# Patient Record
Sex: Female | Born: 1935
Health system: Southern US, Community
[De-identification: ages and names within clinical notes are randomized; demographics above are authoritative.]

## PROBLEM LIST (undated history)

## (undated) DIAGNOSIS — M79671 Pain in right foot: Secondary | ICD-10-CM

## (undated) DIAGNOSIS — M25511 Pain in right shoulder: Secondary | ICD-10-CM

## (undated) DIAGNOSIS — R413 Other amnesia: Secondary | ICD-10-CM

## (undated) DIAGNOSIS — H409 Unspecified glaucoma: Secondary | ICD-10-CM

## (undated) DIAGNOSIS — M129 Arthropathy, unspecified: Secondary | ICD-10-CM

## (undated) DIAGNOSIS — M1990 Unspecified osteoarthritis, unspecified site: Secondary | ICD-10-CM

## (undated) DIAGNOSIS — Z8781 Personal history of (healed) traumatic fracture: Secondary | ICD-10-CM

## (undated) DIAGNOSIS — S72009A Fracture of unspecified part of neck of unspecified femur, initial encounter for closed fracture: Secondary | ICD-10-CM

## (undated) DIAGNOSIS — R2689 Other abnormalities of gait and mobility: Secondary | ICD-10-CM

## (undated) DIAGNOSIS — R51 Headache: Secondary | ICD-10-CM

## (undated) DIAGNOSIS — M810 Age-related osteoporosis without current pathological fracture: Secondary | ICD-10-CM

## (undated) DIAGNOSIS — K759 Inflammatory liver disease, unspecified: Secondary | ICD-10-CM

## (undated) DIAGNOSIS — N950 Postmenopausal bleeding: Secondary | ICD-10-CM

## (undated) DIAGNOSIS — N181 Chronic kidney disease, stage 1: Secondary | ICD-10-CM

## (undated) DIAGNOSIS — R251 Tremor, unspecified: Secondary | ICD-10-CM

## (undated) DIAGNOSIS — C801 Malignant (primary) neoplasm, unspecified: Secondary | ICD-10-CM

## (undated) DIAGNOSIS — S72001S Fracture of unspecified part of neck of right femur, sequela: Secondary | ICD-10-CM

## (undated) DIAGNOSIS — M25562 Pain in left knee: Secondary | ICD-10-CM

## (undated) DIAGNOSIS — R296 Repeated falls: Secondary | ICD-10-CM

## (undated) HISTORY — DX: Postmenopausal bleeding: N95.0

## (undated) HISTORY — DX: Pain in left knee: M25.562

## (undated) HISTORY — DX: Other amnesia: R41.3

## (undated) HISTORY — DX: Personal history of (healed) traumatic fracture: Z87.81

## (undated) HISTORY — DX: Fracture of unspecified part of neck of right femur, sequela: S72.001S

## (undated) HISTORY — DX: Other abnormalities of gait and mobility: R26.89

## (undated) HISTORY — DX: Age-related osteoporosis without current pathological fracture: M81.0

## (undated) HISTORY — DX: Repeated falls: R29.6

## (undated) HISTORY — DX: Inflammatory liver disease, unspecified: K75.9

## (undated) HISTORY — DX: Fracture of unspecified part of neck of unspecified femur, initial encounter for closed fracture: S72.009A

## (undated) HISTORY — DX: Unspecified glaucoma: H40.9

## (undated) HISTORY — DX: Arthropathy, unspecified: M12.9

## (undated) HISTORY — DX: Unspecified osteoarthritis, unspecified site: M19.90

## (undated) HISTORY — DX: Chronic kidney disease, stage 1: N18.1

## (undated) HISTORY — DX: Pain in right foot: M79.671

## (undated) HISTORY — DX: Headache: R51

## (undated) HISTORY — PX: HIP FRACTURE SURGERY: SHX118

## (undated) HISTORY — PX: DILATION AND CURETTAGE OF UTERUS: SHX78

## (undated) HISTORY — DX: Pain in right shoulder: M25.511

## (undated) HISTORY — DX: Tremor, unspecified: R25.1

## (undated) HISTORY — PX: ABDOMINAL HYSTERECTOMY: SHX81

---

## 2003-12-02 ENCOUNTER — Ambulatory Visit (HOSPITAL_COMMUNITY): Admission: RE | Admit: 2003-12-02 | Discharge: 2003-12-02 | Payer: Self-pay | Admitting: Pulmonary Disease

## 2003-12-09 ENCOUNTER — Ambulatory Visit (HOSPITAL_COMMUNITY): Admission: RE | Admit: 2003-12-09 | Discharge: 2003-12-09 | Payer: Self-pay | Admitting: Pulmonary Disease

## 2005-02-09 ENCOUNTER — Ambulatory Visit (HOSPITAL_COMMUNITY): Admission: RE | Admit: 2005-02-09 | Discharge: 2005-02-09 | Payer: Self-pay | Admitting: Internal Medicine

## 2005-05-07 ENCOUNTER — Encounter: Payer: Self-pay | Admitting: Obstetrics and Gynecology

## 2005-05-07 ENCOUNTER — Inpatient Hospital Stay (HOSPITAL_COMMUNITY): Admission: RE | Admit: 2005-05-07 | Discharge: 2005-05-09 | Payer: Self-pay | Admitting: Obstetrics and Gynecology

## 2005-09-10 HISTORY — PX: ABDOMINAL HYSTERECTOMY: SHX81

## 2006-04-22 ENCOUNTER — Ambulatory Visit (HOSPITAL_COMMUNITY): Admission: RE | Admit: 2006-04-22 | Discharge: 2006-04-22 | Payer: Self-pay | Admitting: Obstetrics and Gynecology

## 2006-07-31 ENCOUNTER — Ambulatory Visit (HOSPITAL_COMMUNITY): Admission: RE | Admit: 2006-07-31 | Discharge: 2006-07-31 | Payer: Self-pay | Admitting: Internal Medicine

## 2006-07-31 ENCOUNTER — Ambulatory Visit: Payer: Self-pay | Admitting: Internal Medicine

## 2006-07-31 HISTORY — PX: COLONOSCOPY: SHX174

## 2006-07-31 LAB — HM COLONOSCOPY

## 2006-11-28 ENCOUNTER — Ambulatory Visit (HOSPITAL_COMMUNITY): Admission: RE | Admit: 2006-11-28 | Discharge: 2006-11-28 | Payer: Self-pay | Admitting: Ophthalmology

## 2007-02-12 ENCOUNTER — Ambulatory Visit (HOSPITAL_COMMUNITY): Admission: RE | Admit: 2007-02-12 | Discharge: 2007-02-12 | Payer: Self-pay | Admitting: Pulmonary Disease

## 2007-02-14 ENCOUNTER — Ambulatory Visit: Payer: Self-pay | Admitting: Internal Medicine

## 2007-10-08 ENCOUNTER — Ambulatory Visit (HOSPITAL_COMMUNITY): Admission: RE | Admit: 2007-10-08 | Discharge: 2007-10-08 | Payer: Self-pay | Admitting: Obstetrics and Gynecology

## 2008-10-05 ENCOUNTER — Other Ambulatory Visit: Admission: RE | Admit: 2008-10-05 | Discharge: 2008-10-05 | Payer: Self-pay | Admitting: Obstetrics and Gynecology

## 2009-08-11 ENCOUNTER — Ambulatory Visit (HOSPITAL_COMMUNITY): Admission: RE | Admit: 2009-08-11 | Discharge: 2009-08-11 | Payer: Self-pay | Admitting: Ophthalmology

## 2009-10-19 ENCOUNTER — Other Ambulatory Visit: Admission: RE | Admit: 2009-10-19 | Discharge: 2009-10-19 | Payer: Self-pay | Admitting: Obstetrics and Gynecology

## 2010-11-08 ENCOUNTER — Other Ambulatory Visit: Payer: Self-pay | Admitting: Obstetrics and Gynecology

## 2010-11-08 DIAGNOSIS — N6459 Other signs and symptoms in breast: Secondary | ICD-10-CM

## 2010-11-08 DIAGNOSIS — Z124 Encounter for screening for malignant neoplasm of cervix: Secondary | ICD-10-CM | POA: Insufficient documentation

## 2010-11-15 ENCOUNTER — Ambulatory Visit (HOSPITAL_COMMUNITY)
Admission: RE | Admit: 2010-11-15 | Discharge: 2010-11-15 | Disposition: A | Discharge status: Home / Self Care | Payer: Medicare Other | Source: Ambulatory Visit | Attending: Obstetrics and Gynecology | Admitting: Obstetrics and Gynecology

## 2010-11-15 ENCOUNTER — Other Ambulatory Visit: Payer: Self-pay | Admitting: Obstetrics and Gynecology

## 2010-11-15 DIAGNOSIS — N6459 Other signs and symptoms in breast: Secondary | ICD-10-CM

## 2010-11-15 DIAGNOSIS — N63 Unspecified lump in unspecified breast: Secondary | ICD-10-CM | POA: Insufficient documentation

## 2010-11-20 ENCOUNTER — Other Ambulatory Visit (HOSPITAL_COMMUNITY)
Admission: RE | Admit: 2010-11-20 | Discharge: 2010-11-20 | Disposition: A | Discharge status: Home / Self Care | Payer: Medicare Other | Source: Ambulatory Visit | Attending: Obstetrics and Gynecology | Admitting: Obstetrics and Gynecology

## 2010-12-08 ENCOUNTER — Ambulatory Visit (HOSPITAL_COMMUNITY)
Admission: RE | Admit: 2010-12-08 | Discharge: 2010-12-08 | Disposition: A | Discharge status: Home / Self Care | Payer: Medicare Other | Source: Ambulatory Visit | Attending: Pulmonary Disease | Admitting: Pulmonary Disease

## 2010-12-08 ENCOUNTER — Other Ambulatory Visit (HOSPITAL_COMMUNITY): Payer: Self-pay | Admitting: Pulmonary Disease

## 2010-12-08 DIAGNOSIS — M25569 Pain in unspecified knee: Secondary | ICD-10-CM | POA: Insufficient documentation

## 2010-12-08 DIAGNOSIS — M25561 Pain in right knee: Secondary | ICD-10-CM

## 2010-12-08 DIAGNOSIS — M25562 Pain in left knee: Secondary | ICD-10-CM

## 2010-12-08 DIAGNOSIS — M25469 Effusion, unspecified knee: Secondary | ICD-10-CM | POA: Insufficient documentation

## 2010-12-13 LAB — BASIC METABOLIC PANEL
BUN: 10 mg/dL (ref 6–23)
CO2: 31 mEq/L (ref 19–32)
Calcium: 10.1 mg/dL (ref 8.4–10.5)
Chloride: 107 mEq/L (ref 96–112)
Creatinine, Ser: 0.77 mg/dL (ref 0.4–1.2)
GFR calc Af Amer: >60 mL/min (ref >60)
GFR calc non Af Amer: >60 mL/min (ref >60)
Glucose, Bld: 86 mg/dL (ref 70–99)
Potassium: 3.9 mEq/L (ref 3.5–5.1)
Sodium: 143 mEq/L (ref 135–145)

## 2010-12-13 LAB — HEMOGLOBIN AND HEMATOCRIT, BLOOD
HCT: 43 % (ref 36.0–46.0)
Hemoglobin: 14.6 g/dL (ref 12.0–15.0)

## 2011-01-26 NOTE — Discharge Summary (Signed)
[name], Brittany Mahoney NO.:  [id number]   MEDICAL RECORD NO.:  [id number]          PATIENT TYPE:  INP   LOCATION:  A426                           FACILITY:   PHYSICIAN:  Tilda Burrow, M.D. DATE OF BIRTH:  1934/11/20   DATE OF ADMISSION:  05/07/2005  DATE OF DISCHARGE:  08/30/2006LH                                 DISCHARGE SUMMARY   ADMITTING DIAGNOSIS:  Well-differentiated adenocarcinoma of the endometrium,  FIGO grade I/III   DISCHARGE DIAGNOSIS:  Well-differentiated adenocarcinoma of the endometrium,  FIGO grade I/III, final pathology pending.   PROCEDURE:  Total abdominal hysterectomy, bilateral salpingo-oophorectomy,  pelvic washings performed on May 07, 2005.   DISCHARGE MEDICATIONS:  Aleve 1 every 6 hours p.r.n. discomfort.   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  See HPI for details.   HOSPITAL COURSE:  The patient was admitted, underwent TAH/BSO with 250 mL  blood loss.  Preop hemoglobin was 14.2, hematocrit 40.8, white count 7000.  Postop hemoglobin was 13, hematocrit 37.2.  She was afebrile.  She required  minimal analgesics, had a regular diet tolerated on the first postop day and  was discharged on the second postoperative day with final pathology pending  and postop visit May 15, 2005 for an incision check.  Addendum: Pathology shows 2 MM of cancer on the surface of the myometrium at  a uterine wall thickness of 1.2 CM (innner 1/3 only).      Tilda Burrow, M.D.  Electronically Signed     JVF/MEDQ  D:  05/09/2005  T:  05/09/2005  Job:  [id number]

## 2011-01-26 NOTE — Op Note (Signed)
Brittany Mahoney, Brittany Mahoney              ACCOUNT NO.:  [id number]   MEDICAL RECORD NO.:  [id number]          PATIENT TYPE:  INP   LOCATION:  A426                          FACILITY:  [facility]   PHYSICIAN:  Tilda Burrow, M.D. DATE OF BIRTH:  06/05/1935   DATE OF PROCEDURE:  05/07/2005  DATE OF DISCHARGE:                                 OPERATIVE REPORT   PREOPERATIVE DIAGNOSIS:  Endometrial carcinoma, FIGO grade 1.   POSTOPERATIVE DIAGNOSES:  1.  Endometrial carcinoma, FIGO grade 1.  2.  Old endometriosis.  3.  Pelvic adhesions.   PROCEDURE:  Total abdominal hysterectomy with bilateral salpingo-  oophorectomy, pelvic washings.   SURGEON:  Tilda Burrow, M.D.   ASSISTANTMarlinda Mike, R.N.   ANESTHESIA:  General, Garner Nash, C.R.N.A.   COMPLICATIONS:  None.   FINDINGS:  Dense adherence of the right ovary to the right pelvic side wall,  just adjacent to the ureter. Sigmoid adhesion in the cul-de-sac to the right  side of the midline. Right ovarian chocolate cyst consistent with old burned  out endometriosis. Normal pelvic structures. Otherwise, no evidence of  nodularity or suspension of metastatic disease.   DETAILS OF PROCEDURE:  The patient was taken to the operating room, prepped  and draped in the usual fashion for lower abdominal surgery with midline  vertical incision performed. We excised approximately a 2-inch wide ellipse  of skin and fatty tissue to optimize incision reapproximation. Peritoneal  cavity was entered through the midline incision with fascia elevated and  split with Mayo scissors, preperitoneal fat identified, peritoneal grasped  with two Hemostats and opened carefully without suspicion of injury to  internal organs. Peritoneal cavity was opened sufficiently to do pelvic  washings. This was followed by packing the bowel away and inspection of the  pelvis. Palpation of the upper abdomen had been performed and showed normal  liver surfaces, smooth omentum, with a  couple of old stringy bits of  fibrosis suggesting old infection, with no nodularity or suspicion of tumor  nodule. Bowel was grossly normal to surface appearances. Attention was then  directed to the pelvis and the round ligaments were taken down bilaterally.  Inspection of the cul-de-sac showed the cul-de-sac to be essentially  obliterated with a combination of sigmoid adhesions to the right side and  the right ovary having what appeared to be a burned out endometrioma and  adhesions to the side wide. We entered the retroperitoneum on the left,  isolated the infundibulopelvic ligament, palpated the ureter by banjo-string  technique, and stayed away from it while taking down the IP ligament.  Uterine vessels were able to be skeletonized and clamped with curved Heaney  clamp, back clamped with Kelly clamp, transected and suture ligated. The  bladder flap was developed anteriorly and attention directed to the right  side. Upon entering the retroperitoneum on the right, we were able to  palpate the ureter, and it was pulled up toward the attachment of the ovary  to the side wall. We therefore used Kidner dissectors to carefully peel the  ovary off of the side wall, and it went  quite well. The distance from the  ureter was greater than 1 cm at that point, so we were able to isolate the  IP ligament, clamp, cut, and suture ligate it. The curved Heaney clamp was  placed across the uterine vessels on this side with transection and suture  ligation. The sigmoid colon was adherent up to the backside of the inferior  aspects of the cardinal ligaments. We had to take this down, being careful  to not disrupt the surface of bowel, and this was successful. We were able  to clamp, cut, and suture ligate down to the upper and lower cardinal  ligaments on this side as well. At this time, we could palpate that we were  free enough on the bladder flap to make an anterior stab incision in the  cervical  vaginal fornix and amputate the cervix off of the vaginal cuff.  Aldrich stitches were placed at each lateral vaginal angle, the sigmoid  colon inspected, and no suspicion of disruption was noted. Cuff was closed  using a series of interrupted sutures front to back, with good tissue edge  approximation; we required an additional suture or two for hemostasis as the  cuff had a tendency to ooze just to the right of the midline. The cuff was  irrigated and inspected and found satisfactorily hemostatic. Laparotomy  equipment was removed, and sponge and tapes removed, anterior peritoneum  closed using 2-0 chromic. The fascia closed with continuous running PDS and  subcu tissue was reapproximated with interrupted 2-0 plain followed by  staple closure of the skin with good tissue edge approximation. Sponge and  needle counts were correct throughout.      Tilda Burrow, M.D.  Electronically Signed     JVF/MEDQ  D:  05/07/2005  T:  05/08/2005  Job:  [id number]

## 2011-01-26 NOTE — Procedures (Signed)
NAME:  Brittany Mahoney, Brittany Mahoney                           ACCOUNT NO.:  [id number]   MEDICAL RECORD NO.:  [id number]                   PATIENT TYPE:  OUT   LOCATION:  RAD                                  FACILITY:  [facility]   PHYSICIAN:  Railroad Bing, M.D.               DATE OF BIRTH:  [birth year]   DATE OF PROCEDURE:  DATE OF DISCHARGE:                                  ECHOCARDIOGRAM   REFERRING PHYSICIAN:  Dr. Ramon Dredge L. Hawkins.   CLINICAL DATA:  A 76 year old patient with signs and symptoms of congestive  heart failure.   M-MODE:  1. Aorta 3.3.  2. Left atrium 3.4.  3. Septum 1.4.  4. Posterior wall 1.4.  5. LV diastole 4.1.  6. LV systole 2.4.   FINDINGS:  1. Technically difficult and somewhat suboptimal echocardiographic study.  2. Left atrial size is at the upper limits of normal; normal right atrial     size.  3. Normal right ventricular size and function.  4. Normal tricuspid and mitral valve; mild mitral annular calcification.  5. Aortic valve is not well imaged; probably normal.  6. Normal inferior vena cava.  7. Normal internal dimension of the left ventricle; mild concentric left     ventricular hypertrophy.  8. Normal regional and global function.      ___________________________________________                                             Bing, M.D.   RR/MEDQ  D:  12/02/2003  T:  12/02/2003  Job:  [id number]

## 2011-01-26 NOTE — Op Note (Signed)
NAME:  Brittany Mahoney, Brittany Mahoney                 ACCOUNT NO.:  [id number]   MEDICAL RECORD NO.:  [id number]          PATIENT TYPE:  AMB   LOCATION:  DAY                           FACILITY:  [facility]   PHYSICIAN:  R. Roetta Sessions, M.D. DATE OF BIRTH:  27-Jun-1935   DATE OF PROCEDURE:  07/31/2006  DATE OF DISCHARGE:                                 OPERATIVE REPORT   PROCEDURE:  Screening colonoscopy.   INDICATIONS FOR PROCEDURE:  Patient is a 76 year old lady devoid of any  lower GI tract symptoms sent over courtesy of Dr. Christin Bach for  colorectal cancer screening.  She had colonoscopy some five or seven years  ago without significant findings (those records not available at this time).  However, just last year she underwent hysterectomy and was found to have  uterine carcinoma.  She had limited stage disease, not requiring any  additional therapy.  She is here as a high risk screen.  This approach has  discussed with Ms. Baum, potential risks, benefits and alternatives have  been reviewed, questions answered.  She is agreeable.  Please see  documentation in the medical record.   PROCEDURE NOTE:  O2 saturation, blood pressure, pulse and monitor the entire  procedure.   CONSCIOUS SEDATION:  Versed 3 mg IV, Demerol 75 mg IV   INSTRUMENT:  Olympus video chip system.   FINDINGS:  Digital rectal exam revealed no abnormalities.   ENDOSCOPIC FINDINGS:  The prep was good.  Rectum:  The colonoscope was  advanced from the rectosigmoid junction into the colon through the left,  transverse, right colon to area of appendiceal orifice, ileocecal valve and  cecum.  These structures well seen and photographed for the record.  From  this level, the scope was withdrawn.  Previously mentioned mucosal surfaces  were again seen.  A thorough examination of the rectal mucosa was undertaken  including a retroflex view of the anal verge.  The rectal mucosa appeared  normal.  The patient had extensive left-sided  diverticula.  Remainder of  colonic mucosa appeared normal, however.  The patient tolerated the  procedure well, was reacted in endoscopy.   IMPRESSION:  Normal rectum, left-sided diverticula.  Remainder colonic  mucosa appeared normal.   RECOMMENDATIONS:  1. Diverticulosis literature provided to Ms. Stanhope.  2. Repeat screening colonoscopy 5 years.      Jonathon Bellows, M.D.  Electronically Signed     RMR/MEDQ  D:  07/31/2006  T:  07/31/2006  Job:  [id number]   cc:   Tilda Burrow, M.D.  Fax: [phone]   Oneal Deputy. Juanetta Gosling, M.D.  Fax: [phone]

## 2011-01-26 NOTE — H&P (Signed)
Brittany Mahoney, Brittany Mahoney              ACCOUNT NO.:  [id number]   MEDICAL RECORD NO.:  [id number]          PATIENT TYPE:  AMB   LOCATION:  DAY                           FACILITY:  [facility]   PHYSICIAN:  Tilda Burrow, M.D. DATE OF BIRTH:  04-25-1935   DATE OF ADMISSION:  DATE OF DISCHARGE:  LH                                HISTORY & PHYSICAL   ADMISSION DIAGNOSIS:  Well-differentiated adenocarcinoma of the endometrium,  FIGO Grade I of III.   HISTORY OF PRESENT ILLNESS:  This 75 year old female employee of [employer] is admitted at this time for TAH-BSO and pelvic washings.  This is  read out as Grade I by Dr. Sherryll Burger in [place].  The case has been discussed  with Dr. De Blanch of [place]. As per Ms. Bergstresser's request,  the cancer surgery has been delayed.  In the interim, she has been managed  on Megace 40 mg t.i.d.  Plans are to proceed with hysterectomy, removal of  tubes and ovaries, and pelvic washings.   This evaluation began with postmenopausal bleeding noted in June where  ultrasound showed a slightly thickened, irregular endometrial layer and a  small 3.5 cm simple cystic mass in the right ovary with a normal CA125-20.  Bowel prep was planned for the surgery in case abnormal lesion or malignancy  of the ovary is identified.   PAST MEDICAL HISTORY:  Essentially benign other than migraines with her  periods years ago.   PAST SURGICAL HISTORY:  D&C in 1965 for GYN concerns.   ALLERGIES:  CODEINE, PENICILLIN, and MORPHINE SULFATE.   REVIEW OF SYSTEMS:  Negative for headaches, blurred vision, shortness of  breath, and chest pain.  She has normal bowel function.  She has mild urge  incontinence at times.   FAMILY HISTORY:  Positive for stroke, cancer.  Her father died of lymphatic  sarcoma.   PHYSICAL EXAMINATION:  VITAL SIGNS:  Height 5 feet 7-1/2 inches, weight 181.  Blood pressure 118/68.  Hemoglobin 14.2.  GENERAL:  She is a healthy-appearing  Caucasian female appropriate for age.  HEENT:  Pupils equal, round, and reactive.  Icteric.  Extraocular movements  intact.  NECK:  Supple.  CHEST:  Clear to auscultation.  ABDOMEN:  Soft, nontender, without mass or ascites.  PELVIC:  External genitalia normal for patient age with atrophic vaginal  tissue.  Pap smear recently Class I Feb 05, 2005.  The uterus was  anteflexed, 7 cm in maximum diameter on recent ultrasound.  Adnexa shows 3.5  cm simple cystic mass on the right ovary.   PLAN:  Total abdominal hysterectomy and bilateral salpingo-oophorectomy with  pelvic washings Monday, May 07, 2005. Frozen section on ovary.   The pros and cons of surgery have been discussed with patient with patient  having previously been made aware of my consultation with Dr. Katheren Shams-  Sharol Given, offered consultation with him prior to surgery or referral for  surgery.  She is comfortable with [place] to do this here.  Will proceed on  May 07, 2005.      Tilda Burrow, M.D.  Electronically Signed     JVF/MEDQ  D:  05/06/2005  T:  05/06/2005  Job:  [id number]

## 2011-06-11 ENCOUNTER — Encounter: Payer: Self-pay | Admitting: Internal Medicine

## 2011-09-19 ENCOUNTER — Other Ambulatory Visit (HOSPITAL_COMMUNITY): Payer: Self-pay | Admitting: Pulmonary Disease

## 2011-09-19 DIAGNOSIS — M545 Low back pain, unspecified: Secondary | ICD-10-CM | POA: Diagnosis not present

## 2011-09-19 DIAGNOSIS — M898X9 Other specified disorders of bone, unspecified site: Secondary | ICD-10-CM

## 2011-09-19 DIAGNOSIS — M79609 Pain in unspecified limb: Secondary | ICD-10-CM | POA: Diagnosis not present

## 2011-09-19 DIAGNOSIS — IMO0001 Reserved for inherently not codable concepts without codable children: Secondary | ICD-10-CM | POA: Diagnosis not present

## 2011-09-20 ENCOUNTER — Encounter (HOSPITAL_COMMUNITY)
Admission: RE | Admit: 2011-09-20 | Discharge: 2011-09-20 | Disposition: A | Discharge status: Home / Self Care | Payer: Medicare Other | Source: Ambulatory Visit | Attending: Pulmonary Disease | Admitting: Pulmonary Disease

## 2011-09-20 ENCOUNTER — Encounter (HOSPITAL_COMMUNITY): Payer: Self-pay

## 2011-09-20 DIAGNOSIS — R937 Abnormal findings on diagnostic imaging of other parts of musculoskeletal system: Secondary | ICD-10-CM | POA: Insufficient documentation

## 2011-09-20 DIAGNOSIS — M898X9 Other specified disorders of bone, unspecified site: Secondary | ICD-10-CM

## 2011-09-20 DIAGNOSIS — M1710 Unilateral primary osteoarthritis, unspecified knee: Secondary | ICD-10-CM | POA: Diagnosis not present

## 2011-09-20 DIAGNOSIS — IMO0002 Reserved for concepts with insufficient information to code with codable children: Secondary | ICD-10-CM | POA: Diagnosis not present

## 2011-09-20 DIAGNOSIS — M25569 Pain in unspecified knee: Secondary | ICD-10-CM | POA: Insufficient documentation

## 2011-09-20 DIAGNOSIS — H9209 Otalgia, unspecified ear: Secondary | ICD-10-CM | POA: Insufficient documentation

## 2011-09-20 HISTORY — DX: Malignant (primary) neoplasm, unspecified: C80.1

## 2011-09-20 MED ORDER — TECHNETIUM TC 99M MEDRONATE IV KIT
25.0000 | PACK | Freq: Once | INTRAVENOUS | Status: AC | PRN
  Administered 2011-09-20: 24 via INTRAVENOUS

## 2011-12-03 ENCOUNTER — Other Ambulatory Visit: Payer: Self-pay | Admitting: Obstetrics and Gynecology

## 2011-12-03 DIAGNOSIS — Z139 Encounter for screening, unspecified: Secondary | ICD-10-CM

## 2011-12-04 ENCOUNTER — Ambulatory Visit (HOSPITAL_COMMUNITY)
Admission: RE | Admit: 2011-12-04 | Discharge: 2011-12-04 | Disposition: A | Discharge status: Home / Self Care | Payer: Medicare Other | Source: Ambulatory Visit | Attending: Obstetrics and Gynecology | Admitting: Obstetrics and Gynecology

## 2011-12-04 DIAGNOSIS — Z1231 Encounter for screening mammogram for malignant neoplasm of breast: Secondary | ICD-10-CM | POA: Diagnosis not present

## 2011-12-04 DIAGNOSIS — Z139 Encounter for screening, unspecified: Secondary | ICD-10-CM

## 2012-01-09 DIAGNOSIS — H4011X0 Primary open-angle glaucoma, stage unspecified: Secondary | ICD-10-CM | POA: Diagnosis not present

## 2012-02-25 ENCOUNTER — Other Ambulatory Visit: Payer: Self-pay | Admitting: Obstetrics and Gynecology

## 2012-02-25 ENCOUNTER — Other Ambulatory Visit (HOSPITAL_COMMUNITY)
Admission: RE | Admit: 2012-02-25 | Discharge: 2012-02-25 | Disposition: A | Discharge status: Home / Self Care | Payer: Medicare Other | Source: Ambulatory Visit | Attending: Obstetrics and Gynecology | Admitting: Obstetrics and Gynecology

## 2012-02-25 DIAGNOSIS — Z1212 Encounter for screening for malignant neoplasm of rectum: Secondary | ICD-10-CM | POA: Diagnosis not present

## 2012-02-25 DIAGNOSIS — R319 Hematuria, unspecified: Secondary | ICD-10-CM | POA: Diagnosis not present

## 2012-02-25 DIAGNOSIS — Z124 Encounter for screening for malignant neoplasm of cervix: Secondary | ICD-10-CM | POA: Diagnosis not present

## 2012-06-05 DIAGNOSIS — Z23 Encounter for immunization: Secondary | ICD-10-CM | POA: Diagnosis not present

## 2012-06-13 DIAGNOSIS — H524 Presbyopia: Secondary | ICD-10-CM | POA: Diagnosis not present

## 2012-06-13 DIAGNOSIS — H4011X0 Primary open-angle glaucoma, stage unspecified: Secondary | ICD-10-CM | POA: Diagnosis not present

## 2012-06-13 DIAGNOSIS — Z0100 Encounter for examination of eyes and vision without abnormal findings: Secondary | ICD-10-CM | POA: Diagnosis not present

## 2012-06-13 DIAGNOSIS — H52229 Regular astigmatism, unspecified eye: Secondary | ICD-10-CM | POA: Diagnosis not present

## 2012-07-14 DIAGNOSIS — H4011X0 Primary open-angle glaucoma, stage unspecified: Secondary | ICD-10-CM | POA: Diagnosis not present

## 2012-08-12 DIAGNOSIS — H4011X0 Primary open-angle glaucoma, stage unspecified: Secondary | ICD-10-CM | POA: Diagnosis not present

## 2012-12-16 DIAGNOSIS — H4011X0 Primary open-angle glaucoma, stage unspecified: Secondary | ICD-10-CM | POA: Diagnosis not present

## 2013-01-19 ENCOUNTER — Other Ambulatory Visit: Payer: Self-pay | Admitting: Obstetrics and Gynecology

## 2013-01-19 DIAGNOSIS — Z139 Encounter for screening, unspecified: Secondary | ICD-10-CM

## 2013-01-23 ENCOUNTER — Ambulatory Visit (HOSPITAL_COMMUNITY)
Admission: RE | Admit: 2013-01-23 | Discharge: 2013-01-23 | Disposition: A | Discharge status: Home / Self Care | Payer: Medicare Other | Source: Ambulatory Visit | Attending: Obstetrics and Gynecology | Admitting: Obstetrics and Gynecology

## 2013-01-23 DIAGNOSIS — Z1231 Encounter for screening mammogram for malignant neoplasm of breast: Secondary | ICD-10-CM | POA: Diagnosis not present

## 2013-01-23 DIAGNOSIS — Z139 Encounter for screening, unspecified: Secondary | ICD-10-CM

## 2013-02-26 ENCOUNTER — Other Ambulatory Visit: Payer: Self-pay | Admitting: Obstetrics and Gynecology

## 2013-03-05 ENCOUNTER — Encounter: Payer: Self-pay | Admitting: *Deleted

## 2013-03-06 ENCOUNTER — Encounter: Payer: Self-pay | Admitting: Obstetrics and Gynecology

## 2013-03-06 ENCOUNTER — Other Ambulatory Visit (HOSPITAL_COMMUNITY)
Admission: RE | Admit: 2013-03-06 | Discharge: 2013-03-06 | Disposition: A | Discharge status: Home / Self Care | Payer: Medicare Other | Source: Ambulatory Visit | Attending: Obstetrics and Gynecology | Admitting: Obstetrics and Gynecology

## 2013-03-06 ENCOUNTER — Ambulatory Visit (INDEPENDENT_AMBULATORY_CARE_PROVIDER_SITE_OTHER): Payer: Medicare Other | Admitting: Obstetrics and Gynecology

## 2013-03-06 VITALS — BP 136/82 | Ht 66.0 in | Wt 170.6 lb

## 2013-03-06 DIAGNOSIS — Z1151 Encounter for screening for human papillomavirus (HPV): Secondary | ICD-10-CM | POA: Diagnosis not present

## 2013-03-06 DIAGNOSIS — Z1212 Encounter for screening for malignant neoplasm of rectum: Secondary | ICD-10-CM

## 2013-03-06 DIAGNOSIS — Z8542 Personal history of malignant neoplasm of other parts of uterus: Secondary | ICD-10-CM | POA: Diagnosis not present

## 2013-03-06 DIAGNOSIS — Z90710 Acquired absence of both cervix and uterus: Secondary | ICD-10-CM

## 2013-03-06 DIAGNOSIS — Z01419 Encounter for gynecological examination (general) (routine) without abnormal findings: Secondary | ICD-10-CM | POA: Insufficient documentation

## 2013-03-06 DIAGNOSIS — Z124 Encounter for screening for malignant neoplasm of cervix: Secondary | ICD-10-CM

## 2013-03-06 HISTORY — DX: Personal history of malignant neoplasm of other parts of uterus: Z85.42

## 2013-03-06 NOTE — Patient Instructions (Addendum)
ANNUAL VISITS ARE ALL THAT IS NEEDED

## 2013-03-06 NOTE — Progress Notes (Signed)
Patient ID: NEDA WILLENBRING, female   DOB: 1935-07-27, 78 y.o.   MRN: [id number] Pt here today for Annual Physical. Pt denies any problems at this time.   Subjective:     DONIESHA LANDAU is a 78 y.o. female here for a routine exam.  Current complaints: .  Personal health questionnaire reviewed: no.   Gynecologic History No LMP recorded. Patient has had a hysterectomy. Contraception: post menopausal status Last Pap: ANNUAL. Results were: normal Last mammogram: 2013 . Results were: normal  Obstetric History OB History   Grav Para Term Preterm Abortions TAB SAB Ect Mult Living   3 2   1  1         # Outc Date GA Lbr Len/2nd Wgt Sex Del Anes PTL Lv   1 PAR            2 PAR            3 SAB                The following portions of the patient's history were reviewed and updated as appropriate: allergies, current medications, past family history, past medical history, past social history, past surgical history and problem list.  Review of Systems  Review of Systems  Constitutional: Negative for fever, chills, weight loss, malaise/fatigue and diaphoresis.  HENT: Negative for hearing loss, ear pain, nosebleeds, congestion, sore throat, neck pain, tinnitus and ear discharge.   Eyes: Negative for blurred vision, double vision, photophobia, pain, discharge and redness.  Respiratory: Negative for cough, hemoptysis, sputum production, shortness of breath, wheezing and stridor.   Cardiovascular: Negative for chest pain, palpitations, orthopnea, claudication, leg swelling and PND.  Gastrointestinal: negative for abdominal pain. Negative for heartburn, nausea, vomiting, diarrhea, constipation, blood in stool and melena.  Genitourinary: Negative for dysuria, urgency, frequency, hematuria and flank pain.  Musculoskeletal: Negative for myalgias, back pain, joint pain and falls.  Skin: Negative for itching and rash.  Neurological: Negative for dizziness, tingling, tremors, sensory change, speech change,  focal weakness, seizures, loss of consciousness, weakness and headaches.  Endo/Heme/Allergies: Negative for environmental allergies and polydipsia. Does not bruise/bleed easily.  Psychiatric/Behavioral: Negative for depression, suicidal ideas, hallucinations, memory loss and substance abuse. The patient is not nervous/anxious and does not have insomnia.        Objective:    Physical Exam  Vitals reviewed. Constitutional: She is oriented to person, place, and time. She appears well-developed and well-nourished.  HENT:  Head: Normocephalic and atraumatic.        Right Ear: External ear normal.  Left Ear: External ear normal.  Nose: Nose normal.  Mouth/Throat: Oropharynx is clear and moist.  Eyes: Conjunctivae and EOM are normal. Pupils are equal, round, and reactive to light. Right eye exhibits no discharge. Left eye exhibits no discharge. No scleral icterus.  Neck: Normal range of motion. Neck supple. No tracheal deviation present. No thyromegaly present.  Cardiovascular: Normal rate, regular rhythm, normal heart sounds and intact distal pulses.  Exam reveals no gallop and no friction rub.   No murmur heard. Respiratory: Effort normal and breath sounds normal. No respiratory distress. She has no wheezes. She has no rales. She exhibits no tenderness.  GI: Soft. Bowel sounds are normal. She exhibits no distension and no mass. There is no tenderness. There is no rebound and no guarding.  Genitourinary: atrophic c/w age good support      Vulva is normal without lesions Vagina is pink moist without discharge  Musculoskeletal: Normal  range of motion. She exhibits no edema and no tenderness.  Neurological: She is alert and oriented to person, place, and time. She has normal reflexes. She displays normal reflexes. No cranial nerve deficit. She exhibits normal muscle tone. Coordination normal.  Skin: Skin is warm and dry. No rash noted. No erythema. No pallor. several comedones opened on back has  had 2 AK spont resolve. Psychiatric: She has a normal mood and affect. Her behavior is normal. Judgment and thought content normal.       Assessment:    Healthy female exam.  s/p  IA GRADE I CARCINOMA OF ENDOMETRIUM WITH NEG FOLLOWUP TODATE   Plan:    Follow up in: 1 year.

## 2013-03-12 ENCOUNTER — Encounter: Payer: Self-pay | Admitting: Obstetrics and Gynecology

## 2013-05-26 ENCOUNTER — Other Ambulatory Visit (HOSPITAL_COMMUNITY): Payer: Self-pay | Admitting: Pulmonary Disease

## 2013-05-26 DIAGNOSIS — Z780 Asymptomatic menopausal state: Secondary | ICD-10-CM

## 2013-05-26 DIAGNOSIS — M1990 Unspecified osteoarthritis, unspecified site: Secondary | ICD-10-CM

## 2013-05-26 DIAGNOSIS — Z0000 Encounter for general adult medical examination without abnormal findings: Secondary | ICD-10-CM | POA: Diagnosis not present

## 2013-05-29 DIAGNOSIS — Z79899 Other long term (current) drug therapy: Secondary | ICD-10-CM | POA: Diagnosis not present

## 2013-05-29 DIAGNOSIS — Z0000 Encounter for general adult medical examination without abnormal findings: Secondary | ICD-10-CM | POA: Diagnosis not present

## 2013-05-29 DIAGNOSIS — M129 Arthropathy, unspecified: Secondary | ICD-10-CM | POA: Diagnosis not present

## 2013-06-01 ENCOUNTER — Other Ambulatory Visit (HOSPITAL_COMMUNITY): Payer: Medicare Other

## 2013-06-08 DIAGNOSIS — Z23 Encounter for immunization: Secondary | ICD-10-CM | POA: Diagnosis not present

## 2014-01-07 ENCOUNTER — Other Ambulatory Visit: Payer: Self-pay | Admitting: Obstetrics and Gynecology

## 2014-01-07 DIAGNOSIS — Z1231 Encounter for screening mammogram for malignant neoplasm of breast: Secondary | ICD-10-CM

## 2014-01-25 ENCOUNTER — Ambulatory Visit (HOSPITAL_COMMUNITY)
Admission: RE | Admit: 2014-01-25 | Discharge: 2014-01-25 | Disposition: A | Discharge status: Home / Self Care | Payer: Medicare Other | Source: Ambulatory Visit | Attending: Obstetrics and Gynecology | Admitting: Obstetrics and Gynecology

## 2014-01-25 DIAGNOSIS — Z1231 Encounter for screening mammogram for malignant neoplasm of breast: Secondary | ICD-10-CM | POA: Diagnosis not present

## 2014-03-08 ENCOUNTER — Other Ambulatory Visit: Payer: Medicare Other | Admitting: Obstetrics and Gynecology

## 2014-03-15 ENCOUNTER — Ambulatory Visit (INDEPENDENT_AMBULATORY_CARE_PROVIDER_SITE_OTHER): Payer: Medicare Other | Admitting: Obstetrics and Gynecology

## 2014-03-15 ENCOUNTER — Encounter: Payer: Self-pay | Admitting: Obstetrics and Gynecology

## 2014-03-15 ENCOUNTER — Other Ambulatory Visit (HOSPITAL_COMMUNITY)
Admission: RE | Admit: 2014-03-15 | Discharge: 2014-03-15 | Disposition: A | Discharge status: Home / Self Care | Payer: Medicare Other | Source: Ambulatory Visit | Attending: Obstetrics and Gynecology | Admitting: Obstetrics and Gynecology

## 2014-03-15 VITALS — BP 128/64 | Ht 66.0 in | Wt 160.0 lb

## 2014-03-15 DIAGNOSIS — Z1212 Encounter for screening for malignant neoplasm of rectum: Secondary | ICD-10-CM | POA: Diagnosis not present

## 2014-03-15 DIAGNOSIS — Z8542 Personal history of malignant neoplasm of other parts of uterus: Secondary | ICD-10-CM | POA: Diagnosis not present

## 2014-03-15 DIAGNOSIS — Z124 Encounter for screening for malignant neoplasm of cervix: Secondary | ICD-10-CM | POA: Insufficient documentation

## 2014-03-15 DIAGNOSIS — Z01419 Encounter for gynecological examination (general) (routine) without abnormal findings: Secondary | ICD-10-CM | POA: Insufficient documentation

## 2014-03-15 LAB — HEMOCCULT GUIAC POC 1CARD (OFFICE): Fecal Occult Blood, POC: NEGATIVE

## 2014-03-15 NOTE — Progress Notes (Signed)
This chart was scribed by Ludger Nutting, Medical Scribe, for Dr. Mallory Shirk on 03/15/14 at 9:53 AM. This chart was reviewed by Dr. Mallory Shirk for accuracy.   Assessment:  Annual Gyn Exam  Hx Endometrial cancer   Plan:  1. pap smear done, next pap due 1 year 2. return annually or prn 3    Annual mammogram advised, normal Jan 15 2014 Subjective:  Brittany Mahoney is a 79 y.o. female G3P0010 who presents for annual exam. No LMP recorded. Patient has had a hysterectomy. The patient has complaints today of fatigue and decreased energy.   Patient reports having 7-8 falls over the last 1 year. Patient states she turns too quickly, causing her to lose her balance and fall backwards. She denies any serious injuries as a result of the falls. Pt advised to keep dr Luan Pulling aware of these issues weight stable.  The following portions of the patient's history were reviewed and updated as appropriate: allergies, current medications, past family history, past medical history, past social history, past surgical history and problem list.  Review of Systems Constitutional: negative except for fatigue and skin irritation to back  Gastrointestinal: negative Genitourinary: negative   Objective:  BP 128/64  Ht 5\' 6"  (1.676 m)  Wt 160 lb (72.576 kg)  BMI 25.84 kg/m2   BMI: Body mass index is 25.84 kg/(m^2).  General Appearance: Alert, appropriate appearance for age. No acute distress HEENT: Grossly normal Neck / Thyroid:  Cardiovascular: RRR; normal S1, S2, no murmur Lungs: CTA bilaterally Back: Multiple hyperkeratoses, nothing suspicious; No CVAT Breast Exam: No dimpling, nipple retraction or discharge. No masses or nodes., Normal to inspection, Normal breast tissue bilaterally and No masses or nodes.No dimpling, nipple retraction or discharge. Gastrointestinal: Soft, non-tender, no masses or organomegaly Pelvic Exam: External genitalia: normal general appearance Vaginal: normal mucosa without prolapse  or lesions, normal without tenderness, induration or masses and atrophic mucosa; well supported vaginal tissue, slightly atrophic, Cervix: removed surgically Adnexa: removed surgically Uterus: removed surgically Rectal: good sphincter tone, no masses and guaiac negative,  Rectovaginal: normal rectal, no masses and guaiac negative stool obtained Lymphatic Exam: Non-palpable nodes in neck, clavicular, axillary, or inguinal regions  Skin: no rash or abnormalities Neurologic: Normal gait and speech, no tremor  Psychiatric: Alert and oriented, appropriate affect.  Urinalysis:Not done  Mallory Shirk. MD Pgr [phone] 9:53 AM

## 2014-03-16 LAB — CYTOLOGY - PAP

## 2014-03-19 ENCOUNTER — Telehealth: Payer: Self-pay | Admitting: Obstetrics and Gynecology

## 2014-03-19 NOTE — Telephone Encounter (Signed)
Left msg that pt pap is normal.

## 2014-04-14 DIAGNOSIS — H4011X0 Primary open-angle glaucoma, stage unspecified: Secondary | ICD-10-CM | POA: Diagnosis not present

## 2014-04-14 DIAGNOSIS — H409 Unspecified glaucoma: Secondary | ICD-10-CM | POA: Diagnosis not present

## 2014-06-15 DIAGNOSIS — Z23 Encounter for immunization: Secondary | ICD-10-CM | POA: Diagnosis not present

## 2014-06-21 DIAGNOSIS — Z0000 Encounter for general adult medical examination without abnormal findings: Secondary | ICD-10-CM | POA: Diagnosis not present

## 2014-06-22 ENCOUNTER — Ambulatory Visit (HOSPITAL_COMMUNITY)
Admission: RE | Admit: 2014-06-22 | Discharge: 2014-06-22 | Disposition: A | Discharge status: Home / Self Care | Payer: Medicare Other | Source: Ambulatory Visit | Attending: Pulmonary Disease | Admitting: Pulmonary Disease

## 2014-06-22 ENCOUNTER — Other Ambulatory Visit (HOSPITAL_COMMUNITY): Payer: Self-pay | Admitting: Pulmonary Disease

## 2014-06-22 DIAGNOSIS — R079 Chest pain, unspecified: Secondary | ICD-10-CM

## 2014-06-22 DIAGNOSIS — M129 Arthropathy, unspecified: Secondary | ICD-10-CM | POA: Diagnosis not present

## 2014-06-22 DIAGNOSIS — R222 Localized swelling, mass and lump, trunk: Secondary | ICD-10-CM | POA: Diagnosis not present

## 2014-06-22 DIAGNOSIS — Z0000 Encounter for general adult medical examination without abnormal findings: Secondary | ICD-10-CM | POA: Diagnosis not present

## 2014-07-12 ENCOUNTER — Encounter: Payer: Self-pay | Admitting: Obstetrics and Gynecology

## 2014-10-15 DIAGNOSIS — H4011X2 Primary open-angle glaucoma, moderate stage: Secondary | ICD-10-CM | POA: Diagnosis not present

## 2015-03-07 ENCOUNTER — Other Ambulatory Visit: Payer: Self-pay | Admitting: Obstetrics and Gynecology

## 2015-03-07 DIAGNOSIS — Z1231 Encounter for screening mammogram for malignant neoplasm of breast: Secondary | ICD-10-CM

## 2015-03-10 ENCOUNTER — Ambulatory Visit (HOSPITAL_COMMUNITY)
Admission: RE | Admit: 2015-03-10 | Discharge: 2015-03-10 | Disposition: A | Discharge status: Home / Self Care | Payer: Medicare Other | Source: Ambulatory Visit | Attending: Obstetrics and Gynecology | Admitting: Obstetrics and Gynecology

## 2015-03-10 DIAGNOSIS — Z1231 Encounter for screening mammogram for malignant neoplasm of breast: Secondary | ICD-10-CM | POA: Insufficient documentation

## 2015-03-21 ENCOUNTER — Encounter: Payer: Self-pay | Admitting: Obstetrics and Gynecology

## 2015-03-21 ENCOUNTER — Other Ambulatory Visit (HOSPITAL_COMMUNITY)
Admission: RE | Admit: 2015-03-21 | Discharge: 2015-03-21 | Disposition: A | Discharge status: Home / Self Care | Payer: Medicare Other | Source: Ambulatory Visit | Attending: Obstetrics and Gynecology | Admitting: Obstetrics and Gynecology

## 2015-03-21 ENCOUNTER — Ambulatory Visit (INDEPENDENT_AMBULATORY_CARE_PROVIDER_SITE_OTHER): Payer: Medicare Other | Admitting: Obstetrics and Gynecology

## 2015-03-21 VITALS — BP 128/72 | Ht 66.5 in | Wt 152.0 lb

## 2015-03-21 DIAGNOSIS — Z1151 Encounter for screening for human papillomavirus (HPV): Secondary | ICD-10-CM | POA: Diagnosis not present

## 2015-03-21 DIAGNOSIS — Z8542 Personal history of malignant neoplasm of other parts of uterus: Secondary | ICD-10-CM | POA: Diagnosis not present

## 2015-03-21 DIAGNOSIS — C541 Malignant neoplasm of endometrium: Secondary | ICD-10-CM | POA: Insufficient documentation

## 2015-03-21 DIAGNOSIS — Z124 Encounter for screening for malignant neoplasm of cervix: Secondary | ICD-10-CM | POA: Diagnosis not present

## 2015-03-21 DIAGNOSIS — Z90710 Acquired absence of both cervix and uterus: Secondary | ICD-10-CM | POA: Insufficient documentation

## 2015-03-21 DIAGNOSIS — Z79899 Other long term (current) drug therapy: Secondary | ICD-10-CM | POA: Insufficient documentation

## 2015-03-21 DIAGNOSIS — N904 Leukoplakia of vulva: Secondary | ICD-10-CM

## 2015-03-21 HISTORY — DX: Malignant neoplasm of endometrium: C54.1

## 2015-03-21 MED ORDER — HYDROCORTISONE ACE-PRAMOXINE 1-1 % RE CREA: 1.0000 "application " | TOPICAL_CREAM | Freq: Two times a day (BID) | RECTAL | Status: DC

## 2015-03-21 NOTE — Progress Notes (Signed)
Will cancel rx

## 2015-03-21 NOTE — Progress Notes (Signed)
Patient ID: Brittany Mahoney, female   DOB: 1934-09-12, 80 y.o.   MRN: [id number]   Assessment:  Annual Gyn Exam  atrophic vulvar dytrophy S/p Ca endometrium 2008 IAGI with - followup Plan:  1. pap smear done, next pap due 2017    2. return annually or prn 3    Annual mammogram advised Topical neosporin to vulva.  Proctocream prn Subjective:  Brittany Mahoney is a 80 y.o. female, s/p abdominal hysterectomy and with a history of uterine cancer, G3P0010 who presents for annual exam. No LMP recorded. Patient has had a hysterectomy. The patient reports intermittent pressure incontinence that occurs with sneezing or laughing. She states mild vaginal burning as an associated symptom.  The following portions of the patient's history were reviewed and updated as appropriate: allergies, current medications, past family history, past medical history, past social history, past surgical history and problem list. Past Medical History  Diagnosis Date  . Headache(784.0)     migraines after periods  . PMB (postmenopausal bleeding)   . Hepatitis   . Cancer     uterus    Past Surgical History  Procedure Laterality Date  . Dilation and curettage of uterus    . Abdominal hysterectomy      TAH and BSO     Current outpatient prescriptions:  .  Cholecalciferol (VITAMIN D-3) 5000 UNITS TABS, Take 5,000 Units by mouth 3 (three) times daily., Disp: , Rfl:  .  Multiple Vitamins-Minerals (WOMENS 50+ MULTI VITAMIN/MIN) TABS, Take 1 tablet by mouth daily., Disp: , Rfl:  .  SUPER BIOTIN PO, Take 6,000 mcg by mouth daily., Disp: , Rfl:   Review of Systems Constitutional: negative Gastrointestinal: negative Genitourinary: negative  Objective:  BP 128/72 mmHg  Ht 5' 6.5" (1.689 m)  Wt 152 lb (68.947 kg)  BMI 24.17 kg/m2   BMI: Body mass index is 24.17 kg/(m^2).  General Appearance: Alert, appropriate appearance for age. No acute distress HEENT: Grossly normal Neck / Thyroid:  Cardiovascular: RRR; normal  S1, S2, no murmur Lungs: CTA bilaterally Back: No CVAT Breast Exam: Not indicated, pt had mammogram yesterday and declined. Gastrointestinal: Soft, non-tender, no masses or organomegaly Pelvic Exam: Vulva and vagina appear normal. Bimanual exam reveals normal uterus and adnexa. External genitalia: normal general appearance and some vulvic changes Vaginal: normal mucosa without prolapse or lesions and normal without tenderness, induration or masses Cervix: removed surgically Adnexa: removed surgically Uterus: removed surgically Rectovaginal: normal rectal, no masses and guaiac negative stool obtained Lymphatic Exam: Non-palpable nodes in neck, clavicular, axillary, or inguinal regions  Skin: no rash or abnormalities Neurologic: Normal gait and speech, no tremor  Psychiatric: Alert and oriented, appropriate affect.  Urinalysis:Not done  Hemoccult: Negative   Mallory Shirk. MD Pgr [phone] 10:00 AM   This chart was scribed for Jonnie Kind, MD by Tula Nakayama, ED Scribe. This patient was seen in room 1 and the patient's care was started at 10:00 AM.   I personally performed the services described in this documentation, which was SCRIBED in my presence. The recorded information has been reviewed and considered accurate. It has been edited as necessary during review. Jonnie Kind, MD

## 2015-03-21 NOTE — Progress Notes (Signed)
Patient ID: Brittany Mahoney, female   DOB: Jun 19, 1935, 80 y.o.   MRN: [id number] Pt her today for her annual exam. Pt denies any problems or concerns at this time.

## 2015-03-22 LAB — CYTOLOGY - PAP

## 2015-04-05 ENCOUNTER — Telehealth: Payer: Self-pay | Admitting: *Deleted

## 2015-04-05 NOTE — Telephone Encounter (Signed)
-----   Message from Jonnie Kind, MD sent at 03/24/2015  5:35 AM EDT ----- Normal pap continue annual exam due to hx cancer endometrium

## 2015-04-06 NOTE — Telephone Encounter (Signed)
Pt informed of WNL pap from 03/21/2015, repeat in 1 year. Pt also states [facility] did not get RX for Proctocream from 03/21/2015. Pt informed will call in to pharmacy.

## 2015-04-12 DIAGNOSIS — Z961 Presence of intraocular lens: Secondary | ICD-10-CM | POA: Diagnosis not present

## 2015-04-12 DIAGNOSIS — H4011X2 Primary open-angle glaucoma, moderate stage: Secondary | ICD-10-CM | POA: Diagnosis not present

## 2015-06-29 DIAGNOSIS — M129 Arthropathy, unspecified: Secondary | ICD-10-CM | POA: Diagnosis not present

## 2015-06-29 DIAGNOSIS — Z0000 Encounter for general adult medical examination without abnormal findings: Secondary | ICD-10-CM | POA: Diagnosis not present

## 2015-07-04 DIAGNOSIS — Z0000 Encounter for general adult medical examination without abnormal findings: Secondary | ICD-10-CM | POA: Diagnosis not present

## 2015-07-15 DIAGNOSIS — Z23 Encounter for immunization: Secondary | ICD-10-CM | POA: Diagnosis not present

## 2015-07-26 DIAGNOSIS — Z9849 Cataract extraction status, unspecified eye: Secondary | ICD-10-CM | POA: Diagnosis not present

## 2015-07-26 DIAGNOSIS — Z961 Presence of intraocular lens: Secondary | ICD-10-CM | POA: Diagnosis not present

## 2015-07-26 DIAGNOSIS — H401132 Primary open-angle glaucoma, bilateral, moderate stage: Secondary | ICD-10-CM | POA: Diagnosis not present

## 2016-02-10 ENCOUNTER — Other Ambulatory Visit: Payer: Self-pay | Admitting: Obstetrics and Gynecology

## 2016-02-10 DIAGNOSIS — Z1231 Encounter for screening mammogram for malignant neoplasm of breast: Secondary | ICD-10-CM

## 2016-02-22 DIAGNOSIS — H401132 Primary open-angle glaucoma, bilateral, moderate stage: Secondary | ICD-10-CM | POA: Diagnosis not present

## 2016-03-15 ENCOUNTER — Ambulatory Visit (HOSPITAL_COMMUNITY)
Admission: RE | Admit: 2016-03-15 | Discharge: 2016-03-15 | Disposition: A | Discharge status: Home / Self Care | Payer: Medicare Other | Source: Ambulatory Visit | Attending: Obstetrics and Gynecology | Admitting: Obstetrics and Gynecology

## 2016-03-15 DIAGNOSIS — Z1231 Encounter for screening mammogram for malignant neoplasm of breast: Secondary | ICD-10-CM

## 2016-03-22 ENCOUNTER — Other Ambulatory Visit (HOSPITAL_COMMUNITY)
Admission: RE | Admit: 2016-03-22 | Discharge: 2016-03-22 | Disposition: A | Discharge status: Home / Self Care | Payer: Medicare Other | Source: Ambulatory Visit | Attending: Obstetrics and Gynecology | Admitting: Obstetrics and Gynecology

## 2016-03-22 ENCOUNTER — Encounter: Payer: Self-pay | Admitting: Obstetrics and Gynecology

## 2016-03-22 ENCOUNTER — Ambulatory Visit (INDEPENDENT_AMBULATORY_CARE_PROVIDER_SITE_OTHER): Payer: Medicare Other | Admitting: Obstetrics and Gynecology

## 2016-03-22 VITALS — BP 130/60 | Ht 67.0 in | Wt 158.5 lb

## 2016-03-22 DIAGNOSIS — Z1151 Encounter for screening for human papillomavirus (HPV): Secondary | ICD-10-CM | POA: Insufficient documentation

## 2016-03-22 DIAGNOSIS — Z8542 Personal history of malignant neoplasm of other parts of uterus: Secondary | ICD-10-CM | POA: Diagnosis not present

## 2016-03-22 DIAGNOSIS — Z01411 Encounter for gynecological examination (general) (routine) with abnormal findings: Secondary | ICD-10-CM | POA: Insufficient documentation

## 2016-03-22 DIAGNOSIS — C541 Malignant neoplasm of endometrium: Secondary | ICD-10-CM

## 2016-03-22 DIAGNOSIS — Z90710 Acquired absence of both cervix and uterus: Secondary | ICD-10-CM

## 2016-03-22 NOTE — Progress Notes (Signed)
Patient ID: Brittany Mahoney, female   DOB: Dec 14, 1934, 81 y.o.   MRN: [id number]  Assessment:  Annual Gyn Exam   Plan:  1. pap smear done, next pap due 1 year  2. return annually or prn 3    Annual mammogram advised Subjective:  Brittany Mahoney is a 81 y.o. female G3P0010 who presents for annual exam. No LMP recorded. Patient has had a hysterectomy. Denies any complaints at this time. Pt states that she comes yearly for pap smears due to her hx of endometrium CA. Pt reports that she went for a mammogram a couple days ago. Pt notes that she has had a hysterectomy in 2007. Pt states that she has had a colonoscopy in the past. Pt has not tried any medications for her symptoms. Denies any other symptoms.   The following portions of the patient's history were reviewed and updated as appropriate: allergies, current medications, past family history, past medical history, past social history, past surgical history and problem list. Past Medical History  Diagnosis Date  . Headache(784.0)     migraines after periods  . PMB (postmenopausal bleeding)   . Hepatitis   . Cancer [facility])     uterus    Past Surgical History  Procedure Laterality Date  . Dilation and curettage of uterus    . Abdominal hysterectomy      TAH and BSO     Current outpatient prescriptions:  .  hydrocortisone cream 1 %, Apply 1 application topically 2 (two) times daily., Disp: , Rfl:  .  LUMIGAN 0.01 % SOLN, INSTILL 1 DROP IN INTO BOTH EYES AT BEDTIME, Disp: , Rfl: 6 .  SUPER BIOTIN PO, Take 6,000 mcg by mouth daily., Disp: , Rfl:   Review of Systems Constitutional: negative Gastrointestinal: negative Genitourinary: negative  Objective:  BP 130/60 mmHg  Ht 5\' 7"  (1.702 m)  Wt 158 lb 8 oz (71.895 kg)  BMI 24.82 kg/m2   BMI: Body mass index is 24.82 kg/(m^2).  General Appearance: Alert, appropriate appearance for age. No acute distress HEENT: Grossly normal Neck / Thyroid:  Cardiovascular: RRR; normal S1, S2, no  murmur Lungs: CTA bilaterally Back: No CVAT Breast Exam: No masses or nodes.No dimpling, nipple retraction or discharge. Gastrointestinal: Soft, non-tender, no masses or organomegaly Pelvic Exam: External genitalia: normal general appearance Vaginal: normal mucosa without prolapse or lesions, normal without tenderness, induration or masses, normal rugae and good support Cervix: removed surgically Adnexa: removed surgically Uterus: removed surgically Rectal: good sphincter tone, no masses and guaiac negative Rectovaginal: not indicated Lymphatic Exam: Non-palpable nodes in neck, clavicular, axillary, or inguinal regions  Skin: no rash or abnormalities Neurologic: Normal gait and speech, no tremor  Psychiatric: Alert and oriented, appropriate affect.  Urinalysis:Not done  Mallory Shirk. MD Pgr [phone] 10:44 AM   By signing my name below, I, Soijett Blue, attest that this documentation has been prepared under the direction and in the presence of Jonnie Kind, MD. Electronically Signed: Soijett Blue, ED Scribe. 03/22/2016. 10:44 AM.  I personally performed the services described in this documentation, which was SCRIBED in my presence. The recorded information has been reviewed and considered accurate. It has been edited as necessary during review. Jonnie Kind, MD   I personally performed the services described in this documentation, which was SCRIBED in my presence. The recorded information has been reviewed and considered accurate. It has been edited as necessary during review. Jonnie Kind, MD

## 2016-03-22 NOTE — Addendum Note (Signed)
Addended by: Farley Ly on: 03/22/2016 04:56 PM   Modules accepted: Orders

## 2016-03-26 LAB — CYTOLOGY - PAP

## 2016-03-28 ENCOUNTER — Telehealth: Payer: Self-pay | Admitting: Obstetrics and Gynecology

## 2016-03-28 NOTE — Telephone Encounter (Signed)
NORMAL RESULTS TO PT PER PHONE

## 2016-04-09 ENCOUNTER — Emergency Department (HOSPITAL_COMMUNITY): Payer: Medicare Other

## 2016-04-09 ENCOUNTER — Inpatient Hospital Stay (HOSPITAL_COMMUNITY)
Admission: EM | Admit: 2016-04-09 | Discharge: 2016-04-13 | DRG: 481 | Disposition: A | Discharge status: Skilled Nursing Facility | Payer: Medicare Other | Attending: Internal Medicine | Admitting: Internal Medicine

## 2016-04-09 ENCOUNTER — Encounter (HOSPITAL_COMMUNITY): Payer: Self-pay

## 2016-04-09 DIAGNOSIS — E876 Hypokalemia: Secondary | ICD-10-CM | POA: Diagnosis not present

## 2016-04-09 DIAGNOSIS — S72141A Displaced intertrochanteric fracture of right femur, initial encounter for closed fracture: Principal | ICD-10-CM | POA: Diagnosis present

## 2016-04-09 DIAGNOSIS — Z833 Family history of diabetes mellitus: Secondary | ICD-10-CM | POA: Diagnosis not present

## 2016-04-09 DIAGNOSIS — Z880 Allergy status to penicillin: Secondary | ICD-10-CM

## 2016-04-09 DIAGNOSIS — Z885 Allergy status to narcotic agent status: Secondary | ICD-10-CM | POA: Diagnosis not present

## 2016-04-09 DIAGNOSIS — N390 Urinary tract infection, site not specified: Secondary | ICD-10-CM | POA: Diagnosis present

## 2016-04-09 DIAGNOSIS — S72001A Fracture of unspecified part of neck of right femur, initial encounter for closed fracture: Secondary | ICD-10-CM | POA: Diagnosis not present

## 2016-04-09 DIAGNOSIS — Z8542 Personal history of malignant neoplasm of other parts of uterus: Secondary | ICD-10-CM

## 2016-04-09 DIAGNOSIS — W010XXA Fall on same level from slipping, tripping and stumbling without subsequent striking against object, initial encounter: Secondary | ICD-10-CM | POA: Diagnosis present

## 2016-04-09 DIAGNOSIS — Z8249 Family history of ischemic heart disease and other diseases of the circulatory system: Secondary | ICD-10-CM

## 2016-04-09 DIAGNOSIS — S72009A Fracture of unspecified part of neck of unspecified femur, initial encounter for closed fracture: Secondary | ICD-10-CM | POA: Diagnosis present

## 2016-04-09 DIAGNOSIS — R030 Elevated blood-pressure reading, without diagnosis of hypertension: Secondary | ICD-10-CM | POA: Diagnosis not present

## 2016-04-09 DIAGNOSIS — M25551 Pain in right hip: Secondary | ICD-10-CM | POA: Diagnosis not present

## 2016-04-09 DIAGNOSIS — Z823 Family history of stroke: Secondary | ICD-10-CM | POA: Diagnosis not present

## 2016-04-09 DIAGNOSIS — F419 Anxiety disorder, unspecified: Secondary | ICD-10-CM | POA: Diagnosis not present

## 2016-04-09 DIAGNOSIS — Z419 Encounter for procedure for purposes other than remedying health state, unspecified: Secondary | ICD-10-CM

## 2016-04-09 DIAGNOSIS — R52 Pain, unspecified: Secondary | ICD-10-CM | POA: Diagnosis not present

## 2016-04-09 DIAGNOSIS — S3992XA Unspecified injury of lower back, initial encounter: Secondary | ICD-10-CM | POA: Diagnosis not present

## 2016-04-09 DIAGNOSIS — Z01818 Encounter for other preprocedural examination: Secondary | ICD-10-CM | POA: Diagnosis not present

## 2016-04-09 HISTORY — DX: Fracture of unspecified part of neck of right femur, initial encounter for closed fracture: S72.001A

## 2016-04-09 LAB — URINALYSIS, ROUTINE W REFLEX MICROSCOPIC
Bilirubin Urine: NEGATIVE
Glucose, UA: NEGATIVE mg/dL
Ketones, ur: NEGATIVE mg/dL
Leukocytes, UA: NEGATIVE
Nitrite: POSITIVE — AB
Protein, ur: NEGATIVE mg/dL
Specific Gravity, Urine: 1.01 (ref 1.005–1.030)
pH: 6.5 (ref 5.0–8.0)

## 2016-04-09 LAB — CBC WITH DIFFERENTIAL/PLATELET
Basophils Absolute: 0.1 10*3/uL (ref 0.0–0.1)
Basophils Relative: 1 %
Eosinophils Absolute: 0.1 10*3/uL (ref 0.0–0.7)
Eosinophils Relative: 1 %
HCT: 39 % (ref 36.0–46.0)
Hemoglobin: 13.2 g/dL (ref 12.0–15.0)
Lymphocytes Relative: 24 %
Lymphs Abs: 2.3 10*3/uL (ref 0.7–4.0)
MCH: 30.6 pg (ref 26.0–34.0)
MCHC: 33.8 g/dL (ref 30.0–36.0)
MCV: 90.3 fL (ref 78.0–100.0)
Monocytes Absolute: 0.6 10*3/uL (ref 0.1–1.0)
Monocytes Relative: 7 %
Neutro Abs: 6.4 10*3/uL (ref 1.7–7.7)
Neutrophils Relative %: 67 %
Platelets: 258 10*3/uL (ref 150–400)
RBC: 4.32 MIL/uL (ref 3.87–5.11)
RDW: 12.3 % (ref 11.5–15.5)
WBC: 9.5 10*3/uL (ref 4.0–10.5)

## 2016-04-09 LAB — URINE MICROSCOPIC-ADD ON

## 2016-04-09 LAB — BASIC METABOLIC PANEL
Anion gap: 6 (ref 5–15)
BUN: 10 mg/dL (ref 6–20)
CO2: 24 mmol/L (ref 22–32)
Calcium: 9.3 mg/dL (ref 8.9–10.3)
Chloride: 107 mmol/L (ref 101–111)
Creatinine, Ser: 0.75 mg/dL (ref 0.44–1.00)
GFR calc Af Amer: >60 mL/min (ref >60)
GFR calc non Af Amer: >60 mL/min (ref >60)
Glucose, Bld: 102 mg/dL — ABNORMAL HIGH (ref 65–99)
Potassium: 3.2 mmol/L — ABNORMAL LOW (ref 3.5–5.1)
Sodium: 137 mmol/L (ref 135–145)

## 2016-04-09 LAB — TROPONIN I: Troponin I: <0.03 ng/mL (ref <0.03)

## 2016-04-09 LAB — PROTIME-INR
INR: 1
Prothrombin Time: 13.2 seconds (ref 11.4–15.2)

## 2016-04-09 MED ORDER — FENTANYL CITRATE (PF) 100 MCG/2ML IJ SOLN
6.5000 ug | Freq: Once | INTRAMUSCULAR | Status: AC
  Administered 2016-04-09: 6.5 ug via INTRAVENOUS
  Filled 2016-04-09: qty 2

## 2016-04-09 MED ORDER — POTASSIUM CHLORIDE CRYS ER 20 MEQ PO TBCR
40.0000 meq | EXTENDED_RELEASE_TABLET | ORAL | Status: AC
Start: 2016-04-09 — End: 2016-04-10
  Administered 2016-04-10 (×2): 40 meq via ORAL
  Filled 2016-04-09 (×2): qty 2

## 2016-04-09 MED ORDER — ONDANSETRON HCL 4 MG/2ML IJ SOLN
4.0000 mg | INTRAMUSCULAR | Status: DC | PRN
  Administered 2016-04-09: 4 mg via INTRAVENOUS
  Filled 2016-04-09: qty 2

## 2016-04-09 MED ORDER — FENTANYL CITRATE (PF) 100 MCG/2ML IJ SOLN
6.5000 ug | INTRAMUSCULAR | Status: AC | PRN
  Administered 2016-04-09 (×2): 6.5 ug via INTRAVENOUS
  Filled 2016-04-09 (×2): qty 2

## 2016-04-09 MED ORDER — SODIUM CHLORIDE 0.9 % IV SOLN
INTRAVENOUS | Status: DC
  Administered 2016-04-09 – 2016-04-10 (×2): via INTRAVENOUS

## 2016-04-09 NOTE — H&P (Signed)
TRH H&P   Patient Demographics:    Brittany Mahoney, is a 81 y.o. female  MRN: [id number]  DOB - 02/08/1935  Admit Date - 04/09/2016  Outpatient Primary MD for the patient is Alonza Bogus, MD  Referring MD/NP/PA: Dr Elise Benne  Patient coming from: Home  Chief Complaint  Patient presents with  . Fall      HPI:    Brittany Mahoney  is a 81 y.o. female, With no significant medical problems, came to the hospital after a fall at home. As per patient, she lost her balance while she was trying to put trash in the golf cart. Patient fell on the ground and was unable to get up, she called for help and neighbors came to rescue after 45 minutes. EMS was called and patient transferred to ED. In the ED   x-ray of the hip showed right intertrochanteric fracture,  patient walks with transferred to [facility]: Follow-up surgery. She denies passing out, no chest pain or shortness of breath no nausea vomiting or diarrhea.   patient is able to walk without getting short of breath.    Review of systems:    In addition to the HPI above No Fever-chills, No Headache, No changes with Vision or hearing, No problems swallowing food or Liquids, No Chest pain, Cough or Shortness of Breath, No Abdominal pain, No Nausea or Vomiting, bowel movements are regular, No Blood in stool or Urine, No dysuria, No new skin rashes or bruises,At home by herself  No new weakness, tingling, numbness in any extremity, No recent weight gain or loss, No polyuria, polydypsia or polyphagia, No significant Mental Stressors.  A full 10 point Review of Systems was done, except as stated above, all other Review of Systems were negative.   With Past History of the following :    Past Medical History:  Diagnosis Date  . Cancer ([place])    uterus  . Headache(784.0)    migraines after periods  . Hepatitis   . PMB (postmenopausal bleeding)        Past Surgical History:  Procedure Laterality Date  . ABDOMINAL HYSTERECTOMY     TAH and BSO  . DILATION AND CURETTAGE OF UTERUS        Social History:     Social History  Substance Use Topics  . Smoking status: Never Smoker  . Smokeless tobacco: Never Used  . Alcohol use No     Lives - At home   Mobility -  no limitation in mobility      Family History :     Family History  Problem Relation Age of Onset  . Stroke Maternal Grandfather   . Stroke Paternal Grandfather   . Cancer Father     lymphatic sarcoma  . Early death Sister   . Early death Son   . Diabetes Son   . Heart disease Other   . Cancer Other   . Tuberculosis Other   . Diabetes Other   . Thyroid disease Other   .  Stroke Other   . Heart disease Maternal Aunt   . Heart disease Maternal Uncle       Home Medications:   Prior to Admission medications   Medication Sig Start Date End Date Taking? Authorizing Provider  hydrocortisone cream 1 % Apply 1 application topically 2 (two) times daily.    Historical Provider, MD  LUMIGAN 0.01 % SOLN INSTILL 1 DROP IN INTO BOTH EYES AT BEDTIME 02/07/16   Historical Provider, MD  SUPER BIOTIN PO Take 6,000 mcg by mouth daily.    Historical Provider, MD     Allergies:     Allergies  Allergen Reactions  . Codeine   . Morphine And Related     loopy  . Penicillins      Physical Exam:   Vitals  Blood pressure 161/86, pulse 91, temperature 97.4 F (36.3 C), temperature source Oral, resp. rate 16, height 5\' 6"  (1.676 m), weight 70.3 kg (155 lb), SpO2 100 %.   1. General Elderly Caucasian female* lying in bed in NAD, cooperative with exam   2. Normal affect and insight, Awake Alert, Oriented X 3.  3. No F.N deficits, ALL C.Nerves Intact, Strength 5/5 all 4 extremities, Sensation intact all 4 extremities, Plantars down going.  4. Ears and Eyes appear Normal, Conjunctivae clear, PERRLA. Moist Oral Mucosa.  5. Supple Neck, No JVD, No cervical  lymphadenopathy appriciated, No Carotid Bruits.  6. Symmetrical Chest wall movement, Good air movement bilaterally, CTAB.  7. RRR, No Gallops, Rubs or Murmurs, No Parasternal Heave.No Leg edema  8. Positive Bowel Sounds, Abdomen Soft, No tenderness, No organomegaly appriciated,No rebound -guarding or rigidity.  9.  No Cyanosis, Normal Skin Turgor, No Skin Rash or Bruise.  10. Right lower extremity externally rotated, right hip tender to palpation       Data Review:    CBC  Recent Labs Lab 04/09/16 2129  WBC 9.5  HGB 13.2  HCT 39.0  PLT 258  MCV 90.3  MCH 30.6  MCHC 33.8  RDW 12.3  LYMPHSABS 2.3  MONOABS 0.6  EOSABS 0.1  BASOSABS 0.1   ------------------------------------------------------------------------------------------------------------------  Chemistries   Recent Labs Lab 04/09/16 2129  NA 137  K 3.2*  CL 107  CO2 24  GLUCOSE 102*  BUN 10  CREATININE 0.75  CALCIUM 9.3   ------------------------------------------------------------------------------------------------------------------  ------------------------------------------------------------------------------------------------------------------ Coagulation Profile:  Recent Labs Lab 04/09/16 2129  INR 1.00   Cardiac Enzymes:  Recent Labs Lab 04/09/16 2129  TROPONINI <0.03    --------------------------------------------------------------------------------------------------------------- Urine analysis:    Component Value Date/Time   COLORURINE YELLOW 04/09/2016 2210   APPEARANCEUR CLEAR 04/09/2016 2210   LABSPEC 1.010 04/09/2016 2210   PHURINE 6.5 04/09/2016 2210   GLUCOSEU NEGATIVE 04/09/2016 2210   HGBUR SMALL (A) 04/09/2016 2210   BILIRUBINUR NEGATIVE 04/09/2016 2210   KETONESUR NEGATIVE 04/09/2016 2210   PROTEINUR NEGATIVE 04/09/2016 2210   NITRITE POSITIVE (A) 04/09/2016 2210   LEUKOCYTESUR NEGATIVE 04/09/2016 2210       ----------------------------------------------------------------------------------------------------------------   Imaging Results:    Dg Chest 1 View  Result Date: 04/09/2016 CLINICAL DATA:  Status post trip and fall tonight with a right hip fracture. Preoperative examination. EXAM: CHEST 1 VIEW COMPARISON:  PA and lateral chest 06/22/2014. FINDINGS: The lungs are clear. Heart size is normal. The aorta is tortuous. No pneumothorax or pleural effusion. No focal bony abnormality. Scoliosis noted. IMPRESSION: No acute disease. Electronically Signed   By: Inge Rise M.D.   On: 04/09/2016 21:10   Dg Hip  Unilat  With Pelvis 2-3 Views Right  Result Date: 04/09/2016 CLINICAL DATA:  Status post trip and fall tonight while taking garbage out. Right hip pain. Initial encounter. EXAM: DG HIP (WITH OR WITHOUT PELVIS) 2-3V RIGHT COMPARISON:  None. FINDINGS: The patient has an acute right intertrochanteric fracture. No other acute bony or joint abnormality is seen. Bones somewhat osteopenic. IMPRESSION: Acute right intertrochanteric fracture. Electronically Signed   By: Inge Rise M.D.   On: 04/09/2016 21:10    My personal review of EKG: Rhythm NSR   Assessment & Plan:    Active Problems:   Closed right hip fracture (HCC)   Hip fracture ([place])   1. Right intertrochanteric fracture- Dr. Thurnell Garbe called Ninfa Linden at [facility], who agrees for transferring patient for surgery tomorrow. Patient with possible [facility] hospital, initiate hip fracture protocol. 2. UTI- patient's UA shows positive nitrite, culture has been obtained. Will start empiric ciprofloxacin, follow urine culture results. 3. Hypokalemia- replace potassium and check BMP in a.m.   DVT Prophylaxis-   Lovenox   AM Labs Ordered, also please review Full Orders  Family Communication: Admission, patients condition and plan of care including tests being ordered have been discussed with the patient and her daughter at  bedside who indicate understanding and agree with the plan and Code Status.  Code Status: Full code  Admission status: Observation   Time spent in minutes : 55 min   Dyllan Hughett S M.D on 04/09/2016 at 10:54 PM  Between 7am to 7pm - Pager - [phone]. After 7pm go to [email or web address] - password [facility]  [facility] - Office  [phone]

## 2016-04-09 NOTE — ED Provider Notes (Signed)
[place] DEPT Provider Note   CSN: [id number] Arrival date & time: 04/09/16  2012  First Provider Contact:  None       History   Chief Complaint Chief Complaint  Patient presents with  . Fall    HPI Brittany Mahoney is a 81 y.o. female.  HPI  Pt was seen at 2025. Per pt, c/o sudden onset and resolution of one episode of slip and fall that occurred PTA. Pt states she fell directly onto her right hip. Pt denies any other injuries. Denies head injury, no neck or back pain, no tingling/numbness in extremities, no CP/SOB, no abd pain, no N/V/D, no syncope/near syncope.    Past Medical History:  Diagnosis Date  . Cancer ([place])    uterus  . Headache(784.0)    migraines after periods  . Hepatitis   . PMB (postmenopausal bleeding)     Patient Active Problem List   Diagnosis Date Noted  . 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  . Endometrial cancer, grade I ([place]) 03/21/2015  . Routine gynecological examination 03/15/2014  . Screening for malignant neoplasm of the cervix 03/15/2014  . Hx of cancer of endometrium 03/06/2013    Past Surgical History:  Procedure Laterality Date  . ABDOMINAL HYSTERECTOMY     TAH and BSO  . DILATION AND CURETTAGE OF UTERUS      OB History    Gravida Para Term Preterm AB Living   3 2     1      SAB TAB Ectopic Multiple Live Births   1               Home Medications    Prior to Admission medications   Medication Sig Start Date End Date Taking? Authorizing Provider  hydrocortisone cream 1 % Apply 1 application topically 2 (two) times daily.    Historical Provider, MD  LUMIGAN 0.01 % SOLN INSTILL 1 DROP IN INTO BOTH EYES AT BEDTIME 02/07/16   Historical Provider, MD  SUPER BIOTIN PO Take 6,000 mcg by mouth daily.    Historical Provider, MD    Family History Family History  Problem Relation Age of Onset  . Stroke Maternal Grandfather   . Stroke Paternal Grandfather   . Cancer Father     lymphatic sarcoma  . Early death Sister   . Early  death Son   . Diabetes Son   . Heart disease Other   . Cancer Other   . Tuberculosis Other   . Diabetes Other   . Thyroid disease Other   . Stroke Other   . Heart disease Maternal Aunt   . Heart disease Maternal Uncle     Social History Social History  Substance Use Topics  . Smoking status: Never Smoker  . Smokeless tobacco: Never Used  . Alcohol use No     Allergies   Codeine; Morphine and related; and Penicillins   Review of Systems Review of Systems ROS: Statement: All systems negative except as marked or noted in the HPI; Constitutional: Negative for fever and chills. ; ; Eyes: Negative for eye pain, redness and discharge. ; ; ENMT: Negative for ear pain, hoarseness, nasal congestion, sinus pressure and sore throat. ; ; Cardiovascular: Negative for chest pain, palpitations, diaphoresis, dyspnea and peripheral edema. ; ; Respiratory: Negative for cough, wheezing and stridor. ; ; Gastrointestinal: Negative for nausea, vomiting, diarrhea, abdominal pain, blood in stool, hematemesis, jaundice and rectal bleeding. . ; ; Genitourinary: Negative for dysuria, flank pain and hematuria. ; ; Musculoskeletal: +  right hip pain. Negative for back pain and neck pain. Negative for swelling.; ; Skin: Negative for pruritus, rash, abrasions, blisters, bruising and skin lesion.; ; Neuro: Negative for headache, lightheadedness and neck stiffness. Negative for weakness, altered level of consciousness, altered mental status, extremity weakness, paresthesias, involuntary movement, seizure and syncope.      Physical Exam Updated Vital Signs BP 165/95 (BP Location: Left Arm)   Pulse 96   Temp 97.4 F (36.3 C) (Oral)   Resp 16   Ht 5\' 6"  (1.676 m)   Wt 155 lb (70.3 kg)   SpO2 98%   BMI 25.02 kg/m   Physical Exam 2030: Physical examination:  Nursing notes reviewed; Vital signs and O2 SAT reviewed;  Constitutional: Well developed, Well nourished, Well hydrated, In no acute distress; Head:   Normocephalic, atraumatic; Eyes: EOMI, PERRL, No scleral icterus; ENMT: Mouth and pharynx normal, Mucous membranes moist; Neck: Supple, Full range of motion, No lymphadenopathy; Cardiovascular: Regular rate and rhythm, No gallop; Respiratory: Breath sounds clear & equal bilaterally, No wheezes.  Speaking full sentences with ease, Normal respiratory effort/excursion; Chest: Nontender, Movement normal; Abdomen: Soft, Nontender, Nondistended, Normal bowel sounds; Genitourinary: No CVA tenderness; Spine:  No midline CS, TS, LS tenderness.;; Extremities: Pulses normal, Pelvis stable. +right hip tenderness to palp. NT right knee/ankle/foot. Chronic ankles deformity. No edema, No calf edema or asymmetry.; Neuro: AA&Ox3, Major CN grossly intact.  Speech clear. No gross focal motor or sensory deficits in extremities.; Skin: Color normal, Warm, Dry.    ED Treatments / Results  Labs (all labs ordered are listed, but only abnormal results are displayed)   EKG  EKG Interpretation None       Radiology   Procedures Procedures (including critical care time)  Medications Ordered in ED Medications  ondansetron (ZOFRAN) injection 4 mg (4 mg Intravenous Given 04/09/16 2037)  0.9 %  sodium chloride infusion ( Intravenous New Bag/Given 04/09/16 2130)  fentaNYL (SUBLIMAZE) injection 6.5 mcg (6.5 mcg Intravenous Given 04/09/16 2157)     Initial Impression / Assessment and Plan / ED Course  I have reviewed the triage vital signs and the nursing notes.  Pertinent labs & imaging results that were available during my care of the patient were reviewed by me and considered in my medical decision making (see chart for details).  MDM Reviewed: previous chart, nursing note and vitals Reviewed previous: labs and ECG Interpretation: labs, ECG and x-ray   Results for orders placed or performed during the hospital encounter of 04/09/16  CBC with Differential  Result Value Ref Range   WBC 9.5 4.0 - 10.5 K/uL    RBC 4.32 3.87 - 5.11 MIL/uL   Hemoglobin 13.2 12.0 - 15.0 g/dL   HCT 39.0 36.0 - 46.0 %   MCV 90.3 78.0 - 100.0 fL   MCH 30.6 26.0 - 34.0 pg   MCHC 33.8 30.0 - 36.0 g/dL   RDW 12.3 11.5 - 15.5 %   Platelets 258 150 - 400 K/uL   Neutrophils Relative % 67 %   Neutro Abs 6.4 1.7 - 7.7 K/uL   Lymphocytes Relative 24 %   Lymphs Abs 2.3 0.7 - 4.0 K/uL   Monocytes Relative 7 %   Monocytes Absolute 0.6 0.1 - 1.0 K/uL   Eosinophils Relative 1 %   Eosinophils Absolute 0.1 0.0 - 0.7 K/uL   Basophils Relative 1 %   Basophils Absolute 0.1 0.0 - 0.1 K/uL  Protime-INR  Result Value Ref Range   Prothrombin Time 13.2  11.4 - 15.2 seconds   INR 1.00    Dg Chest 1 View Result Date: 04/09/2016 CLINICAL DATA:  Status post trip and fall tonight with a right hip fracture. Preoperative examination. EXAM: CHEST 1 VIEW COMPARISON:  PA and lateral chest 06/22/2014. FINDINGS: The lungs are clear. Heart size is normal. The aorta is tortuous. No pneumothorax or pleural effusion. No focal bony abnormality. Scoliosis noted. IMPRESSION: No acute disease. Electronically Signed   By: Inge Rise M.D.   On: 04/09/2016 21:10   Dg Hip Unilat  With Pelvis 2-3 Views Right Result Date: 04/09/2016 CLINICAL DATA:  Status post trip and fall tonight while taking garbage out. Right hip pain. Initial encounter. EXAM: DG HIP (WITH OR WITHOUT PELVIS) 2-3V RIGHT COMPARISON:  None. FINDINGS: The patient has an acute right intertrochanteric fracture. No other acute bony or joint abnormality is seen. Bones somewhat osteopenic. IMPRESSION: Acute right intertrochanteric fracture. Electronically Signed   By: Inge Rise M.D.   On: 04/09/2016 21:10    2210:  Dx and testing d/w pt and family.  Questions answered.  Verb understanding, agreeable to admit.  T/C to [facility] Dr. Ninfa Linden, case discussed, including:  HPI, pertinent PM/SHx, VS/PE, dx testing, ED course and treatment:  Agreeable to consult, requests to have [facility] admit,  surgery likely late tomorrow afternoon/evening. T/C to [facility] Dr. Darrick Meigs, case discussed, including:  HPI, pertinent PM/SHx, VS/PE, dx testing, ED course and treatment:  Agreeable to facilitate transfer to [facility] for admit, requests to write temporary orders, obtain medical bed to team [facility].       Final Clinical Impressions(s) / ED Diagnoses   Final diagnoses:  None    New Prescriptions New Prescriptions   No medications on file     Francine Graven, DO 04/11/16 1506

## 2016-04-09 NOTE — ED Triage Notes (Signed)
Per EMS patient was found lying in yard for approx. 30 minutes by family. Patient states she was reaching for something and lost her balance. Denies loc, neck or back pain. Reports of right hip pain.

## 2016-04-10 ENCOUNTER — Observation Stay (HOSPITAL_COMMUNITY): Payer: Medicare Other | Admitting: Anesthesiology

## 2016-04-10 ENCOUNTER — Encounter (HOSPITAL_COMMUNITY)
Admission: EM | Disposition: A | Discharge status: Skilled Nursing Facility | Payer: Self-pay | Source: Home / Self Care | Attending: Internal Medicine

## 2016-04-10 ENCOUNTER — Inpatient Hospital Stay (HOSPITAL_COMMUNITY): Payer: Medicare Other

## 2016-04-10 ENCOUNTER — Encounter (HOSPITAL_COMMUNITY): Payer: Self-pay | Admitting: Anesthesiology

## 2016-04-10 DIAGNOSIS — Z880 Allergy status to penicillin: Secondary | ICD-10-CM | POA: Diagnosis not present

## 2016-04-10 DIAGNOSIS — N390 Urinary tract infection, site not specified: Secondary | ICD-10-CM | POA: Diagnosis not present

## 2016-04-10 DIAGNOSIS — M25551 Pain in right hip: Secondary | ICD-10-CM | POA: Diagnosis not present

## 2016-04-10 DIAGNOSIS — H409 Unspecified glaucoma: Secondary | ICD-10-CM | POA: Diagnosis not present

## 2016-04-10 DIAGNOSIS — W19XXXS Unspecified fall, sequela: Secondary | ICD-10-CM | POA: Diagnosis not present

## 2016-04-10 DIAGNOSIS — D72829 Elevated white blood cell count, unspecified: Secondary | ICD-10-CM

## 2016-04-10 DIAGNOSIS — S72141A Displaced intertrochanteric fracture of right femur, initial encounter for closed fracture: Secondary | ICD-10-CM | POA: Diagnosis present

## 2016-04-10 DIAGNOSIS — W010XXA Fall on same level from slipping, tripping and stumbling without subsequent striking against object, initial encounter: Secondary | ICD-10-CM | POA: Diagnosis present

## 2016-04-10 DIAGNOSIS — N904 Leukoplakia of vulva: Secondary | ICD-10-CM | POA: Diagnosis not present

## 2016-04-10 DIAGNOSIS — S72001S Fracture of unspecified part of neck of right femur, sequela: Secondary | ICD-10-CM

## 2016-04-10 DIAGNOSIS — M6281 Muscle weakness (generalized): Secondary | ICD-10-CM | POA: Diagnosis not present

## 2016-04-10 DIAGNOSIS — S72009A Fracture of unspecified part of neck of unspecified femur, initial encounter for closed fracture: Secondary | ICD-10-CM | POA: Diagnosis not present

## 2016-04-10 DIAGNOSIS — Z9181 History of falling: Secondary | ICD-10-CM | POA: Diagnosis not present

## 2016-04-10 DIAGNOSIS — R262 Difficulty in walking, not elsewhere classified: Secondary | ICD-10-CM | POA: Diagnosis not present

## 2016-04-10 DIAGNOSIS — Z833 Family history of diabetes mellitus: Secondary | ICD-10-CM | POA: Diagnosis not present

## 2016-04-10 DIAGNOSIS — Z8542 Personal history of malignant neoplasm of other parts of uterus: Secondary | ICD-10-CM | POA: Diagnosis not present

## 2016-04-10 DIAGNOSIS — E876 Hypokalemia: Secondary | ICD-10-CM | POA: Diagnosis present

## 2016-04-10 DIAGNOSIS — Z8249 Family history of ischemic heart disease and other diseases of the circulatory system: Secondary | ICD-10-CM | POA: Diagnosis not present

## 2016-04-10 DIAGNOSIS — Z885 Allergy status to narcotic agent status: Secondary | ICD-10-CM | POA: Diagnosis not present

## 2016-04-10 DIAGNOSIS — Z4789 Encounter for other orthopedic aftercare: Secondary | ICD-10-CM | POA: Diagnosis not present

## 2016-04-10 DIAGNOSIS — Z823 Family history of stroke: Secondary | ICD-10-CM | POA: Diagnosis not present

## 2016-04-10 DIAGNOSIS — S72141D Displaced intertrochanteric fracture of right femur, subsequent encounter for closed fracture with routine healing: Secondary | ICD-10-CM | POA: Diagnosis not present

## 2016-04-10 HISTORY — PX: INTRAMEDULLARY (IM) NAIL INTERTROCHANTERIC: SHX5875

## 2016-04-10 LAB — CBC
HCT: 38.7 % (ref 36.0–46.0)
HCT: 36.3 % (ref 36.0–46.0)
Hemoglobin: 12.8 g/dL (ref 12.0–15.0)
Hemoglobin: 12 g/dL (ref 12.0–15.0)
MCH: 30 pg (ref 26.0–34.0)
MCH: 30.2 pg (ref 26.0–34.0)
MCHC: 33.1 g/dL (ref 30.0–36.0)
MCHC: 33.1 g/dL (ref 30.0–36.0)
MCV: 90.6 fL (ref 78.0–100.0)
MCV: 91.4 fL (ref 78.0–100.0)
Platelets: 223 10*3/uL (ref 150–400)
Platelets: 223 10*3/uL (ref 150–400)
RBC: 4.27 MIL/uL (ref 3.87–5.11)
RBC: 3.97 MIL/uL (ref 3.87–5.11)
RDW: 12.3 % (ref 11.5–15.5)
RDW: 12.5 % (ref 11.5–15.5)
WBC: 13.4 10*3/uL — ABNORMAL HIGH (ref 4.0–10.5)
WBC: 10.5 10*3/uL (ref 4.0–10.5)

## 2016-04-10 LAB — BASIC METABOLIC PANEL
Anion gap: 6 (ref 5–15)
BUN: 8 mg/dL (ref 6–20)
CO2: 25 mmol/L (ref 22–32)
Calcium: 8.8 mg/dL — ABNORMAL LOW (ref 8.9–10.3)
Chloride: 108 mmol/L (ref 101–111)
Creatinine, Ser: 0.78 mg/dL (ref 0.44–1.00)
GFR calc Af Amer: >60 mL/min (ref >60)
GFR calc non Af Amer: >60 mL/min (ref >60)
Glucose, Bld: 111 mg/dL — ABNORMAL HIGH (ref 65–99)
Potassium: 4 mmol/L (ref 3.5–5.1)
Sodium: 139 mmol/L (ref 135–145)

## 2016-04-10 LAB — CREATININE, SERUM
Creatinine, Ser: 0.8 mg/dL (ref 0.44–1.00)
GFR calc Af Amer: >60 mL/min (ref >60)
GFR calc non Af Amer: >60 mL/min (ref >60)

## 2016-04-10 LAB — SURGICAL PCR SCREEN
MRSA, PCR: POSITIVE — AB
Staphylococcus aureus: POSITIVE — AB

## 2016-04-10 SURGERY — FIXATION, FRACTURE, INTERTROCHANTERIC, WITH INTRAMEDULLARY ROD
Anesthesia: General | Site: Hip | Laterality: Right

## 2016-04-10 MED ORDER — SODIUM CHLORIDE 0.9 % IV SOLN
INTRAVENOUS | Status: AC
  Administered 2016-04-10: 01:00:00 via INTRAVENOUS

## 2016-04-10 MED ORDER — ASPIRIN EC 325 MG PO TBEC
325.0000 mg | DELAYED_RELEASE_TABLET | Freq: Every day | ORAL | Status: DC
  Administered 2016-04-11 – 2016-04-13 (×3): 325 mg via ORAL
  Filled 2016-04-10 (×3): qty 1

## 2016-04-10 MED ORDER — PROPOFOL 10 MG/ML IV BOLUS
INTRAVENOUS | Status: AC
  Filled 2016-04-10: qty 20

## 2016-04-10 MED ORDER — HYDROCODONE-ACETAMINOPHEN 5-325 MG PO TABS
1.0000 | ORAL_TABLET | Freq: Four times a day (QID) | ORAL | Status: DC | PRN
  Administered 2016-04-10 (×2): 1 via ORAL
  Filled 2016-04-10 (×2): qty 1

## 2016-04-10 MED ORDER — METHOCARBAMOL 500 MG PO TABS
500.0000 mg | ORAL_TABLET | Freq: Four times a day (QID) | ORAL | Status: DC | PRN
  Administered 2016-04-10 – 2016-04-13 (×6): 500 mg via ORAL
  Filled 2016-04-10 (×6): qty 1

## 2016-04-10 MED ORDER — CLINDAMYCIN PHOSPHATE 600 MG/50ML IV SOLN
600.0000 mg | Freq: Two times a day (BID) | INTRAVENOUS | Status: AC
  Administered 2016-04-10 – 2016-04-11 (×2): 600 mg via INTRAVENOUS
  Filled 2016-04-10 (×2): qty 50

## 2016-04-10 MED ORDER — CEFAZOLIN SODIUM-DEXTROSE 2-4 GM/100ML-% IV SOLN: 2.0000 g | Freq: Four times a day (QID) | INTRAVENOUS | Status: DC

## 2016-04-10 MED ORDER — MUPIROCIN 2 % EX OINT
1.0000 "application " | TOPICAL_OINTMENT | Freq: Two times a day (BID) | CUTANEOUS | Status: DC
  Administered 2016-04-10 – 2016-04-13 (×7): 1 via NASAL
  Filled 2016-04-10: qty 22

## 2016-04-10 MED ORDER — ENOXAPARIN SODIUM 40 MG/0.4ML ~~LOC~~ SOLN
40.0000 mg | SUBCUTANEOUS | Status: DC
  Administered 2016-04-10 – 2016-04-13 (×4): 40 mg via SUBCUTANEOUS
  Filled 2016-04-10 (×4): qty 0.4

## 2016-04-10 MED ORDER — METHOCARBAMOL 500 MG PO TABS
ORAL_TABLET | ORAL | Status: AC
  Filled 2016-04-10: qty 1

## 2016-04-10 MED ORDER — BOOST / RESOURCE BREEZE PO LIQD
1.0000 | Freq: Two times a day (BID) | ORAL | Status: DC
  Administered 2016-04-11 – 2016-04-13 (×5): 1 via ORAL

## 2016-04-10 MED ORDER — ACETAMINOPHEN 325 MG PO TABS: 650.0000 mg | ORAL_TABLET | Freq: Four times a day (QID) | ORAL | Status: DC | PRN

## 2016-04-10 MED ORDER — SENNOSIDES-DOCUSATE SODIUM 8.6-50 MG PO TABS
1.0000 | ORAL_TABLET | Freq: Every evening | ORAL | Status: DC | PRN
  Administered 2016-04-13: 1 via ORAL
  Filled 2016-04-10: qty 1

## 2016-04-10 MED ORDER — ONDANSETRON HCL 4 MG/2ML IJ SOLN
INTRAMUSCULAR | Status: DC | PRN
  Administered 2016-04-10: 4 mg via INTRAVENOUS

## 2016-04-10 MED ORDER — ONDANSETRON HCL 4 MG/2ML IJ SOLN
4.0000 mg | Freq: Four times a day (QID) | INTRAMUSCULAR | Status: DC | PRN
  Administered 2016-04-13: 4 mg via INTRAVENOUS
  Filled 2016-04-10: qty 2

## 2016-04-10 MED ORDER — SODIUM CHLORIDE 0.9 % IV SOLN
INTRAVENOUS | Status: DC
  Administered 2016-04-10 – 2016-04-11 (×3): via INTRAVENOUS

## 2016-04-10 MED ORDER — HYDROMORPHONE HCL 1 MG/ML IJ SOLN
0.2500 mg | INTRAMUSCULAR | Status: DC | PRN
  Administered 2016-04-10: 0.5 mg via INTRAVENOUS

## 2016-04-10 MED ORDER — HYDROCODONE-ACETAMINOPHEN 5-325 MG PO TABS
1.0000 | ORAL_TABLET | Freq: Four times a day (QID) | ORAL | Status: DC | PRN
  Administered 2016-04-10 – 2016-04-11 (×3): 1 via ORAL
  Administered 2016-04-11: 2 via ORAL
  Administered 2016-04-12 – 2016-04-13 (×4): 1 via ORAL
  Filled 2016-04-10 (×5): qty 1
  Filled 2016-04-10: qty 2
  Filled 2016-04-10 (×2): qty 1

## 2016-04-10 MED ORDER — LATANOPROST 0.005 % OP SOLN
1.0000 [drp] | Freq: Every day | OPHTHALMIC | Status: DC
  Administered 2016-04-10 – 2016-04-12 (×3): 1 [drp] via OPHTHALMIC
  Filled 2016-04-10: qty 2.5

## 2016-04-10 MED ORDER — METOCLOPRAMIDE HCL 5 MG PO TABS: 5.0000 mg | ORAL_TABLET | Freq: Three times a day (TID) | ORAL | Status: DC | PRN

## 2016-04-10 MED ORDER — 0.9 % SODIUM CHLORIDE (POUR BTL) OPTIME
TOPICAL | Status: DC | PRN
  Administered 2016-04-10: 1000 mL

## 2016-04-10 MED ORDER — PHENYLEPHRINE HCL 10 MG/ML IJ SOLN
INTRAMUSCULAR | Status: DC | PRN
  Administered 2016-04-10 (×6): 80 ug via INTRAVENOUS

## 2016-04-10 MED ORDER — CHLORHEXIDINE GLUCONATE CLOTH 2 % EX PADS
6.0000 | MEDICATED_PAD | Freq: Every day | CUTANEOUS | Status: DC
  Administered 2016-04-10 – 2016-04-13 (×4): 6 via TOPICAL

## 2016-04-10 MED ORDER — PHENOL 1.4 % MT LIQD: 1.0000 | OROMUCOSAL | Status: DC | PRN

## 2016-04-10 MED ORDER — MORPHINE SULFATE (PF) 2 MG/ML IV SOLN
0.5000 mg | INTRAVENOUS | Status: DC | PRN
Start: 2016-04-10 — End: 2016-04-13

## 2016-04-10 MED ORDER — LACTATED RINGERS IV SOLN
INTRAVENOUS | Status: DC | PRN
  Administered 2016-04-10: 16:00:00 via INTRAVENOUS

## 2016-04-10 MED ORDER — SUCCINYLCHOLINE CHLORIDE 20 MG/ML IJ SOLN
INTRAMUSCULAR | Status: DC | PRN
  Administered 2016-04-10: 100 mg via INTRAVENOUS

## 2016-04-10 MED ORDER — FENTANYL CITRATE (PF) 250 MCG/5ML IJ SOLN
INTRAMUSCULAR | Status: AC
  Filled 2016-04-10: qty 5

## 2016-04-10 MED ORDER — CIPROFLOXACIN IN D5W 400 MG/200ML IV SOLN
400.0000 mg | Freq: Two times a day (BID) | INTRAVENOUS | Status: DC
  Administered 2016-04-11 – 2016-04-12 (×4): 400 mg via INTRAVENOUS
  Filled 2016-04-10 (×6): qty 200

## 2016-04-10 MED ORDER — HYDROCODONE-ACETAMINOPHEN 5-325 MG PO TABS
ORAL_TABLET | ORAL | Status: AC
  Filled 2016-04-10: qty 2

## 2016-04-10 MED ORDER — CIPROFLOXACIN IN D5W 400 MG/200ML IV SOLN
400.0000 mg | Freq: Once | INTRAVENOUS | Status: AC
  Administered 2016-04-10: 400 mg via INTRAVENOUS
  Filled 2016-04-10: qty 200

## 2016-04-10 MED ORDER — MENTHOL 3 MG MT LOZG: 1.0000 | LOZENGE | OROMUCOSAL | Status: DC | PRN

## 2016-04-10 MED ORDER — CIPROFLOXACIN IN D5W 400 MG/200ML IV SOLN: 400.0000 mg | INTRAVENOUS | Status: DC

## 2016-04-10 MED ORDER — DOCUSATE SODIUM 100 MG PO CAPS
100.0000 mg | ORAL_CAPSULE | Freq: Two times a day (BID) | ORAL | Status: DC
  Administered 2016-04-10 – 2016-04-13 (×6): 100 mg via ORAL
  Filled 2016-04-10 (×6): qty 1

## 2016-04-10 MED ORDER — ONDANSETRON HCL 4 MG PO TABS: 4.0000 mg | ORAL_TABLET | Freq: Four times a day (QID) | ORAL | Status: DC | PRN

## 2016-04-10 MED ORDER — ZOLPIDEM TARTRATE 5 MG PO TABS: 5.0000 mg | ORAL_TABLET | Freq: Every evening | ORAL | Status: DC | PRN

## 2016-04-10 MED ORDER — CLINDAMYCIN PHOSPHATE 900 MG/50ML IV SOLN
900.0000 mg | INTRAVENOUS | Status: AC
  Administered 2016-04-10: 900 mg via INTRAVENOUS
  Filled 2016-04-10 (×2): qty 50

## 2016-04-10 MED ORDER — PROMETHAZINE HCL 25 MG/ML IJ SOLN: 6.2500 mg | INTRAMUSCULAR | Status: DC | PRN

## 2016-04-10 MED ORDER — MORPHINE SULFATE (PF) 2 MG/ML IV SOLN
0.5000 mg | INTRAVENOUS | Status: DC | PRN
  Filled 2016-04-10: qty 1

## 2016-04-10 MED ORDER — LIDOCAINE HCL (CARDIAC) 20 MG/ML IV SOLN
INTRAVENOUS | Status: DC | PRN
  Administered 2016-04-10: 60 mg via INTRAVENOUS

## 2016-04-10 MED ORDER — CIPROFLOXACIN IN D5W 400 MG/200ML IV SOLN
400.0000 mg | Freq: Two times a day (BID) | INTRAVENOUS | Status: DC
  Administered 2016-04-10: 400 mg via INTRAVENOUS
  Filled 2016-04-10: qty 200

## 2016-04-10 MED ORDER — TRAMADOL HCL 50 MG PO TABS: 100.0000 mg | ORAL_TABLET | Freq: Three times a day (TID) | ORAL | Status: DC | PRN

## 2016-04-10 MED ORDER — ACETAMINOPHEN 650 MG RE SUPP: 650.0000 mg | Freq: Four times a day (QID) | RECTAL | Status: DC | PRN

## 2016-04-10 MED ORDER — FENTANYL CITRATE (PF) 100 MCG/2ML IJ SOLN
INTRAMUSCULAR | Status: DC | PRN
  Administered 2016-04-10 (×3): 50 ug via INTRAVENOUS

## 2016-04-10 MED ORDER — PROPOFOL 10 MG/ML IV BOLUS
INTRAVENOUS | Status: DC | PRN
  Administered 2016-04-10: 80 mg via INTRAVENOUS

## 2016-04-10 MED ORDER — METOCLOPRAMIDE HCL 5 MG/ML IJ SOLN: 5.0000 mg | Freq: Three times a day (TID) | INTRAMUSCULAR | Status: DC | PRN

## 2016-04-10 MED ORDER — HYDROMORPHONE HCL 1 MG/ML IJ SOLN
INTRAMUSCULAR | Status: AC
  Filled 2016-04-10: qty 1

## 2016-04-10 MED ORDER — METHOCARBAMOL 1000 MG/10ML IJ SOLN
500.0000 mg | Freq: Four times a day (QID) | INTRAVENOUS | Status: DC | PRN
  Filled 2016-04-10: qty 5

## 2016-04-10 SURGICAL SUPPLY — 48 items
BLADE SURG 15 STRL LF DISP TIS (BLADE) ×1 IMPLANT
BLADE SURG 15 STRL SS (BLADE) ×2
BNDG COHESIVE 4X5 TAN NS LF (GAUZE/BANDAGES/DRESSINGS) ×2 IMPLANT
BNDG GAUZE ELAST 4 BULKY (GAUZE/BANDAGES/DRESSINGS) ×2 IMPLANT
COVER PERINEAL POST (MISCELLANEOUS) ×2 IMPLANT
COVER SURGICAL LIGHT HANDLE (MISCELLANEOUS) ×2 IMPLANT
DRAPE PROXIMA HALF (DRAPES) IMPLANT
DRAPE STERI IOBAN 125X83 (DRAPES) ×2 IMPLANT
DRSG MEPILEX BORDER 4X4 (GAUZE/BANDAGES/DRESSINGS) ×1 IMPLANT
DRSG MEPILEX BORDER 4X8 (GAUZE/BANDAGES/DRESSINGS) ×2 IMPLANT
DRSG PAD ABDOMINAL 8X10 ST (GAUZE/BANDAGES/DRESSINGS) ×4 IMPLANT
DURAPREP 26ML APPLICATOR (WOUND CARE) ×2 IMPLANT
ELECT REM PT RETURN 9FT ADLT (ELECTROSURGICAL) ×2
ELECTRODE REM PT RTRN 9FT ADLT (ELECTROSURGICAL) ×1 IMPLANT
FACESHIELD WRAPAROUND (MASK) ×2 IMPLANT
FACESHIELD WRAPAROUND OR TEAM (MASK) ×1 IMPLANT
GAUZE XEROFORM 1X8 LF (GAUZE/BANDAGES/DRESSINGS) ×1 IMPLANT
GAUZE XEROFORM 5X9 LF (GAUZE/BANDAGES/DRESSINGS) ×2 IMPLANT
GLOVE BIO SURGEON STRL SZ8 (GLOVE) ×2 IMPLANT
GLOVE BIOGEL PI IND STRL 8 (GLOVE) ×1 IMPLANT
GLOVE BIOGEL PI INDICATOR 8 (GLOVE) ×1
GLOVE ORTHO TXT STRL SZ7.5 (GLOVE) ×2 IMPLANT
GOWN STRL REUS W/ TWL LRG LVL3 (GOWN DISPOSABLE) ×1 IMPLANT
GOWN STRL REUS W/ TWL XL LVL3 (GOWN DISPOSABLE) ×2 IMPLANT
GOWN STRL REUS W/TWL LRG LVL3 (GOWN DISPOSABLE) ×2
GOWN STRL REUS W/TWL XL LVL3 (GOWN DISPOSABLE) ×4
GUIDE PIN 3.2X343 (PIN) ×2
GUIDE PIN 3.2X343MM (PIN) ×4
KIT BASIN OR (CUSTOM PROCEDURE TRAY) ×2 IMPLANT
KIT ROOM TURNOVER OR (KITS) ×2 IMPLANT
LINER BOOT UNIVERSAL DISP (MISCELLANEOUS) ×2 IMPLANT
MANIFOLD NEPTUNE II (INSTRUMENTS) ×2 IMPLANT
NAIL RIGHT 10X38MM (Nail) ×1 IMPLANT
NS IRRIG 1000ML POUR BTL (IV SOLUTION) ×2 IMPLANT
PACK GENERAL/GYN (CUSTOM PROCEDURE TRAY) ×2 IMPLANT
PAD ARMBOARD 7.5X6 YLW CONV (MISCELLANEOUS) ×4 IMPLANT
PAD CAST 4YDX4 CTTN HI CHSV (CAST SUPPLIES) ×2 IMPLANT
PADDING CAST COTTON 4X4 STRL (CAST SUPPLIES) ×4
PIN GUIDE 3.2X343MM (PIN) IMPLANT
SCREW LAG COMPR KIT 95/90 (Screw) ×1 IMPLANT
STAPLER VISISTAT 35W (STAPLE) ×2 IMPLANT
SUT VIC AB 0 CT1 27 (SUTURE) ×6
SUT VIC AB 0 CT1 27XBRD ANBCTR (SUTURE) ×2 IMPLANT
SUT VIC AB 2-0 CT1 27 (SUTURE) ×4
SUT VIC AB 2-0 CT1 TAPERPNT 27 (SUTURE) ×2 IMPLANT
TOWEL OR 17X24 6PK STRL BLUE (TOWEL DISPOSABLE) ×2 IMPLANT
TOWEL OR 17X26 10 PK STRL BLUE (TOWEL DISPOSABLE) ×2 IMPLANT
WATER STERILE IRR 1000ML POUR (IV SOLUTION) ×2 IMPLANT

## 2016-04-10 NOTE — Progress Notes (Signed)
Initial Nutrition Assessment  DOCUMENTATION CODES:   Not applicable  INTERVENTION:  Once diet advances, provide Boost Breeze po BID, each supplement provides 250 kcal and 9 grams of protein.  NUTRITION DIAGNOSIS:   Increased nutrient needs related to  (s/p surgery) as evidenced by estimated needs.  GOAL:   Patient will meet greater than or equal to 90% of their needs  MONITOR:   Supplement acceptance, PO intake, Weight trends, Labs, Skin, I & O's  REASON FOR ASSESSMENT:   Consult Assessment of nutrition requirement/status  ASSESSMENT:   81 y.o. female, With no significant medical problems, came to the hospital after a fall at home. In the ED x-ray of the hip showed right intertrochanteric fracture.  Pt is currently NPO for surgery today. Pt reports hunger during time of visit. Pt reports eating well PTA with usual consumption of at least 3 meals a day. Diet recall includes eggs and oatmeal for breakfast, a salad for lunch, and a protein and vegetables for dinner. Weight has been stable. Pt is agreeable to nutritional supplements to aid in caloric and protein needs. Pt does reports not liking milk products. RD to order [facility]. Nursing staff to provide once diet advances.  Nutrition-Focused physical exam completed. Findings are no fat depletion, mild to moderate muscle depletion, and no edema.   Labs and medications reviewed.   Diet Order:  Diet NPO time specified  Skin:  Reviewed, no issues  Last BM:  7/31  Height:   Ht Readings from Last 1 Encounters:  04/09/16 5\' 6"  (1.676 m)    Weight:   Wt Readings from Last 1 Encounters:  04/09/16 155 lb (70.3 kg)    Ideal Body Weight:  59 kg  BMI:  Body mass index is 25.02 kg/m.  Estimated Nutritional Needs:   Kcal:  1700-1850  Protein:  70-80 grams  Fluid:  1.7 - 1.8 L/day  EDUCATION NEEDS:   No education needs identified at this time  Corrin Parker, MS, RD, LDN Pager # [phone] After hours/  weekend pager # [phone]

## 2016-04-10 NOTE — Brief Op Note (Signed)
04/09/2016 - 04/10/2016  4:58 PM  PATIENT:  Brittany Mahoney  81 y.o. female  PRE-OPERATIVE DIAGNOSIS:  Right hip inter troch fracture  POST-OPERATIVE DIAGNOSIS:  Right hip inter troch fracture  PROCEDURE:  Procedure(s): INTRAMEDULLARY (IM) NAIL RIGHT HIP FRACTURE (Right)  SURGEON:  Surgeon(s) and Role:    * Mcarthur Rossetti, MD - Primary  PHYSICIAN ASSISTANT: Benita Stabile, PA-C  ANESTHESIA:   general  EBL:  Total I/O In: -  Out: 825 [Urine:625; Blood:200]  COUNTS:  YES  DICTATION: .Other Dictation: Dictation Number [id number]  PLAN OF CARE: Admit to inpatient   PATIENT DISPOSITION:  PACU - hemodynamically stable.   Delay start of Pharmacological VTE agent (>24hrs) due to surgical blood loss or risk of bleeding: no

## 2016-04-10 NOTE — Consult Note (Signed)
Reason for Consult:  Right hip fracture Referring Physician: [facility]  Brittany Mahoney is an 81 y.o. female.  HPI:   81 yo female who sustained a right hip fracture following an accidental mechanical fall.  After severe right hip pain and the inability to ambulated, was taken to [facility].  Was found to have a right hip fracture.  She and her family requested transfer to [facility] for definitive treatment.  She does report significant right hip pain and denies other injuries.  Past Medical History:  Diagnosis Date  . Cancer ([place])    uterus  . Headache(784.0)    migraines after periods  . Hepatitis   . PMB (postmenopausal bleeding)     Past Surgical History:  Procedure Laterality Date  . ABDOMINAL HYSTERECTOMY     TAH and BSO  . DILATION AND CURETTAGE OF UTERUS      Family History  Problem Relation Age of Onset  . Stroke Maternal Grandfather   . Stroke Paternal Grandfather   . Cancer Father     lymphatic sarcoma  . Early death Sister   . Early death Son   . Diabetes Son   . Heart disease Other   . Cancer Other   . Tuberculosis Other   . Diabetes Other   . Thyroid disease Other   . Stroke Other   . Heart disease Maternal Aunt   . Heart disease Maternal Uncle     Social History:  reports that she has never smoked. She has never used smokeless tobacco. She reports that she does not drink alcohol or use drugs.  Allergies:  Allergies  Allergen Reactions  . Codeine   . Morphine And Related     loopy  . Penicillins Hives and Swelling    Has patient had a PCN reaction causing immediate rash, facial/tongue/throat swelling, SOB or lightheadedness with hypotension: Yes Has patient had a PCN reaction causing severe rash involving mucus membranes or skin necrosis: No Has patient had a PCN reaction that required hospitalization No Has patient had a PCN reaction occurring within the last 10 years: No  If all of the above answers are "NO", then may proceed with  Cephalosporin use.     Medications: I have reviewed the patient's current medications.  Results for orders placed or performed during the hospital encounter of 04/09/16 (from the past 48 hour(s))  Basic metabolic panel     Status: Abnormal   Collection Time: 04/09/16  9:29 PM  Result Value Ref Range   Sodium 137 135 - 145 mmol/L   Potassium 3.2 (L) 3.5 - 5.1 mmol/L   Chloride 107 101 - 111 mmol/L   CO2 24 22 - 32 mmol/L   Glucose, Bld 102 (H) 65 - 99 mg/dL   BUN 10 6 - 20 mg/dL   Creatinine, Ser 0.75 0.44 - 1.00 mg/dL   Calcium 9.3 8.9 - 10.3 mg/dL   GFR calc non Af Amer >60 >60 mL/min   GFR calc Af Amer >60 >60 mL/min    Comment: (NOTE) The eGFR has been calculated using the CKD EPI equation. This calculation has not been validated in all clinical situations. eGFR's persistently <60 mL/min signify possible Chronic Kidney Disease.    Anion gap 6 5 - 15  Troponin I     Status: None   Collection Time: 04/09/16  9:29 PM  Result Value Ref Range   Troponin I <0.03 <0.03 ng/mL  CBC with Differential  Status: None   Collection Time: 04/09/16  9:29 PM  Result Value Ref Range   WBC 9.5 4.0 - 10.5 K/uL   RBC 4.32 3.87 - 5.11 MIL/uL   Hemoglobin 13.2 12.0 - 15.0 g/dL   HCT 63.6 67.0 - 44.2 %   MCV 90.3 78.0 - 100.0 fL   MCH 30.6 26.0 - 34.0 pg   MCHC 33.8 30.0 - 36.0 g/dL   RDW 48.3 12.6 - 68.5 %   Platelets 258 150 - 400 K/uL   Neutrophils Relative % 67 %   Neutro Abs 6.4 1.7 - 7.7 K/uL   Lymphocytes Relative 24 %   Lymphs Abs 2.3 0.7 - 4.0 K/uL   Monocytes Relative 7 %   Monocytes Absolute 0.6 0.1 - 1.0 K/uL   Eosinophils Relative 1 %   Eosinophils Absolute 0.1 0.0 - 0.7 K/uL   Basophils Relative 1 %   Basophils Absolute 0.1 0.0 - 0.1 K/uL  Protime-INR     Status: None   Collection Time: 04/09/16  9:29 PM  Result Value Ref Range   Prothrombin Time 13.2 11.4 - 15.2 seconds   INR 1.00   Urinalysis, Routine w reflex microscopic     Status: Abnormal   Collection Time:  04/09/16 10:10 PM  Result Value Ref Range   Color, Urine YELLOW YELLOW   APPearance CLEAR CLEAR   Specific Gravity, Urine 1.010 1.005 - 1.030   pH 6.5 5.0 - 8.0   Glucose, UA NEGATIVE NEGATIVE mg/dL   Hgb urine dipstick SMALL (A) NEGATIVE   Bilirubin Urine NEGATIVE NEGATIVE   Ketones, ur NEGATIVE NEGATIVE mg/dL   Protein, ur NEGATIVE NEGATIVE mg/dL   Nitrite POSITIVE (A) NEGATIVE   Leukocytes, UA NEGATIVE NEGATIVE  Urine microscopic-add on     Status: Abnormal   Collection Time: 04/09/16 10:10 PM  Result Value Ref Range   Squamous Epithelial / LPF 0-5 (A) NONE SEEN   WBC, UA 0-5 0 - 5 WBC/hpf   RBC / HPF 6-30 0 - 5 RBC/hpf   Bacteria, UA MANY (A) NONE SEEN  CBC     Status: Abnormal   Collection Time: 04/10/16  1:04 AM  Result Value Ref Range   WBC 13.4 (H) 4.0 - 10.5 K/uL   RBC 4.27 3.87 - 5.11 MIL/uL   Hemoglobin 12.8 12.0 - 15.0 g/dL   HCT 82.6 85.8 - 87.9 %   MCV 90.6 78.0 - 100.0 fL   MCH 30.0 26.0 - 34.0 pg   MCHC 33.1 30.0 - 36.0 g/dL   RDW 99.5 44.8 - 13.5 %   Platelets 223 150 - 400 K/uL  Creatinine, serum     Status: None   Collection Time: 04/10/16  1:04 AM  Result Value Ref Range   Creatinine, Ser 0.80 0.44 - 1.00 mg/dL   GFR calc non Af Amer >60 >60 mL/min   GFR calc Af Amer >60 >60 mL/min    Comment: (NOTE) The eGFR has been calculated using the CKD EPI equation. This calculation has not been validated in all clinical situations. eGFR's persistently <60 mL/min signify possible Chronic Kidney Disease.   Surgical pcr screen     Status: Abnormal   Collection Time: 04/10/16  1:41 AM  Result Value Ref Range   MRSA, PCR POSITIVE (A) NEGATIVE    Comment: ATTEMPTS TO [place] RESULTS X4 AT [id number] [phone] AND 0517 A BROWNING RESULT CALLED TO, READ BACK BY AND VERIFIED WITH: Damita Lack AT 3874 ON [id number] BY Lucienne Capers  Staphylococcus aureus POSITIVE (A) NEGATIVE    Comment:        The Xpert SA Assay (FDA approved for NASAL specimens in patients over  72 years of age), is one component of a comprehensive surveillance program.  Test performance has been validated by [facility] for patients greater than or equal to 80 year old. It is not intended to diagnose infection nor to guide or monitor treatment.   CBC     Status: None   Collection Time: 04/10/16  5:36 AM  Result Value Ref Range   WBC 10.5 4.0 - 10.5 K/uL   RBC 3.97 3.87 - 5.11 MIL/uL   Hemoglobin 12.0 12.0 - 15.0 g/dL   HCT 36.3 36.0 - 46.0 %   MCV 91.4 78.0 - 100.0 fL   MCH 30.2 26.0 - 34.0 pg   MCHC 33.1 30.0 - 36.0 g/dL   RDW 12.5 11.5 - 15.5 %   Platelets 223 150 - 400 K/uL  Basic metabolic panel     Status: Abnormal   Collection Time: 04/10/16  5:36 AM  Result Value Ref Range   Sodium 139 135 - 145 mmol/L   Potassium 4.0 3.5 - 5.1 mmol/L   Chloride 108 101 - 111 mmol/L   CO2 25 22 - 32 mmol/L   Glucose, Bld 111 (H) 65 - 99 mg/dL   BUN 8 6 - 20 mg/dL   Creatinine, Ser 0.78 0.44 - 1.00 mg/dL   Calcium 8.8 (L) 8.9 - 10.3 mg/dL   GFR calc non Af Amer >60 >60 mL/min   GFR calc Af Amer >60 >60 mL/min    Comment: (NOTE) The eGFR has been calculated using the CKD EPI equation. This calculation has not been validated in all clinical situations. eGFR's persistently <60 mL/min signify possible Chronic Kidney Disease.    Anion gap 6 5 - 15    Dg Chest 1 View  Result Date: 04/09/2016 CLINICAL DATA:  Status post trip and fall tonight with a right hip fracture. Preoperative examination. EXAM: CHEST 1 VIEW COMPARISON:  PA and lateral chest 06/22/2014. FINDINGS: The lungs are clear. Heart size is normal. The aorta is tortuous. No pneumothorax or pleural effusion. No focal bony abnormality. Scoliosis noted. IMPRESSION: No acute disease. Electronically Signed   By: Inge Rise M.D.   On: 04/09/2016 21:10   Dg Hip Unilat  With Pelvis 2-3 Views Right  Result Date: 04/09/2016 CLINICAL DATA:  Status post trip and fall tonight while taking garbage out. Right hip pain.  Initial encounter. EXAM: DG HIP (WITH OR WITHOUT PELVIS) 2-3V RIGHT COMPARISON:  None. FINDINGS: The patient has an acute right intertrochanteric fracture. No other acute bony or joint abnormality is seen. Bones somewhat osteopenic. IMPRESSION: Acute right intertrochanteric fracture. Electronically Signed   By: Inge Rise M.D.   On: 04/09/2016 21:10    Review of Systems  All other systems reviewed and are negative.  Blood pressure (!) 105/58, pulse 92, temperature (!) 100.6 F (38.1 C), temperature source Oral, resp. rate 16, height _0  (1.676 m), weight 70.3 kg (155 lb), SpO2 94 %. Physical Exam  Constitutional: She is oriented to person, place, and time. She appears well-developed and well-nourished.  HENT:  Head: Normocephalic and atraumatic.  Eyes: EOM are normal. Pupils are equal, round, and reactive to light.  Neck: Normal range of motion. Neck supple.  Cardiovascular: Normal rate and regular rhythm.   Respiratory: Effort normal and breath sounds normal.  GI: Soft. Bowel sounds are normal.  Musculoskeletal:       Right hip: She exhibits decreased range of motion, decreased strength, tenderness and deformity.  Neurological: She is alert and oriented to person, place, and time.  Skin: Skin is warm and dry.  Psychiatric: She has a normal mood and affect.    Assessment/Plan: Right hip with unstable intertrochanteric femur fracture 1)  I have spoken to the patient in detail about her fracture and the recommended surgical treatment as well as the risks and benefits involved.  Will proceed to surgery late this afternoon.  Kiki Bivens Y 04/10/2016, 7:41 AM

## 2016-04-10 NOTE — Transfer of Care (Signed)
Immediate Anesthesia Transfer of Care Note  Patient: Brittany Mahoney  Procedure(s) Performed: Procedure(s): INTRAMEDULLARY (IM) NAIL RIGHT HIP FRACTURE (Right)  Patient Location: PACU  Anesthesia Type:General  Level of Consciousness: awake  Airway & Oxygen Therapy: Patient Spontanous Breathing  Post-op Assessment: Report given to RN and Post -op Vital signs reviewed and stable  Post vital signs: Reviewed and stable  Last Vitals:  Vitals:   04/10/16 1236 04/10/16 1709  BP: (!) 128/53   Pulse: 83   Resp: 18   Temp: 36.9 C (P) 36.8 C    Last Pain:  Vitals:   04/10/16 0824  TempSrc:   PainSc: 2       Patients Stated Pain Goal: 2 ([id number])  Complications: No apparent anesthesia complications

## 2016-04-10 NOTE — Progress Notes (Signed)
Patient ID: Brittany Mahoney, female   DOB: 06/22/1935, 81 y.o.   MRN: [id number]  PROGRESS NOTE    Brittany Mahoney  [id number] DOB: Aug 26, 1935 DOA: 04/09/2016  PCP: Alonza Bogus, MD   Brief Narrative:  81 y.o. female with no significant past medical history who presented to [facility] status post fall at home. Patient reported severe right hip pain. She she was found to have right hip fracture and subsequently transferred to [facility] for definitive treatment.  Assessment & Plan:   Active Problems:   Closed right hip fracture (HCC) / Mechanical fall - Appreciate orthopedic surgery recommendations, plan for surgery today - Continue pain management efforts - Continue supportive care with IV fluids - Chest x-ray with no active disease - 12-lead EKG with sinus rhythm - Medically optimized for hip surgery    UTI / leukocytosis - Urinalysis on the admission with nitrites and many bacteria - Started on empiric Cipro - Follow up urine culture results   DVT prophylaxis: Lovenox subQ  Code Status: full code  Family Communication: Family not at the bedside Disposition Plan: Plan for surgery today   Consultants:   Orthopedic surgery, Dr. Jean Rosenthal  Procedures:   Lab for surgery today 04/10/2016  Antimicrobials:   Ciprofloxacin 04/09/2016 -->   Subjective: No overnight events.   Objective: Vitals:   04/09/16 2130 04/09/16 2200 04/09/16 2313 04/10/16 0500  BP: 144/83 161/86 148/68 (!) 105/58  Pulse: 88 91 93 92  Resp:   15 16  Temp:    (!) 100.6 F (38.1 C)  TempSrc:    Oral  SpO2: 98% 100% 96% 94%  Weight:      Height:        Intake/Output Summary (Last 24 hours) at 04/10/16 1113 Last data filed at 04/10/16 0300  Gross per 24 hour  Intake                0 ml  Output              650 ml  Net             -650 ml   Filed Weights   04/09/16 2018  Weight: 70.3 kg (155 lb)    Examination:  General exam: Appears calm and comfortable    Respiratory system: Clear to auscultation. Respiratory effort normal. Cardiovascular system: S1 & S2 heard, RRR. No JVD Gastrointestinal system: Abdomen is nondistended, soft and nontender.  Central nervous system: No focal neurological deficits. Extremities: Symmetric 5 x 5 power. Skin: No rashes, lesions or ulcers Psychiatry: Judgement and insight appear normal. Mood & affect appropriate.   Data Reviewed: I have personally reviewed following labs and imaging studies  CBC:  Recent Labs Lab 04/09/16 2129 04/10/16 0104 04/10/16 0536  WBC 9.5 13.4* 10.5  NEUTROABS 6.4  --   --   HGB 13.2 12.8 12.0  HCT 39.0 38.7 36.3  MCV 90.3 90.6 91.4  PLT 258 223 [id number]   Basic Metabolic Panel:  Recent Labs Lab 04/09/16 2129 04/10/16 0104 04/10/16 0536  NA 137  --  139  K 3.2*  --  4.0  CL 107  --  108  CO2 24  --  25  GLUCOSE 102*  --  111*  BUN 10  --  8  CREATININE 0.75 0.80 0.78  CALCIUM 9.3  --  8.8*   GFR: Estimated Creatinine Clearance: 53.4 mL/min (by C-G formula based on SCr of 0.8 mg/dL). Liver Function Tests: No  results for input(s): AST, ALT, ALKPHOS, BILITOT, PROT, ALBUMIN in the last 168 hours. No results for input(s): LIPASE, AMYLASE in the last 168 hours. No results for input(s): AMMONIA in the last 168 hours. Coagulation Profile:  Recent Labs Lab 04/09/16 2129  INR 1.00   Cardiac Enzymes:  Recent Labs Lab 04/09/16 2129  TROPONINI <0.03   BNP (last 3 results) No results for input(s): PROBNP in the last 8760 hours. HbA1C: No results for input(s): HGBA1C in the last 72 hours. CBG: No results for input(s): GLUCAP in the last 168 hours. Lipid Profile: No results for input(s): CHOL, HDL, LDLCALC, TRIG, CHOLHDL, LDLDIRECT in the last 72 hours. Thyroid Function Tests: No results for input(s): TSH, T4TOTAL, FREET4, T3FREE, THYROIDAB in the last 72 hours. Anemia Panel: No results for input(s): VITAMINB12, FOLATE, FERRITIN, TIBC, IRON, RETICCTPCT in the  last 72 hours. Urine analysis:    Component Value Date/Time   COLORURINE YELLOW 04/09/2016 2210   APPEARANCEUR CLEAR 04/09/2016 2210   LABSPEC 1.010 04/09/2016 2210   PHURINE 6.5 04/09/2016 2210   GLUCOSEU NEGATIVE 04/09/2016 2210   HGBUR SMALL (A) 04/09/2016 2210   BILIRUBINUR NEGATIVE 04/09/2016 2210   KETONESUR NEGATIVE 04/09/2016 2210   PROTEINUR NEGATIVE 04/09/2016 2210   NITRITE POSITIVE (A) 04/09/2016 2210   LEUKOCYTESUR NEGATIVE 04/09/2016 2210   Sepsis Labs: @LABRCNTIP (procalcitonin:4,lacticidven:4)   Surgical pcr screen     Status: Abnormal   Collection Time: 04/10/16  1:41 AM  Result Value Ref Range Status   MRSA, PCR POSITIVE (A) NEGATIVE Final   Staphylococcus aureus POSITIVE (A) NEGATIVE Final      Radiology Studies: Dg Chest 1 View Result Date: 04/09/2016 CLINICAL DATA: No acute disease. Electronically Signed   By: Inge Rise M.D.   On: 04/09/2016 21:10   Dg Hip Unilat  With Pelvis 2-3 Views Right Result Date: 04/09/2016 CLINICAL DATA:  Acute right intertrochanteric fracture.      Scheduled Meds: . sodium chloride   Intravenous STAT  . Chlorhexidine Gluconate Cloth  6 each Topical Q0600  . ciprofloxacin  400 mg Intravenous Q12H  . enoxaparin (LOVENOX) injection  40 mg Subcutaneous Q24H  . latanoprost  1 drop Both Eyes QHS  . mupirocin ointment  1 application Nasal BID   Continuous Infusions: . sodium chloride 75 mL/hr at 04/09/16 2130     LOS: 0 days    Time spent: 25 minutes  Greater than 50% of the time spent on counseling and coordinating the care.   Leisa Lenz, MD [facility] Pager [phone]  If 7PM-7AM, please contact night-coverage [email or web address] Password [email or web address] 04/10/2016, 11:13 AM

## 2016-04-10 NOTE — Anesthesia Postprocedure Evaluation (Signed)
Anesthesia Post Note  Patient: Brittany Mahoney  Procedure(s) Performed: Procedure(s) (LRB): INTRAMEDULLARY (IM) NAIL RIGHT HIP FRACTURE (Right)  Patient location during evaluation: PACU Anesthesia Type: General Level of consciousness: awake and alert Pain management: pain level controlled Vital Signs Assessment: post-procedure vital signs reviewed and stable Respiratory status: spontaneous breathing, nonlabored ventilation, respiratory function stable and patient connected to nasal cannula oxygen Cardiovascular status: blood pressure returned to baseline and stable Postop Assessment: no signs of nausea or vomiting Anesthetic complications: no    Last Vitals:  Vitals:   04/10/16 1236 04/10/16 1709  BP: (!) 128/53   Pulse: 83   Resp: 18   Temp: 36.9 C 36.8 C    Last Pain:  Vitals:   04/10/16 0824  TempSrc:   PainSc: 2                  Destiny Hagin S

## 2016-04-10 NOTE — Anesthesia Procedure Notes (Signed)
Procedure Name: Intubation Date/Time: 04/10/2016 4:06 PM Performed by: Manus Gunning, [name] J Pre-anesthesia Checklist: Patient identified, Emergency Drugs available, Suction available, Timeout performed and Patient being monitored Patient Re-evaluated:Patient Re-evaluated prior to inductionOxygen Delivery Method: Circle system utilized Preoxygenation: Pre-oxygenation with 100% oxygen Intubation Type: IV induction Ventilation: Mask ventilation without difficulty Laryngoscope Size: Mac and 3 Grade View: Grade I Tube type: Oral Tube size: 7.0 mm Number of attempts: 1 Placement Confirmation: ETT inserted through vocal cords under direct vision,  positive ETCO2 and breath sounds checked- equal and bilateral Secured at: 21 cm Tube secured with: Tape Dental Injury: Teeth and Oropharynx as per pre-operative assessment

## 2016-04-10 NOTE — Anesthesia Preprocedure Evaluation (Signed)
Anesthesia Evaluation  Patient identified by MRN, date of birth, ID band Patient awake    Reviewed: Allergy & Precautions, NPO status , Patient's Chart, lab work & pertinent test results  Airway Mallampati: II  TM Distance: >3 FB Neck ROM: Full    Dental no notable dental hx.    Pulmonary neg pulmonary ROS,    Pulmonary exam normal breath sounds clear to auscultation       Cardiovascular negative cardio ROS Normal cardiovascular exam Rhythm:Regular Rate:Normal     Neuro/Psych negative neurological ROS  negative psych ROS   GI/Hepatic negative GI ROS, Neg liver ROS,   Endo/Other  negative endocrine ROS  Renal/GU negative Renal ROS  negative genitourinary   Musculoskeletal negative musculoskeletal ROS (+)   Abdominal   Peds negative pediatric ROS (+)  Hematology negative hematology ROS (+)   Anesthesia Other Findings   Reproductive/Obstetrics negative OB ROS                            Anesthesia Physical Anesthesia Plan  ASA: II  Anesthesia Plan: General   Post-op Pain Management:    Induction: Intravenous  Airway Management Planned: Oral ETT  Additional Equipment:   Intra-op Plan:   Post-operative Plan: Extubation in OR  Informed Consent: I have reviewed the patients History and Physical, chart, labs and discussed the procedure including the risks, benefits and alternatives for the proposed anesthesia with the patient or authorized representative who has indicated his/her understanding and acceptance.   Dental advisory given  Plan Discussed with: CRNA and Surgeon  Anesthesia Plan Comments:         Anesthesia Quick Evaluation  

## 2016-04-10 NOTE — Progress Notes (Signed)
Pharmacy Antibiotic Note  Brittany Mahoney is a 81 y.o. female admitted on 04/09/2016 with UTI.  Pharmacy has been consulted for Cipro dosing.  Plan: Cipro 400mg  IV Q12H.  Height: 5\' 6"  (167.6 cm) Weight: 155 lb (70.3 kg) IBW/kg (Calculated) : 59.3  Temp (24hrs), Avg:97.4 F (36.3 C), Min:97.4 F (36.3 C), Max:97.4 F (36.3 C)   Recent Labs Lab 04/09/16 2129  WBC 9.5  CREATININE 0.75    Estimated Creatinine Clearance: 53.4 mL/min (by C-G formula based on SCr of 0.8 mg/dL).    Allergies  Allergen Reactions  . Codeine   . Morphine And Related     loopy  . Penicillins Hives and Swelling    Has patient had a PCN reaction causing immediate rash, facial/tongue/throat swelling, SOB or lightheadedness with hypotension: Yes Has patient had a PCN reaction causing severe rash involving mucus membranes or skin necrosis: No Has patient had a PCN reaction that required hospitalization No Has patient had a PCN reaction occurring within the last 10 years: No  If all of the above answers are "NO", then may proceed with Cephalosporin use.      Thank you for allowing pharmacy to be a part of this patient's care.  Wynona Neat, PharmD, BCPS  04/10/2016 12:37 AM

## 2016-04-10 NOTE — Op Note (Signed)
NAME:  Brittany Mahoney, Brittany Mahoney NO.:  [id number]  MEDICAL RECORD NO.:  [id number]  LOCATION:  [facility]                         FACILITY:  [facility]  PHYSICIAN:  Lind Guest. Ninfa Linden, M.D.DATE OF BIRTH:  1934-12-01  DATE OF PROCEDURE:  04/10/2016 DATE OF DISCHARGE:                              OPERATIVE REPORT   PREOPERATIVE DIAGNOSIS:  Displaced right intertrochanteric femur/hip fracture.  POSTOPERATIVE DIAGNOSIS:  Displaced right intertrochanteric femur/hip fracture.  PROCEDURE:  Open reduction and internal fixation of right intertrochanteric femur fracture using intramedullary rod and hip screw construct.  IMPLANTS:  [employer] system with size 10 x 38 femoral nail and a 95/90 lag screw, compression screw construct.  SURGEON:  Lind Guest. Ninfa Linden, M.D.  ASSISTANT:  Erskine Emery, PA-C.  ANESTHESIA:  General.  ANTIBIOTICS:  2 g of IV Ancef.  BLOOD LOSS:  200 mL.  COMPLICATIONS:  None.  INDICATIONS:  Ms. Croan is a very pleasant 81 year old female, who unfortunately sustained a mechanical fall yesterday.  She was seen at [facility] and then transferred to [facility] for definitive treatment.  She was seen by the Medicine Service and cleared for surgery.  We talked to her about the risks and benefits of this including the risk of acute blood loss anemia, nerve and vessel injury, fracture, infection, and DVT.  She understands our goals are decreased pain, improved mobility, and overall improved quality of life.  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  She understands the risks and benefits of surgery, as well as her daughter, and they understand the need to proceed with surgery.  PROCEDURE DESCRIPTION:  After informed consent was obtained, appropriate right hip was marked.  She was brought to the operating room.  General anesthesia was obtained while she was on her stretcher.  She was  then placed supine on the fracture table with the right leg in in-line skeletal traction with the perineal post in place and the left hip abducted and flexed out of the field with appropriate leg positioned in leg holder and padding in the popliteal area.  We then were able to pull traction, internally rotate her leg slightly, and assess this under fluoroscopy to anatomically reduce the fracture.  We then prepped the right hip with DuraPrep and sterile drapes.  Time-out was called and she was identified as correct patient and correct right hip.  I then made an incision proximal to the greater trochanter and dissected down the tip of the greater trochanter.  Temporary guide pin was then placed in antegrade fashion down the femoral canal and it was verified under fluoroscopy.  Once this was done, we used initiating reamer at the femoral canal and had already taken a measurement of the femoral canal to choose our 10 x 38 femoral nail.  We passed the nail down the canal without difficulty and then using the outrigger guide, made a separate lateral incision.  We placed temporary guide pin through the lateral cortex of the femur traversing the fracture into a good center position in the femoral head.  We took a measurement off this and chose a 95  mm lag/90 mm compression construct.  We then were able to drill the depth of both of these.  We then placed the 95 mm lag screw and 90 mm compression screw without difficulty and this did compress the fracture. We then removed the outrigger guide.  I put her hip through internal and external rotation under direct fluoroscopy and the fracture was reduced. We then irrigated both small incisions with normal saline solution.  We closed the deep tissue with 0-Vicryl followed by 2-0 Vicryl in the subcutaneous tissue, and interrupted staples on the skin.  Well-padded sterile dressing was applied.  She was then taken off the fracture table, awakened,  extubated, and taken to the recovery room in stable condition.  All final counts were correct.  There were no complications noted.  Of note, Erskine Emery, PA-C assisted in the entire case.  His assistance was crucial for facilitating in all aspects of this case.     Lind Guest. Ninfa Linden, M.D.     CYB/MEDQ  D:  04/10/2016  T:  04/10/2016  Job:  [id number]

## 2016-04-11 ENCOUNTER — Encounter (HOSPITAL_COMMUNITY): Payer: Self-pay | Admitting: Orthopaedic Surgery

## 2016-04-11 LAB — BASIC METABOLIC PANEL
Anion gap: 4 — ABNORMAL LOW (ref 5–15)
BUN: 8 mg/dL (ref 6–20)
CO2: 26 mmol/L (ref 22–32)
Calcium: 8.3 mg/dL — ABNORMAL LOW (ref 8.9–10.3)
Chloride: 105 mmol/L (ref 101–111)
Creatinine, Ser: 0.82 mg/dL (ref 0.44–1.00)
GFR calc Af Amer: >60 mL/min (ref >60)
GFR calc non Af Amer: >60 mL/min (ref >60)
Glucose, Bld: 144 mg/dL — ABNORMAL HIGH (ref 65–99)
Potassium: 3.7 mmol/L (ref 3.5–5.1)
Sodium: 135 mmol/L (ref 135–145)

## 2016-04-11 LAB — URINE CULTURE

## 2016-04-11 LAB — CBC
HCT: 30.4 % — ABNORMAL LOW (ref 36.0–46.0)
Hemoglobin: 10.1 g/dL — ABNORMAL LOW (ref 12.0–15.0)
MCH: 30.5 pg (ref 26.0–34.0)
MCHC: 33.2 g/dL (ref 30.0–36.0)
MCV: 91.8 fL (ref 78.0–100.0)
Platelets: 181 10*3/uL (ref 150–400)
RBC: 3.31 MIL/uL — ABNORMAL LOW (ref 3.87–5.11)
RDW: 12.7 % (ref 11.5–15.5)
WBC: 8.1 10*3/uL (ref 4.0–10.5)

## 2016-04-11 NOTE — Progress Notes (Signed)
Patient ID: Brittany Mahoney, female   DOB: 1934-09-30, 81 y.o.   MRN: [id number]  PROGRESS NOTE    Brittany Mahoney  [id number] DOB: 1934/09/27 DOA: 04/09/2016  PCP: Alonza Bogus, MD   Brief Narrative:  81 y.o. female with no significant past medical history who presented to [facility] status post fall at home. Patient reported severe right hip pain. She she was found to have right hip fracture and subsequently transferred to [facility] for definitive treatment.  Assessment & Plan:   Active Problems:   Closed right hip fracture (HCC) / Mechanical fall - Appreciate orthopedic surgery following - Pt is s/p open reduction and internal fixation of right intertrochanteric femur fracture  - Continue pain management efforts - WBAT per ortho - PT evaluation pending, pt however prefers to go home with [facility] rather than SNF    UTI / leukocytosis - Urinalysis on the admission with nitrites and many bacteria - Continue Cipro  - Urine culture pending  As of this am    DVT prophylaxis: Lovenox subQ  Code Status: full code  Family Communication: Family not at the bedside Disposition Plan: Plan for surgery today   Consultants:   Orthopedic surgery, Dr. Jean Rosenthal  Procedures:   Open reduction and internal fixation of right intertrochanteric femur fracture 04/10/2016   Antimicrobials:   Ciprofloxacin 04/09/2016 -->   Subjective: No overnight events. Pain is controlled.   Objective: Vitals:   04/10/16 1830 04/10/16 1855 04/10/16 1957 04/11/16 0517  BP:  140/63 (!) 142/42 (!) 134/47  Pulse:  88 91 95  Resp:  18  18  Temp: 98.3 F (36.8 C) 97.9 F (36.6 C) 98.6 F (37 C) 98.8 F (37.1 C)  TempSrc:   Oral Oral  SpO2:  94% 98% 93%  Weight:      Height:        Intake/Output Summary (Last 24 hours) at 04/11/16 1005 Last data filed at 04/11/16 0600  Gross per 24 hour  Intake          1503.75 ml  Output             1575 ml  Net           -71.25 ml   Filed  Weights   04/09/16 2018  Weight: 70.3 kg (155 lb)    Examination:  General exam: No distress Respiratory system: No wheezing, no rhonchi  Cardiovascular system: S1 & S2 (+), rate controlled  Gastrointestinal system: Abdomen is nondistended, (+) BS Central nervous system: Nonfocal  Extremities: No swelling, palpable pulses Skin: warm, dry  Psychiatry: Normal mood and behavior   Data Reviewed: I have personally reviewed following labs and imaging studies  CBC:  Recent Labs Lab 04/09/16 2129 04/10/16 0104 04/10/16 0536 04/11/16 0604  WBC 9.5 13.4* 10.5 8.1  NEUTROABS 6.4  --   --   --   HGB 13.2 12.8 12.0 10.1*  HCT 39.0 38.7 36.3 30.4*  MCV 90.3 90.6 91.4 91.8  PLT 258 223 223 0000000   Basic Metabolic Panel:  Recent Labs Lab 04/09/16 2129 04/10/16 0104 04/10/16 0536 04/11/16 0604  NA 137  --  139 135  K 3.2*  --  4.0 3.7  CL 107  --  108 105  CO2 24  --  25 26  GLUCOSE 102*  --  111* 144*  BUN 10  --  8 8  CREATININE 0.75 0.80 0.78 0.82  CALCIUM 9.3  --  8.8* 8.3*  GFR: Estimated Creatinine Clearance: 52.1 mL/min (by C-G formula based on SCr of 0.82 mg/dL). Liver Function Tests: No results for input(s): AST, ALT, ALKPHOS, BILITOT, PROT, ALBUMIN in the last 168 hours. No results for input(s): LIPASE, AMYLASE in the last 168 hours. No results for input(s): AMMONIA in the last 168 hours. Coagulation Profile:  Recent Labs Lab 04/09/16 2129  INR 1.00   Cardiac Enzymes:  Recent Labs Lab 04/09/16 2129  TROPONINI <0.03   BNP (last 3 results) No results for input(s): PROBNP in the last 8760 hours. HbA1C: No results for input(s): HGBA1C in the last 72 hours. CBG: No results for input(s): GLUCAP in the last 168 hours. Lipid Profile: No results for input(s): CHOL, HDL, LDLCALC, TRIG, CHOLHDL, LDLDIRECT in the last 72 hours. Thyroid Function Tests: No results for input(s): TSH, T4TOTAL, FREET4, T3FREE, THYROIDAB in the last 72 hours. Anemia Panel: No  results for input(s): VITAMINB12, FOLATE, FERRITIN, TIBC, IRON, RETICCTPCT in the last 72 hours. Urine analysis:    Component Value Date/Time   COLORURINE YELLOW 04/09/2016 2210   APPEARANCEUR CLEAR 04/09/2016 2210   LABSPEC 1.010 04/09/2016 2210   PHURINE 6.5 04/09/2016 2210   GLUCOSEU NEGATIVE 04/09/2016 2210   HGBUR SMALL (A) 04/09/2016 2210   BILIRUBINUR NEGATIVE 04/09/2016 2210   KETONESUR NEGATIVE 04/09/2016 2210   PROTEINUR NEGATIVE 04/09/2016 2210   NITRITE POSITIVE (A) 04/09/2016 2210   LEUKOCYTESUR NEGATIVE 04/09/2016 2210   Sepsis Labs: @LABRCNTIP (procalcitonin:4,lacticidven:4)   Surgical pcr screen     Status: Abnormal   Collection Time: 04/10/16  1:41 AM  Result Value Ref Range Status   MRSA, PCR POSITIVE (A) NEGATIVE Final   Staphylococcus aureus POSITIVE (A) NEGATIVE Final      Radiology Studies: Dg Chest 1 View Result Date: 04/09/2016 CLINICAL DATA: No acute disease. Electronically Signed   By: Inge Rise M.D.   On: 04/09/2016 21:10   Dg Hip Unilat  With Pelvis 2-3 Views Right Result Date: 04/09/2016 CLINICAL DATA:  Acute right intertrochanteric fracture.      Scheduled Meds: . aspirin EC  325 mg Oral Q breakfast  . Chlorhexidine Gluconate Cloth  6 each Topical Q0600  . ciprofloxacin  400 mg Intravenous Q12H  . docusate sodium  100 mg Oral BID  . enoxaparin (LOVENOX) injection  40 mg Subcutaneous Q24H  . feeding supplement  1 Container Oral BID BM  . latanoprost  1 drop Both Eyes QHS  . mupirocin ointment  1 application Nasal BID   Continuous Infusions: . sodium chloride 75 mL/hr at 04/10/16 1314  . sodium chloride 75 mL/hr at 04/11/16 0539     LOS: 1 day    Time spent: 15 minutes  Greater than 50% of the time spent on counseling and coordinating the care.   Leisa Lenz, MD [facility] Pager [phone]  If 7PM-7AM, please contact night-coverage [email or web address] Password [email or web address] 04/11/2016, 10:05 AM

## 2016-04-11 NOTE — Progress Notes (Signed)
Subjective: 1 Day Post-Op Procedure(s) (LRB): INTRAMEDULLARY (IM) NAIL RIGHT HIP FRACTURE (Right) Patient reports pain as moderate.    Objective: Vital signs in last 24 hours: Temp:  [97.9 F (36.6 C)-98.8 F (37.1 C)] 98.8 F (37.1 C) (08/02 0517) Pulse Rate:  [83-95] 95 (08/02 0517) Resp:  [18] 18 (08/02 0517) BP: (99-155)/(42-78) 134/47 (08/02 0517) SpO2:  [93 %-98 %] 93 % (08/02 0517)  Intake/Output from previous day: 08/01 0701 - 08/02 0700 In: 1503.8 [I.V.:1253.8; IV Piggyback:250] Out: 1575 [Urine:1375; Blood:200] Intake/Output this shift: No intake/output data recorded.   Recent Labs  04/09/16 2129 04/10/16 0104 04/10/16 0536 04/11/16 0604  HGB 13.2 12.8 12.0 10.1*    Recent Labs  04/10/16 0536 04/11/16 0604  WBC 10.5 8.1  RBC 3.97 3.31*  HCT 36.3 30.4*  PLT 223 181    Recent Labs  04/10/16 0536 04/11/16 0604  NA 139 135  K 4.0 3.7  CL 108 105  CO2 25 26  BUN 8 8  CREATININE 0.78 0.82  GLUCOSE 111* 144*  CALCIUM 8.8* 8.3*    Recent Labs  04/09/16 2129  INR 1.00    Sensation intact distally Intact pulses distally Dorsiflexion/Plantar flexion intact Incision: scant drainage  Assessment/Plan: 1 Day Post-Op Procedure(s) (LRB): INTRAMEDULLARY (IM) NAIL RIGHT HIP FRACTURE (Right) Up with therapy  WBAT right hip Aspirin for DVT coverage  Johnathon Mittal Y 04/11/2016, 7:33 AM

## 2016-04-11 NOTE — Evaluation (Signed)
Physical Therapy Evaluation Patient Details Name: Brittany Mahoney MRN: [id number] DOB: [birth year] Today's Date: 04/11/2016   History of Present Illness  81 y.o. female now s/p Rt hip fracture following a fall at home and resulting ORIF on 04/10/16. PMH: hepatitis, uterine cancer.   Clinical Impression  Pt mobilizing slowly during PT initial evaluation. Currently pt is requiring max assist for supine to sit and +2 total assist for sit/supine. Pt unable to stand despite multiple attempts. Based upon the patient's current mobility, recommending SNF for further rehabilitation. Pt and daughter in agreement. PT to continue to follow to progress mobility.     Follow Up Recommendations SNF;Supervision for mobility/OOB    Equipment Recommendations  None recommended by PT (to be addressed at next venue)    Recommendations for Other Services       Precautions / Restrictions Precautions Precautions: Fall Restrictions Weight Bearing Restrictions: Yes RLE Weight Bearing: Weight bearing as tolerated      Mobility  Bed Mobility Overal bed mobility: Needs Assistance Bed Mobility: Supine to Sit;Sit to Supine     Supine to sit: Max assist;HOB elevated Sit to supine: +2 for physical assistance;Total assist   General bed mobility comments: Heavy cues needed for all mobility, pt needing assistance with moving bilateral LEs.   Transfers                 General transfer comment: Attempted sit/stand from EOB X4, pt unable to achieve standing. Pt reports that she is in too much pain to stand.   Ambulation/Gait                Stairs            Wheelchair Mobility    Modified Rankin (Stroke Patients Only)       Balance Overall balance assessment: Needs assistance Sitting-balance support: Single extremity supported Sitting balance-Leahy Scale: Poor                                       Pertinent Vitals/Pain Pain Assessment: No/denies pain    Home  Living Family/patient expects to be discharged to:: Skilled nursing facility Living Arrangements: Children Available Help at Discharge: Family;Available PRN/intermittently Type of Home: House Home Access: Stairs to enter Entrance Stairs-Rails: None Entrance Stairs-Number of Steps: 1 Home Layout: One level Home Equipment: Bedside commode;Walker - 4 wheels;Kasandra Knudsen - single point Additional Comments: Lives with daughter but she works during the day.    Prior Function Level of Independence: Independent with assistive device(s)         Comments: using SPC for mobility, independent with dressing/grooming     Hand Dominance        Extremity/Trunk Assessment   Upper Extremity Assessment: Defer to OT evaluation           Lower Extremity Assessment: RLE deficits/detail RLE Deficits / Details: poor muscle activation and functional movement through Rt LE, reportedly due to pain.        Communication   Communication: No difficulties  Cognition Arousal/Alertness: Awake/alert Behavior During Therapy: WFL for tasks assessed/performed Overall Cognitive Status: Within Functional Limits for tasks assessed                      General Comments      Exercises        Assessment/Plan    PT Assessment Patient needs continued PT services  PT Diagnosis  Difficulty walking   PT Problem List Decreased strength;Decreased range of motion;Decreased activity tolerance;Decreased balance;Decreased mobility  PT Treatment Interventions DME instruction;Gait training;Functional mobility training;Stair training;Therapeutic activities;Therapeutic exercise;Patient/family education   PT Goals (Current goals can be found in the Care Plan section) Acute Rehab PT Goals Patient Stated Goal: get back home PT Goal Formulation: With patient Time For Goal Achievement: 04/25/16 Potential to Achieve Goals: Good    Frequency Min 3X/week   Barriers to discharge        Co-evaluation                End of Session Equipment Utilized During Treatment: Gait belt Activity Tolerance: Patient limited by pain Patient left: in bed;with call bell/phone within reach;with family/visitor present;with nursing/sitter in room Nurse Communication: Mobility status;Weight bearing status         Time: 1136-1208 PT Time Calculation (min) (ACUTE ONLY): 32 min   Charges:   PT Evaluation $PT Eval Moderate Complexity: 1 Procedure PT Treatments $Therapeutic Activity: 8-22 mins   PT G Codes:        Cassell Clement, PT, CSCS Pager [phone] Office [phone]  04/11/2016, 12:37 PM

## 2016-04-11 NOTE — Evaluation (Signed)
Occupational Therapy Evaluation Patient Details Name: Brittany Mahoney MRN: [id number] DOB: May 30, 1935 Today's Date: 04/11/2016    History of Present Illness 81 y.o. female now s/p Rt hip fracture following a fall at home and resulting ORIF on 04/10/16. PMH: hepatitis, uterine cancer.    Clinical Impression   Pt was independent in ADL and IADL prior to admission[place]. Pt presents with generalized weakness, R hip pain and poor sitting balance. She was not able to stand this visit despite her best effort. Pt will need post acute rehab in SNF. Will follow acutely.    Follow Up Recommendations  SNF;Supervision/Assistance - 24 hour    Equipment Recommendations   (defer to next venue)    Recommendations for Other Services       Precautions / Restrictions Precautions Precautions: Fall Restrictions Weight Bearing Restrictions: Yes RLE Weight Bearing: Weight bearing as tolerated      Mobility Bed Mobility Overal bed mobility: Needs Assistance Bed Mobility: Supine to Sit;Sit to Supine;Rolling Rolling: Mod assist   Supine to sit: Max assist;HOB elevated Sit to supine: Max assist   General bed mobility comments: Cues for technique, assist for R LE to EOB, assist for B LEs back into bed, used bed pad to assist, heavy reliance on rail   Transfers   Equipment used: Rolling walker (2 wheeled)             General transfer comment: unable to stand    Balance     Sitting balance-Leahy Scale: Poor (attempts to unload R hip, leans to L side)                                      ADL Overall ADL's : Needs assistance/impaired Eating/Feeding: Independent;Bed level   Grooming: Wash/dry hands;Wash/dry face;Set up;Bed level   Upper Body Bathing: Minimal assitance;Sitting   Lower Body Bathing: Bed level;Total assistance   Upper Body Dressing : Minimal assistance;Bed level   Lower Body Dressing: Total assistance;Bed level        Toileting- Clothing Manipulation and Hygiene: Total assistance;Bed level         General ADL Comments: Pt with urinary incontinence. Changed bed linens and performed pericare. Encouraged pt to call for assist when she is wet.     Vision     Perception     Praxis      Pertinent Vitals/Pain Pain Assessment: Faces Faces Pain Scale: Hurts even more Pain Location: R hip with movement Pain Descriptors / Indicators: Grimacing;Guarding;Aching Pain Intervention(s): Limited activity within patient's tolerance;Monitored during session;Premedicated before session;Repositioned;Ice applied     Hand Dominance Right   Extremity/Trunk Assessment Upper Extremity Assessment Upper Extremity Assessment: Overall WFL for tasks assessed   Lower Extremity Assessment Lower Extremity Assessment: Defer to PT evaluation       Communication Communication Communication: No difficulties   Cognition Arousal/Alertness: Awake/alert Behavior During Therapy: WFL for tasks assessed/performed Overall Cognitive Status: Within Functional Limits for tasks assessed                     General Comments       Exercises       Shoulder Instructions      Home Living Family/patient expects to be discharged to:: Skilled nursing facility Living Arrangements: Children (daughter) Available Help at Discharge: Family;Available PRN/intermittently Type of Home: House Home Access: Stairs to enter Entrance  Stairs-Number of Steps: 1 Entrance Stairs-Rails: None Home Layout: One level               Home Equipment: Bedside commode;Walker - 4 wheels;Kasandra Knudsen - single point   Additional Comments: Lives with daughter but she works during the day.      Prior Functioning/Environment Level of Independence: Independent with assistive device(s)        Comments: using SPC for mobility, independent otherwise, pt likes to garden    OT Diagnosis: Generalized weakness;Acute pain   OT Problem List:  Decreased strength;Decreased activity tolerance;Impaired balance (sitting and/or standing);Decreased knowledge of use of DME or AE;Pain   OT Treatment/Interventions: Self-care/ADL training;DME and/or AE instruction;Patient/family education;Balance training;Therapeutic activities    OT Goals(Current goals can be found in the care plan section) Acute Rehab OT Goals Patient Stated Goal: get back home OT Goal Formulation: With patient Time For Goal Achievement: 04/25/16 Potential to Achieve Goals: Good ADL Goals Pt Will Perform Grooming: with min assist;standing Pt Will Perform Upper Body Bathing: with set-up;sitting Pt Will Perform Upper Body Dressing: with set-up;sitting Pt Will Transfer to Toilet: with min assist;ambulating;bedside commode (over toilet) Pt Will Perform Toileting - Clothing Manipulation and hygiene: with min assist;sit to/from stand Additional ADL Goal #1: Pt will perform bed mobility with min assist in preparation for ADL.  OT Frequency: Min 2X/week   Barriers to D/C: Decreased caregiver support          Co-evaluation              End of Session Equipment Utilized During Treatment: Rolling walker;Gait belt  Activity Tolerance: Patient limited by pain Patient left: in bed;with call bell/phone within reach   Time: 1451-1550 OT Time Calculation (min): 59 min Charges:  OT General Charges $OT Visit: 1 Procedure OT Evaluation $OT Eval Moderate Complexity: 1 Procedure OT Treatments $Self Care/Home Management : 23-37 mins $Therapeutic Activity: 8-22 mins G-Codes:    Malka So 04/11/2016, 4:03 PM  [phone]

## 2016-04-12 DIAGNOSIS — W19XXXS Unspecified fall, sequela: Secondary | ICD-10-CM

## 2016-04-12 LAB — BASIC METABOLIC PANEL
Anion gap: 5 (ref 5–15)
BUN: 7 mg/dL (ref 6–20)
CO2: 25 mmol/L (ref 22–32)
Calcium: 8.5 mg/dL — ABNORMAL LOW (ref 8.9–10.3)
Chloride: 108 mmol/L (ref 101–111)
Creatinine, Ser: 0.7 mg/dL (ref 0.44–1.00)
GFR calc Af Amer: >60 mL/min (ref >60)
GFR calc non Af Amer: >60 mL/min (ref >60)
Glucose, Bld: 122 mg/dL — ABNORMAL HIGH (ref 65–99)
Potassium: 4.5 mmol/L (ref 3.5–5.1)
Sodium: 138 mmol/L (ref 135–145)

## 2016-04-12 LAB — CBC
HCT: 28.9 % — ABNORMAL LOW (ref 36.0–46.0)
Hemoglobin: 9.4 g/dL — ABNORMAL LOW (ref 12.0–15.0)
MCH: 29.8 pg (ref 26.0–34.0)
MCHC: 32.5 g/dL (ref 30.0–36.0)
MCV: 91.7 fL (ref 78.0–100.0)
Platelets: 178 10*3/uL (ref 150–400)
RBC: 3.15 MIL/uL — ABNORMAL LOW (ref 3.87–5.11)
RDW: 12.6 % (ref 11.5–15.5)
WBC: 9.7 10*3/uL (ref 4.0–10.5)

## 2016-04-12 MED ORDER — DOCUSATE SODIUM 100 MG PO CAPS: 100.0000 mg | ORAL_CAPSULE | Freq: Two times a day (BID) | ORAL | 0 refills | Status: DC | PRN

## 2016-04-12 MED ORDER — ASPIRIN 325 MG PO TBEC: 325.0000 mg | DELAYED_RELEASE_TABLET | Freq: Every day | ORAL | 0 refills | Status: DC

## 2016-04-12 MED ORDER — ACETAMINOPHEN 325 MG PO TABS: 650.0000 mg | ORAL_TABLET | Freq: Four times a day (QID) | ORAL | 0 refills | Status: DC | PRN

## 2016-04-12 NOTE — Discharge Instructions (Addendum)
Hip Fracture A hip fracture is a fracture of the upper part of your thigh bone (femur).  CAUSES A hip fracture is caused by a direct blow to the side of your hip. This is usually the result of a fall but can occur in other circumstances, such as an automobile accident. RISK FACTORS There is an increased risk of hip fractures in people with:  An unsteady walking pattern (gait) and those with conditions that contribute to poor balance, such as Parkinson's disease or dementia.  Osteopenia and osteoporosis.  Cancer that spreads to the leg bones.  Certain metabolic diseases. SYMPTOMS  Symptoms of hip fracture include:  Pain over the injured hip.  Inability to put weight on the leg in which the fracture occurred (although, some patients are able to walk after a hip fracture).  Toes and foot of the affected leg point outward when you lie down. DIAGNOSIS A physical exam can determine if a hip fracture is likely to have occurred. X-ray exams are needed to confirm the fracture and to look for other injuries. The X-ray exam can help to determine the type of hip fracture. Rarely, the fracture is not visible on an X-ray image and a CT scan or MRI will have to be done. TREATMENT  The treatment for a fracture is usually surgery. This means using a screw, nail, or rod to hold the bones in place.  HOME CARE INSTRUCTIONS Take all medicines as directed by your health care provider. SEEK MEDICAL CARE IF: Pain continues, even after taking pain medicine. MAKE SURE YOU:  Understand these instructions.   Will watch your condition.  Will get help right away if you are not doing well or get worse.   This information is not intended to replace advice given to you by your health care provider. Make sure you discuss any questions you have with your health care provider.   Document Released: 08/27/2005 Document Revised: 09/01/2013 Document Reviewed: 04/08/2013 Elsevier Interactive Patient Education 2016  Pinch PUT FULL WEIGHT AS TOLERATED ON YOUR RIGHT HIP. YOU CAN GET YOUR INCISIONS WET DAILY IN THE SHOWER. NEW DRY DRESSINGS DAILY AS NEEDED.

## 2016-04-12 NOTE — Progress Notes (Signed)
Physical Therapy Treatment Patient Details Name: Brittany Mahoney MRN: [id number] DOB: May 05, 1935 Today's Date: 04/12/2016    History of Present Illness 81 y.o. female now s/p Rt hip fracture following a fall at home and resulting ORIF on 04/10/16. PMH: hepatitis, uterine cancer.     PT Comments    Pt progressing slowly with mobility, +2 assistance for bed mobility and transfers. Continue to recommend SNF for further rehabilitation following acute stay.   Follow Up Recommendations  SNF;Supervision for mobility/OOB     Equipment Recommendations  None recommended by PT    Recommendations for Other Services       Precautions / Restrictions Precautions Precautions: Fall Restrictions Weight Bearing Restrictions: Yes RLE Weight Bearing: Weight bearing as tolerated    Mobility  Bed Mobility Overal bed mobility: Needs Assistance Bed Mobility: Supine to Sit     Supine to sit: +2 for physical assistance;Mod assist     General bed mobility comments: assist provided at LEs and trunk, cues for pt to assist with rail.   Transfers Overall transfer level: Needs assistance Equipment used: Rolling walker (2 wheeled) Transfers: Sit to/from Stand Sit to Stand: +2 physical assistance;Mod assist Stand pivot transfers: +2 physical assistance;Max assist       General transfer comment: Physical assist and cues needed for transfer. Pt placing minimal weight through Rt LE, pivoting on LLE for transfer.   Ambulation/Gait                 Stairs            Wheelchair Mobility    Modified Rankin (Stroke Patients Only)       Balance Overall balance assessment: Needs assistance Sitting-balance support: No upper extremity supported Sitting balance-Leahy Scale: Fair     Standing balance support: Bilateral upper extremity supported Standing balance-Leahy Scale: Poor Standing balance comment: requiring physical assist and use of rw                    Cognition  Arousal/Alertness: Awake/alert Behavior During Therapy: WFL for tasks assessed/performed Overall Cognitive Status: Within Functional Limits for tasks assessed                      Exercises      General Comments        Pertinent Vitals/Pain Pain Assessment: Faces Faces Pain Scale: Hurts even more Pain Location: Rt hip Pain Descriptors / Indicators: Grimacing;Guarding Pain Intervention(s): Limited activity within patient's tolerance;Monitored during session;Repositioned    Home Living                      Prior Function            PT Goals (current goals can now be found in the care plan section) Acute Rehab PT Goals Patient Stated Goal: not expressed PT Goal Formulation: With patient Time For Goal Achievement: 04/25/16 Potential to Achieve Goals: Good Progress towards PT goals: Progressing toward goals    Frequency  Min 3X/week    PT Plan Current plan remains appropriate    Co-evaluation             End of Session Equipment Utilized During Treatment: Gait belt Activity Tolerance: Patient limited by pain Patient left: in chair;with call bell/phone within reach;with family/visitor present     Time: [id number] PT Time Calculation (min) (ACUTE ONLY): 22 min  Charges:  $Therapeutic Activity: 8-22 mins  G Codes:      Cassell Clement, PT, CSCS Pager [phone] Office [phone]  04/12/2016, 3:09 PM

## 2016-04-12 NOTE — Discharge Summary (Addendum)
Physician Discharge Summary  Brittany Mahoney [id number] DOB: [birth year] DOA: 04/09/2016  PCP: Alonza Bogus, MD  Admit date: 04/09/2016 Discharge date: 04/12/2016  Recommendations for Outpatient Follow-up:  1. Aspirin for DVT coverage 325 mg daily  Discharge Diagnoses:  Active Problems:   Closed right hip fracture (HCC)   Hip fracture (HCC)    Discharge Condition: stable   Diet recommendation: as tolerated   History of present illness:  81 y.o.female with no significant past medical history who presented to [facility] status post fall at home. Patient reported severe right hip pain. She she was found to have right hip fracture and subsequently transferred to [facility] for definitive treatment. She underwent ORIF with no subsequent complications.   Hospital Course:    Assessment & Plan:   Active Problems:   Closed right hip fracture (HCC) / Mechanical fall - Appreciate orthopedic surgery following - Pt is s/p open reduction and internal fixation of right intertrochanteric femur fracture  - WBAT per ortho - PT evaluation - SNF recommended, pt agreeable to this - Aspirin on discharge for DVT co vergae     UTI / leukocytosis - Urinalysis on the admission with nitrites and many bacteria - Urine culture with multiple species non predominant - Stop cipro today    DVT prophylaxis: Lovenox subQ  Code Status: full code  Family Communication: Family not at the bedside    Consultants:   Orthopedic surgery, Dr. Jean Rosenthal  Procedures:   Open reduction and internal fixation of right intertrochanteric femur fracture 04/10/2016   Antimicrobials:   Ciprofloxacin 04/09/2016 --> 04/12/2016    Signed:  Leisa Lenz, MD  [facility] 04/12/2016, 8:57 AM  Pager #: [phone]  Time spent in minutes: less than 30 minutes   Discharge Exam: Vitals:   04/11/16 2047 04/12/16 0603  BP: (!) 104/42 138/63  Pulse: (!) 101 (!) 107  Resp:  18 18  Temp: 98.6 F (37 C) 98.8 F (37.1 C)   Vitals:   04/11/16 0517 04/11/16 1736 04/11/16 2047 04/12/16 0603  BP: (!) 134/47 (!) 136/44 (!) 104/42 138/63  Pulse: 95 (!) 101 (!) 101 (!) 107  Resp: 18 18 18 18   Temp: 98.8 F (37.1 C) 98.6 F (37 C) 98.6 F (37 C) 98.8 F (37.1 C)  TempSrc: Oral Oral Oral Oral  SpO2: 93% 96% 93% 94%  Weight:      Height:        General: Pt is alert, follows commands appropriately, not in acute distress Cardiovascular: Regular rate and rhythm, S1/S2 + Respiratory: Clear to auscultation bilaterally, no wheezing, no crackles, no rhonchi Abdominal: Soft, non tender, non distended, bowel sounds +, no guarding Extremities: no edema, no cyanosis, pulses palpable bilaterally DP and PT Neuro: Grossly nonfocal  Discharge Instructions  Discharge Instructions    Call MD for:  difficulty breathing, headache or visual disturbances    Complete by:  As directed   Call MD for:  persistant dizziness or light-headedness    Complete by:  As directed   Call MD for:  redness, tenderness, or signs of infection (pain, swelling, redness, odor or green/yellow discharge around incision site)    Complete by:  As directed   Call MD for:  severe uncontrolled pain    Complete by:  As directed   Diet - low sodium heart healthy    Complete by:  As directed   Increase activity slowly    Complete by:  As directed  Medication List    STOP taking these medications   ALEVE 220 MG tablet Generic drug:  naproxen sodium     TAKE these medications   acetaminophen 325 MG tablet Commonly known as:  TYLENOL Take 2 tablets (650 mg total) by mouth every 6 (six) hours as needed for mild pain (or Fever >/= 101).   aspirin 325 MG EC tablet Take 1 tablet (325 mg total) by mouth daily.   CORTIZONE-10 1 % ointment Generic drug:  hydrocortisone Apply 1 application topically daily as needed for itching (for irritation associated with incontinence).   docusate sodium 100 MG  capsule Commonly known as:  COLACE Take 1 capsule (100 mg total) by mouth 2 (two) times daily as needed for mild constipation.   LUMIGAN 0.01 % Soln Generic drug:  bimatoprost INSTILL 1 DROP IN INTO BOTH EYES AT BEDTIME      Follow-up Information    HAWKINS,EDWARD L, MD. Schedule an appointment as soon as possible for a visit in 2 week(s).   Specialty:  Pulmonary Disease Contact information: [address] [place] [address] [phone]            The results of significant diagnostics from this hospitalization (including imaging, microbiology, ancillary and laboratory) are listed below for reference.    Significant Diagnostic Studies: Dg Chest 1 View  Result Date: 04/09/2016 CLINICAL DATA:  Status post trip and fall tonight with a right hip fracture. Preoperative examination. EXAM: CHEST 1 VIEW COMPARISON:  PA and lateral chest 06/22/2014. FINDINGS: The lungs are clear. Heart size is normal. The aorta is tortuous. No pneumothorax or pleural effusion. No focal bony abnormality. Scoliosis noted. IMPRESSION: No acute disease. Electronically Signed   By: Inge Rise M.D.   On: 04/09/2016 21:10   Dg C-arm 1-60 Min  Result Date: 04/10/2016 CLINICAL DATA:  ORIF intertrochanteric right femoral neck fracture. EXAM: Operative RIGHT FEMUR 2 VIEWS COMPARISON:  Right hip x-rays yesterday. FINDINGS: Four spot images from the C-arm fluoroscopic device, AP and lateral views of the right femur are submitted for interpretation postoperatively. ORIF of the intertrochanteric right femoral neck fracture with compression screws placed through an intramedullary nail. Alignment appears anatomic. The radiologic technologist documented 1 min 5 sec of fluoroscopy time. IMPRESSION: Anatomic alignment post ORIF of the intertrochanteric right femoral neck fracture. Electronically Signed   By: Evangeline Dakin M.D.   On: 04/10/2016 17:03   Mm Screening Breast Tomo Bilateral  Result  Date: 03/19/2016 CLINICAL DATA:  Screening. EXAM: 2D DIGITAL SCREENING BILATERAL MAMMOGRAM WITH CAD AND ADJUNCT TOMO COMPARISON:  Previous exam(s). ACR Breast Density Category b: There are scattered areas of fibroglandular density. FINDINGS: There are no findings suspicious for malignancy. Images were processed with CAD. IMPRESSION: No mammographic evidence of malignancy. A result letter of this screening mammogram will be mailed directly to the patient. RECOMMENDATION: Screening mammogram in one year. (Code:SM-B-01Y) BI-RADS CATEGORY  1: Negative. Electronically Signed   By: Ammie Ferrier M.D.   On: 03/19/2016 16:37   Dg Hip Unilat  With Pelvis 2-3 Views Right  Result Date: 04/09/2016 CLINICAL DATA:  Status post trip and fall tonight while taking garbage out. Right hip pain. Initial encounter. EXAM: DG HIP (WITH OR WITHOUT PELVIS) 2-3V RIGHT COMPARISON:  None. FINDINGS: The patient has an acute right intertrochanteric fracture. No other acute bony or joint abnormality is seen. Bones somewhat osteopenic. IMPRESSION: Acute right intertrochanteric fracture. Electronically Signed   By: Inge Rise M.D.   On: 04/09/2016  21:10   Dg Femur, Min 2 Views Right  Result Date: 04/10/2016 CLINICAL DATA:  ORIF intertrochanteric right femoral neck fracture. EXAM: Operative RIGHT FEMUR 2 VIEWS COMPARISON:  Right hip x-rays yesterday. FINDINGS: Four spot images from the C-arm fluoroscopic device, AP and lateral views of the right femur are submitted for interpretation postoperatively. ORIF of the intertrochanteric right femoral neck fracture with compression screws placed through an intramedullary nail. Alignment appears anatomic. The radiologic technologist documented 1 min 5 sec of fluoroscopy time. IMPRESSION: Anatomic alignment post ORIF of the intertrochanteric right femoral neck fracture. Electronically Signed   By: Evangeline Dakin M.D.   On: 04/10/2016 17:03    Microbiology: Recent Results (from the past  240 hour(s))  Urine culture     Status: Abnormal   Collection Time: 04/09/16 10:15 PM  Result Value Ref Range Status   Specimen Description URINE, CLEAN CATCH  Final   Special Requests NONE  Final   Culture MULTIPLE SPECIES PRESENT, SUGGEST RECOLLECTION (A)  Final   Report Status 04/11/2016 FINAL  Final  Surgical pcr screen     Status: Abnormal   Collection Time: 04/10/16  1:41 AM  Result Value Ref Range Status   MRSA, PCR POSITIVE (A) NEGATIVE Final    Comment: ATTEMPTS TO [place] RESULTS X4 AT [id number] AND 0517 A BROWNING RESULT CALLED TO, READ BACK BY AND VERIFIED WITH: Cherlyn Roberts AT [id number] ON [id number] BY S. YARBROUGH    Staphylococcus aureus POSITIVE (A) NEGATIVE Final    Comment:        The Xpert SA Assay (FDA approved for NASAL specimens in patients over 63 years of age), is one component of a comprehensive surveillance program.  Test performance has been validated by [facility] for patients greater than or equal to 76 year old. It is not intended to diagnose infection nor to guide or monitor treatment.      Labs: Basic Metabolic Panel:  Recent Labs Lab 04/09/16 2129 04/10/16 0104 04/10/16 0536 04/11/16 0604 04/12/16 0415  NA 137  --  139 135 138  K 3.2*  --  4.0 3.7 4.5  CL 107  --  108 105 108  CO2 24  --  25 26 25   GLUCOSE 102*  --  111* 144* 122*  BUN 10  --  8 8 7   CREATININE 0.75 0.80 0.78 0.82 0.70  CALCIUM 9.3  --  8.8* 8.3* 8.5*   Liver Function Tests: No results for input(s): AST, ALT, ALKPHOS, BILITOT, PROT, ALBUMIN in the last 168 hours. No results for input(s): LIPASE, AMYLASE in the last 168 hours. No results for input(s): AMMONIA in the last 168 hours. CBC:  Recent Labs Lab 04/09/16 2129 04/10/16 0104 04/10/16 0536 04/11/16 0604 04/12/16 0415  WBC 9.5 13.4* 10.5 8.1 9.7  NEUTROABS 6.4  --   --   --   --   HGB 13.2 12.8 12.0 10.1* 9.4*  HCT 39.0 38.7 36.3 30.4* 28.9*  MCV 90.3 90.6 91.4 91.8 91.7  PLT 258 223 223 181 178    Cardiac Enzymes:  Recent Labs Lab 04/09/16 2129  TROPONINI <0.03   BNP: BNP (last 3 results) No results for input(s): BNP in the last 8760 hours.  ProBNP (last 3 results) No results for input(s): PROBNP in the last 8760 hours.  CBG: No results for input(s): GLUCAP in the last 168 hours.

## 2016-04-12 NOTE — Clinical Social Work Note (Signed)
Clinical Social Work Assessment  Patient Details  Name: Brittany Mahoney MRN: [id number] Date of Birth: April 08, 1935  Date of referral:  04/12/16               Reason for consult:  Facility Placement                Permission sought to share information with:   (Facilities) Permission granted to share information::   (Facilities)  Name::        Agency::     Relationship::     Contact Information:     Housing/Transportation Living arrangements for the past 2 months:  Single Family Home (Daughter states pt lives with her in [place]) Source of Information:  Adult Children Patient Interpreter Needed:  None Criminal Activity/Legal Involvement Pertinent to Current Situation/Hospitalization:  No - Comment as needed Significant Relationships:  Adult Children Lives with:  Adult Children Do you feel safe going back to the place where you live?   (Daughter states that she cannot take care of pt at home and that she prefers facility.) Need for family participation in patient care:  Yes (Comment) (Daughter states she is primary support.)  Care giving concerns:  Daughter states that she does not feel she will be able to care for patient. Daughter states that she wants patient to go to a facility.  Social Worker assessment / plan:  SW attempted to speak with patient at bedside. However, she is receiving PT at this time. SW spoke with daughter.  Daughter states that pt lives with her at home in [place]. She states that prior to coming to [facility] that pt was able to complete ADL's independently. However, she states pt needs assistance now. Daughter states that she is the patient's primary support. She states that pt has had no falls since Monday night.  Employment status:  Retired Forensic scientist:  Medicare PT Recommendations:  [place] / Referral to community resources:  Acute Rehab (Daughter states to refer pt to facilities, but prefers to go to a facility in  [place].)  Patient/Family's Response to care:  Appropriate.  Patient/Family's Understanding of and Emotional Response to Diagnosis, Current Treatment, and Prognosis:  Daughter states that she has no questions at this time.  Emotional Assessment Appearance:  Appears stated age Attitude/Demeanor/Rapport:   (Not able to assess due to pt reveivng PT at this timel) Affect (typically observed):   (Patient receiving PT at this time.) Orientation:   (Unable to assess.) Alcohol / Substance use:  Not Applicable Psych involvement (Current and /or in the community):  No (Comment)  Discharge Needs  Concerns to be addressed:  Adjustment to Illness Readmission within the last 30 days:  No Current discharge risk:  None Barriers to Discharge:  No Barriers Identified   Bernita Buffy 04/12/2016, 3:18 PM

## 2016-04-12 NOTE — NC FL2 (Signed)
[place] MEDICAID FL2 LEVEL OF CARE SCREENING TOOL     IDENTIFICATION  Patient Name: Brittany Mahoney Birthdate: 1934-10-29 Sex: female Admission Date (Current Location): 04/09/2016  [facility] and [place] Number:  [employer] and Address:  The [place] [place] [address] [place], [place] [address]      Provider Number: [id number]  Attending Physician Name and Address:  Robbie Lis, MD  Relative Name and Phone Number:       Current Level of Care: Hospital Recommended Level of Care: [place] Prior Approval Number:    Date Approved/Denied:   PASRR Number:    Discharge Plan: SNF    Current Diagnoses: Patient Active Problem List   Diagnosis Date Noted  . Closed right hip fracture ([place]) 04/09/2016  . Hip fracture ([place]) 04/09/2016  . 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  . Endometrial cancer, grade I ([place]) 03/21/2015  . Routine gynecological examination 03/15/2014  . Screening for malignant neoplasm of the cervix 03/15/2014  . Hx of cancer of endometrium 03/06/2013    Orientation RESPIRATION BLADDER Height & Weight          Continent Weight: 155 lb (70.3 kg) Height:  5\' 6"  (167.6 cm)  BEHAVIORAL SYMPTOMS/MOOD NEUROLOGICAL BOWEL NUTRITION STATUS      Continent    AMBULATORY STATUS COMMUNICATION OF NEEDS Skin   Independent Verbally Normal                       Personal Care Assistance Level of Assistance  Bathing, Dressing Bathing Assistance: Limited assistance   Dressing Assistance: Limited assistance     Functional Limitations Info             SPECIAL CARE FACTORS FREQUENCY                       Contractures      Additional Factors Info                  Current Medications (04/12/2016):  This is the current hospital active medication list Current Facility-Administered Medications  Medication Dose Route Frequency Provider Last Rate Last Dose  . 0.9 %  sodium chloride infusion   Intravenous  Continuous Francine Graven, DO 75 mL/hr at 04/10/16 1314    . 0.9 %  sodium chloride infusion   Intravenous Continuous Mcarthur Rossetti, MD 75 mL/hr at 04/11/16 2237    . acetaminophen (TYLENOL) tablet 650 mg  650 mg Oral Q6H PRN Mcarthur Rossetti, MD       Or  . acetaminophen (TYLENOL) suppository 650 mg  650 mg Rectal Q6H PRN Mcarthur Rossetti, MD      . aspirin EC tablet 325 mg  325 mg Oral Q breakfast Mcarthur Rossetti, MD   325 mg at 04/12/16 0818  . Chlorhexidine Gluconate Cloth 2 % PADS 6 each  6 each Topical Q0600 Robbie Lis, MD   6 each at 04/12/16 [phone]  . ciprofloxacin (CIPRO) IVPB 400 mg  400 mg Intravenous Q12H Robbie Lis, MD   400 mg at 04/12/16 0442  . docusate sodium (COLACE) capsule 100 mg  100 mg Oral BID Mcarthur Rossetti, MD   100 mg at 04/12/16 0818  . enoxaparin (LOVENOX) injection 40 mg  40 mg Subcutaneous Q24H Oswald Hillock, MD   40 mg at 04/12/16 0818  . feeding supplement (BOOST / RESOURCE BREEZE) liquid 1 Container  1 Container  Oral BID BM Robbie Lis, MD   1 Container at 04/12/16 1302  . HYDROcodone-acetaminophen (NORCO/VICODIN) 5-325 MG per tablet 1-2 tablet  1-2 tablet Oral Q6H PRN Mcarthur Rossetti, MD   1 tablet at 04/12/16 1302  . latanoprost (XALATAN) 0.005 % ophthalmic solution 1 drop  1 drop Both Eyes QHS Oswald Hillock, MD   1 drop at 04/11/16 2105  . menthol-cetylpyridinium (CEPACOL) lozenge 3 mg  1 lozenge Oral PRN Mcarthur Rossetti, MD       Or  . phenol (CHLORASEPTIC) mouth spray 1 spray  1 spray Mouth/Throat PRN Mcarthur Rossetti, MD      . methocarbamol (ROBAXIN) tablet 500 mg  500 mg Oral Q6H PRN Mcarthur Rossetti, MD   500 mg at 04/12/16 1302   Or  . methocarbamol (ROBAXIN) 500 mg in dextrose 5 % 50 mL IVPB  500 mg Intravenous Q6H PRN Mcarthur Rossetti, MD      . metoCLOPramide (REGLAN) tablet 5-10 mg  5-10 mg Oral Q8H PRN Mcarthur Rossetti, MD       Or  . metoCLOPramide (REGLAN) injection  5-10 mg  5-10 mg Intravenous Q8H PRN Mcarthur Rossetti, MD      . morphine 2 MG/ML injection 0.5 mg  0.5 mg Intravenous Q2H PRN Mcarthur Rossetti, MD      . mupirocin ointment (BACTROBAN) 2 % 1 application  1 application Nasal BID Robbie Lis, MD   1 application at [id number]  . ondansetron (ZOFRAN) tablet 4 mg  4 mg Oral Q6H PRN Mcarthur Rossetti, MD       Or  . ondansetron [facility]) injection 4 mg  4 mg Intravenous Q6H PRN Mcarthur Rossetti, MD      . senna-docusate (Senokot-S) tablet 1 tablet  1 tablet Oral QHS PRN Oswald Hillock, MD      . traMADol Veatrice Bourbon) tablet 100 mg  100 mg Oral Q8H PRN Mcarthur Rossetti, MD      . zolpidem [facility]) tablet 5 mg  5 mg Oral QHS PRN Mcarthur Rossetti, MD         Discharge Medications: Please see discharge summary for a list of discharge medications.  Relevant Imaging Results:  Relevant Lab Results:   Additional Information    [name], [place] R

## 2016-04-12 NOTE — Progress Notes (Signed)
SW completed FL2 and obtained PASRR.  PASRR: [id number]  Tilda Burrow, Social Worker

## 2016-04-13 ENCOUNTER — Inpatient Hospital Stay
Admission: RE | Admit: 2016-04-13 | Discharge: 2016-05-22 | Disposition: A | Discharge status: Home / Self Care | Payer: Medicare Other | Source: Ambulatory Visit | Attending: Pulmonary Disease | Admitting: Pulmonary Disease

## 2016-04-13 DIAGNOSIS — H409 Unspecified glaucoma: Secondary | ICD-10-CM | POA: Diagnosis not present

## 2016-04-13 DIAGNOSIS — S72009A Fracture of unspecified part of neck of unspecified femur, initial encounter for closed fracture: Secondary | ICD-10-CM | POA: Diagnosis not present

## 2016-04-13 DIAGNOSIS — Z9181 History of falling: Secondary | ICD-10-CM | POA: Diagnosis not present

## 2016-04-13 DIAGNOSIS — R262 Difficulty in walking, not elsewhere classified: Secondary | ICD-10-CM | POA: Diagnosis not present

## 2016-04-13 DIAGNOSIS — N904 Leukoplakia of vulva: Secondary | ICD-10-CM | POA: Diagnosis not present

## 2016-04-13 DIAGNOSIS — Z8542 Personal history of malignant neoplasm of other parts of uterus: Secondary | ICD-10-CM | POA: Diagnosis not present

## 2016-04-13 DIAGNOSIS — Z4789 Encounter for other orthopedic aftercare: Secondary | ICD-10-CM | POA: Diagnosis not present

## 2016-04-13 DIAGNOSIS — M6281 Muscle weakness (generalized): Secondary | ICD-10-CM | POA: Diagnosis not present

## 2016-04-13 DIAGNOSIS — S72141D Displaced intertrochanteric fracture of right femur, subsequent encounter for closed fracture with routine healing: Secondary | ICD-10-CM | POA: Diagnosis not present

## 2016-04-13 DIAGNOSIS — M25551 Pain in right hip: Secondary | ICD-10-CM | POA: Diagnosis not present

## 2016-04-13 DIAGNOSIS — M84451D Pathological fracture, right femur, subsequent encounter for fracture with routine healing: Secondary | ICD-10-CM | POA: Diagnosis not present

## 2016-04-13 LAB — CBC
HCT: 27.8 % — ABNORMAL LOW (ref 36.0–46.0)
Hemoglobin: 9.3 g/dL — ABNORMAL LOW (ref 12.0–15.0)
MCH: 30.4 pg (ref 26.0–34.0)
MCHC: 33.5 g/dL (ref 30.0–36.0)
MCV: 90.8 fL (ref 78.0–100.0)
Platelets: 203 10*3/uL (ref 150–400)
RBC: 3.06 MIL/uL — ABNORMAL LOW (ref 3.87–5.11)
RDW: 12.6 % (ref 11.5–15.5)
WBC: 9.1 10*3/uL (ref 4.0–10.5)

## 2016-04-13 NOTE — Progress Notes (Signed)
SW informed patient that she will be transported to [place] around lunch time.  Tilda Burrow, Social Work

## 2016-04-13 NOTE — Progress Notes (Signed)
Patient medically stable for discharge to skilled nursing facility today. Please to refer to discharge summary completed 04/12/2016. No changes in medications or medical management since 04/12/2016.  Leisa Lenz [facility] [id number]

## 2016-04-13 NOTE — Progress Notes (Signed)
Physical Therapy Treatment Patient Details Name: PARA ASTI MRN: [id number] DOB: 06-25-1935 Today's Date: 04/13/2016    History of Present Illness 81 y.o. female now s/p Rt hip fracture following a fall at home and resulting ORIF on 04/10/16. PMH: hepatitis, uterine cancer.     PT Comments    Pt able to progress mobility today with ambulation. Making gradual progress during sessions. Recommending SNF for further rehabilitation.   Follow Up Recommendations  SNF;Supervision for mobility/OOB     Equipment Recommendations  None recommended by PT    Recommendations for Other Services       Precautions / Restrictions Precautions Precautions: Fall Restrictions Weight Bearing Restrictions: Yes RLE Weight Bearing: Weight bearing as tolerated    Mobility  Bed Mobility Overal bed mobility: Needs Assistance Bed Mobility: Supine to Sit     Supine to sit: Mod assist     General bed mobility comments: assist provided with LEs, pt using rail to assist.   Transfers Overall transfer level: Needs assistance Equipment used: Rolling walker (2 wheeled) Transfers: Sit to/from Stand Sit to Stand: +2 physical assistance;Mod assist;Min assist            Ambulation/Gait Ambulation/Gait assistance: Mod assist Ambulation Distance (Feet): 5 Feet Assistive device: Rolling walker (2 wheeled) Gait Pattern/deviations: Step-to pattern Gait velocity: decreased   General Gait Details: min assist to advance Rt LE with swing phase.    Stairs            Wheelchair Mobility    Modified Rankin (Stroke Patients Only)       Balance Overall balance assessment: Needs assistance Sitting-balance support: No upper extremity supported Sitting balance-Leahy Scale: Fair     Standing balance support: Bilateral upper extremity supported Standing balance-Leahy Scale: Poor Standing balance comment: using rw                    Cognition Arousal/Alertness: Awake/alert Behavior  During Therapy: WFL for tasks assessed/performed Overall Cognitive Status: Within Functional Limits for tasks assessed                      Exercises      General Comments        Pertinent Vitals/Pain Pain Assessment: Faces Faces Pain Scale: Hurts even more Pain Location: Rt hip Pain Descriptors / Indicators: Grimacing;Guarding Pain Intervention(s): Limited activity within patient's tolerance;Monitored during session    Home Living                      Prior Function            PT Goals (current goals can now be found in the care plan section) Acute Rehab PT Goals Patient Stated Goal: get moving PT Goal Formulation: With patient Time For Goal Achievement: 04/25/16 Potential to Achieve Goals: Good Progress towards PT goals: Progressing toward goals    Frequency  Min 3X/week    PT Plan Current plan remains appropriate    Co-evaluation             End of Session Equipment Utilized During Treatment: Gait belt Activity Tolerance: Patient limited by pain Patient left: in chair;with call bell/phone within reach;with family/visitor present     Time: 1100-1120 PT Time Calculation (min) (ACUTE ONLY): 20 min  Charges:  $Therapeutic Activity: 8-22 mins                    G Codes:      Lenna Sciara.  [place], [place], [place] Pager [phone] Office [phone]  04/13/2016, 3:48 PM

## 2016-04-13 NOTE — Progress Notes (Signed)
Pt discharge education and instructions completed. Pt discharge to [facility] SNF with PTAR to transport her to disposition. Pt IV removed; report called off to [place] a nurse at the facility. Pt hips incision dsg remains intact and unchanged; not active bleeding noted. Pt transported off unit via stretcher with belongings and daughter to the side. Delia Heady RN

## 2016-04-13 NOTE — Clinical Social Work Placement (Signed)
   CLINICAL SOCIAL WORK PLACEMENT  NOTE  Date:  04/13/2016  Patient Details  Name: Brittany Mahoney MRN: [id number] Date of Birth: 10/17/1934  Clinical Social Work is seeking post-discharge placement for this patient at the [place] level of care (*CSW will initial, date and re-position this form in  chart as items are completed):  Yes   Patient/family provided with [place] Work Department's list of facilities offering this level of care within the geographic area requested by the patient (or if unable, by the patient's family).  Yes   Patient/family informed of their freedom to choose among providers that offer the needed level of care, that participate in Medicare, Medicaid or managed care program needed by the patient, have an available bed and are willing to accept the patient.  Yes   Patient/family informed of [facility]'s ownership interest in [facility] and [facility], as well as of the fact that they are under no obligation to receive care at these facilities.  PASRR submitted to EDS on 04/12/16     PASRR number received on 04/12/16     Existing PASRR number confirmed on       FL2 transmitted to all facilities in geographic area requested by pt/family on 04/12/16     FL2 transmitted to all facilities within larger geographic area on       Patient informed that his/her managed care company has contracts with or will negotiate with certain facilities, including the following:        Yes   Patient/family informed of bed offers received.  Patient chooses bed at [facility]     Physician recommends and patient chooses bed at      Patient to be transferred to [facility] on 04/13/16.  Patient to be transferred to facility by PTAR     Patient family notified on 04/13/16 of transfer.  Name of family member notified:  Patient and daughter     PHYSICIAN       Additional Comment:     _______________________________________________ Caroline Sauger, LCSW 04/13/2016, 11:24 AM

## 2016-04-13 NOTE — Clinical Social Work Note (Signed)
Patient to be discharged to [facility]. Patient and patient's daughter updated regarding discharge. Patient to be transported via EMS. RN report number: [place], [place] Orthopedic Social Worker

## 2016-04-21 DIAGNOSIS — M84451D Pathological fracture, right femur, subsequent encounter for fracture with routine healing: Secondary | ICD-10-CM | POA: Diagnosis not present

## 2016-04-21 DIAGNOSIS — M25551 Pain in right hip: Secondary | ICD-10-CM | POA: Diagnosis not present

## 2016-04-21 NOTE — H&P (Signed)
Brittany Mahoney MRN: [id number] DOB/AGE: Jan 10, 1935 81 y.o. Primary Care Physician:Telesforo Brosnahan L, MD Admit date: 04/13/2016 Chief Complaint: Hip fracture HPI: This is a 81 year old who is a resident of skilled care facility after fracturing her right hip last month. She had surgical treatment by Dr. Ninfa Linden and has been transferred to the skilled care facility for rehabilitation. She says she still has a lot of pain and bruising. She is participating with physical therapy. She has no other new complaints.  Past Medical History:  Diagnosis Date  . Cancer ([place])    uterus  . Headache(784.0)    migraines after periods  . Hepatitis   . PMB (postmenopausal bleeding)    Past Surgical History:  Procedure Laterality Date  . ABDOMINAL HYSTERECTOMY     TAH and BSO  . DILATION AND CURETTAGE OF UTERUS    . INTRAMEDULLARY (IM) NAIL INTERTROCHANTERIC Right 04/10/2016   Procedure: INTRAMEDULLARY (IM) NAIL RIGHT HIP FRACTURE;  Surgeon: Mcarthur Rossetti, MD;  Location: [facility];  Service: Orthopedics;  Laterality: Right;        Family History  Problem Relation Age of Onset  . Stroke Maternal Grandfather   . Stroke Paternal Grandfather   . Cancer Father     lymphatic sarcoma  . Early death Sister   . Early death Son   . Diabetes Son   . Heart disease Other   . Cancer Other   . Tuberculosis Other   . Diabetes Other   . Thyroid disease Other   . Stroke Other   . Heart disease Maternal Aunt   . Heart disease Maternal Uncle     Social History:  reports that she has never smoked. She has never used smokeless tobacco. She reports that she does not drink alcohol or use drugs.   Allergies:  Allergies  Allergen Reactions  . Codeine   . Morphine And Related     loopy  . Penicillins Hives and Swelling    Has patient had a PCN reaction causing immediate rash, facial/tongue/throat swelling, SOB or lightheadedness with hypotension: Yes Has patient had a PCN reaction causing severe rash  involving mucus membranes or skin necrosis: No Has patient had a PCN reaction that required hospitalization No Has patient had a PCN reaction occurring within the last 10 years: No  If all of the above answers are "NO", then may proceed with Cephalosporin use.     Medications Prior to Admission  Medication Sig Dispense Refill  . acetaminophen (TYLENOL) 325 MG tablet Take 2 tablets (650 mg total) by mouth every 6 (six) hours as needed for mild pain (or Fever >/= 101). 30 tablet 0  . aspirin EC 325 MG EC tablet Take 1 tablet (325 mg total) by mouth daily. 30 tablet 0  . docusate sodium (COLACE) 100 MG capsule Take 1 capsule (100 mg total) by mouth 2 (two) times daily as needed for mild constipation. 10 capsule 0  . hydrocortisone (CORTIZONE-10) 1 % ointment Apply 1 application topically daily as needed for itching (for irritation associated with incontinence).    Marland Kitchen LUMIGAN 0.01 % SOLN INSTILL 1 DROP IN INTO BOTH EYES AT BEDTIME  6       [id number] from the symptoms mentioned above,there are no other symptoms referable to all systems reviewed.  Physical Exam: There were no vitals taken for this visit. She is awake and alert. She is sitting in a wheelchair. Her pupils are reactiveand throat are clear mucous membranes are moist her neck is supple.  Her chest is clear. Her heart is regular without gallop. Abdomen is soft without masses. Extremities show no edema. Central nervous system exam grossly intact   No results for input(s): WBC, NEUTROABS, HGB, HCT, MCV, PLT in the last 72 hours. No results for input(s): NA, K, CL, CO2, GLUCOSE, BUN, CREATININE, CALCIUM, MG in the last 72 hours.  Invalid input(s): PHOlablast2(ast:2,ALT:2,alkphos:2,bilitot:2,prot:2,albumin:2)@    No results found for this or any previous visit (from the past 240 hour(s)).   Dg Chest 1 View  Result Date: 04/09/2016 CLINICAL DATA:  Status post trip and fall tonight with a right hip fracture. Preoperative  examination. EXAM: CHEST 1 VIEW COMPARISON:  PA and lateral chest 06/22/2014. FINDINGS: The lungs are clear. Heart size is normal. The aorta is tortuous. No pneumothorax or pleural effusion. No focal bony abnormality. Scoliosis noted. IMPRESSION: No acute disease. Electronically Signed   By: Inge Rise M.D.   On: 04/09/2016 21:10   Dg C-arm 1-60 Min  Result Date: 04/10/2016 CLINICAL DATA:  ORIF intertrochanteric right femoral neck fracture. EXAM: Operative RIGHT FEMUR 2 VIEWS COMPARISON:  Right hip x-rays yesterday. FINDINGS: Four spot images from the C-arm fluoroscopic device, AP and lateral views of the right femur are submitted for interpretation postoperatively. ORIF of the intertrochanteric right femoral neck fracture with compression screws placed through an intramedullary nail. Alignment appears anatomic. The radiologic technologist documented 1 min 5 sec of fluoroscopy time. IMPRESSION: Anatomic alignment post ORIF of the intertrochanteric right femoral neck fracture. Electronically Signed   By: Evangeline Dakin M.D.   On: 04/10/2016 17:03   Dg Hip Unilat  With Pelvis 2-3 Views Right  Result Date: 04/09/2016 CLINICAL DATA:  Status post trip and fall tonight while taking garbage out. Right hip pain. Initial encounter. EXAM: DG HIP (WITH OR WITHOUT PELVIS) 2-3V RIGHT COMPARISON:  None. FINDINGS: The patient has an acute right intertrochanteric fracture. No other acute bony or joint abnormality is seen. Bones somewhat osteopenic. IMPRESSION: Acute right intertrochanteric fracture. Electronically Signed   By: Inge Rise M.D.   On: 04/09/2016 21:10   Dg Femur, Min 2 Views Right  Result Date: 04/10/2016 CLINICAL DATA:  ORIF intertrochanteric right femoral neck fracture. EXAM: Operative RIGHT FEMUR 2 VIEWS COMPARISON:  Right hip x-rays yesterday. FINDINGS: Four spot images from the C-arm fluoroscopic device, AP and lateral views of the right femur are submitted for interpretation  postoperatively. ORIF of the intertrochanteric right femoral neck fracture with compression screws placed through an intramedullary nail. Alignment appears anatomic. The radiologic technologist documented 1 min 5 sec of fluoroscopy time. IMPRESSION: Anatomic alignment post ORIF of the intertrochanteric right femoral neck fracture. Electronically Signed   By: Evangeline Dakin M.D.   On: 04/10/2016 17:03   Impression: She had a hip fracture and has had surgery. She is undergoing rehabilitation Active Problems:   * No active hospital problems. *     Plan: Continue rehabilitation efforts      Leilanny Fluitt L   04/21/2016, 10:58 AM

## 2016-04-23 DIAGNOSIS — S72141D Displaced intertrochanteric fracture of right femur, subsequent encounter for closed fracture with routine healing: Secondary | ICD-10-CM | POA: Diagnosis not present

## 2016-05-04 ENCOUNTER — Other Ambulatory Visit: Payer: Self-pay

## 2016-05-24 DIAGNOSIS — S72141D Displaced intertrochanteric fracture of right femur, subsequent encounter for closed fracture with routine healing: Secondary | ICD-10-CM | POA: Diagnosis not present

## 2016-05-25 DIAGNOSIS — S72141D Displaced intertrochanteric fracture of right femur, subsequent encounter for closed fracture with routine healing: Secondary | ICD-10-CM | POA: Diagnosis not present

## 2016-05-25 DIAGNOSIS — Z90710 Acquired absence of both cervix and uterus: Secondary | ICD-10-CM | POA: Diagnosis not present

## 2016-05-25 DIAGNOSIS — Z8619 Personal history of other infectious and parasitic diseases: Secondary | ICD-10-CM | POA: Diagnosis not present

## 2016-05-25 DIAGNOSIS — Z8542 Personal history of malignant neoplasm of other parts of uterus: Secondary | ICD-10-CM | POA: Diagnosis not present

## 2016-05-28 DIAGNOSIS — Z90710 Acquired absence of both cervix and uterus: Secondary | ICD-10-CM | POA: Diagnosis not present

## 2016-05-28 DIAGNOSIS — Z8542 Personal history of malignant neoplasm of other parts of uterus: Secondary | ICD-10-CM | POA: Diagnosis not present

## 2016-05-28 DIAGNOSIS — Z8619 Personal history of other infectious and parasitic diseases: Secondary | ICD-10-CM | POA: Diagnosis not present

## 2016-05-28 DIAGNOSIS — S72141D Displaced intertrochanteric fracture of right femur, subsequent encounter for closed fracture with routine healing: Secondary | ICD-10-CM | POA: Diagnosis not present

## 2016-05-30 DIAGNOSIS — Z90710 Acquired absence of both cervix and uterus: Secondary | ICD-10-CM | POA: Diagnosis not present

## 2016-05-30 DIAGNOSIS — S72141D Displaced intertrochanteric fracture of right femur, subsequent encounter for closed fracture with routine healing: Secondary | ICD-10-CM | POA: Diagnosis not present

## 2016-05-30 DIAGNOSIS — Z8542 Personal history of malignant neoplasm of other parts of uterus: Secondary | ICD-10-CM | POA: Diagnosis not present

## 2016-05-30 DIAGNOSIS — Z8619 Personal history of other infectious and parasitic diseases: Secondary | ICD-10-CM | POA: Diagnosis not present

## 2016-06-01 DIAGNOSIS — S72141D Displaced intertrochanteric fracture of right femur, subsequent encounter for closed fracture with routine healing: Secondary | ICD-10-CM | POA: Diagnosis not present

## 2016-06-01 DIAGNOSIS — Z8542 Personal history of malignant neoplasm of other parts of uterus: Secondary | ICD-10-CM | POA: Diagnosis not present

## 2016-06-01 DIAGNOSIS — Z90710 Acquired absence of both cervix and uterus: Secondary | ICD-10-CM | POA: Diagnosis not present

## 2016-06-01 DIAGNOSIS — Z8619 Personal history of other infectious and parasitic diseases: Secondary | ICD-10-CM | POA: Diagnosis not present

## 2016-06-04 DIAGNOSIS — Z8619 Personal history of other infectious and parasitic diseases: Secondary | ICD-10-CM | POA: Diagnosis not present

## 2016-06-04 DIAGNOSIS — Z8542 Personal history of malignant neoplasm of other parts of uterus: Secondary | ICD-10-CM | POA: Diagnosis not present

## 2016-06-04 DIAGNOSIS — S72141D Displaced intertrochanteric fracture of right femur, subsequent encounter for closed fracture with routine healing: Secondary | ICD-10-CM | POA: Diagnosis not present

## 2016-06-04 DIAGNOSIS — Z90710 Acquired absence of both cervix and uterus: Secondary | ICD-10-CM | POA: Diagnosis not present

## 2016-06-05 DIAGNOSIS — Z8542 Personal history of malignant neoplasm of other parts of uterus: Secondary | ICD-10-CM | POA: Diagnosis not present

## 2016-06-05 DIAGNOSIS — Z8619 Personal history of other infectious and parasitic diseases: Secondary | ICD-10-CM | POA: Diagnosis not present

## 2016-06-05 DIAGNOSIS — Z90710 Acquired absence of both cervix and uterus: Secondary | ICD-10-CM | POA: Diagnosis not present

## 2016-06-05 DIAGNOSIS — S72141D Displaced intertrochanteric fracture of right femur, subsequent encounter for closed fracture with routine healing: Secondary | ICD-10-CM | POA: Diagnosis not present

## 2016-06-08 DIAGNOSIS — Z90710 Acquired absence of both cervix and uterus: Secondary | ICD-10-CM | POA: Diagnosis not present

## 2016-06-08 DIAGNOSIS — Z8542 Personal history of malignant neoplasm of other parts of uterus: Secondary | ICD-10-CM | POA: Diagnosis not present

## 2016-06-08 DIAGNOSIS — Z8619 Personal history of other infectious and parasitic diseases: Secondary | ICD-10-CM | POA: Diagnosis not present

## 2016-06-08 DIAGNOSIS — S72141D Displaced intertrochanteric fracture of right femur, subsequent encounter for closed fracture with routine healing: Secondary | ICD-10-CM | POA: Diagnosis not present

## 2016-06-11 DIAGNOSIS — S72141D Displaced intertrochanteric fracture of right femur, subsequent encounter for closed fracture with routine healing: Secondary | ICD-10-CM | POA: Diagnosis not present

## 2016-06-11 DIAGNOSIS — Z8619 Personal history of other infectious and parasitic diseases: Secondary | ICD-10-CM | POA: Diagnosis not present

## 2016-06-11 DIAGNOSIS — Z8542 Personal history of malignant neoplasm of other parts of uterus: Secondary | ICD-10-CM | POA: Diagnosis not present

## 2016-06-11 DIAGNOSIS — Z90710 Acquired absence of both cervix and uterus: Secondary | ICD-10-CM | POA: Diagnosis not present

## 2016-06-13 ENCOUNTER — Other Ambulatory Visit (HOSPITAL_COMMUNITY): Payer: Self-pay | Admitting: Pulmonary Disease

## 2016-06-13 DIAGNOSIS — Z0000 Encounter for general adult medical examination without abnormal findings: Secondary | ICD-10-CM | POA: Diagnosis not present

## 2016-06-13 DIAGNOSIS — Z780 Asymptomatic menopausal state: Secondary | ICD-10-CM

## 2016-06-14 DIAGNOSIS — Z8542 Personal history of malignant neoplasm of other parts of uterus: Secondary | ICD-10-CM | POA: Diagnosis not present

## 2016-06-14 DIAGNOSIS — Z90710 Acquired absence of both cervix and uterus: Secondary | ICD-10-CM | POA: Diagnosis not present

## 2016-06-14 DIAGNOSIS — Z8619 Personal history of other infectious and parasitic diseases: Secondary | ICD-10-CM | POA: Diagnosis not present

## 2016-06-14 DIAGNOSIS — S72141D Displaced intertrochanteric fracture of right femur, subsequent encounter for closed fracture with routine healing: Secondary | ICD-10-CM | POA: Diagnosis not present

## 2016-06-18 ENCOUNTER — Other Ambulatory Visit (HOSPITAL_COMMUNITY): Payer: Medicare Other

## 2016-06-18 DIAGNOSIS — Z90710 Acquired absence of both cervix and uterus: Secondary | ICD-10-CM | POA: Diagnosis not present

## 2016-06-18 DIAGNOSIS — S72141D Displaced intertrochanteric fracture of right femur, subsequent encounter for closed fracture with routine healing: Secondary | ICD-10-CM | POA: Diagnosis not present

## 2016-06-18 DIAGNOSIS — Z8542 Personal history of malignant neoplasm of other parts of uterus: Secondary | ICD-10-CM | POA: Diagnosis not present

## 2016-06-18 DIAGNOSIS — Z8619 Personal history of other infectious and parasitic diseases: Secondary | ICD-10-CM | POA: Diagnosis not present

## 2016-06-20 DIAGNOSIS — Z8619 Personal history of other infectious and parasitic diseases: Secondary | ICD-10-CM | POA: Diagnosis not present

## 2016-06-20 DIAGNOSIS — Z8542 Personal history of malignant neoplasm of other parts of uterus: Secondary | ICD-10-CM | POA: Diagnosis not present

## 2016-06-20 DIAGNOSIS — S72141D Displaced intertrochanteric fracture of right femur, subsequent encounter for closed fracture with routine healing: Secondary | ICD-10-CM | POA: Diagnosis not present

## 2016-06-20 DIAGNOSIS — Z90710 Acquired absence of both cervix and uterus: Secondary | ICD-10-CM | POA: Diagnosis not present

## 2016-07-05 DIAGNOSIS — Z23 Encounter for immunization: Secondary | ICD-10-CM | POA: Diagnosis not present

## 2016-07-13 ENCOUNTER — Ambulatory Visit (HOSPITAL_COMMUNITY)
Admission: RE | Admit: 2016-07-13 | Discharge: 2016-07-13 | Disposition: A | Discharge status: Home / Self Care | Payer: Medicare Other | Source: Ambulatory Visit | Attending: Pulmonary Disease | Admitting: Pulmonary Disease

## 2016-07-13 DIAGNOSIS — Z780 Asymptomatic menopausal state: Secondary | ICD-10-CM | POA: Insufficient documentation

## 2016-07-13 DIAGNOSIS — M810 Age-related osteoporosis without current pathological fracture: Secondary | ICD-10-CM | POA: Diagnosis not present

## 2016-07-13 DIAGNOSIS — I10 Essential (primary) hypertension: Secondary | ICD-10-CM | POA: Diagnosis not present

## 2016-07-13 DIAGNOSIS — R609 Edema, unspecified: Secondary | ICD-10-CM | POA: Diagnosis not present

## 2016-07-13 DIAGNOSIS — M791 Myalgia: Secondary | ICD-10-CM | POA: Diagnosis not present

## 2016-07-13 LAB — HM DEXA SCAN

## 2016-07-23 LAB — FECAL OCCULT BLOOD, GUAIAC: Fecal Occult Blood: NEGATIVE

## 2016-07-24 DIAGNOSIS — Z1211 Encounter for screening for malignant neoplasm of colon: Secondary | ICD-10-CM | POA: Diagnosis not present

## 2016-08-15 ENCOUNTER — Emergency Department (HOSPITAL_COMMUNITY): Payer: Medicare Other

## 2016-08-15 ENCOUNTER — Emergency Department (HOSPITAL_COMMUNITY)
Admission: EM | Admit: 2016-08-15 | Discharge: 2016-08-15 | Disposition: A | Discharge status: Home / Self Care | Payer: Medicare Other | Attending: Emergency Medicine | Admitting: Emergency Medicine

## 2016-08-15 ENCOUNTER — Encounter (HOSPITAL_COMMUNITY): Payer: Self-pay | Admitting: Emergency Medicine

## 2016-08-15 DIAGNOSIS — Z79899 Other long term (current) drug therapy: Secondary | ICD-10-CM | POA: Insufficient documentation

## 2016-08-15 DIAGNOSIS — R519 Headache, unspecified: Secondary | ICD-10-CM

## 2016-08-15 DIAGNOSIS — R42 Dizziness and giddiness: Secondary | ICD-10-CM | POA: Diagnosis not present

## 2016-08-15 DIAGNOSIS — Z8541 Personal history of malignant neoplasm of cervix uteri: Secondary | ICD-10-CM | POA: Insufficient documentation

## 2016-08-15 DIAGNOSIS — R51 Headache: Secondary | ICD-10-CM | POA: Diagnosis not present

## 2016-08-15 DIAGNOSIS — H539 Unspecified visual disturbance: Secondary | ICD-10-CM | POA: Insufficient documentation

## 2016-08-15 LAB — BASIC METABOLIC PANEL
Anion gap: 7 (ref 5–15)
BUN: 15 mg/dL (ref 6–20)
CO2: 28 mmol/L (ref 22–32)
Calcium: 9.6 mg/dL (ref 8.9–10.3)
Chloride: 106 mmol/L (ref 101–111)
Creatinine, Ser: 0.68 mg/dL (ref 0.44–1.00)
GFR calc Af Amer: >60 mL/min (ref >60)
GFR calc non Af Amer: >60 mL/min (ref >60)
Glucose, Bld: 100 mg/dL — ABNORMAL HIGH (ref 65–99)
Potassium: 3.4 mmol/L — ABNORMAL LOW (ref 3.5–5.1)
Sodium: 141 mmol/L (ref 135–145)

## 2016-08-15 LAB — CBC WITH DIFFERENTIAL/PLATELET
Basophils Absolute: 0.1 10*3/uL (ref 0.0–0.1)
Basophils Relative: 1 %
Eosinophils Absolute: 0.1 10*3/uL (ref 0.0–0.7)
Eosinophils Relative: 2 %
HCT: 42.9 % (ref 36.0–46.0)
Hemoglobin: 14.5 g/dL (ref 12.0–15.0)
Lymphocytes Relative: 27 %
Lymphs Abs: 1.9 10*3/uL (ref 0.7–4.0)
MCH: 30.5 pg (ref 26.0–34.0)
MCHC: 33.8 g/dL (ref 30.0–36.0)
MCV: 90.1 fL (ref 78.0–100.0)
Monocytes Absolute: 0.4 10*3/uL (ref 0.1–1.0)
Monocytes Relative: 6 %
Neutro Abs: 4.7 10*3/uL (ref 1.7–7.7)
Neutrophils Relative %: 64 %
Platelets: 286 10*3/uL (ref 150–400)
RBC: 4.76 MIL/uL (ref 3.87–5.11)
RDW: 13.1 % (ref 11.5–15.5)
WBC: 7.2 10*3/uL (ref 4.0–10.5)

## 2016-08-15 MED ORDER — LABETALOL HCL 5 MG/ML IV SOLN
5.0000 mg | Freq: Once | INTRAVENOUS | Status: AC
  Administered 2016-08-15: 5 mg via INTRAVENOUS
  Filled 2016-08-15: qty 4

## 2016-08-15 MED ORDER — KETOROLAC TROMETHAMINE 30 MG/ML IJ SOLN
15.0000 mg | Freq: Once | INTRAMUSCULAR | Status: AC
  Administered 2016-08-15: 15 mg via INTRAVENOUS
  Filled 2016-08-15: qty 1

## 2016-08-15 MED ORDER — SODIUM CHLORIDE 0.9 % IV BOLUS (SEPSIS)
500.0000 mL | Freq: Once | INTRAVENOUS | Status: AC
  Administered 2016-08-15: 500 mL via INTRAVENOUS

## 2016-08-15 NOTE — ED Notes (Signed)
Pt returned from CT °

## 2016-08-15 NOTE — Discharge Instructions (Signed)
Please call your physician for follow-up appointment within the next couple weeks. I would also suggest that you keep a log of your blood pressures over that time and take them in with you.

## 2016-08-15 NOTE — ED Triage Notes (Signed)
Headache since last night at 8 pm and took 2 Aleve last night at 9:30pm.  BP this am was 170/90 at home.  Denies n/v/d or discomfort.  C/o of blurred vision at 0730.

## 2016-08-15 NOTE — ED Provider Notes (Signed)
Brittany Mahoney Provider Note   CSN: [id number] Arrival date & time: 08/15/16  [id number]  By signing my name below, I, Brittany Mahoney, attest that this documentation has been prepared under the direction and in the presence of Brittany Pew, MD. Electronically Signed: Rayna Mahoney, ED Scribe. 08/15/16. 10:04 AM.   History   Chief Complaint Chief Complaint  Patient presents with  . Headache    HPI HPI Comments: Brittany Mahoney is a 81 y.o. female who presents to the Emergency Department complaining of a moderate HA onset last night. Pt states she was reading her newspaper this morning and began experiencing blurry vision which has persisted and is abnormal for her when experiencing headaches. Pt relative states she has been experiencing dizziness which began this morning and checked her BP which was elevated and was 170/90 mmHg. Pt took two aleve last night w/o relief. Pt denies a h/o HTN or stroke. She ambulates with a walker at baseline. She denies any other regions of pain, weakness, numbness, decreased oral intake, CP, SOB or other associated symptoms at this time.   The history is provided by the patient and medical records. No language interpreter was used.    Past Medical History:  Diagnosis Date  . Cancer ([place])    uterus  . Headache(784.0)    migraines after periods  . Hepatitis   . PMB (postmenopausal bleeding)     Patient Active Problem List   Diagnosis Date Noted  . Closed right hip fracture ([place]) 04/09/2016  . Hip fracture ([place]) 04/09/2016  . 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  . Endometrial cancer, grade I ([place]) 03/21/2015  . Routine gynecological examination 03/15/2014  . Screening for malignant neoplasm of the cervix 03/15/2014  . Hx of cancer of endometrium 03/06/2013    Past Surgical History:  Procedure Laterality Date  . ABDOMINAL HYSTERECTOMY     TAH and BSO  . DILATION AND CURETTAGE OF UTERUS    . INTRAMEDULLARY (IM) NAIL INTERTROCHANTERIC Right 04/10/2016   Procedure: INTRAMEDULLARY (IM) NAIL RIGHT HIP FRACTURE;  Surgeon: Mcarthur Rossetti, MD;  Location: [facility];  Service: Orthopedics;  Laterality: Right;    OB History    Gravida Para Term Preterm AB Living   3 2     1      SAB TAB Ectopic Multiple Live Births   1               Home Medications    Prior to Admission medications   Medication Sig Start Date End Date Taking? Authorizing Provider  ibuprofen (ADVIL,MOTRIN) 200 MG tablet Take 400 mg by mouth at bedtime.   Yes Historical Provider, MD  LUMIGAN 0.01 % SOLN INSTILL 1 DROP IN INTO BOTH EYES AT BEDTIME 02/07/16  Yes Historical Provider, MD    Family History Family History  Problem Relation Age of Onset  . Stroke Maternal Grandfather   . Stroke Paternal Grandfather   . Cancer Father     lymphatic sarcoma  . Early death Sister   . Early death Son   . Diabetes Son   . Heart disease Other   . Cancer Other   . Tuberculosis Other   . Diabetes Other   . Thyroid disease Other   . Stroke Other   . Heart disease Maternal Aunt   . Heart disease Maternal Uncle     Social History Social History  Substance Use Topics  . Smoking status: Never Smoker  . Smokeless tobacco: Never Used  . Alcohol use No  Allergies   Codeine; Morphine and related; and Penicillins   Review of Systems Review of Systems  Constitutional: Negative for appetite change.  Eyes: Positive for visual disturbance.  Respiratory: Negative for shortness of breath.   Cardiovascular: Negative for chest pain.  Neurological: Positive for dizziness and headaches. Negative for weakness and numbness.  All other systems reviewed and are negative.  Physical Exam Updated Vital Signs BP 143/87   Pulse 73   Temp 97.8 F (36.6 C) (Oral)   Resp 16   Ht 5\' 7"  (1.702 m)   Wt 155 lb (70.3 kg)   SpO2 98%   BMI 24.28 kg/m   Physical Exam  Constitutional: She is oriented to person, place, and time. She appears well-developed and well-nourished. No  distress.  HENT:  Head: Normocephalic and atraumatic.  Eyes: EOM are normal. Pupils are equal, round, and reactive to light.  Neck: Normal range of motion.  Cardiovascular: Normal rate, regular rhythm and normal heart sounds.  Exam reveals no gallop and no friction rub.   No murmur heard. Pulmonary/Chest: Effort normal and breath sounds normal. No respiratory distress. She has no wheezes. She has no rales.  Abdominal: Soft. She exhibits no distension and no mass. There is no tenderness. There is no rebound and no guarding. No hernia.  Musculoskeletal: Normal range of motion.  Neurological: She is alert and oriented to person, place, and time.  Reflex Scores:      Bicep reflexes are 2+ on the right side and 2+ on the left side.      Patellar reflexes are 2+ on the right side and 2+ on the left side. 5/5 strength in major muscle groups of  bilateral upper and lower extremities. Sensation intact in all extremities. Motor intact in all extremities. Speech normal. No facial asymmetry. Finger to nose nml.   Skin: Skin is warm and dry.  Psychiatric: She has a normal mood and affect. Judgment normal.  Nursing note and vitals reviewed.  ED Treatments / Results  Labs (all labs ordered are listed, but only abnormal results are displayed) Labs Reviewed  BASIC METABOLIC PANEL - Abnormal; Notable for the following:       Result Value   Potassium 3.4 (*)    Glucose, Bld 100 (*)    All other components within normal limits  CBC WITH DIFFERENTIAL/PLATELET    EKG  EKG Interpretation None       Radiology Ct Head Wo Contrast  Result Date: 08/15/2016 CLINICAL DATA:  Headache since last night. Hypertension. Blurred vision. EXAM: CT HEAD WITHOUT CONTRAST TECHNIQUE: Contiguous axial images were obtained from the base of the skull through the vertex without intravenous contrast. COMPARISON:  None. FINDINGS: Brain: No evidence of acute infarction, hemorrhage, hydrocephalus, extra-axial collection or  mass lesion/mass effect. Confluent cerebral white matter low-density attributed to chronic microvascular disease. Mild generalized cerebral volume loss in keeping with age. Vascular: Atherosclerotic calcification.  No hyperdense vessel. Skull: No acute finding Sinuses/Orbits: Bilateral cataract resection. IMPRESSION: 1. No acute finding. 2. Extensive chronic microvascular disease in the cerebral white matter. Electronically Signed   By: Monte Fantasia M.D.   On: 08/15/2016 10:36    Procedures Procedures  COORDINATION OF CARE: 10:04 AM Discussed next steps with pt. Pt verbalized understanding and is agreeable with the plan.    Medications Ordered in ED Medications  sodium chloride 0.9 % bolus 500 mL (0 mLs Intravenous Stopped 08/15/16 1257)  labetalol (NORMODYNE,TRANDATE) injection 5 mg (5 mg Intravenous Given 08/15/16 1107)  ketorolac (TORADOL) 30 MG/ML injection 15 mg (15 mg Intravenous Given 08/15/16 1106)     Initial Impression / Assessment and Plan / ED Course  I have reviewed the triage vital signs and the nursing notes.  Pertinent labs & imaging results that were available during my care of the patient were reviewed by me and considered in my medical decision making (see chart for details).  Clinical Course     HA likely 2/2 HTN. Neuro intact. Both improed with labetalol and toradol. Plan to keep log of BP's over next week or so and fu w/ PCP for further management of BP. Doubt SAH or space occupying lesion at this time.  I personally performed the services described in this documentation, which was scribed in my presence. The recorded information has been reviewed and is accurate.   Final Clinical Impressions(s) / ED Diagnoses   Final diagnoses:  Nonintractable headache, unspecified chronicity pattern, unspecified headache type    New Prescriptions Discharge Medication List as of 08/15/2016  1:22 PM       Brittany Pew, MD 08/16/16 [phone]

## 2016-09-18 DIAGNOSIS — I10 Essential (primary) hypertension: Secondary | ICD-10-CM | POA: Diagnosis not present

## 2016-09-18 DIAGNOSIS — M810 Age-related osteoporosis without current pathological fracture: Secondary | ICD-10-CM | POA: Diagnosis not present

## 2016-09-18 DIAGNOSIS — Z8781 Personal history of (healed) traumatic fracture: Secondary | ICD-10-CM | POA: Diagnosis not present

## 2016-11-09 DIAGNOSIS — Z9849 Cataract extraction status, unspecified eye: Secondary | ICD-10-CM | POA: Diagnosis not present

## 2016-11-09 DIAGNOSIS — H401112 Primary open-angle glaucoma, right eye, moderate stage: Secondary | ICD-10-CM | POA: Diagnosis not present

## 2016-11-09 DIAGNOSIS — Z961 Presence of intraocular lens: Secondary | ICD-10-CM | POA: Diagnosis not present

## 2017-02-19 ENCOUNTER — Other Ambulatory Visit (HOSPITAL_COMMUNITY): Payer: Self-pay | Admitting: Pulmonary Disease

## 2017-02-19 DIAGNOSIS — Z1231 Encounter for screening mammogram for malignant neoplasm of breast: Secondary | ICD-10-CM

## 2017-03-14 DIAGNOSIS — M129 Arthropathy, unspecified: Secondary | ICD-10-CM | POA: Diagnosis not present

## 2017-03-14 DIAGNOSIS — M810 Age-related osteoporosis without current pathological fracture: Secondary | ICD-10-CM | POA: Diagnosis not present

## 2017-03-14 DIAGNOSIS — Z8781 Personal history of (healed) traumatic fracture: Secondary | ICD-10-CM | POA: Diagnosis not present

## 2017-03-14 DIAGNOSIS — R413 Other amnesia: Secondary | ICD-10-CM | POA: Diagnosis not present

## 2017-03-18 ENCOUNTER — Ambulatory Visit (HOSPITAL_COMMUNITY)
Admission: RE | Admit: 2017-03-18 | Discharge: 2017-03-18 | Disposition: A | Discharge status: Home / Self Care | Payer: Medicare Other | Source: Ambulatory Visit | Attending: Pulmonary Disease | Admitting: Pulmonary Disease

## 2017-03-18 ENCOUNTER — Ambulatory Visit (HOSPITAL_COMMUNITY): Payer: Medicare Other

## 2017-03-18 DIAGNOSIS — Z1231 Encounter for screening mammogram for malignant neoplasm of breast: Secondary | ICD-10-CM

## 2017-03-18 DIAGNOSIS — I10 Essential (primary) hypertension: Secondary | ICD-10-CM | POA: Diagnosis not present

## 2017-03-18 DIAGNOSIS — R413 Other amnesia: Secondary | ICD-10-CM | POA: Diagnosis not present

## 2017-03-18 DIAGNOSIS — M810 Age-related osteoporosis without current pathological fracture: Secondary | ICD-10-CM | POA: Diagnosis not present

## 2017-03-28 ENCOUNTER — Other Ambulatory Visit: Payer: Medicare Other | Admitting: Obstetrics and Gynecology

## 2017-04-01 ENCOUNTER — Other Ambulatory Visit (HOSPITAL_COMMUNITY)
Admission: RE | Admit: 2017-04-01 | Discharge: 2017-04-01 | Disposition: A | Discharge status: Home / Self Care | Payer: Medicare Other | Source: Ambulatory Visit | Attending: Obstetrics and Gynecology | Admitting: Obstetrics and Gynecology

## 2017-04-01 ENCOUNTER — Ambulatory Visit (INDEPENDENT_AMBULATORY_CARE_PROVIDER_SITE_OTHER): Payer: Medicare Other | Admitting: Obstetrics and Gynecology

## 2017-04-01 ENCOUNTER — Encounter: Payer: Self-pay | Admitting: Obstetrics and Gynecology

## 2017-04-01 VITALS — BP 112/64 | HR 81 | Ht 66.5 in | Wt 165.6 lb

## 2017-04-01 DIAGNOSIS — Z8542 Personal history of malignant neoplasm of other parts of uterus: Secondary | ICD-10-CM | POA: Diagnosis not present

## 2017-04-01 DIAGNOSIS — Z01419 Encounter for gynecological examination (general) (routine) without abnormal findings: Secondary | ICD-10-CM

## 2017-04-01 DIAGNOSIS — Z124 Encounter for screening for malignant neoplasm of cervix: Secondary | ICD-10-CM

## 2017-04-01 DIAGNOSIS — C541 Malignant neoplasm of endometrium: Secondary | ICD-10-CM

## 2017-04-01 MED ORDER — ESTROGENS, CONJUGATED 0.625 MG/GM VA CREA: 1.0000 | TOPICAL_CREAM | VAGINAL | 2 refills | Status: DC

## 2017-04-01 NOTE — Progress Notes (Signed)
  Assessment:  Annual Gyn Exam 2. Atrophic vaginitis,  2 normal exams s/p endometrial cancer   Plan:  1. pap smear done, next pap due 3 2. return annually or prn 3    Annual mammogram advised 4.  Prescribe Premarin vaginal cream 5. Consider discontinuation of annual pap smear, will discuss with GYN oncology  Subjective:  Brittany Mahoney is a 82 y.o. female G3P0010 who presents for annual exam. No LMP recorded. Patient has had a hysterectomy. The patient has no complaints today. Pt had a mammogram done last week. Pt had her hysterectomy done in 2007.   The following portions of the patient's history were reviewed and updated as appropriate: allergies, current medications, past family history, past medical history, past social history, past surgical history and problem list. Past Medical History:  Diagnosis Date  . Broken hip ([place])   . Cancer ([place])    uterus  . Headache(784.0)    migraines after periods  . Hepatitis   . PMB (postmenopausal bleeding)     Past Surgical History:  Procedure Laterality Date  . ABDOMINAL HYSTERECTOMY     TAH and BSO  . DILATION AND CURETTAGE OF UTERUS    . INTRAMEDULLARY (IM) NAIL INTERTROCHANTERIC Right 04/10/2016   Procedure: INTRAMEDULLARY (IM) NAIL RIGHT HIP FRACTURE;  Surgeon: Mcarthur Rossetti, MD;  Location: [facility];  Service: Orthopedics;  Laterality: Right;     Current Outpatient Prescriptions:  .  ALENDRONATE SODIUM PO, Take 70 mg by mouth every 7 (seven) days., Disp: , Rfl:  .  ibuprofen (ADVIL,MOTRIN) 200 MG tablet, Take 400 mg by mouth at bedtime., Disp: , Rfl:  .  LUMIGAN 0.01 % SOLN, INSTILL 1 DROP IN INTO BOTH EYES AT BEDTIME, Disp: , Rfl: 6  Review of Systems Constitutional: negative Gastrointestinal: negative Genitourinary: negative  Objective:  BP 112/64 (BP Location: Right Arm, Patient Position: Sitting, Cuff Size: Normal)   Pulse 81   Ht 5' 6.5" (1.689 m)   Wt 165 lb 9.6 oz (75.1 kg)   BMI 26.33 kg/m    BMI: Body  mass index is 26.33 kg/m.  General Appearance: Alert, appropriate appearance for age. No acute distress HEENT: Grossly normal Neck / Thyroid:  Cardiovascular: RRR; normal S1, S2, no murmur Lungs: CTA bilaterally Back: No CVAT Breast Exam: No masses or nodes.No dimpling, nipple retraction or discharge. Slight flat firmness on right and left breasts Gastrointestinal: Soft, non-tender, no masses or organomegaly Pelvic Exam:  External genitalia: normal general appearance Vaginal: normal mucosa without prolapse or lesions, tissues look fragile, well supported, well healed surgical cuff Cervix: absent Adnexa: normal bimanual exam Uterus: absent Pap taken Rectovaginal: guaiac negative stool obtained Lymphatic Exam: Non-palpable nodes in neck, clavicular, axillary, or inguinal regions  Skin: no rash or abnormalities Neurologic: Normal gait and speech, no tremor  Psychiatric: Alert and oriented, appropriate affect.  Urinalysis: Not done   Mallory Shirk. MD Pgr [phone] 3:38 PM    By signing my name below, I, Izna Ahmed, attest that this documentation has been prepared under the direction and in the presence of Jonnie Kind, MD. Electronically Signed: Jabier Gauss, ED Scribe. 04/01/17. 3:38 PM.  I personally performed the services described in this documentation, which was SCRIBED in my presence. The recorded information has been reviewed and considered accurate. It has been edited as necessary during review. Jonnie Kind, MD

## 2017-04-03 LAB — CYTOLOGY - PAP
Diagnosis: NEGATIVE
HPV: NOT DETECTED

## 2017-04-10 ENCOUNTER — Ambulatory Visit (HOSPITAL_COMMUNITY): Payer: Medicare Other | Attending: Pulmonary Disease | Admitting: Physical Therapy

## 2017-04-10 ENCOUNTER — Encounter (HOSPITAL_COMMUNITY): Payer: Self-pay | Admitting: Physical Therapy

## 2017-04-10 DIAGNOSIS — R262 Difficulty in walking, not elsewhere classified: Secondary | ICD-10-CM | POA: Diagnosis not present

## 2017-04-10 DIAGNOSIS — R2681 Unsteadiness on feet: Secondary | ICD-10-CM | POA: Insufficient documentation

## 2017-04-10 DIAGNOSIS — M6281 Muscle weakness (generalized): Secondary | ICD-10-CM | POA: Insufficient documentation

## 2017-04-10 NOTE — Therapy (Signed)
[place], [place], [address] Phone: [phone]   Fax:  [phone]  Physical Therapy Evaluation  Patient Details  Name: Brittany Mahoney MRN: [id number] Date of Birth: [birth year] Referring Provider: Sinda Du   Encounter Date: 04/10/2017      PT End of Session - 04/10/17 1208    Visit Number 1   Number of Visits 13   Date for PT Re-Evaluation 05/01/17   Authorization Type Medicare/BCBS    Authorization Time Period 04/10/17 to 05/22/17   Authorization - Visit Number 1   Authorization - Number of Visits 10   PT Start Time 1120   PT Stop Time 1158   PT Time Calculation (min) 38 min   Equipment Utilized During Treatment Gait belt   Activity Tolerance Patient tolerated treatment well   Behavior During Therapy [facility] for tasks assessed/performed      Past Medical History:  Diagnosis Date  . Broken hip ([place])   . Cancer ([place])    uterus  . Headache(784.0)    migraines after periods  . Hepatitis   . PMB (postmenopausal bleeding)     Past Surgical History:  Procedure Laterality Date  . ABDOMINAL HYSTERECTOMY     TAH and BSO  . DILATION AND CURETTAGE OF UTERUS    . INTRAMEDULLARY (IM) NAIL INTERTROCHANTERIC Right 04/10/2016   Procedure: INTRAMEDULLARY (IM) NAIL RIGHT HIP FRACTURE;  Surgeon: Mcarthur Rossetti, MD;  Location: [facility];  Service: Orthopedics;  Laterality: Right;    There were no vitals filed for this visit.       Subjective Assessment - 04/10/17 1113    Subjective Patient reoprts that she broke her L LE and had a nail put in exactly a year ago; her surgery cleared up fast and she states that the real problem is how she twisted her other knee when she fell, it continues to bother her. She has not had any falls recently but this is a big concern for her. SHe arrives with her cane but is very unsteady. Getting up from a chair is difficult for her, walking is difficult as well.    Pertinent History  hx R hip fracture with IM nail placement 8/17; hx uterine cancer (resolved)   Patient Stated Goals get stronger, take care of L knee, improve balance    Currently in Pain? No/denies            [facility] PT Assessment - 04/10/17 0001      Assessment   Medical Diagnosis history of hip fracture    Referring Provider Sinda Du    Onset Date/Surgical Date --  August 2017   Next MD Visit Dr. Luan Pulling end of September    Prior Therapy PT after her IM nail surgery, went to [facility], followed by [facility]PT last year. No PT recently.      Precautions   Precautions Fall     Balance Screen   Has the patient fallen in the past 6 months No   Has the patient had a decrease in activity level because of a fear of falling?  Yes   Is the patient reluctant to leave their home because of a fear of falling?  Yes     Prior Function   Level of Independence Independent;Independent with basic ADLs;Independent with gait;Independent with transfers;Requires assistive device for independence   Vocation Retired     Strength   Right Hip Flexion 3/5   Right Hip ABduction 4/5  Left Hip Flexion 3/5   Left Hip ABduction 3+/5   Right Knee Flexion 4/5   Right Knee Extension 5/5   Left Knee Flexion 4+/5   Left Knee Extension 5/5   Right Ankle Dorsiflexion 5/5   Left Ankle Dorsiflexion 5/5     Transfers   Five time sit to stand comments  62 seconds   no UEs for first 2; UEs for last 3      Ambulation/Gait   Gait Comments flexed at hips, hips laterally protrude to R mildly; B pronation/fallen arches, reduced gait speed, rigidicty of spine/hips      Standardized Balance Assessment   Standardized Balance Assessment Dynamic Gait Index     High Level Balance   High Level Balance Comments tandem stance 8-12 seconds best; TUG 31 seconds with SPC             Objective measurements completed on examination: See above findings.                  PT Education - 04/10/17 1207    Education  provided Yes   Education Details prognosis, exam findings, HEP. POC; use walker for safety/to prevent falls for now; importance of HEP compliance and regular physical activity    Person(s) Educated Patient   Methods Explanation;Demonstration;Other (comment)   Comprehension Verbalized understanding;Returned demonstration;Need further instruction          PT Short Term Goals - 04/10/17 1211      PT SHORT TERM GOAL #1   Title Patient to be able to complete 5x sit to stand in 20 seconds with UEs in order to demonstrate improved functional strength and mobility    Time 3   Period Weeks   Status New   Target Date 05/01/17     PT SHORT TERM GOAL #2   Title Patient to be able to verbalize 5/5 ways to reduce fall risk in order to demonstrate improved general safety    Time 3   Period Weeks   Status New     PT SHORT TERM GOAL #3   Title Patient to be consistent with regular progressive walking program, at least 5 days per week, in order to combat deleterious effects of chronic deconditioning and sedentary lifestyle    Time 3   Period Weeks   Status New     PT SHORT TERM GOAL #4   Title Paitent to be compliant with correct performance of appropriate HEP, to be updated PRN    Time 1   Period Weeks   Status New   Target Date 04/17/17           PT Long Term Goals - 04/10/17 1213      PT LONG TERM GOAL #1   Title Patient to demonstrate improvement of MMT by at least one grade in all tested groups in order to improve gait and balance    Time 6   Period Weeks   Status New   Target Date 05/22/17     PT LONG TERM GOAL #2   Title Patient to be able to complete TUG in 15 seconds with LRAD in order to show improved mobility and functional balance    Time 6   Period Weeks   Status New     PT LONG TERM GOAL #3   Title Patient to be able to maintain tandem stance for at least 30 seconds on solid surface in order to show improved balance skills    Time 6  Period Weeks   Status  New     PT LONG TERM GOAL #4   Title Patient to be participatory in regular exercise program, at least 3 days per week,  with L knee pain no more than 2/10 in order to maintain functional gains and prevent recurrence of condition    Time 6   Period Weeks   Status New                Plan - 04/10/17 1209    Clinical Impression Statement Patient arrives after having experienced a fall leading to a R hip fracture and IM nail placement in August of 2017; she reports that it is very difficult for her to get up and walk, and she feels very unsteady/is quite concerned about falling, her L knee was also twisted when she fell last year and it is just not the same. Examination reveals severe balance deficit, functional muscle weakness, gait deviation, and relatively high fall risk at this time. Strongly recommend skilled PT services to address functional deficits, reduce fall risk, and assist in reaching optimal level of function.    History and Personal Factors relevant to plan of care: R hip fracture with IM nail placement August 2017   Clinical Presentation Stable   Clinical Presentation due to: chronic deconditioning, sedentary lifestyle    Clinical Decision Making Low   Rehab Potential Good   Clinical Impairments Affecting Rehab Potential (+) motivated to participate; (-) sedentary lifestyle, chronic deconditioning    PT Frequency 2x / week   PT Duration 6 weeks   PT Treatment/Interventions ADLs/Self Care Home Management;DME Instruction;Gait training;Stair training;Functional mobility training;Therapeutic activities;Therapeutic exercise;Balance training;Neuromuscular re-education;Patient/family education;Orthotic Fit/Training;Manual techniques;Energy conservation;Taping   PT Next Visit Plan review initial eval/goals; functional strength and balance focus; introduce Nustep. Discuss personal seated mini-bike to use at home, F/U on walking program    PT Home Exercise Plan Eval: bridges, supine  clams with red TB, tandem stance, walking program    Consulted and Agree with Plan of Care Patient      Patient will benefit from skilled therapeutic intervention in order to improve the following deficits and impairments:  Abnormal gait, Improper body mechanics, Pain, Decreased coordination, Decreased mobility, Postural dysfunction, Decreased activity tolerance, Decreased strength, Decreased balance, Decreased safety awareness, Difficulty walking  Visit Diagnosis: Muscle weakness (generalized) - Plan: PT plan of care cert/re-cert  Unsteadiness on feet - Plan: PT plan of care cert/re-cert  Difficulty in walking, not elsewhere classified - Plan: PT plan of care cert/re-cert      G-Codes - 45/40/98 1216    Functional Assessment Tool Used (Outpatient Only) Based on skilled clinical assessment of strength, balance, gait, fall risk    Functional Limitation Mobility: Walking and moving around   Mobility: Walking and Moving Around Current Status ([id number]) At least 60 percent but less than 80 percent impaired, limited or restricted   Mobility: Walking and Moving Around Goal Status [phone]) At least 40 percent but less than 60 percent impaired, limited or restricted       Problem List Patient Active Problem List   Diagnosis Date Noted  . History of uterine cancer 04/01/2017  . Closed right hip fracture ([place]) 04/09/2016  . Hip fracture ([place]) 04/09/2016  . 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  . Endometrial cancer, grade I ([place]) 03/21/2015  . Well woman exam with routine gynecological exam 03/15/2014  . Screening for malignant neoplasm of the cervix 03/15/2014  . Hx of cancer of endometrium 03/06/2013    Cyril Mourning  Hanley Hays, DPT [phone]  [facility] [address] [place], [place], [address] Phone: [phone]   Fax:  [phone]  Name: AVRIL BUSSER MRN: [id number] Date of Birth: 04/17/1935

## 2017-04-10 NOTE — Patient Instructions (Signed)
   BRIDGING  While lying on your back, tighten your lower abdominals, squeeze your buttocks and then raise your buttocks off the floor/bed as creating a "Bridge" with your body.   Repeat 5-10 times, 2-3 times per day.    SUPINE HIP ABDUCTION - ELASTIC BAND CLAMS  Lie down on your back with your knees bent. Place an elastic band around your knees and then draw your knees apart.  Repeat 10-15 times, 2-3 times per day.     TANDEM STANCE BALANCE (DO AT KITCHEN COUNTER FOR SAFETY)  Stand and balnace in tandem stance. Hold this position for 10 seconds, then switch your feet.  Repeat 3 times each way, 2-3 times per day.    REGULAR WALKING PROGRAM  You may use a device such as a pedometer or the step counter on your smart phone to keep track of your walking.  For the next 3 days, do your regular activities and just monitor how much time you spend walking and/or how many steps you take.  On the 4th day, progress your time/distance; continue to progress moving forward.  For example:  Day 1: 2000 steps (or 3 minutes)  Day 2: 2000 steps (or 3 minutes)  Day 3: 2000 steps (or 3 minutes)  Day 4: 2500 steps (or 5 minutes)  Etc

## 2017-04-12 ENCOUNTER — Ambulatory Visit (HOSPITAL_COMMUNITY): Payer: Medicare Other

## 2017-04-12 DIAGNOSIS — R262 Difficulty in walking, not elsewhere classified: Secondary | ICD-10-CM | POA: Diagnosis not present

## 2017-04-12 DIAGNOSIS — R2681 Unsteadiness on feet: Secondary | ICD-10-CM | POA: Diagnosis not present

## 2017-04-12 DIAGNOSIS — M6281 Muscle weakness (generalized): Secondary | ICD-10-CM

## 2017-04-12 NOTE — Therapy (Signed)
[place], [place], [address] Phone: [phone]   Fax:  [phone]  Physical Therapy Treatment  Patient Details  Name: Brittany Mahoney MRN: [id number] Date of Birth: 1934/10/10 Referring Provider: Sinda Du   Encounter Date: 04/12/2017      PT End of Session - 04/12/17 0915    Visit Number 2   Number of Visits 13   Date for PT Re-Evaluation 05/01/17   Authorization Type Medicare/BCBS    Authorization Time Period 04/10/17 to 05/22/17   Authorization - Visit Number 2   Authorization - Number of Visits 10   PT Start Time 0906   PT Stop Time 0945   PT Time Calculation (min) 39 min   Equipment Utilized During Treatment Gait belt   Activity Tolerance Patient tolerated treatment well;No increased pain   Behavior During Therapy WFL for tasks assessed/performed      Past Medical History:  Diagnosis Date  . Broken hip ([place])   . Cancer ([place])    uterus  . Headache(784.0)    migraines after periods  . Hepatitis   . PMB (postmenopausal bleeding)     Past Surgical History:  Procedure Laterality Date  . ABDOMINAL HYSTERECTOMY     TAH and BSO  . DILATION AND CURETTAGE OF UTERUS    . INTRAMEDULLARY (IM) NAIL INTERTROCHANTERIC Right 04/10/2016   Procedure: INTRAMEDULLARY (IM) NAIL RIGHT HIP FRACTURE;  Surgeon: Mcarthur Rossetti, MD;  Location: [facility];  Service: Orthopedics;  Laterality: Right;    There were no vitals filed for this visit.      Subjective Assessment - 04/12/17 0908    Subjective Pt reports some soreness pelvic region following the exercises with pain scale 4/10.  Report compliance wiht HEP without questions.   Pertinent History hx R hip fracture with IM nail placement 8/17; hx uterine cancer (resolved)   Patient Stated Goals get stronger, take care of L knee, improve balance    Currently in Pain? Yes   Pain Score 4    Pain Location Pelvis   Pain Orientation --  center   Pain Descriptors  / Indicators Sore   Pain Type Acute pain   Pain Onset In the past 7 days   Pain Frequency Intermittent   Aggravating Factors  different exercises, vaccumm   Pain Relieving Factors sitting still   Effect of Pain on Daily Activities increased time to complete                         [facility] Adult PT Treatment/Exercise - 04/12/17 0001      Exercises   Exercises Knee/Hip     Knee/Hip Exercises: Aerobic   Nustep begin next session limited by time     Knee/Hip Exercises: Standing   Heel Raises 10 reps   Heel Raises Limitations toe raises 10x   SLS 3x each LE     Knee/Hip Exercises: Seated   Sit to Sand 5 reps;without UE support  elevated height 23in      Knee/Hip Exercises: Supine   Bridges 10 reps   Bridges Limitations good form cueing for control   Other Supine Knee/Hip Exercises clam 2x 10 reps cueing for stability and form                PT Education - 04/12/17 1042    Education provided Yes   Education Details Reviewed goals, assured compliance with HEP and cueing for form/technique and  copy of eval given to pt.  Review 5 safety precautions for fall prevention.  Discussed benefits with walking program and beginning minibike at home.     Person(s) Educated Patient   Methods Explanation;Demonstration;Handout   Comprehension Verbalized understanding;Returned demonstration;Verbal cues required;Tactile cues required;Need further instruction          PT Short Term Goals - 04/10/17 1211      PT SHORT TERM GOAL #1   Title Patient to be able to complete 5x sit to stand in 20 seconds with UEs in order to demonstrate improved functional strength and mobility    Time 3   Period Weeks   Status New   Target Date 05/01/17     PT SHORT TERM GOAL #2   Title Patient to be able to verbalize 5/5 ways to reduce fall risk in order to demonstrate improved general safety    Time 3   Period Weeks   Status New     PT SHORT TERM GOAL #3   Title Patient to be  consistent with regular progressive walking program, at least 5 days per week, in order to combat deleterious effects of chronic deconditioning and sedentary lifestyle    Time 3   Period Weeks   Status New     PT SHORT TERM GOAL #4   Title Paitent to be compliant with correct performance of appropriate HEP, to be updated PRN    Time 1   Period Weeks   Status New   Target Date 04/17/17           PT Long Term Goals - 04/10/17 1213      PT LONG TERM GOAL #1   Title Patient to demonstrate improvement of MMT by at least one grade in all tested groups in order to improve gait and balance    Time 6   Period Weeks   Status New   Target Date 05/22/17     PT LONG TERM GOAL #2   Title Patient to be able to complete TUG in 15 seconds with LRAD in order to show improved mobility and functional balance    Time 6   Period Weeks   Status New     PT LONG TERM GOAL #3   Title Patient to be able to maintain tandem stance for at least 30 seconds on solid surface in order to show improved balance skills    Time 6   Period Weeks   Status New     PT LONG TERM GOAL #4   Title Patient to be participatory in regular exercise program, at least 3 days per week,  with L knee pain no more than 2/10 in order to maintain functional gains and prevent recurrence of condition    Time 6   Period Weeks   Status New               Plan - 04/12/17 3267    Clinical Impression Statement Reviewed goals, assured compliance iwht HEP and copy of eval given to pt.  Revewied HEP form wiht cueing to improve stability and reduce compensaiton.  Discussion held with benefits of beginning walking program as well as riding her minibike for activity tolerance and strengthening.  Pt educated on safety precautions to reduce risk of fall/injury, pt verbalized understanding.  Pt complete all therex wtih therapist facilitation for form and technique.  No reprots of pain through session, was limited by fatigue with tasks.      Rehab Potential Good  Clinical Impairments Affecting Rehab Potential (+) motivated to participate; (-) sedentary lifestyle, chronic deconditioning    PT Frequency 2x / week   PT Duration 6 weeks   PT Treatment/Interventions ADLs/Self Care Home Management;DME Instruction;Gait training;Stair training;Functional mobility training;Therapeutic activities;Therapeutic exercise;Balance training;Neuromuscular re-education;Patient/family education;Orthotic Fit/Training;Manual techniques;Energy conservation;Taping   PT Next Visit Plan Next session begin Nustep and progress to [facility] functional strength and balance focus;  Assess compliance with HEP, beginning bike and walking program.     PT Home Exercise Plan Eval: bridges, supine clams with red TB, tandem stance, walking program       Patient will benefit from skilled therapeutic intervention in order to improve the following deficits and impairments:  Abnormal gait, Improper body mechanics, Pain, Decreased coordination, Decreased mobility, Postural dysfunction, Decreased activity tolerance, Decreased strength, Decreased balance, Decreased safety awareness, Difficulty walking  Visit Diagnosis: Muscle weakness (generalized)  Unsteadiness on feet  Difficulty in walking, not elsewhere classified     Problem List Patient Active Problem List   Diagnosis Date Noted  . History of uterine cancer 04/01/2017  . Closed right hip fracture ([place]) 04/09/2016  . Hip fracture ([place]) 04/09/2016  . 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  . Endometrial cancer, grade I ([place]) 03/21/2015  . Well woman exam with routine gynecological exam 03/15/2014  . Screening for malignant neoplasm of the cervix 03/15/2014  . Hx of cancer of endometrium 03/06/2013   Ihor Austin, LPTA; [place]  Aldona Lento 04/12/2017, 10:46 AM  [place], [place], [address] Phone: [phone]   Fax:   [phone]  Name: Brittany Mahoney MRN: [id number] Date of Birth: Aug 24, 1935

## 2017-04-16 ENCOUNTER — Ambulatory Visit (HOSPITAL_COMMUNITY): Payer: Medicare Other | Admitting: Physical Therapy

## 2017-04-16 DIAGNOSIS — M6281 Muscle weakness (generalized): Secondary | ICD-10-CM

## 2017-04-16 DIAGNOSIS — R262 Difficulty in walking, not elsewhere classified: Secondary | ICD-10-CM

## 2017-04-16 DIAGNOSIS — R2681 Unsteadiness on feet: Secondary | ICD-10-CM

## 2017-04-16 NOTE — Therapy (Signed)
[place], [place], [address] Phone: [phone]   Fax:  [phone]  Physical Therapy Treatment  Patient Details  Name: Brittany Mahoney MRN: [id number] Date of Birth: 07/06/1935 Referring Provider: Sinda Du   Encounter Date: 04/16/2017      PT End of Session - 04/16/17 0952    Visit Number 3   Number of Visits 13   Date for PT Re-Evaluation 05/01/17   Authorization Type Medicare/BCBS    Authorization Time Period 04/10/17 to 05/22/17   Authorization - Visit Number 3   Authorization - Number of Visits 10   PT Start Time 0906   PT Stop Time 0944   PT Time Calculation (min) 38 min   Activity Tolerance Patient tolerated treatment well   Behavior During Therapy [facility] for tasks assessed/performed      Past Medical History:  Diagnosis Date  . Broken hip ([place])   . Cancer ([place])    uterus  . Headache(784.0)    migraines after periods  . Hepatitis   . PMB (postmenopausal bleeding)     Past Surgical History:  Procedure Laterality Date  . ABDOMINAL HYSTERECTOMY     TAH and BSO  . DILATION AND CURETTAGE OF UTERUS    . INTRAMEDULLARY (IM) NAIL INTERTROCHANTERIC Right 04/10/2016   Procedure: INTRAMEDULLARY (IM) NAIL RIGHT HIP FRACTURE;  Surgeon: Mcarthur Rossetti, MD;  Location: [place];  Service: Orthopedics;  Laterality: Right;    There were no vitals filed for this visit.      Subjective Assessment - 04/16/17 0909    Subjective Patient arrives stating she is doing well, she has started riding her mini bike at home. She arrives with her cane and reports that her walker is very hard for her to get in/out of the car so she just uses her cane when she goes out.    Pertinent History hx R hip fracture with IM nail placement 8/17; hx uterine cancer (resolved)   Patient Stated Goals get stronger, take care of L knee, improve balance    Currently in Pain? No/denies                         [facility]  Adult PT Treatment/Exercise - 04/16/17 0001      Knee/Hip Exercises: Standing   Heel Raises Both;1 set;15 reps   Heel Raises Limitations heel and toe    Forward Lunges Both;1 set;10 reps   Forward Lunges Limitations 6 inch box B HHA    Forward Step Up Both;1 set;10 reps   Forward Step Up Limitations 4 inch box B HHA      Knee/Hip Exercises: Seated   Sit to Sand 2 sets;with UE support;5 reps;Other (comment)  LEs staggered      Knee/Hip Exercises: Supine   Bridges 15 reps   Straight Leg Raises Both;1 set;10 reps   Straight Leg Raises Limitations cues for eccentric lower      Knee/Hip Exercises: Sidelying   Hip ABduction Both;1 set;10 reps   Hip ABduction Limitations cues and mod assist for form      Knee/Hip Exercises: Prone   Hip Extension Both;1 set;10 reps   Hip Extension Limitations knee bent                 PT Education - 04/16/17 0952    Education provided Yes   Education Details possible soreness after today's session    Person(s) Educated Patient  Methods Explanation   Comprehension Verbalized understanding          PT Short Term Goals - 04/10/17 1211      PT SHORT TERM GOAL #1   Title Patient to be able to complete 5x sit to stand in 20 seconds with UEs in order to demonstrate improved functional strength and mobility    Time 3   Period Weeks   Status New   Target Date 05/01/17     PT SHORT TERM GOAL #2   Title Patient to be able to verbalize 5/5 ways to reduce fall risk in order to demonstrate improved general safety    Time 3   Period Weeks   Status New     PT SHORT TERM GOAL #3   Title Patient to be consistent with regular progressive walking program, at least 5 days per week, in order to combat deleterious effects of chronic deconditioning and sedentary lifestyle    Time 3   Period Weeks   Status New     PT SHORT TERM GOAL #4   Title Paitent to be compliant with correct performance of appropriate HEP, to be updated PRN    Time 1    Period Weeks   Status New   Target Date 04/17/17           PT Long Term Goals - 04/10/17 1213      PT LONG TERM GOAL #1   Title Patient to demonstrate improvement of MMT by at least one grade in all tested groups in order to improve gait and balance    Time 6   Period Weeks   Status New   Target Date 05/22/17     PT LONG TERM GOAL #2   Title Patient to be able to complete TUG in 15 seconds with LRAD in order to show improved mobility and functional balance    Time 6   Period Weeks   Status New     PT LONG TERM GOAL #3   Title Patient to be able to maintain tandem stance for at least 30 seconds on solid surface in order to show improved balance skills    Time 6   Period Weeks   Status New     PT LONG TERM GOAL #4   Title Patient to be participatory in regular exercise program, at least 3 days per week,  with L knee pain no more than 2/10 in order to maintain functional gains and prevent recurrence of condition    Time 6   Period Weeks   Status New               Plan - 04/16/17 4401    Clinical Impression Statement Continued functional strengthening and balance training this session, providing cues for form and progressing all exercises/activities as able this session. Patient has been compliant with HEP, including use of minibike, thus far and appears to remain motivated to continue with skilled PT services. Fatigue noted during session.    Rehab Potential Good   Clinical Impairments Affecting Rehab Potential (+) motivated to participate; (-) sedentary lifestyle, chronic deconditioning    PT Frequency 2x / week   PT Duration 6 weeks   PT Treatment/Interventions ADLs/Self Care Home Management;DME Instruction;Gait training;Stair training;Functional mobility training;Therapeutic activities;Therapeutic exercise;Balance training;Neuromuscular re-education;Patient/family education;Orthotic Fit/Training;Manual techniques;Energy conservation;Taping   PT Next Visit Plan  continue progressing strength as able. Increase balance work    PT Home Exercise Plan Eval: bridges, supine clams with red TB, tandem  stance, walking program    Consulted and Agree with Plan of Care Patient      Patient will benefit from skilled therapeutic intervention in order to improve the following deficits and impairments:  Abnormal gait, Improper body mechanics, Pain, Decreased coordination, Decreased mobility, Postural dysfunction, Decreased activity tolerance, Decreased strength, Decreased balance, Decreased safety awareness, Difficulty walking  Visit Diagnosis: Muscle weakness (generalized)  Unsteadiness on feet  Difficulty in walking, not elsewhere classified     Problem List Patient Active Problem List   Diagnosis Date Noted  . History of uterine cancer 04/01/2017  . Closed right hip fracture ([place]) 04/09/2016  . Hip fracture ([place]) 04/09/2016  . 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  . Endometrial cancer, grade I ([place]) 03/21/2015  . Well woman exam with routine gynecological exam 03/15/2014  . Screening for malignant neoplasm of the cervix 03/15/2014  . Hx of cancer of endometrium 03/06/2013    Deniece Ree PT, DPT [place] [address] [place], [place], [address] Phone: [phone]   Fax:  [phone]  Name: Brittany Mahoney MRN: [id number] Date of Birth: May 06, 1935

## 2017-04-18 ENCOUNTER — Ambulatory Visit (HOSPITAL_COMMUNITY): Payer: Medicare Other

## 2017-04-18 DIAGNOSIS — R262 Difficulty in walking, not elsewhere classified: Secondary | ICD-10-CM

## 2017-04-18 DIAGNOSIS — M6281 Muscle weakness (generalized): Secondary | ICD-10-CM | POA: Diagnosis not present

## 2017-04-18 DIAGNOSIS — R2681 Unsteadiness on feet: Secondary | ICD-10-CM

## 2017-04-18 NOTE — Therapy (Signed)
[place], [place], [address] Phone: [phone]   Fax:  [phone]  Physical Therapy Treatment  Patient Details  Name: Brittany Mahoney MRN: [id number] Date of Birth: 08-01-1935 Referring Provider: Sinda Du  Encounter Date: 04/18/2017      PT End of Session - 04/18/17 0911    Visit Number 4   Number of Visits 13   Date for PT Re-Evaluation 05/01/17   Authorization Type Medicare/BCBS    Authorization Time Period 04/10/17 to 05/22/17   Authorization - Visit Number 4   Authorization - Number of Visits 10   PT Start Time 0907   PT Stop Time 0946   PT Time Calculation (min) 39 min   Equipment Utilized During Treatment Gait belt   Activity Tolerance Patient tolerated treatment well   Behavior During Therapy [facility] for tasks assessed/performed      Past Medical History:  Diagnosis Date  . Broken hip ([place])   . Cancer ([place])    uterus  . Headache(784.0)    migraines after periods  . Hepatitis   . PMB (postmenopausal bleeding)     Past Surgical History:  Procedure Laterality Date  . ABDOMINAL HYSTERECTOMY     TAH and BSO  . DILATION AND CURETTAGE OF UTERUS    . INTRAMEDULLARY (IM) NAIL INTERTROCHANTERIC Right 04/10/2016   Procedure: INTRAMEDULLARY (IM) NAIL RIGHT HIP FRACTURE;  Surgeon: Mcarthur Rossetti, MD;  Location: [place];  Service: Orthopedics;  Laterality: Right;    There were no vitals filed for this visit.      Subjective Assessment - 04/18/17 0910    Subjective Pt reports she is doing well today, no reports of pain or recent falls.  Reports balance most concern currently   Pertinent History hx R hip fracture with IM nail placement 8/17; hx uterine cancer (resolved)   Patient Stated Goals get stronger, take care of L knee, improve balance    Currently in Pain? No/denies            [facility] PT Assessment - 04/18/17 0001      Assessment   Medical Diagnosis history of hip fracture    Referring Provider Sinda Du   Onset Date/Surgical Date --  August 2017   Next MD Visit Dr. Luan Pulling October 2018   Prior Therapy PT after her IM nail surgery, went to [facility], followed by [facility]PT last year. No PT recently.      Precautions   Precautions Fall                     [facility] Adult PT Treatment/Exercise - 04/18/17 0001      Knee/Hip Exercises: Standing   Heel Raises Both;1 set;15 reps   Heel Raises Limitations heel and toe    Forward Lunges Both;1 set;10 reps   Forward Lunges Limitations 6 inch box B HHA    Forward Step Up Right;15 reps;Hand Hold: 2;Step Height: 4"   Forward Step Up Limitations 4 inch box B HHA    SLS 3x each LE   Other Standing Knee Exercises tandem stance 3x 30"   Other Standing Knee Exercises sidestep RTB 2RT in // bars     Knee/Hip Exercises: Seated   Sit to Sand 10 reps;without UE support  noted crepitus BLE knee                  PT Short Term Goals - 04/10/17 1211  PT SHORT TERM GOAL #1   Title Patient to be able to complete 5x sit to stand in 20 seconds with UEs in order to demonstrate improved functional strength and mobility    Time 3   Period Weeks   Status New   Target Date 05/01/17     PT SHORT TERM GOAL #2   Title Patient to be able to verbalize 5/5 ways to reduce fall risk in order to demonstrate improved general safety    Time 3   Period Weeks   Status New     PT SHORT TERM GOAL #3   Title Patient to be consistent with regular progressive walking program, at least 5 days per week, in order to combat deleterious effects of chronic deconditioning and sedentary lifestyle    Time 3   Period Weeks   Status New     PT SHORT TERM GOAL #4   Title Paitent to be compliant with correct performance of appropriate HEP, to be updated PRN    Time 1   Period Weeks   Status New   Target Date 04/17/17           PT Long Term Goals - 04/10/17 1213      PT LONG TERM GOAL #1   Title Patient to  demonstrate improvement of MMT by at least one grade in all tested groups in order to improve gait and balance    Time 6   Period Weeks   Status New   Target Date 05/22/17     PT LONG TERM GOAL #2   Title Patient to be able to complete TUG in 15 seconds with LRAD in order to show improved mobility and functional balance    Time 6   Period Weeks   Status New     PT LONG TERM GOAL #3   Title Patient to be able to maintain tandem stance for at least 30 seconds on solid surface in order to show improved balance skills    Time 6   Period Weeks   Status New     PT LONG TERM GOAL #4   Title Patient to be participatory in regular exercise program, at least 3 days per week,  with L knee pain no more than 2/10 in order to maintain functional gains and prevent recurrence of condition    Time 6   Period Weeks   Status New               Plan - 04/18/17 1601    Clinical Impression Statement Increased focus with CKC for functional strengthening and additional balance activities.  Pt able to demonstrate appropriate technique with all therex following demonstration and verbal cueing.  Pt c/o stiffness in knees with lunges and reports of increased pain with task.  EOS pt was limited by fatigue with increased CKC this session.     Rehab Potential Good   Clinical Impairments Affecting Rehab Potential (+) motivated to participate; (-) sedentary lifestyle, chronic deconditioning    PT Frequency 2x / week   PT Duration 6 weeks   PT Treatment/Interventions ADLs/Self Care Home Management;DME Instruction;Gait training;Stair training;Functional mobility training;Therapeutic activities;Therapeutic exercise;Balance training;Neuromuscular re-education;Patient/family education;Orthotic Fit/Training;Manual techniques;Energy conservation;Taping   PT Next Visit Plan continue progressing strength as able. Increase balance work    PT Home Exercise Plan Eval: bridges, supine clams with red TB, tandem stance,  walking program       Patient will benefit from skilled therapeutic intervention in order to improve the  following deficits and impairments:  Abnormal gait, Improper body mechanics, Pain, Decreased coordination, Decreased mobility, Postural dysfunction, Decreased activity tolerance, Decreased strength, Decreased balance, Decreased safety awareness, Difficulty walking  Visit Diagnosis: Muscle weakness (generalized)  Unsteadiness on feet  Difficulty in walking, not elsewhere classified     Problem List Patient Active Problem List   Diagnosis Date Noted  . History of uterine cancer 04/01/2017  . Closed right hip fracture ([place]) 04/09/2016  . Hip fracture ([place]) 04/09/2016  . 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  . Endometrial cancer, grade I ([place]) 03/21/2015  . Well woman exam with routine gynecological exam 03/15/2014  . Screening for malignant neoplasm of the cervix 03/15/2014  . Hx of cancer of endometrium 03/06/2013   Ihor [place], [place]; [place]  Aldona Lento 04/18/2017, 2:36 PM  [place], [place], [address] Phone: [phone]   Fax:  [phone]  Name: Brittany Mahoney MRN: [id number] Date of Birth: Feb 28, 1935

## 2017-04-23 ENCOUNTER — Ambulatory Visit (HOSPITAL_COMMUNITY): Payer: Medicare Other | Admitting: Physical Therapy

## 2017-04-23 DIAGNOSIS — R2681 Unsteadiness on feet: Secondary | ICD-10-CM

## 2017-04-23 DIAGNOSIS — R262 Difficulty in walking, not elsewhere classified: Secondary | ICD-10-CM | POA: Diagnosis not present

## 2017-04-23 DIAGNOSIS — M6281 Muscle weakness (generalized): Secondary | ICD-10-CM

## 2017-04-23 NOTE — Therapy (Signed)
[place], [place], [address] Phone: [phone]   Fax:  [phone]  Physical Therapy Treatment  Patient Details  Name: Brittany Mahoney MRN: [id number] Date of Birth: [birth year] Referring Provider: Sinda Du  Encounter Date: 04/23/2017      PT End of Session - 04/23/17 0956    Visit Number 5   Number of Visits 13   Date for PT Re-Evaluation 05/01/17   Authorization Type Medicare/BCBS    Authorization Time Period 04/10/17 to 05/22/17   Authorization - Visit Number 5   Authorization - Number of Visits 10   PT Start Time 0902   PT Stop Time 0943   PT Time Calculation (min) 41 min   Equipment Utilized During Treatment Gait belt   Activity Tolerance Patient tolerated treatment well   Behavior During Therapy [facility] for tasks assessed/performed      Past Medical History:  Diagnosis Date  . Broken hip ([place])   . Cancer ([place])    uterus  . Headache(784.0)    migraines after periods  . Hepatitis   . PMB (postmenopausal bleeding)     Past Surgical History:  Procedure Laterality Date  . ABDOMINAL HYSTERECTOMY     TAH and BSO  . DILATION AND CURETTAGE OF UTERUS    . INTRAMEDULLARY (IM) NAIL INTERTROCHANTERIC Right 04/10/2016   Procedure: INTRAMEDULLARY (IM) NAIL RIGHT HIP FRACTURE;  Surgeon: Mcarthur Rossetti, MD;  Location: [facility];  Service: Orthopedics;  Laterality: Right;    There were no vitals filed for this visit.      Subjective Assessment - 04/23/17 0906    Subjective Patient arrives stating she is doing well, no falls or LOB recently, no major changes and she has been working on walking    Pertinent History hx R hip fracture with IM nail placement 8/17; hx uterine cancer (resolved)   Patient Stated Goals get stronger, take care of L knee, improve balance    Currently in Pain? Yes   Pain Score 3    Pain Location Knee   Pain Orientation Left   Pain Descriptors / Indicators  Cramping;Squeezing;Tightness   Pain Type Chronic pain   Pain Radiating Towards none    Pain Onset In the past 7 days   Pain Frequency Intermittent   Aggravating Factors  various exercises    Pain Relieving Factors resting    Effect of Pain on Daily Activities makes herself push through                          [facility] Adult PT Treatment/Exercise - 04/23/17 0001      Knee/Hip Exercises: Standing   Heel Raises Both;1 set;15 reps   Heel Raises Limitations heel and toe    Forward Lunges Both;1 set;15 reps   Forward Lunges Limitations 6 inch box    Hip Abduction Both;1 set;10 reps;Knee straight   Abduction Limitations 3 second holds      Knee/Hip Exercises: Seated   Sit to Sand with UE support  x2 reps limited by knee pain      Knee/Hip Exercises: Supine   Bridges 20 reps   Bridges Limitations 3 second holds      Knee/Hip Exercises: Prone   Hip Extension Both;1 set;10 reps   Hip Extension Limitations knee bent              Balance Exercises - 04/23/17 0939      Balance Exercises:  Standing   Tandem Gait Forward;2 reps;Other (comment)  full reps in parallel bars    Retro Gait 2 reps;Other (comment)  full reps in parallel bars    Step Over Hurdles / [place] 6 inch hurdles no HHA cues to reduce circumduction and compenstaions            PT Education - 04/23/17 0956    Education provided No          PT Short Term Goals - 04/10/17 1211      PT SHORT TERM GOAL #1   Title Patient to be able to complete 5x sit to stand in 20 seconds with UEs in order to demonstrate improved functional strength and mobility    Time 3   Period Weeks   Status New   Target Date 05/01/17     PT SHORT TERM GOAL #2   Title Patient to be able to verbalize 5/5 ways to reduce fall risk in order to demonstrate improved general safety    Time 3   Period Weeks   Status New     PT SHORT TERM GOAL #3   Title Patient to be consistent with regular progressive walking program,  at least 5 days per week, in order to combat deleterious effects of chronic deconditioning and sedentary lifestyle    Time 3   Period Weeks   Status New     PT SHORT TERM GOAL #4   Title Paitent to be compliant with correct performance of appropriate HEP, to be updated PRN    Time 1   Period Weeks   Status New   Target Date 04/17/17           PT Long Term Goals - 04/10/17 1213      PT LONG TERM GOAL #1   Title Patient to demonstrate improvement of MMT by at least one grade in all tested groups in order to improve gait and balance    Time 6   Period Weeks   Status New   Target Date 05/22/17     PT LONG TERM GOAL #2   Title Patient to be able to complete TUG in 15 seconds with LRAD in order to show improved mobility and functional balance    Time 6   Period Weeks   Status New     PT LONG TERM GOAL #3   Title Patient to be able to maintain tandem stance for at least 30 seconds on solid surface in order to show improved balance skills    Time 6   Period Weeks   Status New     PT LONG TERM GOAL #4   Title Patient to be participatory in regular exercise program, at least 3 days per week,  with L knee pain no more than 2/10 in order to maintain functional gains and prevent recurrence of condition    Time 6   Period Weeks   Status New               Plan - 04/23/17 0957    Clinical Impression Statement Continued with progression of functional strengthening and balance; patient reports knee pain limiting participation in some activities today, all exercises modified PRN due to this factor this session. Patient appears to be improving with skilled PT services however also continues to demonstrate significant functional weakness and unsteadiness for which she will continue to benefit from skilled intervention moving forward.    Rehab Potential Good   Clinical Impairments Affecting Rehab Potential (+)  motivated to participate; (-) sedentary lifestyle, chronic deconditioning     PT Frequency 2x / week   PT Duration 6 weeks   PT Treatment/Interventions ADLs/Self Care Home Management;DME Instruction;Gait training;Stair training;Functional mobility training;Therapeutic activities;Therapeutic exercise;Balance training;Neuromuscular re-education;Patient/family education;Orthotic Fit/Training;Manual techniques;Energy conservation;Taping   PT Next Visit Plan continue working strength and balance but adjust PRN due to knee pain; continue to progress balance work    PT Home Exercise Plan Eval: bridges, supine clams with red TB, tandem stance, walking program    Consulted and Agree with Plan of Care Patient      Patient will benefit from skilled therapeutic intervention in order to improve the following deficits and impairments:  Abnormal gait, Improper body mechanics, Pain, Decreased coordination, Decreased mobility, Postural dysfunction, Decreased activity tolerance, Decreased strength, Decreased balance, Decreased safety awareness, Difficulty walking  Visit Diagnosis: Muscle weakness (generalized)  Unsteadiness on feet  Difficulty in walking, not elsewhere classified     Problem List Patient Active Problem List   Diagnosis Date Noted  . History of uterine cancer 04/01/2017  . Closed right hip fracture ([place]) 04/09/2016  . Hip fracture ([place]) 04/09/2016  . 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  . Endometrial cancer, grade I ([place]) 03/21/2015  . Well woman exam with routine gynecological exam 03/15/2014  . Screening for malignant neoplasm of the cervix 03/15/2014  . Hx of cancer of endometrium 03/06/2013    Deniece Ree PT, DPT [place] [address] [place], [place], [address] Phone: [phone]   Fax:  [phone]  Name: Brittany Mahoney MRN: [id number] Date of Birth: August 25, 1935

## 2017-04-25 ENCOUNTER — Ambulatory Visit (HOSPITAL_COMMUNITY): Payer: Medicare Other

## 2017-04-25 DIAGNOSIS — M6281 Muscle weakness (generalized): Secondary | ICD-10-CM

## 2017-04-25 DIAGNOSIS — R262 Difficulty in walking, not elsewhere classified: Secondary | ICD-10-CM

## 2017-04-25 DIAGNOSIS — R2681 Unsteadiness on feet: Secondary | ICD-10-CM

## 2017-04-25 NOTE — Therapy (Signed)
Brittany Mahoney, [place], [address] Phone: [phone]   Fax:  [phone]  Physical Therapy Treatment  Patient Details  Name: Brittany Mahoney MRN: [id number] Date of Birth: 09-08-1935 Referring Provider: Sinda Du  Encounter Date: 04/25/2017      PT End of Session - 04/25/17 0907    Visit Number 6   Number of Visits 13   Date for PT Re-Evaluation 05/01/17   Authorization Type Medicare/BCBS    Authorization Time Period 04/10/17 to 05/22/17   Authorization - Visit Number 6   Authorization - Number of Visits 10   PT Start Time 0902   PT Stop Time 0947   PT Time Calculation (min) 45 min   Equipment Utilized During Treatment Gait belt   Activity Tolerance Patient tolerated treatment well   Behavior During Therapy [facility] for tasks assessed/performed      Past Medical History:  Diagnosis Date  . Broken hip ([place])   . Cancer ([place])    uterus  . Headache(784.0)    migraines after periods  . Hepatitis   . PMB (postmenopausal bleeding)     Past Surgical History:  Procedure Laterality Date  . ABDOMINAL HYSTERECTOMY     TAH and BSO  . DILATION AND CURETTAGE OF UTERUS    . INTRAMEDULLARY (IM) NAIL INTERTROCHANTERIC Right 04/10/2016   Procedure: INTRAMEDULLARY (IM) NAIL RIGHT HIP FRACTURE;  Surgeon: Mcarthur Rossetti, MD;  Location: [place];  Service: Orthopedics;  Laterality: Right;    There were no vitals filed for this visit.      Subjective Assessment - 04/25/17 0904    Subjective Pt stated she is doing good today, no reports of pain or recent LOB.  Reports compliance with HEP daily and walking program   Pertinent History hx R hip fracture with IM nail placement 8/17; hx uterine cancer (resolved)   Patient Stated Goals get stronger, take care of L knee, improve balance    Currently in Pain? No/denies                         [facility] Adult PT Treatment/Exercise - 04/25/17 0001      Knee/Hip  Exercises: Standing   Heel Raises 20 reps   Heel Raises Limitations heel and toe    Other Standing Knee Exercises sidestep RTB 3RT  no HHA     Knee/Hip Exercises: Seated   Sit to Sand without UE support  8x; elevated height no HHA, slight increased Bil knee R>Lt     Knee/Hip Exercises: Supine   Bridges 20 reps   Bridges Limitations 3 second holds      Knee/Hip Exercises: Sidelying   Hip ABduction Right;15 reps   Hip ABduction Limitations cueing for form, able to demonstrate with proper form following cueing     Knee/Hip Exercises: Prone   Hip Extension Both;1 set;10 reps   Hip Extension Limitations SLR with manual assistance to improve form and reduce ER             Balance Exercises - 04/25/17 0939      Balance Exercises: Standing   Tandem Gait Forward;2 reps   Retro Gait 2 reps   Step Over Hurdles / Cones 6 inch hurdles no HHA cues to reduce circumduction and compenstaions              PT Short Term Goals - 04/10/17 1211      PT SHORT TERM GOAL #  1   Title Patient to be able to complete 5x sit to stand in 20 seconds with UEs in order to demonstrate improved functional strength and mobility    Time 3   Period Weeks   Status New   Target Date 05/01/17     PT SHORT TERM GOAL #2   Title Patient to be able to verbalize 5/5 ways to reduce fall risk in order to demonstrate improved general safety    Time 3   Period Weeks   Status New     PT SHORT TERM GOAL #3   Title Patient to be consistent with regular progressive walking program, at least 5 days per week, in order to combat deleterious effects of chronic deconditioning and sedentary lifestyle    Time 3   Period Weeks   Status New     PT SHORT TERM GOAL #4   Title Paitent to be compliant with correct performance of appropriate HEP, to be updated PRN    Time 1   Period Weeks   Status New   Target Date 04/17/17           PT Long Term Goals - 04/10/17 1213      PT LONG TERM GOAL #1   Title  Patient to demonstrate improvement of MMT by at least one grade in all tested groups in order to improve gait and balance    Time 6   Period Weeks   Status New   Target Date 05/22/17     PT LONG TERM GOAL #2   Title Patient to be able to complete TUG in 15 seconds with LRAD in order to show improved mobility and functional balance    Time 6   Period Weeks   Status New     PT LONG TERM GOAL #3   Title Patient to be able to maintain tandem stance for at least 30 seconds on solid surface in order to show improved balance skills    Time 6   Period Weeks   Status New     PT LONG TERM GOAL #4   Title Patient to be participatory in regular exercise program, at least 3 days per week,  with L knee pain no more than 2/10 in order to maintain functional gains and prevent recurrence of condition    Time 6   Period Weeks   Status New               Plan - 04/25/17 4235    Clinical Impression Statement Continued session focus with proximal/functional strengthening and balance training.  Progressed balance activities with less HHA with min assist for safety.  Elevated height with sit to stand with ability to complete without HHA, does report slight increase Bil knees Rt>Lt with movement, no reoprts of increased knee pain at EOS.  Pt continues to demonstrate gluteal weakness noted wiht gait and functional tasks.     Rehab Potential Good   Clinical Impairments Affecting Rehab Potential (+) motivated to participate; (-) sedentary lifestyle, chronic deconditioning    PT Frequency 2x / week   PT Duration 6 weeks   PT Treatment/Interventions ADLs/Self Care Home Management;DME Instruction;Gait training;Stair training;Functional mobility training;Therapeutic activities;Therapeutic exercise;Balance training;Neuromuscular re-education;Patient/family education;Orthotic Fit/Training;Manual techniques;Energy conservation;Taping   PT Next Visit Plan continue working strength and balance but adjust PRN due  to knee pain; continue to progress balance work.  NExt session add wall squats and progress static balance activities on foam.     PT Home Exercise  Plan Eval: bridges, supine clams with red TB, tandem stance, walking program       Patient will benefit from skilled therapeutic intervention in order to improve the following deficits and impairments:  Abnormal gait, Improper body mechanics, Pain, Decreased coordination, Decreased mobility, Postural dysfunction, Decreased activity tolerance, Decreased strength, Decreased balance, Decreased safety awareness, Difficulty walking  Visit Diagnosis: Muscle weakness (generalized)  Unsteadiness on feet  Difficulty in walking, not elsewhere classified     Problem List Patient Active Problem List   Diagnosis Date Noted  . History of uterine cancer 04/01/2017  . Closed right hip fracture ([place]) 04/09/2016  . Hip fracture ([place]) 04/09/2016  . 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  . Endometrial cancer, grade I ([place]) 03/21/2015  . Well woman exam with routine gynecological exam 03/15/2014  . Screening for malignant neoplasm of the cervix 03/15/2014  . Hx of cancer of endometrium 03/06/2013   Ihor Austin, [place]; [place]  Aldona Lento 04/25/2017, 9:57 AM  [place], [place], [address] Phone: [phone]   Fax:  [phone]  Name: JONIAH BEDNARSKI MRN: [id number] Date of Birth: [birth year]

## 2017-04-30 ENCOUNTER — Ambulatory Visit (HOSPITAL_COMMUNITY): Payer: Medicare Other | Admitting: Physical Therapy

## 2017-04-30 DIAGNOSIS — R262 Difficulty in walking, not elsewhere classified: Secondary | ICD-10-CM | POA: Diagnosis not present

## 2017-04-30 DIAGNOSIS — R2681 Unsteadiness on feet: Secondary | ICD-10-CM | POA: Diagnosis not present

## 2017-04-30 DIAGNOSIS — M6281 Muscle weakness (generalized): Secondary | ICD-10-CM | POA: Diagnosis not present

## 2017-04-30 NOTE — Therapy (Signed)
[place], [place], [address] Phone: [phone]   Fax:  [phone]  Physical Therapy Treatment (Re-Assessment)  Patient Details  Name: Brittany Mahoney MRN: [id number] Date of Birth: 1934/11/21 Referring Provider: Sinda Du  Encounter Date: 04/30/2017      PT End of Session - 04/30/17 1213    Visit Number 7   Number of Visits 13   Date for PT Re-Evaluation 05/21/17   Authorization Type Medicare/BCBS  (G-codes done 7th session)   Authorization Time Period 04/10/17 to 05/22/17   Authorization - Visit Number 7   Authorization - Number of Visits 17   PT Start Time 0900   PT Stop Time 0943   PT Time Calculation (min) 43 min   Activity Tolerance Patient tolerated treatment well   Behavior During Therapy [facility] for tasks assessed/performed      Past Medical History:  Diagnosis Date  . Broken hip ([place])   . Cancer ([place])    uterus  . Headache(784.0)    migraines after periods  . Hepatitis   . PMB (postmenopausal bleeding)     Past Surgical History:  Procedure Laterality Date  . ABDOMINAL HYSTERECTOMY     TAH and BSO  . DILATION AND CURETTAGE OF UTERUS    . INTRAMEDULLARY (IM) NAIL INTERTROCHANTERIC Right 04/10/2016   Procedure: INTRAMEDULLARY (IM) NAIL RIGHT HIP FRACTURE;  Surgeon: Mcarthur Rossetti, MD;  Location: [place];  Service: Orthopedics;  Laterality: Right;    There were no vitals filed for this visit.      Subjective Assessment - 04/30/17 0904    Subjective Patient arrives stating that she feels she is getting more steady; she tries not to use the cane or walker in her home but when she goes outside she does use her cane or walker.  Trying to stand up out of a chair is still difficult. Her biggest concern is her balance at this point. She rates herself at a 75/100 on a subjective scale.     Pertinent History hx R hip fracture with IM nail placement 8/17; hx uterine cancer (resolved)   Patient  Stated Goals get stronger, take care of L knee, improve balance    Currently in Pain? No/denies            [facility] PT Assessment - 04/30/17 0001      Strength   Right Hip Flexion 3/5   Right Hip Extension 3/5   Right Hip ABduction 3+/5   Left Hip Flexion 3/5   Left Hip Extension 3/5   Left Hip ABduction 4/5   Right Knee Flexion 4-/5   Right Knee Extension 5/5   Left Knee Flexion 4+/5   Left Knee Extension 5/5   Right Ankle Dorsiflexion 5/5   Left Ankle Dorsiflexion 5/5     Transfers   Five time sit to stand comments  43.5 UEs on thighs      High Level Balance   High Level Balance Comments tandem stance 3-6 seconds B; TUG 29 seconds with SPC/28 seconds without SPC                     [facility] Adult PT Treatment/Exercise - 04/30/17 0001      Knee/Hip Exercises: Supine   Bridges 20 reps   Bridges Limitations 3 second holds      Knee/Hip Exercises: Sidelying   Hip ABduction Both;1 set;10 reps   Hip ABduction Limitations cues for form  Knee/Hip Exercises: Prone   Hip Extension Both;1 set;10 reps   Hip Extension Limitations fatigue noted                 PT Education - 04/30/17 1213    Education provided Yes   Education Details progress with skilled PT services, POC moving forward, goal review    Person(s) Educated Patient   Methods Explanation   Comprehension Verbalized understanding          PT Short Term Goals - 04/30/17 0924      PT SHORT TERM GOAL #1   Title Patient to be able to complete 5x sit to stand in 20 seconds with UEs in order to demonstrate improved functional strength and mobility    Baseline 8/21- 43.5 using UEs    Time 3   Period Weeks   Status On-going     PT SHORT TERM GOAL #2   Title Patient to be able to verbalize 5/5 ways to reduce fall risk in order to demonstrate improved general safety    Baseline 8/21- able to name 1-2/5    Time 3   Period Weeks   Status On-going     PT SHORT TERM GOAL #3   Title  Patient to be consistent with regular progressive walking program, at least 5 days per week, in order to combat deleterious effects of chronic deconditioning and sedentary lifestyle    Baseline 8/21- walks 7 days/week, 12-15 minutes    Time 3   Period Weeks   Status Achieved     PT SHORT TERM GOAL #4   Title Paitent to be compliant with correct performance of appropriate HEP, to be updated PRN    Baseline 8/21- reports compliance    Time 1   Period Weeks   Status Achieved           PT Long Term Goals - 04/30/17 7989      PT LONG TERM GOAL #1   Title Patient to demonstrate improvement of MMT by at least one grade in all tested groups in order to improve gait and balance    Baseline 8/21- remains weak    Time 6   Period Weeks   Status On-going     PT LONG TERM GOAL #2   Title Patient to be able to complete TUG in 15 seconds with LRAD in order to show improved mobility and functional balance    Baseline 8/21- 28 seconds best    Time 6   Period Weeks   Status On-going     PT LONG TERM GOAL #3   Title Patient to be able to maintain tandem stance for at least 30 seconds on solid surface in order to show improved balance skills    Baseline 8/21- 3-6 seconds    Time 6   Period Weeks   Status On-going     PT LONG TERM GOAL #4   Title Patient to be participatory in regular exercise program, at least 3 days per week,  with L knee pain no more than 2/10 in order to maintain functional gains and prevent recurrence of condition    Baseline 8/21- ongoing, discussed in depth today    Time 6   Period Weeks   Status On-going               Plan - 04/30/17 1214    Clinical Impression Statement Re-assessment performed today. Patient appears to be making slow progress with skilled PT services as evidenced by  improvement in 5x sit to stand test and regular , however continues to demonstrate significant deficits in functional strength and balance. She does remain a relatively high  fall risk at this time. Recommend continuation of skilled PT services to attempt to further improve functional deficits and reduce fall risk moving forward.    Rehab Potential Good   Clinical Impairments Affecting Rehab Potential (+) motivated to participate; (-) sedentary lifestyle, chronic deconditioning    PT Frequency 2x / week   PT Duration 3 weeks   PT Treatment/Interventions ADLs/Self Care Home Management;DME Instruction;Gait training;Stair training;Functional mobility training;Therapeutic activities;Therapeutic exercise;Balance training;Neuromuscular re-education;Patient/family education;Orthotic Fit/Training;Manual techniques;Energy conservation;Taping   PT Next Visit Plan continue isolated proximal strength, increase balance focus. Be aware of knee pain. Update HEP.    PT Home Exercise Plan Eval: bridges, supine clams with red TB, tandem stance, walking program    Consulted and Agree with Plan of Care Patient      Patient will benefit from skilled therapeutic intervention in order to improve the following deficits and impairments:  Abnormal gait, Improper body mechanics, Pain, Decreased coordination, Decreased mobility, Postural dysfunction, Decreased activity tolerance, Decreased strength, Decreased balance, Decreased safety awareness, Difficulty walking  Visit Diagnosis: Muscle weakness (generalized)  Unsteadiness on feet  Difficulty in walking, not elsewhere classified       G-Codes - May 04, 2017 1215    Functional Assessment Tool Used (Outpatient Only) Based on skilled clinical assessment of strength, balance, gait, fall risk    Functional Limitation Mobility: Walking and moving around   Mobility: Walking and Moving Around Current Status ([id number]) At least 60 percent but less than 80 percent impaired, limited or restricted   Mobility: Walking and Moving Around Goal Status [phone]) At least 40 percent but less than 60 percent impaired, limited or restricted      Problem  List Patient Active Problem List   Diagnosis Date Noted  . History of uterine cancer 04/01/2017  . Closed right hip fracture ([place]) 04/09/2016  . Hip fracture ([place]) 04/09/2016  . 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  . Endometrial cancer, grade I ([place]) 03/21/2015  . Well woman exam with routine gynecological exam 03/15/2014  . Screening for malignant neoplasm of the cervix 03/15/2014  . Hx of cancer of endometrium 03/06/2013    Deniece Ree PT, DPT [place] [address] [place], [place], [address] Phone: [phone]   Fax:  [phone]  Name: Brittany Mahoney MRN: [id number] Date of Birth: 01-25-1935

## 2017-04-30 NOTE — Patient Instructions (Signed)
   HIP ABDUCTION - SIDELYING  While lying on your side, slowly raise up your top leg to the side. Keep your knee straight and maintain your toes pointed forward the entire time. Keep your leg in-line with your body.  The bottom leg can be bent to stabilize your body.  DO NOT LET YOUR HIPS ROLL BACKWARD- KEEP THEM STACKED ON TOP OF EACH OTHER.  Repeat 10 times each leg, twice a day.    PRONE HIP EXTENSION  While lying face down with your knee straight, slowly raise up leg off the ground. Maintain a straight knee the entire time.   Repeat 10 times each leg, twice a day.

## 2017-05-02 ENCOUNTER — Ambulatory Visit (HOSPITAL_COMMUNITY): Payer: Medicare Other | Admitting: Physical Therapy

## 2017-05-02 DIAGNOSIS — R262 Difficulty in walking, not elsewhere classified: Secondary | ICD-10-CM

## 2017-05-02 DIAGNOSIS — R2681 Unsteadiness on feet: Secondary | ICD-10-CM | POA: Diagnosis not present

## 2017-05-02 DIAGNOSIS — M6281 Muscle weakness (generalized): Secondary | ICD-10-CM | POA: Diagnosis not present

## 2017-05-02 NOTE — Therapy (Signed)
[place], [place], [address] Phone: [phone]   Fax:  [phone]  Physical Therapy Treatment  Patient Details  Name: Brittany Mahoney MRN: [id number] Date of Birth: 04-28-1935 Referring Provider: Sinda Du  Encounter Date: 05/02/2017      PT End of Session - 05/02/17 0946    Visit Number 8   Number of Visits 13   Date for PT Re-Evaluation 05/21/17   Authorization Type Medicare/BCBS  (G-codes done 7th session)   Authorization Time Period 04/10/17 to 05/22/17   Authorization - Visit Number 8   Authorization - Number of Visits 17   PT Start Time 0904   PT Stop Time 0943   PT Time Calculation (min) 39 min   Equipment Utilized During Treatment Gait belt   Activity Tolerance Patient tolerated treatment well   Behavior During Therapy [facility] for tasks assessed/performed      Past Medical History:  Diagnosis Date  . Broken hip ([place])   . Cancer ([place])    uterus  . Headache(784.0)    migraines after periods  . Hepatitis   . PMB (postmenopausal bleeding)     Past Surgical History:  Procedure Laterality Date  . ABDOMINAL HYSTERECTOMY     TAH and BSO  . DILATION AND CURETTAGE OF UTERUS    . INTRAMEDULLARY (IM) NAIL INTERTROCHANTERIC Right 04/10/2016   Procedure: INTRAMEDULLARY (IM) NAIL RIGHT HIP FRACTURE;  Surgeon: Mcarthur Rossetti, MD;  Location: [place];  Service: Orthopedics;  Laterality: Right;    There were no vitals filed for this visit.      Subjective Assessment - 05/02/17 0908    Subjective patient arrives stating she did a bit much yesterday, she went on  a walk that is much longer than she's used to. She is having a hard time finding a level place to walk. She feels like she is moving better.    Pertinent History hx R hip fracture with IM nail placement 8/17; hx uterine cancer (resolved)   Patient Stated Goals get stronger, take care of L knee, improve balance    Currently in Pain? Yes   Pain  Score 5    Pain Location Foot   Pain Orientation Right   Pain Descriptors / Indicators Throbbing   Pain Type Acute pain   Pain Radiating Towards none    Pain Onset Yesterday   Pain Frequency Intermittent   Aggravating Factors  walking on it    Pain Relieving Factors resting    Effect of Pain on Daily Activities none                          [facility] Adult PT Treatment/Exercise - 05/02/17 0001                   Knee/Hip Exercises: Seated   Other Seated Knee/Hip Exercises attempted hip walks but patient unalbe; instead performed seated hip hikes with mod verbal cues      Knee/Hip Exercises: Supine   Bridges Both;15 reps;2 sets   Bridges Limitations alternating staggered position      Knee/Hip Exercises: Sidelying   Hip ABduction Both;1 set;15 reps   Hip ABduction Limitations cues for form      Knee/Hip Exercises: Prone   Hip Extension Both;1 set;10 reps   Hip Extension Limitations fatigue noted              Balance Exercises - 05/02/17 3474  Balance Exercises: Standing   Tandem Stance Eyes open;Foam/compliant surface;3 reps;20 secs   Rockerboard Anterior/posterior;Lateral;EO;Intermittent UE support  x2 min each    Other Standing Exercises SLS AP and lateral ball rolls 3x3 each LE            PT Education - 05/02/17 0945    Education provided Yes   Education Details safety at home/fall prevention    Person(s) Educated Patient   Methods Explanation   Comprehension Verbalized understanding          PT Short Term Goals - 04/30/17 0924      PT SHORT TERM GOAL #1   Title Patient to be able to complete 5x sit to stand in 20 seconds with UEs in order to demonstrate improved functional strength and mobility    Baseline 8/21- 43.5 using UEs    Time 3   Period Weeks   Status On-going     PT SHORT TERM GOAL #2   Title Patient to be able to verbalize 5/5 ways to reduce fall risk in order to demonstrate improved general safety    Baseline  8/21- able to name 1-2/5    Time 3   Period Weeks   Status On-going     PT SHORT TERM GOAL #3   Title Patient to be consistent with regular progressive walking program, at least 5 days per week, in order to combat deleterious effects of chronic deconditioning and sedentary lifestyle    Baseline 8/21- walks 7 days/week, 12-15 minutes    Time 3   Period Weeks   Status Achieved     PT SHORT TERM GOAL #4   Title Paitent to be compliant with correct performance of appropriate HEP, to be updated PRN    Baseline 8/21- reports compliance    Time 1   Period Weeks   Status Achieved           PT Long Term Goals - 04/30/17 5643      PT LONG TERM GOAL #1   Title Patient to demonstrate improvement of MMT by at least one grade in all tested groups in order to improve gait and balance    Baseline 8/21- remains weak    Time 6   Period Weeks   Status On-going     PT LONG TERM GOAL #2   Title Patient to be able to complete TUG in 15 seconds with LRAD in order to show improved mobility and functional balance    Baseline 8/21- 28 seconds best    Time 6   Period Weeks   Status On-going     PT LONG TERM GOAL #3   Title Patient to be able to maintain tandem stance for at least 30 seconds on solid surface in order to show improved balance skills    Baseline 8/21- 3-6 seconds    Time 6   Period Weeks   Status On-going     PT LONG TERM GOAL #4   Title Patient to be participatory in regular exercise program, at least 3 days per week,  with L knee pain no more than 2/10 in order to maintain functional gains and prevent recurrence of condition    Baseline 8/21- ongoing, discussed in depth today    Time 6   Period Weeks   Status On-going               Plan - 05/02/17 0947    Clinical Impression Statement Patient arrives stating she feels she is doing  well but did a little too much yesterday trying to increase her walking distance. Continued to work on proximal strengthening as well  as increasing focus on balance training this session as time allowed due to high patient concern regarding her balance and falls. Patient continues to do well with skilled PT services moving forward.    Rehab Potential Good   Clinical Impairments Affecting Rehab Potential (+) motivated to participate; (-) sedentary lifestyle, chronic deconditioning    PT Frequency 2x / week   PT Duration 3 weeks   PT Treatment/Interventions ADLs/Self Care Home Management;DME Instruction;Gait training;Stair training;Functional mobility training;Therapeutic activities;Therapeutic exercise;Balance training;Neuromuscular re-education;Patient/family education;Orthotic Fit/Training;Manual techniques;Energy conservation;Taping   PT Next Visit Plan continue isolated proximal strength, increase balance focus. Be aware of knee pain. Update HEP.    PT Home Exercise Plan Eval: bridges, supine clams with red TB, tandem stance, walking program    Consulted and Agree with Plan of Care Patient      Patient will benefit from skilled therapeutic intervention in order to improve the following deficits and impairments:  Abnormal gait, Improper body mechanics, Pain, Decreased coordination, Decreased mobility, Postural dysfunction, Decreased activity tolerance, Decreased strength, Decreased balance, Decreased safety awareness, Difficulty walking  Visit Diagnosis: Muscle weakness (generalized)  Unsteadiness on feet  Difficulty in walking, not elsewhere classified     Problem List Patient Active Problem List   Diagnosis Date Noted  . History of uterine cancer 04/01/2017  . Closed right hip fracture ([place]) 04/09/2016  . Hip fracture ([place]) 04/09/2016  . 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  . Endometrial cancer, grade I ([place]) 03/21/2015  . Well woman exam with routine gynecological exam 03/15/2014  . Screening for malignant neoplasm of the cervix 03/15/2014  . Hx of cancer of endometrium 03/06/2013    Deniece Ree PT,  DPT [place] [address] [place], [place], [address] Phone: [phone]   Fax:  [phone]  Name: Brittany Mahoney MRN: [id number] Date of Birth: 1935/05/16

## 2017-05-06 ENCOUNTER — Ambulatory Visit (HOSPITAL_COMMUNITY): Payer: Medicare Other | Admitting: Physical Therapy

## 2017-05-06 DIAGNOSIS — R2681 Unsteadiness on feet: Secondary | ICD-10-CM | POA: Diagnosis not present

## 2017-05-06 DIAGNOSIS — M6281 Muscle weakness (generalized): Secondary | ICD-10-CM

## 2017-05-06 DIAGNOSIS — R262 Difficulty in walking, not elsewhere classified: Secondary | ICD-10-CM

## 2017-05-06 NOTE — Therapy (Signed)
[place], [place], [address] Phone: [phone]   Fax:  [phone]  Physical Therapy Treatment  Patient Details  Name: Brittany Mahoney MRN: [id number] Date of Birth: 08/04/1935 Referring Provider: Sinda Du  Encounter Date: 05/06/2017      PT End of Session - 05/06/17 0943    Visit Number 9   Number of Visits 13   Date for PT Re-Evaluation 05/21/17   Authorization Type Medicare/BCBS  (G-codes done 7th session)   Authorization Time Period 04/10/17 to 05/22/17   Authorization - Visit Number 9   Authorization - Number of Visits 17   PT Start Time 0902   PT Stop Time 0942   PT Time Calculation (min) 40 min   Equipment Utilized During Treatment Gait belt   Activity Tolerance Patient tolerated treatment well   Behavior During Therapy [facility] for tasks assessed/performed      Past Medical History:  Diagnosis Date  . Broken hip ([place])   . Cancer ([place])    uterus  . Headache(784.0)    migraines after periods  . Hepatitis   . PMB (postmenopausal bleeding)     Past Surgical History:  Procedure Laterality Date  . ABDOMINAL HYSTERECTOMY     TAH and BSO  . DILATION AND CURETTAGE OF UTERUS    . INTRAMEDULLARY (IM) NAIL INTERTROCHANTERIC Right 04/10/2016   Procedure: INTRAMEDULLARY (IM) NAIL RIGHT HIP FRACTURE;  Surgeon: Mcarthur Rossetti, MD;  Location: [place];  Service: Orthopedics;  Laterality: Right;    There were no vitals filed for this visit.      Subjective Assessment - 05/06/17 0904    Subjective Patient arrives stating her knees hurt after last session but overall she is feeling good. She tried to run this weekend but her knee stopped her. No falls since last time.    Pertinent History hx R hip fracture with IM nail placement 8/17; hx uterine cancer (resolved)   Patient Stated Goals get stronger, take care of L knee, improve balance    Currently in Pain? No/denies                          [facility] Adult PT Treatment/Exercise - 05/06/17 0001      Knee/Hip Exercises: Supine   Single Leg Bridge Both;1 set;15 reps   Straight Leg Raises Both;1 set;15 reps   Other Supine Knee/Hip Exercises SLR direction to hip ABD with 2 second holds 1x15 B      Knee/Hip Exercises: Sidelying   Hip ABduction Both;1 set;15 reps   Hip ABduction Limitations cues for form      Knee/Hip Exercises: Prone   Hip Extension Both;1 set;10 reps   Hip Extension Limitations fatigue noted    Straight Leg Raises Limitations partial crawl onto table x5 B; attempted full quadruped but unable to tolerate due to pain; seated core work: opposite UE/LE flexion, cone rotations. lateral trunk flexion on solid surface LEs elevated off of floor via high low table                 PT Education - 05/06/17 0943    Education provided No          PT Short Term Goals - 04/30/17 0924      PT SHORT TERM GOAL #1   Title Patient to be able to complete 5x sit to stand in 20 seconds with UEs in order to demonstrate improved functional strength  and mobility    Baseline 8/21- 43.5 using UEs    Time 3   Period Weeks   Status On-going     PT SHORT TERM GOAL #2   Title Patient to be able to verbalize 5/5 ways to reduce fall risk in order to demonstrate improved general safety    Baseline 8/21- able to name 1-2/5    Time 3   Period Weeks   Status On-going     PT SHORT TERM GOAL #3   Title Patient to be consistent with regular progressive walking program, at least 5 days per week, in order to combat deleterious effects of chronic deconditioning and sedentary lifestyle    Baseline 8/21- walks 7 days/week, 12-15 minutes    Time 3   Period Weeks   Status Achieved     PT SHORT TERM GOAL #4   Title Paitent to be compliant with correct performance of appropriate HEP, to be updated PRN    Baseline 8/21- reports compliance    Time 1   Period Weeks   Status Achieved            PT Long Term Goals - 04/30/17 4132      PT LONG TERM GOAL #1   Title Patient to demonstrate improvement of MMT by at least one grade in all tested groups in order to improve gait and balance    Baseline 8/21- remains weak    Time 6   Period Weeks   Status On-going     PT LONG TERM GOAL #2   Title Patient to be able to complete TUG in 15 seconds with LRAD in order to show improved mobility and functional balance    Baseline 8/21- 28 seconds best    Time 6   Period Weeks   Status On-going     PT LONG TERM GOAL #3   Title Patient to be able to maintain tandem stance for at least 30 seconds on solid surface in order to show improved balance skills    Baseline 8/21- 3-6 seconds    Time 6   Period Weeks   Status On-going     PT LONG TERM GOAL #4   Title Patient to be participatory in regular exercise program, at least 3 days per week,  with L knee pain no more than 2/10 in order to maintain functional gains and prevent recurrence of condition    Baseline 8/21- ongoing, discussed in depth today    Time 6   Period Weeks   Status On-going               Plan - 05/06/17 0944    Clinical Impression Statement Patient arrives stating that she is doing OK, she tried to run/walk very fast this weekend but it was limited by knee pain. Continued with progression of functional strengthening, especially for hip musculature today, as well as introduction of increased core training modified as needed due to knee pain. Patient remains motivated to continue with skilled PT services. Noted very poor strength of hip abductors with hip flexors often functionally compensating despite PT cues. Fatigue noted throughout session.    Rehab Potential Good   Clinical Impairments Affecting Rehab Potential (+) motivated to participate; (-) sedentary lifestyle, chronic deconditioning    PT Frequency 2x / week   PT Duration 3 weeks   PT Treatment/Interventions ADLs/Self Care Home Management;DME  Instruction;Gait training;Stair training;Functional mobility training;Therapeutic activities;Therapeutic exercise;Balance training;Neuromuscular re-education;Patient/family education;Orthotic Fit/Training;Manual techniques;Energy conservation;Taping   PT Next Visit  Plan continue isolated proximal strength, increase balance focus. Be aware of knee pain. Continue core work.    PT Home Exercise Plan Eval: bridges, supine clams with red TB, tandem stance, walking program    Consulted and Agree with Plan of Care Patient      Patient will benefit from skilled therapeutic intervention in order to improve the following deficits and impairments:  Abnormal gait, Improper body mechanics, Pain, Decreased coordination, Decreased mobility, Postural dysfunction, Decreased activity tolerance, Decreased strength, Decreased balance, Decreased safety awareness, Difficulty walking  Visit Diagnosis: Muscle weakness (generalized)  Unsteadiness on feet  Difficulty in walking, not elsewhere classified     Problem List Patient Active Problem List   Diagnosis Date Noted  . History of uterine cancer 04/01/2017  . Closed right hip fracture ([place]) 04/09/2016  . Hip fracture ([place]) 04/09/2016  . 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  . Endometrial cancer, grade I ([place]) 03/21/2015  . Well woman exam with routine gynecological exam 03/15/2014  . Screening for malignant neoplasm of the cervix 03/15/2014  . Hx of cancer of endometrium 03/06/2013    Deniece Ree PT, DPT [place] [address] [place], [place], [address] Phone: [phone]   Fax:  [phone]  Name: Brittany Mahoney MRN: [id number] Date of Birth: May 22, 1935

## 2017-05-08 ENCOUNTER — Ambulatory Visit (HOSPITAL_COMMUNITY): Payer: Medicare Other | Admitting: Physical Therapy

## 2017-05-08 DIAGNOSIS — R2681 Unsteadiness on feet: Secondary | ICD-10-CM | POA: Diagnosis not present

## 2017-05-08 DIAGNOSIS — M6281 Muscle weakness (generalized): Secondary | ICD-10-CM

## 2017-05-08 DIAGNOSIS — R262 Difficulty in walking, not elsewhere classified: Secondary | ICD-10-CM | POA: Diagnosis not present

## 2017-05-08 NOTE — Therapy (Signed)
[place], [place], [address] Phone: [phone]   Fax:  [phone]  Physical Therapy Treatment  Patient Details  Name: Brittany Mahoney MRN: [id number] Date of Birth: 05-22-1935 Referring Provider: Sinda Du  Encounter Date: 05/08/2017      PT End of Session - 05/08/17 0946    Visit Number 10   Number of Visits 13   Date for PT Re-Evaluation 05/21/17   Authorization Type Medicare/BCBS  (G-codes done 7th session)   Authorization Time Period 04/10/17 to 05/22/17   Authorization - Visit Number 10   Authorization - Number of Visits 17   PT Start Time 0906   PT Stop Time 0946   PT Time Calculation (min) 40 min   Equipment Utilized During Treatment Gait belt   Activity Tolerance Patient tolerated treatment well   Behavior During Therapy [facility] for tasks assessed/performed      Past Medical History:  Diagnosis Date  . Broken hip ([place])   . Cancer ([place])    uterus  . Headache(784.0)    migraines after periods  . Hepatitis   . PMB (postmenopausal bleeding)     Past Surgical History:  Procedure Laterality Date  . ABDOMINAL HYSTERECTOMY     TAH and BSO  . DILATION AND CURETTAGE OF UTERUS    . INTRAMEDULLARY (IM) NAIL INTERTROCHANTERIC Right 04/10/2016   Procedure: INTRAMEDULLARY (IM) NAIL RIGHT HIP FRACTURE;  Surgeon: Mcarthur Rossetti, MD;  Location: [place];  Service: Orthopedics;  Laterality: Right;    There were no vitals filed for this visit.      Subjective Assessment - 05/08/17 0949    Subjective pt reports she is having no pain in her hip.  States her Lt knee hurts/pops when she increases weight bearing through it.  Comes today using short base QC.                         [facility] Adult PT Treatment/Exercise - 05/08/17 0001      Knee/Hip Exercises: Standing   SLS with Vectors 5X5" holds with 1 HHA   Other Standing Knee Exercises hip hikes x10 with and without swing B U HHA               Balance Exercises - 05/08/17 0923      Balance Exercises: Standing   Tandem Stance Eyes open;Foam/compliant surface;2 reps;30 secs   Tandem Gait Forward;2 reps   Retro Gait 2 reps   Step Over Hurdles / Cones 6 inch hurdles no HHA cues to reduce circumduction and compenstaions              PT Short Term Goals - 04/30/17 0924      PT SHORT TERM GOAL #1   Title Patient to be able to complete 5x sit to stand in 20 seconds with UEs in order to demonstrate improved functional strength and mobility    Baseline 8/21- 43.5 using UEs    Time 3   Period Weeks   Status On-going     PT SHORT TERM GOAL #2   Title Patient to be able to verbalize 5/5 ways to reduce fall risk in order to demonstrate improved general safety    Baseline 8/21- able to name 1-2/5    Time 3   Period Weeks   Status On-going     PT SHORT TERM GOAL #3   Title Patient to be consistent with regular progressive walking  program, at least 5 days per week, in order to combat deleterious effects of chronic deconditioning and sedentary lifestyle    Baseline 8/21- walks 7 days/week, 12-15 minutes    Time 3   Period Weeks   Status Achieved     PT SHORT TERM GOAL #4   Title Paitent to be compliant with correct performance of appropriate HEP, to be updated PRN    Baseline 8/21- reports compliance    Time 1   Period Weeks   Status Achieved           PT Long Term Goals - 04/30/17 4431      PT LONG TERM GOAL #1   Title Patient to demonstrate improvement of MMT by at least one grade in all tested groups in order to improve gait and balance    Baseline 8/21- remains weak    Time 6   Period Weeks   Status On-going     PT LONG TERM GOAL #2   Title Patient to be able to complete TUG in 15 seconds with LRAD in order to show improved mobility and functional balance    Baseline 8/21- 28 seconds best    Time 6   Period Weeks   Status On-going     PT LONG TERM GOAL #3   Title Patient to be able to  maintain tandem stance for at least 30 seconds on solid surface in order to show improved balance skills    Baseline 8/21- 3-6 seconds    Time 6   Period Weeks   Status On-going     PT LONG TERM GOAL #4   Title Patient to be participatory in regular exercise program, at least 3 days per week,  with L knee pain no more than 2/10 in order to maintain functional gains and prevent recurrence of condition    Baseline 8/21- ongoing, discussed in depth today    Time 6   Period Weeks   Status On-going               Plan - 05/08/17 1004    Clinical Impression Statement Continued with functional strengthening and balance actvities.  Pt with most difficulty shifting weight onto Lt LE due to knee instability.  Pt motivated to attempt actvities despite this.   cues needed for posture and core stab with therex.  Some popping and pain noted when weight shifted to Lt LE.  Tactile cues to activate glutes with hip hike. No rest break needed during session today.    Rehab Potential Good   Clinical Impairments Affecting Rehab Potential (+) motivated to participate; (-) sedentary lifestyle, chronic deconditioning    PT Frequency 2x / week   PT Duration 3 weeks   PT Treatment/Interventions ADLs/Self Care Home Management;DME Instruction;Gait training;Stair training;Functional mobility training;Therapeutic activities;Therapeutic exercise;Balance training;Neuromuscular re-education;Patient/family education;Orthotic Fit/Training;Manual techniques;Energy conservation;Taping   PT Next Visit Plan continue isolated proximal strength, increase balance focus. Be aware of knee pain. Continue core work.    PT Home Exercise Plan Eval: bridges, supine clams with red TB, tandem stance, walking program    Consulted and Agree with Plan of Care Patient      Patient will benefit from skilled therapeutic intervention in order to improve the following deficits and impairments:  Abnormal gait, Improper body mechanics, Pain,  Decreased coordination, Decreased mobility, Postural dysfunction, Decreased activity tolerance, Decreased strength, Decreased balance, Decreased safety awareness, Difficulty walking  Visit Diagnosis: Muscle weakness (generalized)  Unsteadiness on feet  Difficulty in walking, not  elsewhere classified     Problem List Patient Active Problem List   Diagnosis Date Noted  . History of uterine cancer 04/01/2017  . Closed right hip fracture ([place]) 04/09/2016  . Hip fracture ([place]) 04/09/2016  . 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  . Endometrial cancer, grade I ([place]) 03/21/2015  . Well woman exam with routine gynecological exam 03/15/2014  . Screening for malignant neoplasm of the cervix 03/15/2014  . Hx of cancer of endometrium 03/06/2013   Teena Irani, PTA/CLT [phone]  Teena Irani 05/08/2017, 10:06 AM  [place], [place], [address] Phone: [phone]   Fax:  [phone]  Name: Brittany Mahoney MRN: [id number] Date of Birth: [birth year]

## 2017-05-14 ENCOUNTER — Ambulatory Visit (HOSPITAL_COMMUNITY): Payer: Medicare Other | Attending: Pulmonary Disease | Admitting: Physical Therapy

## 2017-05-14 DIAGNOSIS — R262 Difficulty in walking, not elsewhere classified: Secondary | ICD-10-CM | POA: Diagnosis not present

## 2017-05-14 DIAGNOSIS — M6281 Muscle weakness (generalized): Secondary | ICD-10-CM | POA: Insufficient documentation

## 2017-05-14 DIAGNOSIS — R2681 Unsteadiness on feet: Secondary | ICD-10-CM | POA: Insufficient documentation

## 2017-05-14 NOTE — Therapy (Signed)
[place] [address] [place], [place], [address] Phone: [phone]   Fax:  [phone]  Physical Therapy Treatment  Patient Details  Name: Brittany Mahoney MRN: [id number] Date of Birth: 1934-09-15 Referring Provider: Sinda Du  Encounter Date: 05/14/2017      PT End of Session - 05/14/17 0946    Visit Number 11   Number of Visits 13   Date for PT Re-Evaluation 05/21/17   Authorization Type Medicare/BCBS  (G-codes done 7th session)   Authorization Time Period 04/10/17 to 05/22/17   Authorization - Visit Number 11   Authorization - Number of Visits 17   PT Start Time 0903   PT Stop Time 0945   PT Time Calculation (min) 42 min   Activity Tolerance Patient tolerated treatment well   Behavior During Therapy [facility] for tasks assessed/performed      Past Medical History:  Diagnosis Date  . Broken hip ([place])   . Cancer ([place])    uterus  . Headache(784.0)    migraines after periods  . Hepatitis   . PMB (postmenopausal bleeding)     Past Surgical History:  Procedure Laterality Date  . ABDOMINAL HYSTERECTOMY     TAH and BSO  . DILATION AND CURETTAGE OF UTERUS    . INTRAMEDULLARY (IM) NAIL INTERTROCHANTERIC Right 04/10/2016   Procedure: INTRAMEDULLARY (IM) NAIL RIGHT HIP FRACTURE;  Surgeon: Mcarthur Rossetti, MD;  Location: [place];  Service: Orthopedics;  Laterality: Right;    There were no vitals filed for this visit.      Subjective Assessment - 05/14/17 0906    Subjective Patient arrives stating that she is doing OK, her knees got flared when she mopped the floor    Pertinent History hx R hip fracture with IM nail placement 8/17; hx uterine cancer (resolved)   Patient Stated Goals get stronger, take care of L knee, improve balance    Currently in Pain? No/denies                         [facility] Adult PT Treatment/Exercise - 05/14/17 0001      Knee/Hip Exercises: Seated   Other Seated Knee/Hip Exercises  seated on dynadisc: cone rotations with cross midline reach, seated opposite UE/LE flexion, lateral trunk crunches      Knee/Hip Exercises: Supine   Short Arc Quad Sets Both;1 set;15 reps   Short Arc Quad Sets Limitations 5#   Single Leg Bridge Both;1 set;15 reps   Other Supine Knee/Hip Exercises SLR direction to hip ABD with 2 second holds 1x15 B   cues to correct form              Balance Exercises - 05/14/17 0929      Balance Exercises: Standing   Standing Eyes Closed Narrow base of support (BOS);Foam/compliant surface;3 reps;30 secs   Tandem Stance Eyes open;3 reps;15 secs;Foam/compliant surface   Retro Gait 3 reps;Other (comment)  in // bars            PT Education - 05/14/17 0946    Education provided Yes   Education Details imoprtance of hydration   Person(s) Educated Patient   Methods Explanation   Comprehension Verbalized understanding          PT Short Term Goals - 04/30/17 0924      PT SHORT TERM GOAL #1   Title Patient to be able to complete 5x sit to stand in 20 seconds with UEs  in order to demonstrate improved functional strength and mobility    Baseline 8/21- 43.5 using UEs    Time 3   Period Weeks   Status On-going     PT SHORT TERM GOAL #2   Title Patient to be able to verbalize 5/5 ways to reduce fall risk in order to demonstrate improved general safety    Baseline 8/21- able to name 1-2/5    Time 3   Period Weeks   Status On-going     PT SHORT TERM GOAL #3   Title Patient to be consistent with regular progressive walking program, at least 5 days per week, in order to combat deleterious effects of chronic deconditioning and sedentary lifestyle    Baseline 8/21- walks 7 days/week, 12-15 minutes    Time 3   Period Weeks   Status Achieved     PT SHORT TERM GOAL #4   Title Paitent to be compliant with correct performance of appropriate HEP, to be updated PRN    Baseline 8/21- reports compliance    Time 1   Period Weeks   Status  Achieved           PT Long Term Goals - 04/30/17 1610      PT LONG TERM GOAL #1   Title Patient to demonstrate improvement of MMT by at least one grade in all tested groups in order to improve gait and balance    Baseline 8/21- remains weak    Time 6   Period Weeks   Status On-going     PT LONG TERM GOAL #2   Title Patient to be able to complete TUG in 15 seconds with LRAD in order to show improved mobility and functional balance    Baseline 8/21- 28 seconds best    Time 6   Period Weeks   Status On-going     PT LONG TERM GOAL #3   Title Patient to be able to maintain tandem stance for at least 30 seconds on solid surface in order to show improved balance skills    Baseline 8/21- 3-6 seconds    Time 6   Period Weeks   Status On-going     PT LONG TERM GOAL #4   Title Patient to be participatory in regular exercise program, at least 3 days per week,  with L knee pain no more than 2/10 in order to maintain functional gains and prevent recurrence of condition    Baseline 8/21- ongoing, discussed in depth today    Time 6   Period Weeks   Status On-going               Plan - 05/14/17 0947    Clinical Impression Statement Patient arrives today reporting she is doing well but continuing to complain of knee pain during activity; continued with proximal muscle strengthening as well as core/trunk work and balance/standing exercises as tolerated by knee pain this session. Noted fatigue and very poor core strength during this session. Recommend reassessment within the next couple of sessions to assess progress/ongoing potential with skilled PT services.    Rehab Potential Good   Clinical Impairments Affecting Rehab Potential (+) motivated to participate; (-) sedentary lifestyle, chronic deconditioning    PT Frequency 2x / week   PT Duration 3 weeks   PT Treatment/Interventions ADLs/Self Care Home Management;DME Instruction;Gait training;Stair training;Functional mobility  training;Therapeutic activities;Therapeutic exercise;Balance training;Neuromuscular re-education;Patient/family education;Orthotic Fit/Training;Manual techniques;Energy conservation;Taping   PT Next Visit Plan continue core work and balance focus. Caution  with SLS based activities due to knee pain.    PT Home Exercise Plan Eval: bridges, supine clams with red TB, tandem stance, walking program    Consulted and Agree with Plan of Care Patient      Patient will benefit from skilled therapeutic intervention in order to improve the following deficits and impairments:  Abnormal gait, Improper body mechanics, Pain, Decreased coordination, Decreased mobility, Postural dysfunction, Decreased activity tolerance, Decreased strength, Decreased balance, Decreased safety awareness, Difficulty walking  Visit Diagnosis: Muscle weakness (generalized)  Unsteadiness on feet  Difficulty in walking, not elsewhere classified     Problem List Patient Active Problem List   Diagnosis Date Noted  . History of uterine cancer 04/01/2017  . Closed right hip fracture ([place]) 04/09/2016  . Hip fracture ([place]) 04/09/2016  . 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  . Endometrial cancer, grade I ([place]) 03/21/2015  . Well woman exam with routine gynecological exam 03/15/2014  . Screening for malignant neoplasm of the cervix 03/15/2014  . Hx of cancer of endometrium 03/06/2013    Deniece Ree PT, DPT [place] [address] [place], [place], [address] Phone: [phone]   Fax:  [phone]  Name: Brittany Mahoney MRN: [id number] Date of Birth: 08-04-1935

## 2017-05-16 ENCOUNTER — Ambulatory Visit (HOSPITAL_COMMUNITY): Payer: Medicare Other

## 2017-05-16 DIAGNOSIS — R2681 Unsteadiness on feet: Secondary | ICD-10-CM | POA: Diagnosis not present

## 2017-05-16 DIAGNOSIS — R262 Difficulty in walking, not elsewhere classified: Secondary | ICD-10-CM | POA: Diagnosis not present

## 2017-05-16 DIAGNOSIS — M6281 Muscle weakness (generalized): Secondary | ICD-10-CM

## 2017-05-16 NOTE — Therapy (Signed)
[place], [place], [address] Phone: [phone]   Fax:  [phone]  Physical Therapy Treatment  Patient Details  Name: Brittany Mahoney MRN: [id number] Date of Birth: 01/16/1935 Referring Provider: Sinda Du  Encounter Date: 05/16/2017      PT End of Session - 05/16/17 0912    Visit Number 12   Number of Visits 13   Date for PT Re-Evaluation 05/21/17   Authorization Type Medicare/BCBS  (G-codes done 7th session)   Authorization Time Period 04/10/17 to 05/22/17   Authorization - Visit Number 12   Authorization - Number of Visits 17   PT Start Time 0907   PT Stop Time 0945   PT Time Calculation (min) 38 min   Equipment Utilized During Treatment Gait belt   Activity Tolerance Patient tolerated treatment well;Patient limited by fatigue   Behavior During Therapy [facility] for tasks assessed/performed      Past Medical History:  Diagnosis Date  . Broken hip ([place])   . Cancer ([place])    uterus  . Headache(784.0)    migraines after periods  . Hepatitis   . PMB (postmenopausal bleeding)     Past Surgical History:  Procedure Laterality Date  . ABDOMINAL HYSTERECTOMY     TAH and BSO  . DILATION AND CURETTAGE OF UTERUS    . INTRAMEDULLARY (IM) NAIL INTERTROCHANTERIC Right 04/10/2016   Procedure: INTRAMEDULLARY (IM) NAIL RIGHT HIP FRACTURE;  Surgeon: Mcarthur Rossetti, MD;  Location: [place];  Service: Orthopedics;  Laterality: Right;    There were no vitals filed for this visit.      Subjective Assessment - 05/16/17 0911    Subjective Pt stated she is feeling good today, she can feel a storm is coming in her knee due to arthritis.  No reports of current pain     Pertinent History hx R hip fracture with IM nail placement 8/17; hx uterine cancer (resolved)   Patient Stated Goals get stronger, take care of L knee, improve balance    Currently in Pain? No/denies               Balance Exercises - 05/16/17  0937      Balance Exercises: Standing   Tandem Stance Eyes open;Foam/compliant surface;3 reps;30 secs  last set with head turns   Tandem Gait Forward;2 reps   Sidestepping Foam/compliant support;2 reps;Theraband  RTB   Step Over Hurdles / Cones 6in hurdle side step over 1rep reports of knee pain and DC   Marching Limitations purple theraband shoulder extension then marching 5x reps alternating with 3-5" holds             PT Short Term Goals - 04/30/17 0924      PT SHORT TERM GOAL #1   Title Patient to be able to complete 5x sit to stand in 20 seconds with UEs in order to demonstrate improved functional strength and mobility    Baseline 8/21- 43.5 using UEs    Time 3   Period Weeks   Status On-going     PT SHORT TERM GOAL #2   Title Patient to be able to verbalize 5/5 ways to reduce fall risk in order to demonstrate improved general safety    Baseline 8/21- able to name 1-2/5    Time 3   Period Weeks   Status On-going     PT SHORT TERM GOAL #3   Title Patient to be consistent with regular progressive walking program,  at least 5 days per week, in order to combat deleterious effects of chronic deconditioning and sedentary lifestyle    Baseline 8/21- walks 7 days/week, 12-15 minutes    Time 3   Period Weeks   Status Achieved     PT SHORT TERM GOAL #4   Title Paitent to be compliant with correct performance of appropriate HEP, to be updated PRN    Baseline 8/21- reports compliance    Time 1   Period Weeks   Status Achieved           PT Long Term Goals - 04/30/17 2694      PT LONG TERM GOAL #1   Title Patient to demonstrate improvement of MMT by at least one grade in all tested groups in order to improve gait and balance    Baseline 8/21- remains weak    Time 6   Period Weeks   Status On-going     PT LONG TERM GOAL #2   Title Patient to be able to complete TUG in 15 seconds with LRAD in order to show improved mobility and functional balance    Baseline 8/21-  28 seconds best    Time 6   Period Weeks   Status On-going     PT LONG TERM GOAL #3   Title Patient to be able to maintain tandem stance for at least 30 seconds on solid surface in order to show improved balance skills    Baseline 8/21- 3-6 seconds    Time 6   Period Weeks   Status On-going     PT LONG TERM GOAL #4   Title Patient to be participatory in regular exercise program, at least 3 days per week,  with L knee pain no more than 2/10 in order to maintain functional gains and prevent recurrence of condition    Baseline 8/21- ongoing, discussed in depth today    Time 6   Period Weeks   Status On-going               Plan - 05/16/17 0957    Clinical Impression Statement Session focus on balance training and education on importance of improving posture to assist with balance and reduce anterior knee pain with standing.  Min A required for safety with static and dynamic balance activities with moderate cueing to improve posture and gaze to improve awareness of surrounding.  Added marching activities with theraband for posture and core strengthening.  Pt tolerated well to session with fatigue with tasks, did c/o knee pain wiht SLS (marching) activities, held all SLS activities following.   Rehab Potential Good   Clinical Impairments Affecting Rehab Potential (+) motivated to participate; (-) sedentary lifestyle, chronic deconditioning    PT Frequency 2x / week   PT Duration 3 weeks   PT Treatment/Interventions ADLs/Self Care Home Management;DME Instruction;Gait training;Stair training;Functional mobility training;Therapeutic activities;Therapeutic exercise;Balance training;Neuromuscular re-education;Patient/family education;Orthotic Fit/Training;Manual techniques;Energy conservation;Taping   PT Next Visit Plan continue core work and balance focus. Caution with SLS based activities due to knee pain.    PT Home Exercise Plan Eval: bridges, supine clams with red TB, tandem stance,  walking program       Patient will benefit from skilled therapeutic intervention in order to improve the following deficits and impairments:  Abnormal gait, Improper body mechanics, Pain, Decreased coordination, Decreased mobility, Postural dysfunction, Decreased activity tolerance, Decreased strength, Decreased balance, Decreased safety awareness, Difficulty walking  Visit Diagnosis: Muscle weakness (generalized)  Unsteadiness on feet  Difficulty in  walking, not elsewhere classified     Problem List Patient Active Problem List   Diagnosis Date Noted  . History of uterine cancer 04/01/2017  . Closed right hip fracture ([place]) 04/09/2016  . Hip fracture ([place]) 04/09/2016  . 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  . Endometrial cancer, grade I ([place]) 03/21/2015  . Well woman exam with routine gynecological exam 03/15/2014  . Screening for malignant neoplasm of the cervix 03/15/2014  . Hx of cancer of endometrium 03/06/2013   Ihor Austin, [place]; [place]  Aldona Lento 05/16/2017, 6:29 PM  [place], [place], [address] Phone: [phone]   Fax:  [phone]  Name: Brittany Mahoney MRN: [id number] Date of Birth: 1935-07-27

## 2017-05-21 ENCOUNTER — Ambulatory Visit (HOSPITAL_COMMUNITY): Payer: Medicare Other | Admitting: Physical Therapy

## 2017-05-21 DIAGNOSIS — R2681 Unsteadiness on feet: Secondary | ICD-10-CM | POA: Diagnosis not present

## 2017-05-21 DIAGNOSIS — M6281 Muscle weakness (generalized): Secondary | ICD-10-CM

## 2017-05-21 DIAGNOSIS — R262 Difficulty in walking, not elsewhere classified: Secondary | ICD-10-CM | POA: Diagnosis not present

## 2017-05-21 NOTE — Therapy (Signed)
[place], [place], [address] Phone: [phone]   Fax:  [phone]  Physical Therapy Treatment (Re-Assessment)  Patient Details  Name: Brittany Mahoney MRN: [id number] Date of Birth: 26-Jan-1935 Referring Provider: Sinda Du   Encounter Date: 05/21/2017      PT End of Session - 05/21/17 1156    Visit Number 13   Number of Visits 14   Date for PT Re-Evaluation 06/05/17   Authorization Type Medicare/BCBS  (G-codes done 13th session)   Authorization Time Period 05/14/48 to 6/75/91; recert done 6/38   Authorization - Visit Number 13   Authorization - Number of Visits 23   PT Start Time 0910  patient arrived late    PT Stop Time 0942   PT Time Calculation (min) 32 min   Activity Tolerance Patient tolerated treatment well   Behavior During Therapy [facility] for tasks assessed/performed      Past Medical History:  Diagnosis Date  . Broken hip ([place])   . Cancer ([place])    uterus  . Headache(784.0)    migraines after periods  . Hepatitis   . PMB (postmenopausal bleeding)     Past Surgical History:  Procedure Laterality Date  . ABDOMINAL HYSTERECTOMY     TAH and BSO  . DILATION AND CURETTAGE OF UTERUS    . INTRAMEDULLARY (IM) NAIL INTERTROCHANTERIC Right 04/10/2016   Procedure: INTRAMEDULLARY (IM) NAIL RIGHT HIP FRACTURE;  Surgeon: Mcarthur Rossetti, MD;  Location: [place];  Service: Orthopedics;  Laterality: Right;    There were no vitals filed for this visit.      Subjective Assessment - 05/21/17 0912    Subjective Patient arrives late today. She states she feels she has made quite a bit of progress, she feels she is moving much better at home and is able to do more at home as well. When she gets tired she will use her walker sometimes. No falls or close calls recently. She rates herself as being 80/100 right now, her knees and joints bother her quite a bit, joint pain is her biggest concern right now.    Pertinent History hx R hip fracture with IM nail placement 8/17; hx uterine cancer (resolved)   Patient Stated Goals get stronger, take care of L knee, improve balance    Currently in Pain? No/denies            [facility] PT Assessment - 05/21/17 0001      Assessment   Medical Diagnosis history of hip fracture    Referring Provider Sinda Du    Onset Date/Surgical Date --  August 2017   Next MD Visit Sinda Du November 2018   Prior Therapy PT after her IM nail surgery, went to [facility], followed by [facility]PT last year. No PT recently.      Precautions   Precautions Fall     Restrictions   Weight Bearing Restrictions No     Balance Screen   Has the patient fallen in the past 6 months No   Has the patient had a decrease in activity level because of a fear of falling?  No   Is the patient reluctant to leave their home because of a fear of falling?  No     Prior Function   Level of Independence Independent;Independent with basic ADLs;Independent with gait;Independent with transfers;Requires assistive device for independence   Vocation Retired     Strength   Right Hip Flexion 4/5  Right Hip Extension 3-/5   Right Hip ABduction 4+/5   Left Hip Flexion 4/5   Left Hip Extension 3-/5   Left Hip ABduction 4+/5   Right Knee Flexion 4/5   Right Knee Extension 5/5   Left Knee Flexion 4/5   Left Knee Extension 5/5   Right Ankle Dorsiflexion 5/5   Left Ankle Dorsiflexion 5/5     Transfers   Five time sit to stand comments  35.4 UEs on thighs      High Level Balance   High Level Balance Comments tandem stance 10-12 seconds B, TUG 25 seconds with and without SBQC                              PT Education - 05/21/17 1156    Education provided Yes   Education Details exam findings, POC moving forward; DC in one session    Person(s) Educated Patient   Methods Explanation   Comprehension Verbalized understanding          PT Short Term Goals -  05/21/17 0930      PT SHORT TERM GOAL #1   Title Patient to be able to complete 5x sit to stand in 20 seconds with UEs in order to demonstrate improved functional strength and mobility    Baseline 9/11- 35 seconds using UEs on thighs, limited by knee pain B    Time 3   Period Weeks   Status Partially Met     PT SHORT TERM GOAL #2   Title Patient to be able to verbalize 5/5 ways to reduce fall risk in order to demonstrate improved general safety    Baseline 9/11- 4/5    Time 3   Period Weeks   Status Partially Met     PT SHORT TERM GOAL #3   Title Patient to be consistent with regular progressive walking program, at least 5 days per week, in order to combat deleterious effects of chronic deconditioning and sedentary lifestyle    Baseline 9/11- walks about 10 minutes in/around her house, 7x/week 3x/day    Time 3   Period Weeks   Status Achieved     PT SHORT TERM GOAL #4   Title Paitent to be compliant with correct performance of appropriate HEP, to be updated PRN    Baseline 9/11- compliant, not as challenging however    Time 1   Period Weeks   Status Achieved           PT Long Term Goals - 05/21/17 0935      PT LONG TERM GOAL #1   Title Patient to demonstrate improvement of MMT by at least one grade in all tested groups in order to improve gait and balance    Baseline 9/10- generally much improved, some exercises limited by knee pain    Time 6   Period Weeks   Status Partially Met     PT LONG TERM GOAL #2   Title Patient to be able to complete TUG in 15 seconds with LRAD in order to show improved mobility and functional balance    Baseline 9/11- 25 seconds limited by knee pain    Time 6   Period Weeks   Status On-going     PT LONG TERM GOAL #3   Title Patient to be able to maintain tandem stance for at least 30 seconds on solid surface in order to show improved balance skills  Baseline 2023-06-18- 10-12 seconds    Time 6   Period Weeks   Status On-going     PT  LONG TERM GOAL #4   Title Patient to be participatory in regular exercise program, at least 3 days per week,  with L knee pain no more than 2/10 in order to maintain functional gains and prevent recurrence of condition    Baseline 2023-06-18- discussed today, gave resources for [facility] and local senior center    Time 6   Period Weeks   Status On-going               Plan - June 17, 2017 1158    Clinical Impression Statement Re-assessment performed today. Patient has made good objective progress at this point and is very satisfied with her current level of function, she believes her remaining impairments are limited by her joint and knee pain which has been consistent with her tolerance to activities during skilled PT treatment sessions. She has made good objective progress with skilled PT services at this point and is able to do everything she needs and wants safely despite ongoing deficits in strength and balance. Recommend 1 more skilled session before DC to advanced HEP/regular exercise program.    Rehab Potential Good   Clinical Impairments Affecting Rehab Potential (+) motivated to participate; (-) sedentary lifestyle, chronic deconditioning    PT Frequency Other (comment)  one more visit    PT Duration Other (comment)  one mroe visit    PT Treatment/Interventions ADLs/Self Care Home Management;DME Instruction;Gait training;Stair training;Functional mobility training;Therapeutic activities;Therapeutic exercise;Balance training;Neuromuscular re-education;Patient/family education;Orthotic Fit/Training;Manual techniques;Energy conservation;Taping   PT Next Visit Plan one more visit for HEP advancement, then DC. Caution with SLS based activiteis, repetitive activities due to knee pain.    PT Home Exercise Plan Eval: bridges, supine clams with red TB, tandem stance, walking program    Consulted and Agree with Plan of Care Patient      Patient will benefit from skilled therapeutic intervention in  order to improve the following deficits and impairments:  Abnormal gait, Improper body mechanics, Pain, Decreased coordination, Decreased mobility, Postural dysfunction, Decreased activity tolerance, Decreased strength, Decreased balance, Decreased safety awareness, Difficulty walking  Visit Diagnosis: Muscle weakness (generalized) - Plan: PT plan of care cert/re-cert  Unsteadiness on feet - Plan: PT plan of care cert/re-cert  Difficulty in walking, not elsewhere classified - Plan: PT plan of care cert/re-cert       G-Codes - 17-Jun-2017 1159    Functional Assessment Tool Used (Outpatient Only) Based on skilled clinical assessment of strength, balance, gait, fall risk    Functional Limitation Mobility: Walking and moving around   Mobility: Walking and Moving Around Current Status ([id number]) At least 40 percent but less than 60 percent impaired, limited or restricted   Mobility: Walking and Moving Around Goal Status [phone]) At least 40 percent but less than 60 percent impaired, limited or restricted      Problem List Patient Active Problem List   Diagnosis Date Noted  . History of uterine cancer 04/01/2017  . Closed right hip fracture ([place]) 04/09/2016  . Hip fracture ([place]) 04/09/2016  . 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  . Endometrial cancer, grade I ([place]) 03/21/2015  . Well woman exam with routine gynecological exam 03/15/2014  . Screening for malignant neoplasm of the cervix 03/15/2014  . Hx of cancer of endometrium 03/06/2013    Deniece Ree PT, DPT [place] [address] [place], [place], [address]  Phone: [phone]   Fax:  [phone]  Name: Brittany Mahoney MRN: [id number] Date of Birth: 09-08-1935

## 2017-05-23 ENCOUNTER — Ambulatory Visit (HOSPITAL_COMMUNITY): Payer: Medicare Other

## 2017-05-28 ENCOUNTER — Encounter (HOSPITAL_COMMUNITY): Payer: Medicare Other

## 2017-05-30 ENCOUNTER — Encounter (HOSPITAL_COMMUNITY): Payer: Medicare Other | Admitting: Physical Therapy

## 2017-05-30 ENCOUNTER — Ambulatory Visit (HOSPITAL_COMMUNITY): Payer: Medicare Other | Admitting: Physical Therapy

## 2017-05-30 ENCOUNTER — Encounter (HOSPITAL_COMMUNITY): Payer: Self-pay | Admitting: Physical Therapy

## 2017-05-30 DIAGNOSIS — M6281 Muscle weakness (generalized): Secondary | ICD-10-CM

## 2017-05-30 DIAGNOSIS — R262 Difficulty in walking, not elsewhere classified: Secondary | ICD-10-CM | POA: Diagnosis not present

## 2017-05-30 DIAGNOSIS — R2681 Unsteadiness on feet: Secondary | ICD-10-CM | POA: Diagnosis not present

## 2017-05-30 NOTE — Therapy (Signed)
[place], [place], [address] Phone: [phone]   Fax:  [phone]  Physical Therapy Treatment (Discharge)  Patient Details  Name: Brittany Mahoney MRN: [id number] Date of Birth: 1935-04-12 Referring Provider: Sinda Du   Encounter Date: 05/30/2017   PHYSICAL THERAPY DISCHARGE SUMMARY  Visits from Start of Care: 14  Current functional level related to goals / functional outcomes: Patient is able to do what she needs and wants, and is happy with her current functional status. DC today.    Remaining deficits: Functional muscle weakness, reduced functional activity tolerance, postural impairment, unsteadiness, difficulty walking, fall risk    Education / Equipment: See note below  Plan: Patient agrees to discharge.  Patient goals were partially met. Patient is being discharged due to being pleased with the current functional level.  ?????           PT End of Session - 05/30/17 1743    Visit Number 14   Number of Visits 14   Date for PT Re-Evaluation 06/05/17   Authorization Type Medicare/BCBS  (G-codes done 13th session)   Authorization Time Period 05/18/32 to 05/04/04; recert done 3/97   Authorization - Visit Number 14   Authorization - Number of Visits 24   PT Start Time 0819   PT Stop Time 0858   PT Time Calculation (min) 39 min   Activity Tolerance Patient tolerated treatment well   Behavior During Therapy [facility] for tasks assessed/performed      Past Medical History:  Diagnosis Date  . Broken hip ([place])   . Cancer ([place])    uterus  . Headache(784.0)    migraines after periods  . Hepatitis   . PMB (postmenopausal bleeding)     Past Surgical History:  Procedure Laterality Date  . ABDOMINAL HYSTERECTOMY     TAH and BSO  . DILATION AND CURETTAGE OF UTERUS    . INTRAMEDULLARY (IM) NAIL INTERTROCHANTERIC Right 04/10/2016   Procedure: INTRAMEDULLARY (IM) NAIL RIGHT HIP FRACTURE;  Surgeon:  Mcarthur Rossetti, MD;  Location: [facility];  Service: Orthopedics;  Laterality: Right;    There were no vitals filed for this visit.      Subjective Assessment - 05/30/17 6734    Subjective Patient arrives today stating she is doing well, she continues to be comfortable with making today her last today. She is debating between the [facility] in [place].    Pertinent History hx R hip fracture with IM nail placement 8/17; hx uterine cancer (resolved)   Patient Stated Goals get stronger, take care of L knee, improve balance    Currently in Pain? No/denies                         [facility] Adult PT Treatment/Exercise - 05/30/17 0001      Knee/Hip Exercises: Supine   Single Leg Bridge Both;1 set;20 reps   Straight Leg Raises Both;1 set;20 reps   Straight Leg Raises Limitations eccentric lower      Knee/Hip Exercises: Sidelying   Hip ABduction Both;1 set;15 reps   Hip ABduction Limitations cues for form      Knee/Hip Exercises: Prone   Hip Extension Both;1 set;10 reps   Hip Extension Limitations fatigue noted              Balance Exercises - 05/30/17 0842      Balance Exercises: Standing   Standing Eyes Closed Narrow base of support (  BOS);Solid surface;2 reps;20 secs   Tandem Stance Eyes open;3 reps;15 secs   Tandem Gait Forward;3 reps;Intermittent upper extremity support  in // bars            PT Education - 05/30/17 0858    Education provided Yes   Education Details advanced HEP, exercise/activity recommendations moving forward; DC today, exercise/equipment recommendations at [facility]) Educated Patient   Methods Explanation;Handout   Comprehension Verbalized understanding;Returned demonstration;Need further instruction          PT Short Term Goals - 05/21/17 0930      PT SHORT TERM GOAL #1   Title Patient to be able to complete 5x sit to stand in 20 seconds with UEs in order to demonstrate improved functional strength and  mobility    Baseline 9/11- 35 seconds using UEs on thighs, limited by knee pain B    Time 3   Period Weeks   Status Partially Met     PT SHORT TERM GOAL #2   Title Patient to be able to verbalize 5/5 ways to reduce fall risk in order to demonstrate improved general safety    Baseline 9/11- 4/5    Time 3   Period Weeks   Status Partially Met     PT SHORT TERM GOAL #3   Title Patient to be consistent with regular progressive walking program, at least 5 days per week, in order to combat deleterious effects of chronic deconditioning and sedentary lifestyle    Baseline 9/11- walks about 10 minutes in/around her house, 7x/week 3x/day    Time 3   Period Weeks   Status Achieved     PT SHORT TERM GOAL #4   Title Paitent to be compliant with correct performance of appropriate HEP, to be updated PRN    Baseline 9/11- compliant, not as challenging however    Time 1   Period Weeks   Status Achieved           PT Long Term Goals - 05/21/17 0935      PT LONG TERM GOAL #1   Title Patient to demonstrate improvement of MMT by at least one grade in all tested groups in order to improve gait and balance    Baseline 9/10- generally much improved, some exercises limited by knee pain    Time 6   Period Weeks   Status Partially Met     PT LONG TERM GOAL #2   Title Patient to be able to complete TUG in 15 seconds with LRAD in order to show improved mobility and functional balance    Baseline 9/11- 25 seconds limited by knee pain    Time 6   Period Weeks   Status On-going     PT LONG TERM GOAL #3   Title Patient to be able to maintain tandem stance for at least 30 seconds on solid surface in order to show improved balance skills    Baseline 9/11- 10-12 seconds    Time 6   Period Weeks   Status On-going     PT LONG TERM GOAL #4   Title Patient to be participatory in regular exercise program, at least 3 days per week,  with L knee pain no more than 2/10 in order to maintain functional  gains and prevent recurrence of condition    Baseline 9/11- discussed today, gave resources for [facility] and local senior center    Time 6   Period Weeks   Status On-going  Plan - 15-Jun-2017 0174    Clinical Impression Statement Focused today's session on condensing and reviewing/practicing appropriate HEP today, with main areas addressed being progression of hip strength (aggressive weight bearing tasks limited due to significant knee joint pain in closed chain positions) as well as balance based activities. Discussed and encouraged patient to participate in exercise at [facility] or local senior center. Recommend DC to independent exercise program/HEP today.    Rehab Potential Good   Clinical Impairments Affecting Rehab Potential (+) motivated to participate; (-) sedentary lifestyle, chronic deconditioning    PT Treatment/Interventions ADLs/Self Care Home Management;DME Instruction;Gait training;Stair training;Functional mobility training;Therapeutic activities;Therapeutic exercise;Balance training;Neuromuscular re-education;Patient/family education;Orthotic Fit/Training;Manual techniques;Energy conservation;Taping   PT Next Visit Plan DC today    PT Home Exercise Plan Eval: bridges, supine clams with red TB, tandem stance, walking program 16-Jun-2023: single leg bridges, SLRs, sidelying hip ABD, prone hip extension, tandem stance, narrow BOS with eyes closed, tandem gait, [employer]/senior center    Consulted and Agree with Plan of Care Patient      Patient will benefit from skilled therapeutic intervention in order to improve the following deficits and impairments:  Abnormal gait, Improper body mechanics, Pain, Decreased coordination, Decreased mobility, Postural dysfunction, Decreased activity tolerance, Decreased strength, Decreased balance, Decreased safety awareness, Difficulty walking  Visit Diagnosis: Muscle weakness (generalized)  Unsteadiness on feet  Difficulty in walking, not  elsewhere classified       G-Codes - Jun 15, 2017 1746    Functional Assessment Tool Used (Outpatient Only) Based on skilled clinical assessment of strength, balance, gait, fall risk    Functional Limitation Mobility: Walking and moving around   Mobility: Walking and Moving Around Goal Status [phone]) At least 40 percent but less than 60 percent impaired, limited or restricted   Mobility: Walking and Moving Around Discharge Status [phone]) At least 40 percent but less than 60 percent impaired, limited or restricted      Problem List Patient Active Problem List   Diagnosis Date Noted  . History of uterine cancer 04/01/2017  . Closed right hip fracture ([place]) 04/09/2016  . Hip fracture ([place]) 04/09/2016  . 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  . Endometrial cancer, grade I ([place]) 03/21/2015  . Well woman exam with routine gynecological exam 03/15/2014  . Screening for malignant neoplasm of the cervix 03/15/2014  . Hx of cancer of endometrium 03/06/2013    Deniece Ree PT, DPT [place] [address] [place], [place], [address] Phone: [phone]   Fax:  [phone]  Name: Brittany Mahoney MRN: [id number] Date of Birth: [birth year]

## 2017-05-30 NOTE — Patient Instructions (Addendum)
   SINGLE LEG BRIDGE  While lying on your back with your knees bent, extend one knee as shown.   Next, raise your buttocks off the floor/bed.    Try and maintain your pelvis level the entire time.  Repeat 15-20 times each side, 1-2 times per day.     STRAIGHT LEG RAISE - SLR  While lying on your back, raise up your leg with a straight knee.  Keep the opposite knee bent with the foot planted on the ground.  When you lower your leg, DO NOT just let it flop- control it as you slowly lower it all the way down to the table.  Repeat 15-20 times each side, 1-2 times per day.     SLR abduction   Lie down on your side. Bend your bottom leg while keeping the top leg straight. Your top leg should be in line with your torso. Raise that leg up off the table and back down. Make sure that you don't lead the lift with your toe, but keep the outside of your foot flat and parallel with the ceiling. You should feel the muscle in your outer hip working.  Do not let your hips roll backwards during this exercise. Also, think about leading with your heel rather than your toes- your toes should NOT face the ceiling during this exercise.   Repeat 10-15 times each side, 1-2 times per day.     PRONE HIP EXTENSION  While lying face down with your knee straight, slowly raise up leg off the ground. Maintain a straight knee the entire time.  Do not lift your leg so high that you feel your back muscles working- it is really a very small motion, just enough to clear your thigh from the table.  Repeat 10-15 times each side, 1-2 times per day.     TANDEM STANCE BALANCE (AT KITCHEN COUNTER FOR SAFETY)  Stand and balnace in tandem stance.   Hold for as long as you can before you need to touch the counter, then switch your feet. You are not walking during this exercise.   Repeat 2-3 times each side, 1-2 times per day.    TANDEM WALK (DO AT KITCHEN COUNTER OR WITH SOMEONE WITH YOU FOR SAFETY)  Take  steps so that your heel strikes the ground and is touching the toes of the other foot.  Continue taking steps as you walk forward.   Maintain your balance.   Repeat 3 full laps of the length of your kitchen counter or hallway in your home, 1-2 times per day.     Narrow Base of Support, Ground (HAVE SOMEONE NEAR BY FOR SAFETY)  Stand with arms crossed and bring feet together so toes and heels are touching. Face forward, standing up as tall as you can, and close your eyes.  Hold for a slow count of 20 seconds and then open your eyes.   Repeat 3 times, 1-2 times per day.

## 2017-06-04 ENCOUNTER — Encounter (HOSPITAL_COMMUNITY): Payer: Medicare Other | Admitting: Physical Therapy

## 2017-06-06 ENCOUNTER — Encounter (HOSPITAL_COMMUNITY): Payer: Medicare Other

## 2017-07-02 DIAGNOSIS — Z23 Encounter for immunization: Secondary | ICD-10-CM | POA: Diagnosis not present

## 2017-07-17 DIAGNOSIS — Z8781 Personal history of (healed) traumatic fracture: Secondary | ICD-10-CM | POA: Diagnosis not present

## 2017-07-17 DIAGNOSIS — M810 Age-related osteoporosis without current pathological fracture: Secondary | ICD-10-CM | POA: Diagnosis not present

## 2017-07-17 DIAGNOSIS — R413 Other amnesia: Secondary | ICD-10-CM | POA: Diagnosis not present

## 2017-07-17 DIAGNOSIS — M129 Arthropathy, unspecified: Secondary | ICD-10-CM | POA: Diagnosis not present

## 2017-07-17 LAB — VITAMIN B12: Vitamin B-12: 598

## 2017-07-22 DIAGNOSIS — M25562 Pain in left knee: Secondary | ICD-10-CM | POA: Diagnosis not present

## 2017-07-22 DIAGNOSIS — Z8781 Personal history of (healed) traumatic fracture: Secondary | ICD-10-CM | POA: Diagnosis not present

## 2017-07-22 DIAGNOSIS — M25511 Pain in right shoulder: Secondary | ICD-10-CM | POA: Diagnosis not present

## 2017-07-22 DIAGNOSIS — M810 Age-related osteoporosis without current pathological fracture: Secondary | ICD-10-CM | POA: Diagnosis not present

## 2017-12-02 IMAGING — MG 2D DIGITAL SCREENING BILATERAL MAMMOGRAM WITH CAD AND ADJUNCT TO
8 series · 8 of 24 positions shown · non-contrast
Comparison: Previous exam(s).

CLINICAL DATA: Screening.

EXAM:
2D DIGITAL SCREENING BILATERAL MAMMOGRAM WITH CAD AND ADJUNCT TOMO

[R MLO]
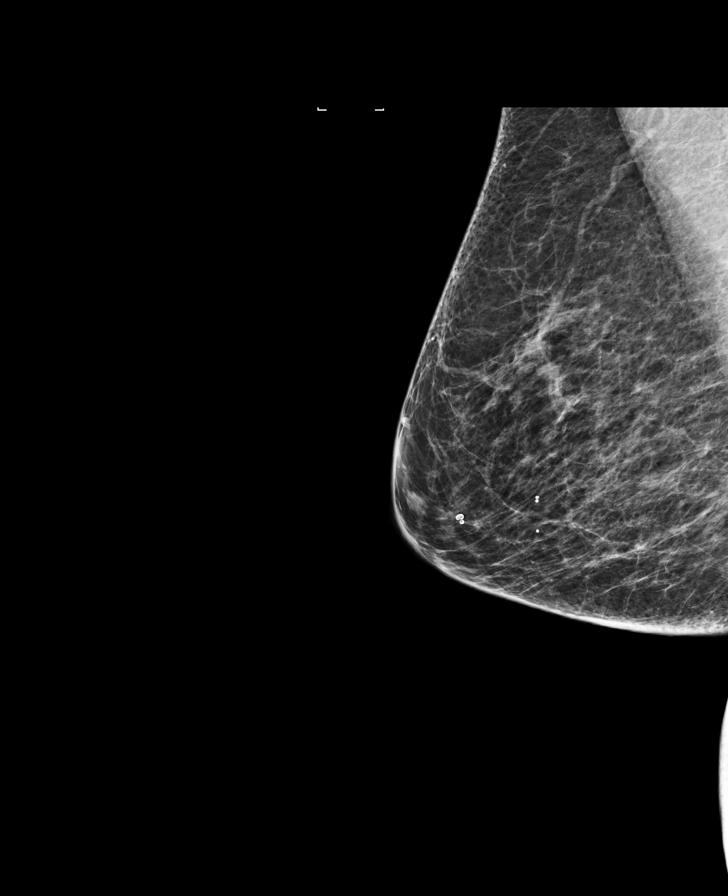

[R CC]
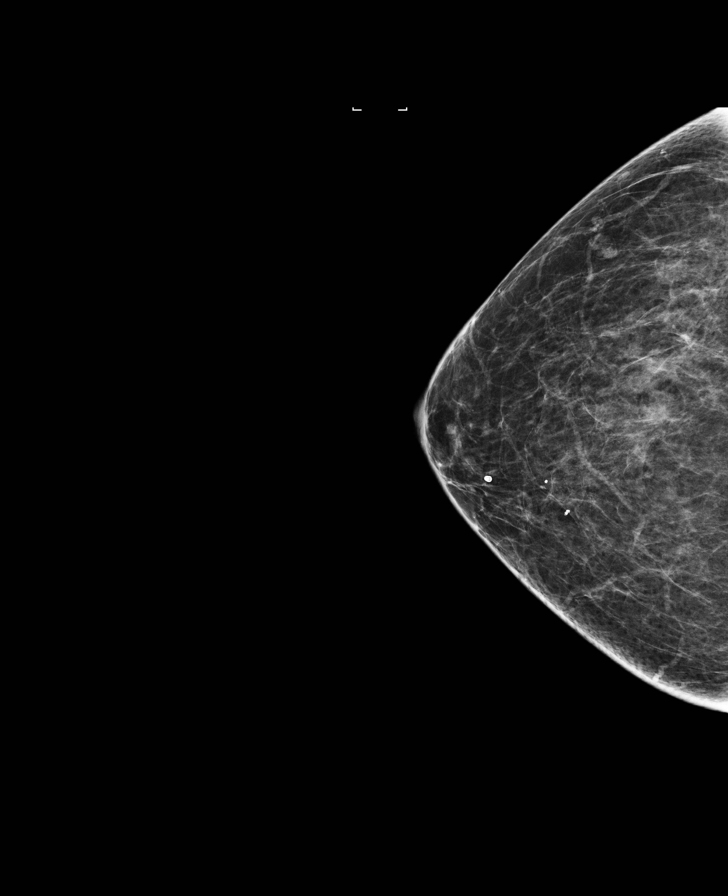

[L CC]
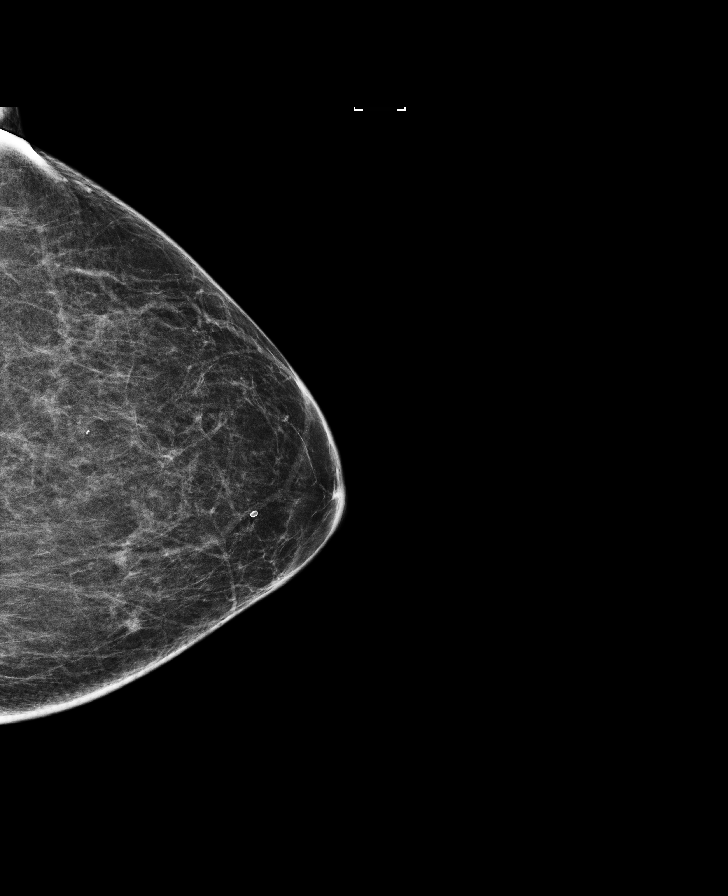

[L MLO]
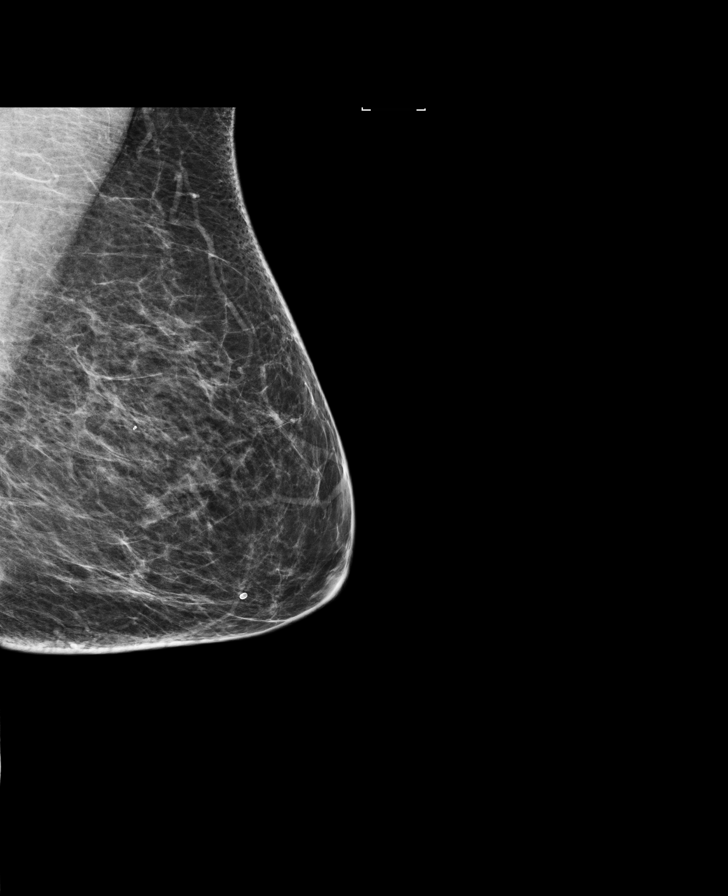

[R MLO tomo · tomo slice 33/64.0]
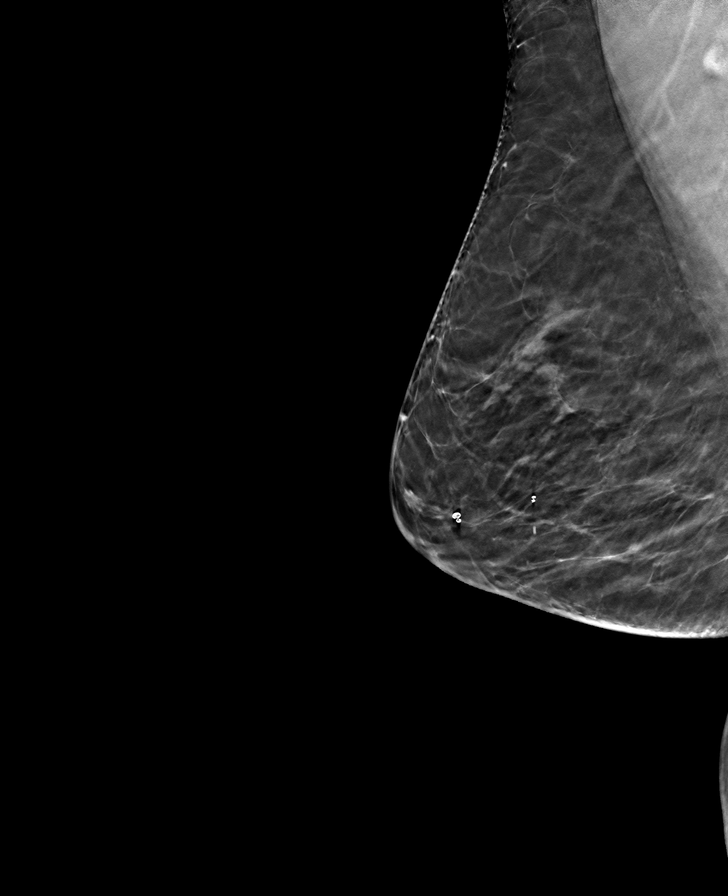

[L MLO tomo · tomo slice 35/69.0]
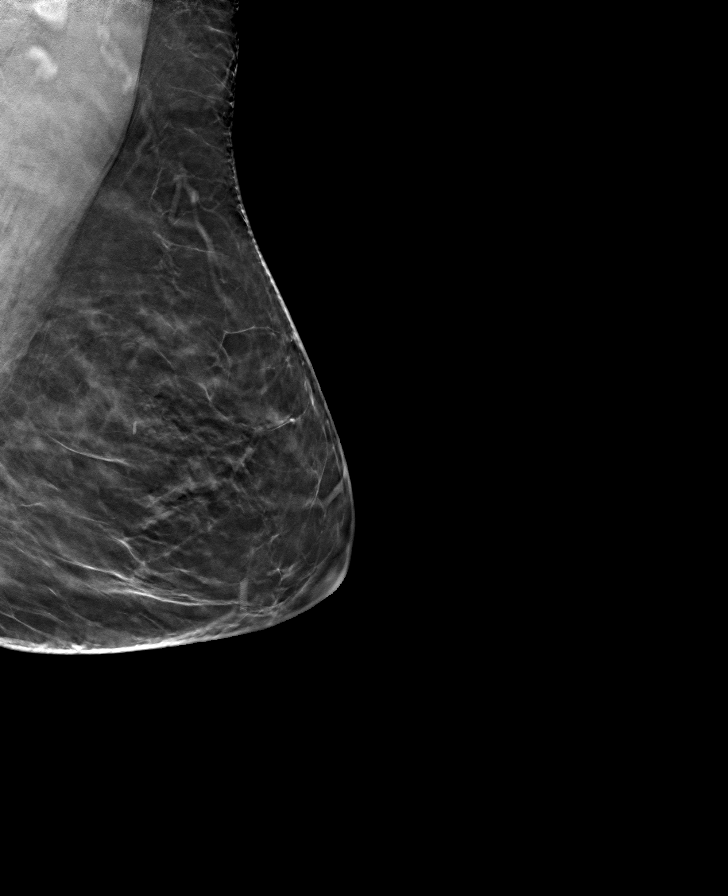

[L CC tomo · tomo slice 33/66.0]
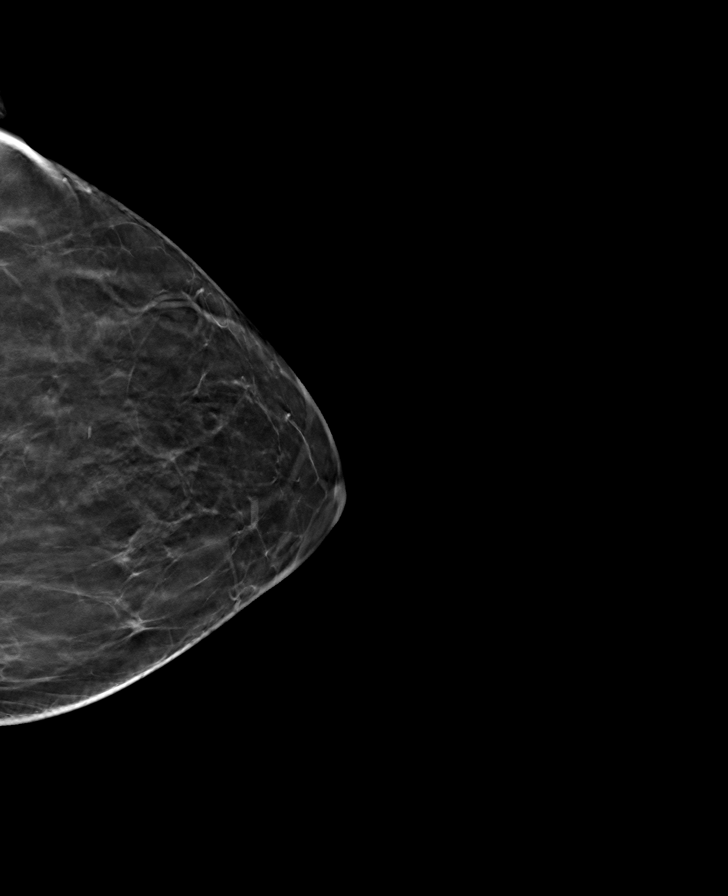

[R CC tomo · tomo slice 30/59.0]
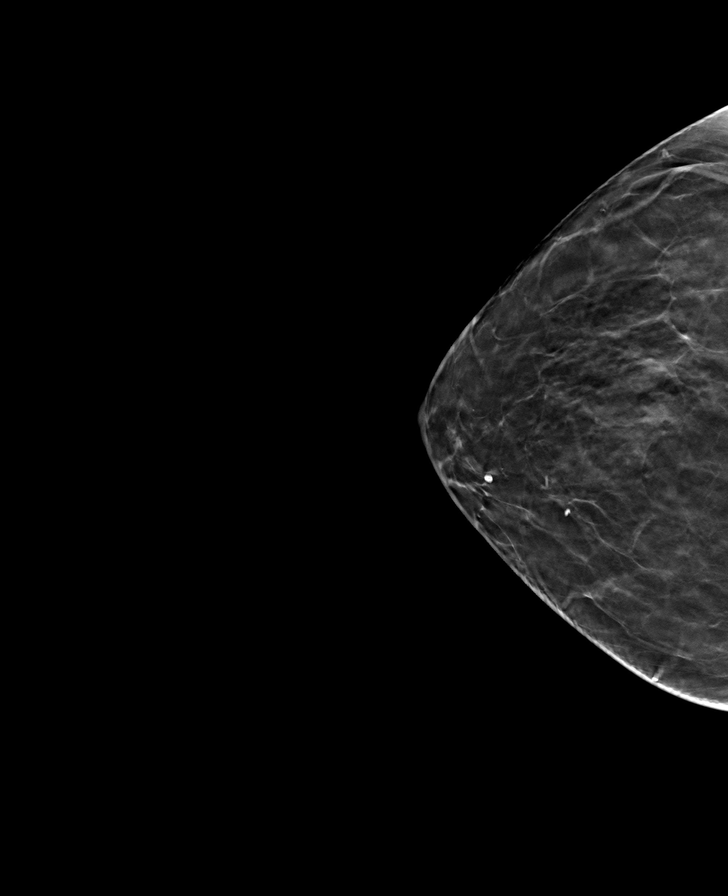

[8 of 24 positions shown; findings below may reference images not displayed]

ACR Breast Density Category b: There are scattered areas of
fibroglandular density.
FINDINGS: There are no findings suspicious for malignancy. Images were
processed with CAD.
IMPRESSION: No mammographic evidence of malignancy. A result letter of this
screening mammogram will be mailed directly to the patient.

RECOMMENDATION:
Screening mammogram in one year. (Code:97-6-RS4)

BI-RADS CATEGORY  1: Negative.

## 2018-01-17 ENCOUNTER — Encounter: Payer: Self-pay | Admitting: Family Medicine

## 2018-01-17 DIAGNOSIS — M129 Arthropathy, unspecified: Secondary | ICD-10-CM | POA: Diagnosis not present

## 2018-01-17 DIAGNOSIS — R413 Other amnesia: Secondary | ICD-10-CM | POA: Diagnosis not present

## 2018-01-17 DIAGNOSIS — Z8781 Personal history of (healed) traumatic fracture: Secondary | ICD-10-CM | POA: Diagnosis not present

## 2018-01-17 DIAGNOSIS — M810 Age-related osteoporosis without current pathological fracture: Secondary | ICD-10-CM | POA: Diagnosis not present

## 2018-01-17 DIAGNOSIS — N181 Chronic kidney disease, stage 1: Secondary | ICD-10-CM | POA: Diagnosis not present

## 2018-01-21 DIAGNOSIS — Z8781 Personal history of (healed) traumatic fracture: Secondary | ICD-10-CM | POA: Diagnosis not present

## 2018-01-21 DIAGNOSIS — M79671 Pain in right foot: Secondary | ICD-10-CM | POA: Diagnosis not present

## 2018-01-21 DIAGNOSIS — R251 Tremor, unspecified: Secondary | ICD-10-CM | POA: Diagnosis not present

## 2018-01-21 DIAGNOSIS — M810 Age-related osteoporosis without current pathological fracture: Secondary | ICD-10-CM | POA: Diagnosis not present

## 2018-02-10 ENCOUNTER — Other Ambulatory Visit: Payer: Self-pay | Admitting: Obstetrics and Gynecology

## 2018-02-10 DIAGNOSIS — Z1231 Encounter for screening mammogram for malignant neoplasm of breast: Secondary | ICD-10-CM

## 2018-02-20 DIAGNOSIS — H43813 Vitreous degeneration, bilateral: Secondary | ICD-10-CM | POA: Diagnosis not present

## 2018-02-20 DIAGNOSIS — Z961 Presence of intraocular lens: Secondary | ICD-10-CM | POA: Diagnosis not present

## 2018-02-20 DIAGNOSIS — Z9849 Cataract extraction status, unspecified eye: Secondary | ICD-10-CM | POA: Diagnosis not present

## 2018-02-20 DIAGNOSIS — H401132 Primary open-angle glaucoma, bilateral, moderate stage: Secondary | ICD-10-CM | POA: Diagnosis not present

## 2018-03-20 ENCOUNTER — Ambulatory Visit (HOSPITAL_COMMUNITY): Payer: Medicare Other

## 2018-03-20 ENCOUNTER — Encounter (HOSPITAL_COMMUNITY): Payer: Self-pay

## 2018-03-20 ENCOUNTER — Other Ambulatory Visit: Payer: Self-pay | Admitting: Obstetrics and Gynecology

## 2018-03-20 ENCOUNTER — Ambulatory Visit (HOSPITAL_COMMUNITY)
Admission: RE | Admit: 2018-03-20 | Discharge: 2018-03-20 | Disposition: A | Discharge status: Home / Self Care | Payer: Medicare Other | Source: Ambulatory Visit | Attending: Obstetrics and Gynecology | Admitting: Obstetrics and Gynecology

## 2018-03-20 DIAGNOSIS — Z1231 Encounter for screening mammogram for malignant neoplasm of breast: Secondary | ICD-10-CM | POA: Diagnosis not present

## 2018-03-21 ENCOUNTER — Telehealth: Payer: Self-pay | Admitting: *Deleted

## 2018-03-21 NOTE — Telephone Encounter (Signed)
Pt aware mammogram was normal. 1 year follow up recommended. Pt voiced understanding. [place]

## 2018-03-21 NOTE — Telephone Encounter (Signed)
-----   Message from Octaviano Glow, RN sent at 03/21/2018 10:18 AM EDT ----- Can you inform this patient please? ----- Message ----- From: Jonnie Kind, MD Sent: 03/20/2018  10:29 PM To: Octaviano Glow, RN  Normal mammogram.

## 2018-04-02 ENCOUNTER — Encounter: Payer: Self-pay | Admitting: Obstetrics and Gynecology

## 2018-04-02 ENCOUNTER — Ambulatory Visit (INDEPENDENT_AMBULATORY_CARE_PROVIDER_SITE_OTHER): Payer: Medicare Other | Admitting: Obstetrics and Gynecology

## 2018-04-02 ENCOUNTER — Other Ambulatory Visit (HOSPITAL_COMMUNITY)
Admission: RE | Admit: 2018-04-02 | Discharge: 2018-04-02 | Disposition: A | Discharge status: Home / Self Care | Payer: Medicare Other | Source: Ambulatory Visit | Attending: Obstetrics and Gynecology | Admitting: Obstetrics and Gynecology

## 2018-04-02 VITALS — Ht 65.0 in | Wt 167.0 lb

## 2018-04-02 DIAGNOSIS — Z01419 Encounter for gynecological examination (general) (routine) without abnormal findings: Secondary | ICD-10-CM

## 2018-04-02 DIAGNOSIS — N390 Urinary tract infection, site not specified: Secondary | ICD-10-CM

## 2018-04-02 DIAGNOSIS — Z124 Encounter for screening for malignant neoplasm of cervix: Secondary | ICD-10-CM

## 2018-04-02 DIAGNOSIS — Z8542 Personal history of malignant neoplasm of other parts of uterus: Secondary | ICD-10-CM

## 2018-04-02 DIAGNOSIS — R319 Hematuria, unspecified: Secondary | ICD-10-CM | POA: Diagnosis not present

## 2018-04-02 LAB — POCT URINALYSIS DIPSTICK
Glucose, UA: NEGATIVE
Ketones, UA: NEGATIVE
Leukocytes, UA: NEGATIVE
Nitrite, UA: POSITIVE
Protein, UA: NEGATIVE

## 2018-04-02 LAB — HM PAP SMEAR: HM Pap smear: NEGATIVE

## 2018-04-02 MED ORDER — SULFAMETHOXAZOLE-TRIMETHOPRIM 800-160 MG PO TABS: 1.0000 | ORAL_TABLET | Freq: Two times a day (BID) | ORAL | 0 refills | Status: DC

## 2018-04-02 NOTE — Addendum Note (Signed)
Addended by: Octaviano Glow on: 04/02/2018 10:50 AM   Modules accepted: Orders

## 2018-04-02 NOTE — Progress Notes (Signed)
Patient ID: Brittany Mahoney, female   DOB: [birth year], 83 y.o.   MRN: [id number]   Assessment:  Urinary infection Hx 2006 TaHBSo for endometrial cancer !A grade I with neg f/u x 13 yrs. Annual Gyn Exam Plan:  1. pap smear done, next pap due 1 year 2. Rx Septra for 5 days 3. return annually or prn 3    Annual mammogram advised after age 36  Subjective:  Brittany Mahoney is a 83 y.o. female G3P0010 who presents for annual exam. No LMP recorded. Patient has had a hysterectomy. Patient is here for Pap and Physical, she has a history of uterine cancer. She denies ay discharge. She twisted her left knee and fell broke her right hip and right femur 3 years ago. She had a pin put in her leg and hip just in case she fell again. She notes that her right side all good but her left knee gives her problems and surgery is not an option  The following portions of the patient's history were reviewed and updated as appropriate: allergies, current medications, past family history, past medical history, past social history, past surgical history and problem list. Past Medical History:  Diagnosis Date  . Broken hip ([place])   . Cancer ([place])    uterus  . Headache(784.0)    migraines after periods  . Hepatitis   . PMB (postmenopausal bleeding)     Past Surgical History:  Procedure Laterality Date  . ABDOMINAL HYSTERECTOMY     TAH and BSO  . DILATION AND CURETTAGE OF UTERUS    . INTRAMEDULLARY (IM) NAIL INTERTROCHANTERIC Right 04/10/2016   Procedure: INTRAMEDULLARY (IM) NAIL RIGHT HIP FRACTURE;  Surgeon: Mcarthur Rossetti, MD;  Location: [facility];  Service: Orthopedics;  Laterality: Right;     Current Outpatient Medications:  .  ALENDRONATE SODIUM PO, Take 70 mg by mouth every 7 (seven) days., Disp: , Rfl:  .  LUMIGAN 0.01 % SOLN, INSTILL 1 DROP IN INTO BOTH EYES AT BEDTIME, Disp: , Rfl: 6 .  conjugated estrogens (PREMARIN) vaginal cream, Place 1 Applicatorful vaginally 3 (three) times a week. For vaginal  thinning and irritation 0.5 gram= 1/4 applicator (Patient not taking: Reported on 04/02/2018), Disp: 42.5 g, Rfl: 2 .  ibuprofen (ADVIL,MOTRIN) 200 MG tablet, Take 400 mg by mouth at bedtime., Disp: , Rfl:   Review of Systems Constitutional: negative Gastrointestinal: negative Genitourinary: normal  Objective:  Ht 5\' 5"  (1.651 m)   Wt 167 lb (75.8 kg)   BMI 27.79 kg/m    BMI: Body mass index is 27.79 kg/m.  General Appearance: Alert, appropriate appearance for age. No acute distress HEENT: Grossly normal Neck / Thyroid:  Cardiovascular: RRR; normal S1, S2, no murmur Lungs: CTA bilaterally Back: No CVAT Breast Exam:  Normal Gastrointestinal: Soft, non-tender, no masses or organomegaly Pelvic Exam:  VULVA:  normal VAGINA: atrophic well healed CERVIX:  surgically absent,  UTERUS: absent  PAP: Pap smear done today. Lymphatic Exam: Non-palpable nodes in neck, clavicular, axillary, or inguinal regions  Skin: no rash or abnormalities Neurologic: Normal gait and speech, no tremor  Psychiatric: Alert and oriented, appropriate affect.  Urinalysis:Nitrates +, and blood 2 +  By signing my name below, I, Samul Dada, attest that this documentation has been prepared under the direction and in the presence of Jonnie Kind, MD. Electronically Signed: Gu [place]. 04/02/18. 10:09 AM.  I personally performed the services described in this documentation, which was SCRIBED in my presence. The  recorded information has been reviewed and considered accurate. It has been edited as necessary during review. Jonnie Kind, MD

## 2018-04-03 ENCOUNTER — Encounter: Payer: Self-pay | Admitting: Neurology

## 2018-04-03 ENCOUNTER — Ambulatory Visit (INDEPENDENT_AMBULATORY_CARE_PROVIDER_SITE_OTHER): Payer: Medicare Other | Admitting: Neurology

## 2018-04-03 VITALS — BP 147/82 | HR 84 | Ht 65.0 in | Wt 167.0 lb

## 2018-04-03 DIAGNOSIS — R251 Tremor, unspecified: Secondary | ICD-10-CM | POA: Diagnosis not present

## 2018-04-03 LAB — CYTOLOGY - PAP
Diagnosis: NEGATIVE
HPV: NOT DETECTED

## 2018-04-03 NOTE — Patient Instructions (Addendum)
You have a rather mild intermittent tremor of both hands.  I do not see any signs or symptoms of parkinson's like disease or what we call parkinsonism.   For your tremor, I would not recommend any new medication for fear of side effects (especially sleepiness) or medication interactions, especially in light of your balance issues.   Please remember, that any kind of tremor may be exacerbated by anxiety, anger, nervousness, excitement, dehydration, sleep deprivation, by caffeine, and low blood sugar values or blood sugar fluctuations.  I would like for you to follow up with your primary physician, I will see you back on an as-needed basis.

## 2018-04-03 NOTE — Progress Notes (Signed)
Subjective:    Patient ID: Brittany Mahoney is a 83 y.o. female.  HPI     Star Age, MD, PhD [facility] [address] [place], [place] [address]  Dear Dr. Luan Pulling,  I saw your patient, Brittany Mahoney, upon your kind request in my neurologic clinic today for initial consultation of her spells of shaking or trembling. The patient is accompanied by her daughter today. As you know, Brittany Mahoney is an 83 year old right-handed woman with an underlying medical history of hepatitis, uterine cancer, migraine headaches, and mildly overweight state, who reports a longer standing history of tremors in both hands for about 10 years as she recalls, per daughter, noticeable in the past 2-3 years. Is typically intermittent, does not always bother her, some days she has none and sometimes it's worse. She has not noticed much in the way of triggers. Her mother had a hand tremors far as she recalls. She is widowed, retired, husband passed away some 3 years ago. She lives with her daughter. Her son passed away at age 56 with end-stage renal disease and diabetes. She is a lifelong nonsmoker and does not typically utilize alcohol, drinks caffeine in limitation, 1 soda per day on average. She has balance issues, she has a lifelong issue with her right foot, ankle deformity when she was born. She fell and injured her right hip, needed surgery to the right hip in 2017. She tries to hydrated well with water. Typically, she sleeps fairly well. She lives in her single story home. She worked at [employer] and was Occupational hygienist for many years.  Her Past Medical History Is Significant For: Past Medical History:  Diagnosis Date  . Broken hip ([place])   . Cancer ([place])    uterus  . Headache(784.0)    migraines after periods  . Hepatitis   . PMB (postmenopausal bleeding)     Her Past Surgical History Is Significant For: Past Surgical History:  Procedure Laterality Date   . ABDOMINAL HYSTERECTOMY     TAH and BSO  . DILATION AND CURETTAGE OF UTERUS    . INTRAMEDULLARY (IM) NAIL INTERTROCHANTERIC Right 04/10/2016   Procedure: INTRAMEDULLARY (IM) NAIL RIGHT HIP FRACTURE;  Surgeon: Mcarthur Rossetti, MD;  Location: [facility];  Service: Orthopedics;  Laterality: Right;    Her Family History Is Significant For: Family History  Problem Relation Age of Onset  . Stroke Maternal Grandfather   . Stroke Paternal Grandfather   . Cancer Father        lymphatic sarcoma  . Early death Sister   . Early death Son   . Diabetes Son   . Heart disease Other   . Cancer Other   . Tuberculosis Other   . Diabetes Other   . Thyroid disease Other   . Stroke Other   . Heart disease Maternal Aunt   . Heart disease Maternal Uncle     Her Social History Is Significant For: Social History   Socioeconomic History  . Marital status: Widowed    Spouse name: Not on file  . Number of children: Not on file  . Years of education: Not on file  . Highest education level: Not on file  Occupational History  . Not on file  Social Needs  . Financial resource strain: Not on file  . Food insecurity:    Worry: Not on file    Inability: Not on file  . Transportation needs:  Medical: Not on file    Non-medical: Not on file  Tobacco Use  . Smoking status: Never Smoker  . Smokeless tobacco: Never Used  Substance and Sexual Activity  . Alcohol use: No  . Drug use: No  . Sexual activity: Not Currently    Birth control/protection: Surgical  Lifestyle  . Physical activity:    Days per week: Not on file    Minutes per session: Not on file  . Stress: Not on file  Relationships  . Social connections:    Talks on phone: Not on file    Gets together: Not on file    Attends religious service: Not on file    Active member of club or organization: Not on file    Attends meetings of clubs or organizations: Not on file    Relationship status: Not on file  Other Topics Concern  .  Not on file  Social History Narrative  . Not on file    Her Allergies Are:  Allergies  Allergen Reactions  . Codeine   . Morphine And Related     loopy  . Penicillins Hives and Swelling    Has patient had a PCN reaction causing immediate rash, facial/tongue/throat swelling, SOB or lightheadedness with hypotension: Yes Has patient had a PCN reaction causing severe rash involving mucus membranes or skin necrosis: No Has patient had a PCN reaction that required hospitalization No Has patient had a PCN reaction occurring within the last 10 years: No  If all of the above answers are "NO", then may proceed with Cephalosporin use.   :   Her Current Medications Are:  Outpatient Encounter Medications as of 04/03/2018  Medication Sig  . ALENDRONATE SODIUM PO Take 70 mg by mouth every 7 (seven) days.  Marland Kitchen conjugated estrogens (PREMARIN) vaginal cream Place 1 Applicatorful vaginally 3 (three) times a week. For vaginal thinning and irritation 0.5 gram= 1/4 applicator  . LUMIGAN 0.01 % SOLN INSTILL 1 DROP IN INTO BOTH EYES AT BEDTIME  . naproxen sodium (ALEVE) 220 MG tablet Take 220 mg by mouth daily as needed.  . sulfamethoxazole-trimethoprim (BACTRIM DS,SEPTRA DS) 800-160 MG tablet Take 1 tablet by mouth 2 (two) times daily.  . [DISCONTINUED] ibuprofen (ADVIL,MOTRIN) 200 MG tablet Take 400 mg by mouth at bedtime.   No facility-administered encounter medications on file as of 04/03/2018.   :   Review of Systems:  Out of a complete 14 point review of systems, all are reviewed and negative with the exception of these symptoms as listed below: Review of Systems  Neurological:       Pt presents today to discuss her tremors. Pt noticed bilateral hand tremors for years but the frequency of the tremors have increased. Pt is right handed.    Objective:  Neurological Exam  Physical Exam Physical Examination:   Vitals:   04/03/18 1427  BP: (!) 147/82  Pulse: 84    General Examination: The  patient is a very pleasant 83 y.o. female in no acute distress. She appears well-developed and well-nourished and well groomed.   HEENT: Normocephalic, atraumatic, pupils are equal, round and reactive to light and accommodation.  Corrective eyeglasses in place. Hearing is grossly intact. She has no lip, neck or jaw tremor. Neck is supple with full range of motion. She has no carotid bruits. Airway examination reveals benign findings, no oral facial dyskinesias, tongue protrudes centrally and palate elevates symmetrically, no hypophonia or dysarthria noted.   Chest: Clear to auscultation without wheezing,  rhonchi or crackles noted.  Heart: S1+S2+0, regular and normal without murmurs, rubs or gallops noted.   Abdomen: Soft, non-tender and non-distended with normal bowel sounds appreciated on auscultation.  Extremities: There is trace pitting edema in the distal lower extremities bilaterally.  Skin: Warm and dry without trophic changes noted. She has prominent spider veins in both distal lower extremities, particularly around the ankles.   Musculoskeletal: exam reveals prominent arthritic changes in both hands, right more than left, bilateral knee swelling with left knee tenderness noted, and even hip height with left leg appearing longer than the right. She has a right ankle deformity.   Neurologically:  Mental status: The patient is awake, alert and oriented in all 4 spheres. Her immediate and remote memory, attention, language skills and fund of knowledge are appropriate. There is no evidence of aphasia, agnosia, apraxia or anomia. Speech is clear with normal prosody and enunciation. Thought process is linear. Mood is normal and affect is normal.  Cranial nerves II - XII are as described above under HEENT exam. In addition: shoulder shrug is normal with equal shoulder height noted. Motor exam: Normal bulk, strength and tone is noted. Romberg is not tested, due to safety concerns. Reflexes are 1+  in the upper extremities and absent in the lower extremities. Fine motor skills are globally mildly impaired.   On 04/03/2018: On Archimedes spiral drawing she has minimal insecurity with the right hand which is her dominant hand, she has minimal tremulousness with the left hand. Handwriting is mildly tremulous, legible, not micrographic. She has a very mild bilateral upper extremity postural and action tremor, no resting tremor. Cerebellar testing: No dysmetria or intention tremor on finger to nose testing. Heel-to-shin is not possible for her.  Sensory exam: intact to light touch in the upper and lower extremities.  Gait, station and balance: She stands with difficulty and pushes herself up. Her left leg appears longer than the right, unequal hip height. Right foot points outwards and she walks slowly and cautiously, complains of left knee discomfort when walking. She has preserved arm swing and is able to walk some without her 4 point cane.  Assessment and Plan:   In summary, Brittany Mahoney is a very pleasant 83 y.o.-year old female with an underlying medical history of hepatitis, uterine cancer, migraine headaches, and mildly overweight state, who presents for evaluation of her hand tremors. Her history, family history and physical exam are in keeping with essential tremor area findings and the mild side. I do not see any telltale signs of parkinsonism or Parkinson's disease. The patient is reassured in that regard. I would not recommend any symptomatic medication for tremor control for fear of side effects in particular, she reports issues with her balance. She has most likely orthopedic reasons for balance concerns and fall risk. We did talk about the importance of fall prevention. She is encouraged to hydrate well with water. We talked about tremor triggers including sleep deprivation, dehydration, blood sugar fluctuations, stress etc. I did not suggest any new medications or test from my end of  things, I would be happy to see her back on an as-needed basis. I answered all their questions today and the patient and her daughter were in agreement. Thank you very much for allowing me to participate in the care of this nice patient. If I can be of any further assistance to you please do not hesitate to call me at [phone].  Sincerely,   Star Age, MD, PhD

## 2018-04-04 LAB — URINE CULTURE

## 2018-04-11 ENCOUNTER — Other Ambulatory Visit: Payer: Self-pay

## 2018-05-15 ENCOUNTER — Encounter: Payer: Medicare Other | Admitting: Neurology

## 2018-06-16 DIAGNOSIS — M1712 Unilateral primary osteoarthritis, left knee: Secondary | ICD-10-CM | POA: Diagnosis not present

## 2018-06-16 DIAGNOSIS — M25562 Pain in left knee: Secondary | ICD-10-CM | POA: Diagnosis not present

## 2018-06-25 DIAGNOSIS — M25562 Pain in left knee: Secondary | ICD-10-CM | POA: Diagnosis not present

## 2018-06-25 DIAGNOSIS — M1712 Unilateral primary osteoarthritis, left knee: Secondary | ICD-10-CM | POA: Diagnosis not present

## 2018-07-02 DIAGNOSIS — M1712 Unilateral primary osteoarthritis, left knee: Secondary | ICD-10-CM | POA: Diagnosis not present

## 2018-07-02 DIAGNOSIS — M25562 Pain in left knee: Secondary | ICD-10-CM | POA: Diagnosis not present

## 2018-07-09 DIAGNOSIS — M25562 Pain in left knee: Secondary | ICD-10-CM | POA: Diagnosis not present

## 2018-07-09 DIAGNOSIS — M1712 Unilateral primary osteoarthritis, left knee: Secondary | ICD-10-CM | POA: Diagnosis not present

## 2018-07-15 DIAGNOSIS — M25562 Pain in left knee: Secondary | ICD-10-CM | POA: Diagnosis not present

## 2018-07-15 DIAGNOSIS — M1712 Unilateral primary osteoarthritis, left knee: Secondary | ICD-10-CM | POA: Diagnosis not present

## 2018-07-19 DIAGNOSIS — Z23 Encounter for immunization: Secondary | ICD-10-CM | POA: Diagnosis not present

## 2018-07-24 DIAGNOSIS — M25552 Pain in left hip: Secondary | ICD-10-CM | POA: Diagnosis not present

## 2018-07-24 DIAGNOSIS — M545 Low back pain: Secondary | ICD-10-CM | POA: Diagnosis not present

## 2018-07-24 DIAGNOSIS — M25562 Pain in left knee: Secondary | ICD-10-CM | POA: Diagnosis not present

## 2018-07-24 DIAGNOSIS — R296 Repeated falls: Secondary | ICD-10-CM | POA: Diagnosis not present

## 2018-08-01 ENCOUNTER — Other Ambulatory Visit (HOSPITAL_COMMUNITY): Payer: Self-pay | Admitting: Pulmonary Disease

## 2018-08-01 ENCOUNTER — Ambulatory Visit (HOSPITAL_COMMUNITY)
Admission: RE | Admit: 2018-08-01 | Discharge: 2018-08-01 | Disposition: A | Discharge status: Home / Self Care | Payer: Medicare Other | Source: Ambulatory Visit | Attending: Pulmonary Disease | Admitting: Pulmonary Disease

## 2018-08-01 DIAGNOSIS — M545 Low back pain, unspecified: Secondary | ICD-10-CM

## 2018-08-01 DIAGNOSIS — M25552 Pain in left hip: Secondary | ICD-10-CM | POA: Diagnosis not present

## 2018-08-01 DIAGNOSIS — M4726 Other spondylosis with radiculopathy, lumbar region: Secondary | ICD-10-CM | POA: Diagnosis not present

## 2018-08-01 DIAGNOSIS — M1612 Unilateral primary osteoarthritis, left hip: Secondary | ICD-10-CM | POA: Diagnosis not present

## 2018-08-06 ENCOUNTER — Ambulatory Visit (INDEPENDENT_AMBULATORY_CARE_PROVIDER_SITE_OTHER): Payer: Medicare Other | Admitting: Orthopaedic Surgery

## 2018-08-11 ENCOUNTER — Encounter (INDEPENDENT_AMBULATORY_CARE_PROVIDER_SITE_OTHER): Payer: Self-pay | Admitting: Orthopaedic Surgery

## 2018-08-11 ENCOUNTER — Ambulatory Visit (INDEPENDENT_AMBULATORY_CARE_PROVIDER_SITE_OTHER): Payer: Medicare Other | Admitting: Orthopaedic Surgery

## 2018-08-11 ENCOUNTER — Ambulatory Visit (INDEPENDENT_AMBULATORY_CARE_PROVIDER_SITE_OTHER): Payer: Medicare Other

## 2018-08-11 DIAGNOSIS — M25562 Pain in left knee: Secondary | ICD-10-CM

## 2018-08-11 DIAGNOSIS — M170 Bilateral primary osteoarthritis of knee: Secondary | ICD-10-CM | POA: Diagnosis not present

## 2018-08-11 DIAGNOSIS — G8929 Other chronic pain: Secondary | ICD-10-CM | POA: Diagnosis not present

## 2018-08-11 NOTE — Progress Notes (Signed)
Office Visit Note   Patient: Brittany Mahoney           Date of Birth: 10-07-1934           MRN: [id number] Visit Date: 08/11/2018              Requested by: Sinda Du, MD [address] [place], [place] [address] PCP: Sinda Du, MD   Assessment & Plan: Visit Diagnoses:  1. Chronic pain of left knee   2. Primary osteoarthritis of both knees     Plan: Mrs. Shadowens would like to avoid surgery if possible.  She did like to try cortisone injection left knee today and see how this does regards to her pain.  We will see her back in 2 weeks check her response to the injection may consider right knee injection.  She is also to pick up an over-the-counter knee sleeve as discussed with Dr. Ninfa Linden.  Quad strengthening exercises shown to the patient.  Questions were encouraged and answered by Dr. Ninfa Linden and myself ,both of the patient and her daughter is present throughout the exam today.  Follow-Up Instructions: Return in about 2 weeks (around 08/25/2018).   Orders:  Orders Placed This Encounter  Procedures  . XR Knee 1-2 Views Left   No orders of the defined types were placed in this encounter.     Procedures: No procedures performed   Clinical Data: No additional findings.   Subjective: Chief Complaint  Patient presents with  . Left Knee - Pain    HPI Brittany Mahoney is known to Dr. Ninfa Linden service comes in today due to left knee pain.  She is status post IM nailing of a right hip fracture 2017.  States she had bilateral knee pain for years.  She now ambulates with a walker because she is afraid she may fall again.  Feels still weak has balance issues since the right hip fracture.  [facility] was given injections in her knees which lasted for short period of time.  She is had no acute injury to either knee. Review of Systems See HPI  Objective: Vital Signs: There were no vitals taken for this visit.  Physical Exam  Constitutional:  She is oriented to person, place, and time. She appears well-developed and well-nourished. No distress.  Pulmonary/Chest: Effort normal.  Neurological: She is alert and oriented to person, place, and time.  Skin: She is not diaphoretic.  Psychiatric: She has a normal mood and affect.    Ortho Exam Right knee tenderness over the medial lateral joint line valgus deformity present.  No instability valgus varus stressing.  Left knee tenderness on medial joint line with varus deformity.  No instability valgus varus stressing.  She has full extension both knees flexion beyond 90 degrees both knees with significant patellofemoral crepitus bilateral knees. Specialty Comments:  No specialty comments available.  Imaging: Xr Knee 1-2 Views Left  Result Date: 08/11/2018 Left knee AP lateral views: Bone-on-bone medial compartment .Moderate patellar femoral changes.  Mild to moderate changes lateral compartment.  AP view right knee shows patient is status post IM nailing hip fracture with near bone-on-bone lateral compartment.  No acute fractures.     PMFS History: Patient Active Problem List   Diagnosis Date Noted  . Urinary tract infection with hematuria 04/02/2018  . History of uterine cancer 04/01/2017  . Closed right hip fracture ([place]) 04/09/2016  . Hip fracture ([place]) 04/09/2016  . 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  .  Endometrial cancer, grade I ([place]) 03/21/2015  . Well woman exam with routine gynecological exam 03/15/2014  . Screening for malignant neoplasm of the cervix 03/15/2014  . Hx of cancer of endometrium 03/06/2013   Past Medical History:  Diagnosis Date  . Broken hip ([place])   . Cancer ([place])    uterus  . Headache(784.0)    migraines after periods  . Hepatitis   . PMB (postmenopausal bleeding)     Family History  Problem Relation Age of Onset  . Stroke Maternal Grandfather   . Stroke Paternal Grandfather   . Cancer Father        lymphatic sarcoma  . Early death Sister   .  Early death Son   . Diabetes Son   . Heart disease Other   . Cancer Other   . Tuberculosis Other   . Diabetes Other   . Thyroid disease Other   . Stroke Other   . Heart disease Maternal Aunt   . Heart disease Maternal Uncle     Past Surgical History:  Procedure Laterality Date  . ABDOMINAL HYSTERECTOMY     TAH and BSO  . DILATION AND CURETTAGE OF UTERUS    . INTRAMEDULLARY (IM) NAIL INTERTROCHANTERIC Right 04/10/2016   Procedure: INTRAMEDULLARY (IM) NAIL RIGHT HIP FRACTURE;  Surgeon: Mcarthur Rossetti, MD;  Location: [facility];  Service: Orthopedics;  Laterality: Right;   Social History   Occupational History  . Not on file  Tobacco Use  . Smoking status: Never Smoker  . Smokeless tobacco: Never Used  Substance and Sexual Activity  . Alcohol use: No  . Drug use: No  . Sexual activity: Not Currently    Birth control/protection: Surgical

## 2018-08-25 ENCOUNTER — Ambulatory Visit (INDEPENDENT_AMBULATORY_CARE_PROVIDER_SITE_OTHER): Payer: Medicare Other | Admitting: Orthopaedic Surgery

## 2018-08-25 ENCOUNTER — Encounter (INDEPENDENT_AMBULATORY_CARE_PROVIDER_SITE_OTHER): Payer: Self-pay | Admitting: Orthopaedic Surgery

## 2018-08-25 DIAGNOSIS — M25561 Pain in right knee: Secondary | ICD-10-CM

## 2018-08-25 DIAGNOSIS — G8929 Other chronic pain: Secondary | ICD-10-CM

## 2018-08-25 DIAGNOSIS — M25562 Pain in left knee: Secondary | ICD-10-CM

## 2018-08-25 DIAGNOSIS — M170 Bilateral primary osteoarthritis of knee: Secondary | ICD-10-CM | POA: Diagnosis not present

## 2018-08-25 MED ORDER — METHYLPREDNISOLONE ACETATE 40 MG/ML IJ SUSP
40.0000 mg | INTRAMUSCULAR | Status: AC | PRN
  Administered 2018-08-25: 40 mg via INTRA_ARTICULAR

## 2018-08-25 MED ORDER — LIDOCAINE HCL 1 % IJ SOLN
3.0000 mL | INTRAMUSCULAR | Status: AC | PRN
  Administered 2018-08-25: 3 mL

## 2018-08-25 NOTE — Progress Notes (Signed)
Office Visit Note   Patient: Brittany Mahoney           Date of Birth: 08-10-1935           MRN: [id number] Visit Date: 08/25/2018              Requested by: Sinda Du, MD [address] [place], [place] [address] PCP: Sinda Du, MD   Assessment & Plan: Visit Diagnoses:  1. Chronic pain of left knee   2. Primary osteoarthritis of both knees   3. Chronic pain of right knee     Plan: I did place a steroid injection in her right knee today per her request.  I do feel that she is a candidate for outpatient physical therapy to work on strengthening her bilateral quad muscles and lower extremities they can hopefully help with her balance and coordination and decrease her risk of fall.  All question concerns were answered and addressed.  We can always inject her knees again in 3 months from now if needed.  Her daughter is with her today as well.  Follow-up will be as needed unless there is issues.  Follow-Up Instructions: Return if symptoms worsen or fail to improve.   Orders:  Orders Placed This Encounter  Procedures  . Large Joint Inj   No orders of the defined types were placed in this encounter.     Procedures: Large Joint Inj: R knee on 08/25/2018 10:24 AM Indications: diagnostic evaluation and pain Details: 22 G 1.5 in needle, superolateral approach  Arthrogram: No  Medications: 3 mL lidocaine 1 %; 40 mg methylPREDNISolone acetate 40 MG/ML Outcome: tolerated well, no immediate complications Procedure, treatment alternatives, risks and benefits explained, specific risks discussed. Consent was given by the patient. Immediately prior to procedure a time out was called to verify the correct patient, procedure, equipment, support staff and site/side marked as required. Patient was prepped and draped in the usual sterile fashion.       Clinical Data: No additional findings.   Subjective: Chief Complaint  Patient presents with  . Left Knee - Pain  The patient  is 83 year old well-known to [place].  She is a fall risk having bilateral knee pain.  She has had previous on a right hip fracture in 2017.  She mobilizes using a walker.  She had a steroid injection in her left knee back about 2 weeks ago anyone to see her back in the office today to consider injection in her right knee.  She is feeling better overall but is still a fall risk.  HPI  Review of Systems She currently denies any headache, chest pain, shortness of breath, fever, chills, nausea, vomiting.  Objective: Vital Signs: There were no vitals taken for this visit.  Physical Exam She is alert and oriented x3 and in no acute distress Ortho Exam Examination of both knees show chronic swelling from significant arthritis of both knees.  Both knees have good range of motion no acute findings. Specialty Comments:  No specialty comments available.  Imaging: No results found.   PMFS History: Patient Active Problem List   Diagnosis Date Noted  . Chronic pain of left knee 08/25/2018  . Chronic pain of right knee 08/25/2018  . Urinary tract infection with hematuria 04/02/2018  . History of uterine cancer 04/01/2017  . Closed right hip fracture ([facility]) 04/09/2016  . Hip fracture ([facility]) 04/09/2016  . 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  . Endometrial cancer, grade I ([facility]) 03/21/2015  . Well woman exam  with routine gynecological exam 03/15/2014  . Screening for malignant neoplasm of the cervix 03/15/2014  . Hx of cancer of endometrium 03/06/2013   Past Medical History:  Diagnosis Date  . Broken hip ([facility])   . Cancer ([place])    uterus  . Headache(784.0)    migraines after periods  . Hepatitis   . PMB (postmenopausal bleeding)     Family History  Problem Relation Age of Onset  . Stroke Maternal Grandfather   . Stroke Paternal Grandfather   . Cancer Father        lymphatic sarcoma  . Early death Sister   . Early death Son   . Diabetes Son   . Heart disease Other   . Cancer Other   .  Tuberculosis Other   . Diabetes Other   . Thyroid disease Other   . Stroke Other   . Heart disease Maternal Aunt   . Heart disease Maternal Uncle     Past Surgical History:  Procedure Laterality Date  . ABDOMINAL HYSTERECTOMY     TAH and BSO  . DILATION AND CURETTAGE OF UTERUS    . INTRAMEDULLARY (IM) NAIL INTERTROCHANTERIC Right 04/10/2016   Procedure: INTRAMEDULLARY (IM) NAIL RIGHT HIP FRACTURE;  Surgeon: Mcarthur Rossetti, MD;  Location: [facility];  Service: Orthopedics;  Laterality: Right;   Social History   Occupational History  . Not on file  Tobacco Use  . Smoking status: Never Smoker  . Smokeless tobacco: Never Used  Substance and Sexual Activity  . Alcohol use: No  . Drug use: No  . Sexual activity: Not Currently    Birth control/protection: Surgical

## 2018-08-27 ENCOUNTER — Other Ambulatory Visit (INDEPENDENT_AMBULATORY_CARE_PROVIDER_SITE_OTHER): Payer: Self-pay

## 2018-08-27 DIAGNOSIS — G8929 Other chronic pain: Secondary | ICD-10-CM

## 2018-08-27 DIAGNOSIS — M25562 Pain in left knee: Principal | ICD-10-CM

## 2018-09-16 ENCOUNTER — Encounter (HOSPITAL_COMMUNITY): Payer: Self-pay

## 2018-09-16 ENCOUNTER — Ambulatory Visit (HOSPITAL_COMMUNITY): Payer: Medicare Other | Attending: Orthopaedic Surgery

## 2018-09-16 ENCOUNTER — Other Ambulatory Visit: Payer: Self-pay

## 2018-09-16 DIAGNOSIS — R262 Difficulty in walking, not elsewhere classified: Secondary | ICD-10-CM

## 2018-09-16 DIAGNOSIS — M25562 Pain in left knee: Secondary | ICD-10-CM | POA: Diagnosis not present

## 2018-09-16 DIAGNOSIS — M6281 Muscle weakness (generalized): Secondary | ICD-10-CM | POA: Insufficient documentation

## 2018-09-16 DIAGNOSIS — G8929 Other chronic pain: Secondary | ICD-10-CM | POA: Diagnosis not present

## 2018-09-16 DIAGNOSIS — R2681 Unsteadiness on feet: Secondary | ICD-10-CM | POA: Diagnosis not present

## 2018-09-16 NOTE — Therapy (Signed)
[place], [place], [address] Phone: [phone]   Fax:  [phone]  Physical Therapy Evaluation  Patient Details  Name: Brittany Mahoney MRN: [id number] Date of Birth: 11-08-1934 Referring Provider (PT): Jean Rosenthal, MD   Encounter Date: 09/16/2018  PT End of Session - 09/16/18 1151    Visit Number  1    Number of Visits  13    Date for PT Re-Evaluation  10/28/18   mini reassess due 10/07/18   Authorization Type  Medicare (Secondary: BCBS Supplement)    Authorization Time Period  09/16/18 to 10/28/18    Authorization - Visit Number  1    Authorization - Number of Visits  10    PT Start Time  0816    PT Stop Time  0858    PT Time Calculation (min)  42 min    Activity Tolerance  Patient tolerated treatment well    Behavior During Therapy  [facility] for tasks assessed/performed       Past Medical History:  Diagnosis Date  . Broken hip ([place])   . Cancer ([place])    uterus  . Headache(784.0)    migraines after periods  . Hepatitis   . PMB (postmenopausal bleeding)     Past Surgical History:  Procedure Laterality Date  . ABDOMINAL HYSTERECTOMY     TAH and BSO  . DILATION AND CURETTAGE OF UTERUS    . INTRAMEDULLARY (IM) NAIL INTERTROCHANTERIC Right 04/10/2016   Procedure: INTRAMEDULLARY (IM) NAIL RIGHT HIP FRACTURE;  Surgeon: Mcarthur Rossetti, MD;  Location: [facility];  Service: Orthopedics;  Laterality: Right;    There were no vitals filed for this visit.   Subjective Assessment - 09/16/18 0821    Subjective  Pt states that she has been having L knee pain for 5 years, maybe longer. She state that ~20 years ago she had a fall and landed on her L knee, which is what she thinks started her L knee pain. She has a h/o R hip fracture with IM nail and rod in her femur. No h/o surgery for either knee. She has the most difficulty with walking due to her L knee pain. She uses a RW for the most part at baseline but  states she can use a SPC. She uses her RW out in the community for the most part. She is limited to walking for 5 mins with RW due to L knee pain. She states that her L knee feels like it wants to hyperextend but she doesn't think her knee wants to buckle or catch. She has had 1 fall in the last 6 months when she sent to sit down and missed the chair.    Patient is accompained by:  Family member   dtr   Limitations  Walking;Standing;House hold activities    How long can you sit comfortably?  no issues    How long can you stand comfortably?  5-8 mins    How long can you walk comfortably?  5 mins    Diagnostic tests  x-rays    Patient Stated Goals  be able to walk without pain    Currently in Pain?  No/denies         [facility] PT Assessment - 09/16/18 0001      Assessment   Medical Diagnosis  Chronic pain of Left knee    Referring Provider (PT)  Jean Rosenthal, MD    Onset Date/Surgical Date  --  5YA, maybe longer   Next MD Visit  nothing scheduled until after PT    Prior Therapy  yes      Balance Screen   Has the patient fallen in the past 6 months  Yes    How many times?  1    Has the patient had a decrease in activity level because of a fear of falling?   Yes    Is the patient reluctant to leave their home because of a fear of falling?   No      Home Environment   Living Environment  Private residence    Living Arrangements  Alone    Additional Comments  stairs are difficult -- easier to go up than down; has friends who have stairs to access their houses      Prior Function   Level of Independence  Independent with community mobility with device;Independent with basic ADLs   still cooking, vacuuming, dressing, bathing independently   Vocation  Retired    Leisure  play with cats, plays on tablet      Observation/Other Assessments   Focus on Therapeutic Outcomes (FOTO)   to be completed next visit       Functional Tests   Functional tests  Sit to Stand      Sit to  Stand   Comments  30sec chair rise: 4x, BUE support      ROM / Strength   AROM / PROM / Strength  AROM;Strength      AROM   AROM Assessment Site  Knee    Right/Left Knee  Right;Left    Right Knee Flexion  104   gross assessment   Left Knee Extension  0    Left Knee Flexion  104      Strength   Strength Assessment Site  Hip;Knee;Ankle    Right Hip Flexion  4/5    Right Hip Extension  3+/5    Right Hip ABduction  3+/5    Left Hip Flexion  4+/5    Left Hip Extension  3/5    Left Hip ABduction  3+/5    Right Knee Flexion  3+/5    Right Knee Extension  4+/5    Left Knee Flexion  3+/5    Left Knee Extension  4+/5    Right Ankle Dorsiflexion  4+/5    Left Ankle Dorsiflexion  4+/5      Palpation   Patella mobility  hypomobile L>R    Palpation comment  increased restrictions and tightness along L HS and quads; palpation to distal L HS recreated same pain       Ambulation/Gait   Ambulation Distance (Feet)  254 Feet   3MWT   Assistive device  Rolling walker      Balance   Balance Assessed  Yes      Static Standing Balance   Static Standing - Balance Support  No upper extremity supported    Static Standing Balance -  Activities   Single Leg Stance - Right Leg;Single Leg Stance - Left Leg    Static Standing - Comment/# of Minutes  R: 2sec or < L: 1 sec or <      Standardized Balance Assessment   Standardized Balance Assessment  --           Objective measurements completed on examination: See above findings.        PT Education - 09/16/18 1150    Education Details  exam findings, HEP, POC  Person(s) Educated  Patient;Child(ren)    Methods  Explanation;Demonstration;Handout    Comprehension  Verbalized understanding;Returned demonstration       PT Short Term Goals - 09/16/18 1201      PT SHORT TERM GOAL #1   Title  Pt will have improved MMT 4-/5 throughout all mm groups tested in order to reduce pain and maximize gait with LRAD.     Time  3    Period   Weeks    Status  New    Target Date  10/07/18      PT SHORT TERM GOAL #2   Title  Pt will be able to perform bil SLS for 3 sec without UE support and with min to no unsteadiness in order to maximize gait and balance.     Time  3    Period  Weeks    Status  New      PT SHORT TERM GOAL #3   Title  Pt will be able to perform 8 chair rises during [place] chair rise test to demo improved balance and functional strength.     Time  3    Period  Weeks    Status  New        PT Long Term Goals - 09/16/18 1202      PT LONG TERM GOAL #1   Title  Pt will have improved MMT to 4/5 or better throughout all mm groups tested in order to further reduce pain and maximize functional mobility.     Time  6    Period  Weeks    Status  New    Target Date  10/28/18      PT LONG TERM GOAL #2   Title  Pt will be able to perform bil SLS for 6sec or > without UE support and with min to no unsteadiness in order to further maximize gait and reduce her risk for falls.     Time  6    Period  Weeks    Status  New      PT LONG TERM GOAL #3   Title  Pt will be able to ambulate 481ft or > during 3MWT with LRAD and gait WFL in order to maximize her HH and limited community ambulation.     Time  6    Period  Weeks    Status  New      PT LONG TERM GOAL #4   Title  Pt will report being able to stand and walk for 10 mins or > with 3/10 L knee pain or < in order to allow her to prepare a small meal and perform light HH chores with greater ease.     Time  6    Period  Weeks    Status  New             Plan - 09/16/18 1151    Clinical Impression Statement  Pt is pleasant 84YO F who presents to OPPT with c/o chronic L knee pain. Pt presents with deficits in bil MMT throughout BLE, especially proximal hip mm and HS, balance, gait, gait speed, functional strength, and overall functional mobility. Pt also has limitations in bil knee AROM and soft tissue restrictions of HS and quads; L knee AROM 0-104deg and R knee  AROM grossly symmetrical to the L. Pt reporting palpation to L HS recreated her same pain. Pt only able to ambulate 263ft with RW during 3MWT at average gait speed of  0.19m/s. Pt needs skilled PT intervention in order to address these impairments so as to reduce her pain, improve strength and mobility, and maximize overall QOL and function at home.     Clinical Presentation  Stable    Clinical Presentation due to:  see flowsheets for objective tests and measures    Clinical Decision Making  Low    Rehab Potential  Fair    PT Frequency  2x / week    PT Duration  6 weeks    PT Treatment/Interventions  ADLs/Self Care Home Management;Aquatic Therapy;Cryotherapy;Electrical Stimulation;Moist Heat;Ultrasound;DME Instruction;Gait training;Stair training;Functional mobility training;Therapeutic activities;Therapeutic exercise;Balance training;Neuromuscular re-education;Patient/family education;Manual techniques;Passive range of motion;Dry needling;Energy conservation;Taping    PT Next Visit Plan  review goals; administer FOTO; begin BLE, functional, core strenghtening and balance training, manual techniques for pain control    PT Home Exercise Plan  eval: seated HS stretch, bridge    Consulted and Agree with Plan of Care  Patient       Patient will benefit from skilled therapeutic intervention in order to improve the following deficits and impairments:  Abnormal gait, Decreased activity tolerance, Decreased balance, Decreased endurance, Decreased range of motion, Decreased mobility, Decreased strength, Difficulty walking, Hypomobility, Increased fascial restricitons, Increased muscle spasms, Impaired flexibility, Improper body mechanics, Postural dysfunction, Pain  Visit Diagnosis: Chronic pain of left knee - Plan: PT plan of care cert/re-cert  Difficulty in walking, not elsewhere classified - Plan: PT plan of care cert/re-cert  Muscle weakness (generalized) - Plan: PT plan of care  cert/re-cert  Unsteadiness on feet - Plan: PT plan of care cert/re-cert     Problem List Patient Active Problem List   Diagnosis Date Noted  . Chronic pain of left knee 08/25/2018  . Chronic pain of right knee 08/25/2018  . Urinary tract infection with hematuria 04/02/2018  . History of uterine cancer 04/01/2017  . Closed right hip fracture ([place]) 04/09/2016  . Hip fracture ([place]) 04/09/2016  . 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  . Endometrial cancer, grade I ([place]) 03/21/2015  . Well woman exam with routine gynecological exam 03/15/2014  . Screening for malignant neoplasm of the cervix 03/15/2014  . Hx of cancer of endometrium 03/06/2013         Geraldine Solar PT, DPT  [facility] [address] [place], [place], [address] Phone: [phone]   Fax:  [phone]  Name: DEIDRE CARINO MRN: [id number] Date of Birth: 12-12-1934

## 2018-09-18 ENCOUNTER — Ambulatory Visit (HOSPITAL_COMMUNITY): Payer: Medicare Other

## 2018-09-18 DIAGNOSIS — G8929 Other chronic pain: Secondary | ICD-10-CM

## 2018-09-18 DIAGNOSIS — R2681 Unsteadiness on feet: Secondary | ICD-10-CM | POA: Diagnosis not present

## 2018-09-18 DIAGNOSIS — M6281 Muscle weakness (generalized): Secondary | ICD-10-CM

## 2018-09-18 DIAGNOSIS — M25562 Pain in left knee: Principal | ICD-10-CM

## 2018-09-18 DIAGNOSIS — R262 Difficulty in walking, not elsewhere classified: Secondary | ICD-10-CM

## 2018-09-18 NOTE — Therapy (Signed)
[place], [place], [address] Phone: [phone]   Fax:  [phone]  Physical Therapy Treatment  Patient Details  Name: Brittany Mahoney MRN: [id number] Date of Birth: March 03, 1935 Referring Provider (PT): Jean Rosenthal, MD   Encounter Date: 09/18/2018  PT End of Session - 09/18/18 0815    Visit Number  2    Number of Visits  13    Date for PT Re-Evaluation  10/28/18   mini reassess due 10/07/18   Authorization Type  Medicare (Secondary: BCBS Supplement)    Authorization Time Period  09/16/18 to 10/28/18    Authorization - Visit Number  2    Authorization - Number of Visits  10    PT Start Time  0815    PT Stop Time  4709    PT Time Calculation (min)  42 min    Activity Tolerance  Patient tolerated treatment well    Behavior During Therapy  [facility] for tasks assessed/performed       Past Medical History:  Diagnosis Date  . Broken hip ([place])   . Cancer ([place])    uterus  . Headache(784.0)    migraines after periods  . Hepatitis   . PMB (postmenopausal bleeding)     Past Surgical History:  Procedure Laterality Date  . ABDOMINAL HYSTERECTOMY     TAH and BSO  . DILATION AND CURETTAGE OF UTERUS    . INTRAMEDULLARY (IM) NAIL INTERTROCHANTERIC Right 04/10/2016   Procedure: INTRAMEDULLARY (IM) NAIL RIGHT HIP FRACTURE;  Surgeon: Mcarthur Rossetti, MD;  Location: [facility];  Service: Orthopedics;  Laterality: Right;    There were no vitals filed for this visit.  Subjective Assessment - 09/18/18 0815    Subjective  Pt states that she is feeling good this morning. Her knees are sorer than the other day and she thinks its due to her HEP. No pain, just sore.     Patient is accompained by:  Family member   dtr   Limitations  Walking;Standing;House hold activities    How long can you sit comfortably?  no issues    How long can you stand comfortably?  5-8 mins    How long can you walk comfortably?  5 mins     Diagnostic tests  x-rays    Patient Stated Goals  be able to walk without pain    Currently in Pain?  No/denies         [facility] PT Assessment - 09/18/18 0001      Observation/Other Assessments   Focus on Therapeutic Outcomes (FOTO)   55% limited          [facility] Adult PT Treatment/Exercise - 09/18/18 0001      Exercises   Exercises  Knee/Hip      Knee/Hip Exercises: Stretches   Passive Hamstring Stretch  Left;3 reps;30 seconds      Knee/Hip Exercises: Seated   Long Arc Quad  Both;10 reps    Long Arc Quad Weight  2 lbs.    Sit to Sand  10 reps;with UE support      Knee/Hip Exercises: Supine   Bridges  Both;10 reps    Straight Leg Raises  Both;10 reps      Knee/Hip Exercises: Sidelying   Clams  BLE x10 reps      Knee/Hip Exercises: Prone   Hamstring Curl  10 reps    Hamstring Curl Limitations  BLE      Manual Therapy  Manual Therapy  Soft tissue mobilization    Manual therapy comments  separate rest of treatment    Soft tissue mobilization  STM to L HS, medial and lateral, to reduce restrictions and pain              PT Education - 09/18/18 0815    Education Details  reviewed goals, exercise technique, continue HEP    Person(s) Educated  Patient    Methods  Explanation;Demonstration    Comprehension  Verbalized understanding;Returned demonstration       PT Short Term Goals - 09/16/18 1201      PT SHORT TERM GOAL #1   Title  Pt will have improved MMT 4-/5 throughout all mm groups tested in order to reduce pain and maximize gait with LRAD.     Time  3    Period  Weeks    Status  New    Target Date  10/07/18      PT SHORT TERM GOAL #2   Title  Pt will be able to perform bil SLS for 3 sec without UE support and with min to no unsteadiness in order to maximize gait and balance.     Time  3    Period  Weeks    Status  New      PT SHORT TERM GOAL #3   Title  Pt will be able to perform 8 chair rises during [place] chair rise test to demo improved balance  and functional strength.     Time  3    Period  Weeks    Status  New        PT Long Term Goals - 09/16/18 1202      PT LONG TERM GOAL #1   Title  Pt will have improved MMT to 4/5 or better throughout all mm groups tested in order to further reduce pain and maximize functional mobility.     Time  6    Period  Weeks    Status  New    Target Date  10/28/18      PT LONG TERM GOAL #2   Title  Pt will be able to perform bil SLS for 6sec or > without UE support and with min to no unsteadiness in order to further maximize gait and reduce her risk for falls.     Time  6    Period  Weeks    Status  New      PT LONG TERM GOAL #3   Title  Pt will be able to ambulate 449ft or > during 3MWT with LRAD and gait WFL in order to maximize her HH and limited community ambulation.     Time  6    Period  Weeks    Status  New      PT LONG TERM GOAL #4   Title  Pt will report being able to stand and walk for 10 mins or > with 3/10 L knee pain or < in order to allow her to prepare a small meal and perform light HH chores with greater ease.     Time  6    Period  Weeks    Status  New            Plan - 09/18/18 0900    Clinical Impression Statement  Began session by reviewing goals and administering FOTO; pt scored 55% limitation on FOTO with no f/u questions afterwards. Session focused on mobility, strength, and manual techniques  to reduce pain. Min cues for form throughout therex but no reports of increased pain. Ended with manual to address restrictions in L HS to help reduce pain. Pt reporting feeling good at EOS. PT educated pt on DOMS following today's session and she verbalized understanding. Continue as planned, progressing as able.     Rehab Potential  Fair    PT Frequency  2x / week    PT Duration  6 weeks    PT Treatment/Interventions  ADLs/Self Care Home Management;Aquatic Therapy;Cryotherapy;Electrical Stimulation;Moist Heat;Ultrasound;DME Instruction;Gait training;Stair  training;Functional mobility training;Therapeutic activities;Therapeutic exercise;Balance training;Neuromuscular re-education;Patient/family education;Manual techniques;Passive range of motion;Dry needling;Energy conservation;Taping    PT Next Visit Plan  continue  BLE, functional, core strenghtening and balance training, manual techniques for pain control    PT Home Exercise Plan  eval: seated HS stretch, bridge    Consulted and Agree with Plan of Care  Patient       Patient will benefit from skilled therapeutic intervention in order to improve the following deficits and impairments:  Abnormal gait, Decreased activity tolerance, Decreased balance, Decreased endurance, Decreased range of motion, Decreased mobility, Decreased strength, Difficulty walking, Hypomobility, Increased fascial restricitons, Increased muscle spasms, Impaired flexibility, Improper body mechanics, Postural dysfunction, Pain  Visit Diagnosis: Chronic pain of left knee  Difficulty in walking, not elsewhere classified  Muscle weakness (generalized)  Unsteadiness on feet     Problem List Patient Active Problem List   Diagnosis Date Noted  . Chronic pain of left knee 08/25/2018  . Chronic pain of right knee 08/25/2018  . Urinary tract infection with hematuria 04/02/2018  . History of uterine cancer 04/01/2017  . Closed right hip fracture ([place]) 04/09/2016  . Hip fracture ([place]) 04/09/2016  . 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  . Endometrial cancer, grade I ([place]) 03/21/2015  . Well woman exam with routine gynecological exam 03/15/2014  . Screening for malignant neoplasm of the cervix 03/15/2014  . Hx of cancer of endometrium 03/06/2013       Geraldine Solar PT, DPT  [facility] [address] [place], [place], [address] Phone: [phone]   Fax:  [phone]  Name: Brittany Mahoney MRN: [id number] Date of Birth: 1935/08/26

## 2018-09-23 ENCOUNTER — Ambulatory Visit (HOSPITAL_COMMUNITY): Payer: Medicare Other

## 2018-09-23 ENCOUNTER — Encounter (HOSPITAL_COMMUNITY): Payer: Self-pay

## 2018-09-23 DIAGNOSIS — R2681 Unsteadiness on feet: Secondary | ICD-10-CM | POA: Diagnosis not present

## 2018-09-23 DIAGNOSIS — G8929 Other chronic pain: Secondary | ICD-10-CM | POA: Diagnosis not present

## 2018-09-23 DIAGNOSIS — M25562 Pain in left knee: Secondary | ICD-10-CM | POA: Diagnosis not present

## 2018-09-23 DIAGNOSIS — R262 Difficulty in walking, not elsewhere classified: Secondary | ICD-10-CM

## 2018-09-23 DIAGNOSIS — M6281 Muscle weakness (generalized): Secondary | ICD-10-CM

## 2018-09-23 NOTE — Patient Instructions (Signed)
Straight Leg Raise    Tighten stomach and slowly raise locked right leg 9-12____ inches from floor. Repeat _10-15__ times per set. Do _1__ sets per session. Do _1___ sessions per day.  http://orth.exer.us/1103   Copyright  VHI. All rights reserved.   KNEE: Extension, Long Arc Quad (Weight)    Place weight around leg. Raise leg until knee is straight. Hold 2-3___ seconds. Use ___ lb weight. 10-15___ reps per set, ___ sets per day, _1__ days per week 1 Copyright  VHI. All rights reserved.   HIP: Abduction / External Rotation - Side-Lying    Lie on side with hip and knees bent. Raise top knee up, squeezing glutes. Keep ankles together. 10-15___ reps per set, __1_ sets per day.   Copyright  VHI. All rights reserved.   Hamstrings    Lie on stomach with ____ pound weight around left ankle. Bend same knee ____ degrees, pointing toes toward knee. Do not bend hips. Hold 2-3____ seconds. Repeat _10-15___ times. Do 1____ sessions per day. CAUTION: Move slowly.  Copyright  VHI. All rights reserved.    Sit-to-Stand Exercise  The sit-to-stand exercise (also known as the chair stand or chair rise exercise) strengthens your lower body and helps you maintain or improve your mobility and independence. The goal is to do the sit-to-stand exercise without using your hands. This will be easier as you become stronger. You should always talk with your health care provider before starting any exercise program, especially if you have had recent surgery. Do the exercise exactly as told by your health care provider and adjust it as directed. It is normal to feel mild stretching, pulling, tightness, or discomfort as you do this exercise, but you should stop right away if you feel sudden pain or your pain gets worse. Do not begin doing this exercise until told by your health care provider. What the sit-to-stand exercise does The sit-to-stand exercise helps to strengthen the muscles in your thighs  and the muscles in the center of your body that give you stability (core muscles). This exercise is especially helpful if:  You have had knee or hip surgery.  You have trouble getting up from a chair, out of a car, or off the toilet. How to do the sit-to-stand exercise 1. Sit toward the front edge of a sturdy chair without armrests. Your knees should be bent and your feet should be flat on the floor and shoulder-width apart. 2. Place your hands lightly on each side of the seat. Keep your back and neck as straight as possible, with your chest slightly forward. 3. Breathe in slowly. Lean forward and slightly shift your weight to the front of your feet. 4. Breathe out as you slowly stand up. Use your hands as little as possible. 5. Stand and pause for a full breath in and out. 6. Breathe in as you sit down slowly. Tighten your core and abdominal muscles to control your lowering as much as possible. 7. Breathe out slowly. 8. Do this exercise 10-15 times. If needed, do it fewer times until you build up strength. 9. Rest for 1 minute, then do another set of 10-15 repetitions. To change the difficulty of the sit-to-stand exercise  If the exercise is too difficult, use a chair with sturdy armrests, and push off the armrests to help you come to the standing position. You can also use the armrests to help slowly lower yourself back to sitting. As this gets easier, try to use your arms less. You  can also place a firm cushion or pillow on the chair to make the surface higher.  If this exercise is too easy, do not use your arms to help raise or lower yourself. You can also wear a weighted vest, use hand weights, increase your repetitions, or try a lower chair. General tips  You may feel tired when starting an exercise routine. This is normal.  You may have muscle soreness that lasts a few days. This is normal. As you get stronger, you may not feel muscle soreness.  Use smooth, steady movements.  Do  not  hold your breath during strength exercises. This can cause unsafe changes in your blood pressure.  Breathe in slowly through your nose, and breathe out slowly through your mouth. Summary  Strengthening your lower body is an important step to help you move safely and independently.  The sit-to-stand exercise helps strengthen the muscles in your thighs and core.  You should always talk with your health care provider before starting any exercise program, especially if you have had recent surgery. This information is not intended to replace advice given to you by your health care provider. Make sure you discuss any questions you have with your health care provider. Document Released: 10/18/2016 Document Revised: 10/18/2016 Document Reviewed: 10/18/2016 Elsevier Interactive Patient Education  2019 [employer].

## 2018-09-23 NOTE — Therapy (Signed)
[place], [place], [address] Phone: [phone]   Fax:  [phone]  Physical Therapy Treatment  Patient Details  Name: Brittany Mahoney MRN: [id number] Date of Birth: 19-May-1935 Referring Provider (PT): Jean Rosenthal, MD   Encounter Date: 09/23/2018  PT End of Session - 09/23/18 0853    Visit Number  3    Number of Visits  13    Date for PT Re-Evaluation  10/28/18   mini reassess due 10/07/18   Authorization Type  Medicare (Secondary: BCBS Supplement)    Authorization Time Period  09/16/18 to 10/28/18    Authorization - Visit Number  3    Authorization - Number of Visits  10    PT Start Time  9163    PT Stop Time  0938    PT Time Calculation (min)  43 min    Activity Tolerance  Patient tolerated treatment well    Behavior During Therapy  [facility] for tasks assessed/performed       Past Medical History:  Diagnosis Date  . Broken hip ([place])   . Cancer ([place])    uterus  . Headache(784.0)    migraines after periods  . Hepatitis   . PMB (postmenopausal bleeding)     Past Surgical History:  Procedure Laterality Date  . ABDOMINAL HYSTERECTOMY     TAH and BSO  . DILATION AND CURETTAGE OF UTERUS    . INTRAMEDULLARY (IM) NAIL INTERTROCHANTERIC Right 04/10/2016   Procedure: INTRAMEDULLARY (IM) NAIL RIGHT HIP FRACTURE;  Surgeon: Mcarthur Rossetti, MD;  Location: [place];  Service: Orthopedics;  Laterality: Right;    There were no vitals filed for this visit.  Subjective Assessment - 09/23/18 0852    Subjective  No pain but continues to be sore from doing her HEP. ANkles a bit more swollen and veins showing this morning.    Patient is accompained by:  Family member   dtr   Limitations  Walking;Standing;House hold activities    How long can you sit comfortably?  no issues    How long can you stand comfortably?  5-8 mins    How long can you walk comfortably?  5 mins    Diagnostic tests  x-rays    Patient  Stated Goals  be able to walk without pain                       [facility] Adult PT Treatment/Exercise - 09/23/18 0001      Knee/Hip Exercises: Stretches   Passive Hamstring Stretch  Left;3 reps;30 seconds   fingers interlaced behind knee     Knee/Hip Exercises: Seated   Long Arc Quad  Both;10 reps    Long Arc Quad Weight  2 lbs.    Sit to Sand  10 reps;with UE support      Knee/Hip Exercises: Supine   Bridges  Both;10 reps   2 sec hold   Straight Leg Raises  Both;10 reps      Knee/Hip Exercises: Sidelying   Clams  BLE x10 reps      Knee/Hip Exercises: Prone   Hamstring Curl  10 reps    Hamstring Curl Limitations  BLE    Hip Extension  10 reps    Hip Extension Limitations  BLE      Manual Therapy   Manual Therapy  Soft tissue mobilization    Manual therapy comments  separate rest of treatment    Soft  tissue mobilization  STM to L HS, medial < lateral, to reduce restrictions and pain              PT Education - 09/23/18 0852    Education Details  Discussed purpose and technique of interventions throughout session. Advanced HEP.    Person(s) Educated  Patient    Methods  Explanation;Demonstration;Handout    Comprehension  Verbalized understanding;Returned demonstration       PT Short Term Goals - 09/23/18 0853      PT SHORT TERM GOAL #1   Title  Pt will have improved MMT 4-/5 throughout all mm groups tested in order to reduce pain and maximize gait with LRAD.     Time  3    Period  Weeks    Status  On-going      PT SHORT TERM GOAL #2   Title  Pt will be able to perform bil SLS for 3 sec without UE support and with min to no unsteadiness in order to maximize gait and balance.     Time  3    Period  Weeks    Status  On-going      PT SHORT TERM GOAL #3   Title  Pt will be able to perform 8 chair rises during [place] chair rise test to demo improved balance and functional strength.     Time  3    Period  Weeks    Status  On-going        PT  Long Term Goals - 09/16/18 1202      PT LONG TERM GOAL #1   Title  Pt will have improved MMT to 4/5 or better throughout all mm groups tested in order to further reduce pain and maximize functional mobility.     Time  6    Period  Weeks    Status  New    Target Date  10/28/18      PT LONG TERM GOAL #2   Title  Pt will be able to perform bil SLS for 6sec or > without UE support and with min to no unsteadiness in order to further maximize gait and reduce her risk for falls.     Time  6    Period  Weeks    Status  New      PT LONG TERM GOAL #3   Title  Pt will be able to ambulate 448ft or > during 3MWT with LRAD and gait WFL in order to maximize her HH and limited community ambulation.     Time  6    Period  Weeks    Status  New      PT LONG TERM GOAL #4   Title  Pt will report being able to stand and walk for 10 mins or > with 3/10 L knee pain or < in order to allow her to prepare a small meal and perform light HH chores with greater ease.     Time  6    Period  Weeks    Status  New            Plan - 09/23/18 7106    Clinical Impression Statement  Continued with established plan of care. Continued with LE strengthening exercises. Added prone hip extension. Continued with manual therapy to bilateral lateral hamstrings, left > right. Advanced HEP to include SLR flex, LAQ, sidelying clams, prone hamstring curl and sit to stands. Pt reporting feeling good at EOS. Continue as planned,  progressing as able.     Rehab Potential  Fair    PT Frequency  2x / week    PT Duration  6 weeks    PT Treatment/Interventions  ADLs/Self Care Home Management;Aquatic Therapy;Cryotherapy;Electrical Stimulation;Moist Heat;Ultrasound;DME Instruction;Gait training;Stair training;Functional mobility training;Therapeutic activities;Therapeutic exercise;Balance training;Neuromuscular re-education;Patient/family education;Manual techniques;Passive range of motion;Dry needling;Energy conservation;Taping    PT  Next Visit Plan  continue  BLE, functional, core strenghtening and balance training, manual techniques for pain control; attempt tandem balance and more weight bearing LE strengthening exercises as able.    PT Home Exercise Plan  eval: seated HS stretch, bridge; 09/23/2018 - SLR flex, LAQ, sidelying clam, prone curl, STS.    Consulted and Agree with Plan of Care  Patient       Patient will benefit from skilled therapeutic intervention in order to improve the following deficits and impairments:  Abnormal gait, Decreased activity tolerance, Decreased balance, Decreased endurance, Decreased range of motion, Decreased mobility, Decreased strength, Difficulty walking, Hypomobility, Increased fascial restricitons, Increased muscle spasms, Impaired flexibility, Improper body mechanics, Postural dysfunction, Pain  Visit Diagnosis: Chronic pain of left knee  Difficulty in walking, not elsewhere classified  Muscle weakness (generalized)  Unsteadiness on feet     Problem List Patient Active Problem List   Diagnosis Date Noted  . Chronic pain of left knee 08/25/2018  . Chronic pain of right knee 08/25/2018  . Urinary tract infection with hematuria 04/02/2018  . History of uterine cancer 04/01/2017  . Closed right hip fracture ([place]) 04/09/2016  . Hip fracture ([place]) 04/09/2016  . 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  . Endometrial cancer, grade I ([place]) 03/21/2015  . Well woman exam with routine gynecological exam 03/15/2014  . Screening for malignant neoplasm of the cervix 03/15/2014  . Hx of cancer of endometrium 03/06/2013    Floria Raveling. Hartnett-Rands, MS, PT [facility] #[id number] 09/23/2018, 9:50 AM  [place] [address] [place], [place], [address] Phone: [phone]   Fax:  [phone]  Name: Brittany Mahoney MRN: [id number] Date of Birth: 19-Nov-1934

## 2018-09-25 ENCOUNTER — Ambulatory Visit (HOSPITAL_COMMUNITY): Payer: Medicare Other | Admitting: Physical Therapy

## 2018-09-25 DIAGNOSIS — R262 Difficulty in walking, not elsewhere classified: Secondary | ICD-10-CM

## 2018-09-25 DIAGNOSIS — G8929 Other chronic pain: Secondary | ICD-10-CM

## 2018-09-25 DIAGNOSIS — R2681 Unsteadiness on feet: Secondary | ICD-10-CM

## 2018-09-25 DIAGNOSIS — M6281 Muscle weakness (generalized): Secondary | ICD-10-CM | POA: Diagnosis not present

## 2018-09-25 DIAGNOSIS — M25562 Pain in left knee: Secondary | ICD-10-CM | POA: Diagnosis not present

## 2018-09-25 NOTE — Therapy (Signed)
[place], [place], [address] Phone: [phone]   Fax:  [phone]  Physical Therapy Treatment  Patient Details  Name: Brittany Mahoney MRN: [id number] Date of Birth: May 14, 1935 Referring Provider (PT): Jean Rosenthal, MD   Encounter Date: 09/25/2018  PT End of Session - 09/25/18 1100    Visit Number  4    Number of Visits  13    Date for PT Re-Evaluation  10/28/18   mini reassess due 10/07/18   Authorization Type  Medicare (Secondary: BCBS Supplement)    Authorization Time Period  09/16/18 to 10/28/18    Authorization - Visit Number  4    Authorization - Number of Visits  10    PT Start Time  [phone]    PT Stop Time  0900    PT Time Calculation (min)  44 min    Activity Tolerance  Patient tolerated treatment well    Behavior During Therapy  [facility] for tasks assessed/performed       Past Medical History:  Diagnosis Date  . Broken hip ([place])   . Cancer ([place])    uterus  . Headache(784.0)    migraines after periods  . Hepatitis   . PMB (postmenopausal bleeding)     Past Surgical History:  Procedure Laterality Date  . ABDOMINAL HYSTERECTOMY     TAH and BSO  . DILATION AND CURETTAGE OF UTERUS    . INTRAMEDULLARY (IM) NAIL INTERTROCHANTERIC Right 04/10/2016   Procedure: INTRAMEDULLARY (IM) NAIL RIGHT HIP FRACTURE;  Surgeon: Mcarthur Rossetti, MD;  Location: [place];  Service: Orthopedics;  Laterality: Right;    There were no vitals filed for this visit.  Subjective Assessment - 09/25/18 0824    Subjective  Pt states her pain is way down today.  STates she feels much better.      Currently in Pain?  No/denies                       [facility] Adult PT Treatment/Exercise - 09/25/18 0001      Knee/Hip Exercises: Seated   Long Arc Quad  Both;10 reps    Long Arc Quad Weight  2 lbs.    Sit to Sand  10 reps;with UE support      Knee/Hip Exercises: Supine   Bridges  Both;15 reps    Straight Leg  Raises  Both;15 reps      Knee/Hip Exercises: Sidelying   Hip ABduction  Both;10 reps    Clams  BLE x15 reps      Knee/Hip Exercises: Prone   Hamstring Curl  15 reps    Hamstring Curl Limitations  BLE    Hip Extension  10 reps    Hip Extension Limitations  BLE      Manual Therapy   Manual Therapy  Soft tissue mobilization    Manual therapy comments  separate rest of treatment    Soft tissue mobilization  STM to L HS, medial < lateral, to reduce restrictions and pain                PT Short Term Goals - 09/23/18 0853      PT SHORT TERM GOAL #1   Title  Pt will have improved MMT 4-/5 throughout all mm groups tested in order to reduce pain and maximize gait with LRAD.     Time  3    Period  Weeks    Status  On-going  PT SHORT TERM GOAL #2   Title  Pt will be able to perform bil SLS for 3 sec without UE support and with min to no unsteadiness in order to maximize gait and balance.     Time  3    Period  Weeks    Status  On-going      PT SHORT TERM GOAL #3   Title  Pt will be able to perform 8 chair rises during [place] chair rise test to demo improved balance and functional strength.     Time  3    Period  Weeks    Status  On-going        PT Long Term Goals - 09/16/18 1202      PT LONG TERM GOAL #1   Title  Pt will have improved MMT to 4/5 or better throughout all mm groups tested in order to further reduce pain and maximize functional mobility.     Time  6    Period  Weeks    Status  New    Target Date  10/28/18      PT LONG TERM GOAL #2   Title  Pt will be able to perform bil SLS for 6sec or > without UE support and with min to no unsteadiness in order to further maximize gait and reduce her risk for falls.     Time  6    Period  Weeks    Status  New      PT LONG TERM GOAL #3   Title  Pt will be able to ambulate 435ft or > during 3MWT with LRAD and gait WFL in order to maximize her HH and limited community ambulation.     Time  6    Period  Weeks     Status  New      PT LONG TERM GOAL #4   Title  Pt will report being able to stand and walk for 10 mins or > with 3/10 L knee pain or < in order to allow her to prepare a small meal and perform light HH chores with greater ease.     Time  6    Period  Weeks    Status  New            Plan - 09/25/18 1102    Clinical Impression Statement  Pt with improved ambulation today.  Able to complete most of therex without issues.  did develop a cramp in her Lt glute while rolling to prone.  Manual completed to Lt LE only this session, glute into hamstring with less tightness noted this session.  Able to increase reps of most exercises this session.     Rehab Potential  Fair    PT Frequency  2x / week    PT Duration  6 weeks    PT Treatment/Interventions  ADLs/Self Care Home Management;Aquatic Therapy;Cryotherapy;Electrical Stimulation;Moist Heat;Ultrasound;DME Instruction;Gait training;Stair training;Functional mobility training;Therapeutic activities;Therapeutic exercise;Balance training;Neuromuscular re-education;Patient/family education;Manual techniques;Passive range of motion;Dry needling;Energy conservation;Taping    PT Next Visit Plan  continue  BLE, functional, core strenghtening and balance training, manual techniques for pain control; attempt tandem balance and more weight bearing LE strengthening exercises as able.    PT Home Exercise Plan  eval: seated HS stretch, bridge; 09/23/2018 - SLR flex, LAQ, sidelying clam, prone curl, STS.    Consulted and Agree with Plan of Care  Patient       Patient will benefit from skilled therapeutic intervention in order  to improve the following deficits and impairments:  Abnormal gait, Decreased activity tolerance, Decreased balance, Decreased endurance, Decreased range of motion, Decreased mobility, Decreased strength, Difficulty walking, Hypomobility, Increased fascial restricitons, Increased muscle spasms, Impaired flexibility, Improper body  mechanics, Postural dysfunction, Pain  Visit Diagnosis: Chronic pain of left knee  Difficulty in walking, not elsewhere classified  Muscle weakness (generalized)  Unsteadiness on feet     Problem List Patient Active Problem List   Diagnosis Date Noted  . Chronic pain of left knee 08/25/2018  . Chronic pain of right knee 08/25/2018  . Urinary tract infection with hematuria 04/02/2018  . History of uterine cancer 04/01/2017  . Closed right hip fracture ([place]) 04/09/2016  . Hip fracture ([place]) 04/09/2016  . 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  . Endometrial cancer, grade I ([place]) 03/21/2015  . Well woman exam with routine gynecological exam 03/15/2014  . Screening for malignant neoplasm of the cervix 03/15/2014  . Hx of cancer of endometrium 03/06/2013   .Teena Irani, PTA/CLT [id number]  Teena Irani 09/25/2018, 11:05 AM  [place], [place], [address] Phone: [phone]   Fax:  [phone]  Name: Brittany Mahoney MRN: [id number] Date of Birth: Nov 05, 1934

## 2018-09-30 ENCOUNTER — Telehealth (HOSPITAL_COMMUNITY): Payer: Self-pay | Admitting: Pulmonary Disease

## 2018-09-30 ENCOUNTER — Ambulatory Visit (HOSPITAL_COMMUNITY): Payer: Medicare Other

## 2018-09-30 NOTE — Telephone Encounter (Signed)
09/30/18  pt left a message that she wouldn't be here today because her head was hurting pretty bad

## 2018-10-02 ENCOUNTER — Ambulatory Visit (HOSPITAL_COMMUNITY): Payer: Medicare Other

## 2018-10-02 ENCOUNTER — Encounter (HOSPITAL_COMMUNITY): Payer: Self-pay

## 2018-10-02 DIAGNOSIS — R2681 Unsteadiness on feet: Secondary | ICD-10-CM | POA: Diagnosis not present

## 2018-10-02 DIAGNOSIS — M25562 Pain in left knee: Secondary | ICD-10-CM | POA: Diagnosis not present

## 2018-10-02 DIAGNOSIS — R262 Difficulty in walking, not elsewhere classified: Secondary | ICD-10-CM

## 2018-10-02 DIAGNOSIS — G8929 Other chronic pain: Secondary | ICD-10-CM | POA: Diagnosis not present

## 2018-10-02 DIAGNOSIS — M6281 Muscle weakness (generalized): Secondary | ICD-10-CM

## 2018-10-02 NOTE — Therapy (Signed)
[place], [place], [address] Phone: [phone]   Fax:  [phone]  Physical Therapy Treatment  Patient Details  Name: Brittany Mahoney MRN: [id number] Date of Birth: [birth year] Referring Provider (PT): Jean Rosenthal, MD   Encounter Date: 10/02/2018  PT End of Session - 10/02/18 0824    Visit Number  5    Number of Visits  13    Date for PT Re-Evaluation  10/28/18   Minireassess 10/07/18   Authorization Type  Medicare (Secondary: BCBS Supplement)    Authorization Time Period  09/16/18 to 10/28/18    Authorization - Visit Number  5    Authorization - Number of Visits  10    PT Start Time  0816    PT Stop Time  0904    PT Time Calculation (min)  48 min    Activity Tolerance  Patient tolerated treatment well    Behavior During Therapy  [facility] for tasks assessed/performed       Past Medical History:  Diagnosis Date  . Broken hip ([place])   . Cancer ([place])    uterus  . Headache(784.0)    migraines after periods  . Hepatitis   . PMB (postmenopausal bleeding)     Past Surgical History:  Procedure Laterality Date  . ABDOMINAL HYSTERECTOMY     TAH and BSO  . DILATION AND CURETTAGE OF UTERUS    . INTRAMEDULLARY (IM) NAIL INTERTROCHANTERIC Right 04/10/2016   Procedure: INTRAMEDULLARY (IM) NAIL RIGHT HIP FRACTURE;  Surgeon: Mcarthur Rossetti, MD;  Location: [facility];  Service: Orthopedics;  Laterality: Right;    There were no vitals filed for this visit.  Subjective Assessment - 10/02/18 0821    Subjective  Pt stated she is feeling good today, reports decrease in swelling and no pain unless steps wrong.  Reports compliance with HEP daily.    Patient Stated Goals  be able to walk without pain    Currently in Pain?  No/denies                       [facility] Adult PT Treatment/Exercise - 10/02/18 0001      Knee/Hip Exercises: Standing   Heel Raises  10 reps    Other Standing Knee Exercises  Tandem  stance 3x 20-30"      Knee/Hip Exercises: Seated   Sit to Sand  10 reps;with UE support   crepitus reports of pain     Knee/Hip Exercises: Supine   Bridges  Both;15 reps    Straight Leg Raises  Both;15 reps      Knee/Hip Exercises: Sidelying   Clams  BLE x15 reps with RTB      Knee/Hip Exercises: Prone   Hamstring Curl  15 reps    Hamstring Curl Limitations  BLE    Hip Extension  10 reps    Hip Extension Limitations  BLE, cueing for form       Manual Therapy   Manual Therapy  Soft tissue mobilization    Manual therapy comments  separate rest of treatment    Soft tissue mobilization  STM to L HS, medial < lateral, to reduce restrictions and pain                PT Short Term Goals - 09/23/18 0853      PT SHORT TERM GOAL #1   Title  Pt will have improved MMT 4-/5 throughout all mm groups tested  in order to reduce pain and maximize gait with LRAD.     Time  3    Period  Weeks    Status  On-going      PT SHORT TERM GOAL #2   Title  Pt will be able to perform bil SLS for 3 sec without UE support and with min to no unsteadiness in order to maximize gait and balance.     Time  3    Period  Weeks    Status  On-going      PT SHORT TERM GOAL #3   Title  Pt will be able to perform 8 chair rises during [place] chair rise test to demo improved balance and functional strength.     Time  3    Period  Weeks    Status  On-going        PT Long Term Goals - 09/16/18 1202      PT LONG TERM GOAL #1   Title  Pt will have improved MMT to 4/5 or better throughout all mm groups tested in order to further reduce pain and maximize functional mobility.     Time  6    Period  Weeks    Status  New    Target Date  10/28/18      PT LONG TERM GOAL #2   Title  Pt will be able to perform bil SLS for 6sec or > without UE support and with min to no unsteadiness in order to further maximize gait and reduce her risk for falls.     Time  6    Period  Weeks    Status  New      PT LONG  TERM GOAL #3   Title  Pt will be able to ambulate 480ft or > during 3MWT with LRAD and gait WFL in order to maximize her HH and limited community ambulation.     Time  6    Period  Weeks    Status  New      PT LONG TERM GOAL #4   Title  Pt will report being able to stand and walk for 10 mins or > with 3/10 L knee pain or < in order to allow her to prepare a small meal and perform light HH chores with greater ease.     Time  6    Period  Weeks    Status  New            Plan - 10/02/18 1423    Clinical Impression Statement  Added CKC for functional strengthening with gait and balance training wiht cueing for posture to assist with balance.  No reports of cramping thorugh session.  Did express some pain wiht sit to stands and minimal hand use.  Reports mainly tightness wiht hamstrings.  EOS with manual to address restrictions hamstring region more lateral > medial.  No reports of increased pain through session.      Rehab Potential  Fair    PT Frequency  2x / week    PT Duration  6 weeks    PT Treatment/Interventions  ADLs/Self Care Home Management;Aquatic Therapy;Cryotherapy;Electrical Stimulation;Moist Heat;Ultrasound;DME Instruction;Gait training;Stair training;Functional mobility training;Therapeutic activities;Therapeutic exercise;Balance training;Neuromuscular re-education;Patient/family education;Manual techniques;Passive range of motion;Dry needling;Energy conservation;Taping    PT Next Visit Plan  continue  BLE, functional, core strenghtening and balance training, manual techniques for pain control; continue tandem balance and more weight bearing LE strengthening exercises as able.    PT Home  Exercise Plan  eval: seated HS stretch, bridge; 09/23/2018 - SLR flex, LAQ, sidelying clam, prone curl, STS.       Patient will benefit from skilled therapeutic intervention in order to improve the following deficits and impairments:  Abnormal gait, Decreased activity tolerance, Decreased  balance, Decreased endurance, Decreased range of motion, Decreased mobility, Decreased strength, Difficulty walking, Hypomobility, Increased fascial restricitons, Increased muscle spasms, Impaired flexibility, Improper body mechanics, Postural dysfunction, Pain  Visit Diagnosis: Chronic pain of left knee  Difficulty in walking, not elsewhere classified  Muscle weakness (generalized)  Unsteadiness on feet     Problem List Patient Active Problem List   Diagnosis Date Noted  . Chronic pain of left knee 08/25/2018  . Chronic pain of right knee 08/25/2018  . Urinary tract infection with hematuria 04/02/2018  . History of uterine cancer 04/01/2017  . Closed right hip fracture ([place]) 04/09/2016  . Hip fracture ([place]) 04/09/2016  . 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  . Endometrial cancer, grade I ([place]) 03/21/2015  . Well woman exam with routine gynecological exam 03/15/2014  . Screening for malignant neoplasm of the cervix 03/15/2014  . Hx of cancer of endometrium 03/06/2013   Ihor Austin, [place]; [place]  Aldona Lento 10/02/2018, 2:27 PM  [place], [place], [address] Phone: [phone]   Fax:  [phone]  Name: BRYSON PALEN MRN: [id number] Date of Birth: 1935-04-08

## 2018-10-07 ENCOUNTER — Encounter (HOSPITAL_COMMUNITY): Payer: Medicare Other

## 2018-10-07 ENCOUNTER — Encounter (HOSPITAL_COMMUNITY): Payer: Self-pay

## 2018-10-07 ENCOUNTER — Ambulatory Visit (HOSPITAL_COMMUNITY): Payer: Medicare Other

## 2018-10-07 DIAGNOSIS — R2681 Unsteadiness on feet: Secondary | ICD-10-CM

## 2018-10-07 DIAGNOSIS — M6281 Muscle weakness (generalized): Secondary | ICD-10-CM | POA: Diagnosis not present

## 2018-10-07 DIAGNOSIS — M25562 Pain in left knee: Principal | ICD-10-CM

## 2018-10-07 DIAGNOSIS — R262 Difficulty in walking, not elsewhere classified: Secondary | ICD-10-CM

## 2018-10-07 DIAGNOSIS — G8929 Other chronic pain: Secondary | ICD-10-CM | POA: Diagnosis not present

## 2018-10-07 NOTE — Therapy (Signed)
[place], [place], [address] Phone: [phone]   Fax:  [phone]   Progress Note Reporting Period 09/16/18 to 10/07/18  See note below for Objective Data and Assessment of Progress/Goals.   Physical Therapy Treatment  Patient Details  Name: Brittany Mahoney MRN: [id number] Date of Birth: 1935/05/19 Referring Provider (PT): Jean Rosenthal, MD   Encounter Date: 10/07/2018  PT End of Session - 10/07/18 0813    Visit Number  6    Number of Visits  13    Date for PT Re-Evaluation  10/28/18   Minireassess completed 10/07/18   Authorization Type  Medicare (Secondary: BCBS Supplement)    Authorization Time Period  09/16/18 to 10/28/18    Authorization - Visit Number  6    Authorization - Number of Visits  13    PT Start Time  0813    PT Stop Time  9179    PT Time Calculation (min)  41 min    Activity Tolerance  Patient tolerated treatment well    Behavior During Therapy  [facility] for tasks assessed/performed       Past Medical History:  Diagnosis Date  . Broken hip ([place])   . Cancer ([place])    uterus  . Headache(784.0)    migraines after periods  . Hepatitis   . PMB (postmenopausal bleeding)     Past Surgical History:  Procedure Laterality Date  . ABDOMINAL HYSTERECTOMY     TAH and BSO  . DILATION AND CURETTAGE OF UTERUS    . INTRAMEDULLARY (IM) NAIL INTERTROCHANTERIC Right 04/10/2016   Procedure: INTRAMEDULLARY (IM) NAIL RIGHT HIP FRACTURE;  Surgeon: Mcarthur Rossetti, MD;  Location: [place];  Service: Orthopedics;  Laterality: Right;    There were no vitals filed for this visit.  Subjective Assessment - 10/07/18 0814    Subjective  Pt states that her knees are sore but not painful. She reports continued compliance with HEP.     Patient Stated Goals  be able to walk without pain    Currently in Pain?  No/denies         [facility] PT Assessment - 10/07/18 0001      Assessment   Medical Diagnosis   Chronic pain of Left knee    Referring Provider (PT)  Jean Rosenthal, MD    Onset Date/Surgical Date  --   5YA, maybe longer   Next MD Visit  nothing scheduled until after PT    Prior Therapy  yes      Observation/Other Assessments   Focus on Therapeutic Outcomes (FOTO)   50% limited   was 55% limited     Functional Tests   Functional tests  Sit to Stand      Sit to Stand   Comments  30sec chair rise: 4x, hands on thighs   was 4x, BUE support     Strength   Right Hip Flexion  4/5   was 4   Right Hip Extension  3+/5   was 3+   Right Hip ABduction  4-/5   was 3+   Left Hip Flexion  4+/5   was 4+   Left Hip Extension  3+/5   was 3   Left Hip ABduction  4/5   was 3+   Right Knee Flexion  3+/5   was 3+   Right Knee Extension  4+/5   was 4+   Left Knee Flexion  4-/5   was 3+  Left Knee Extension  4+/5   was 4+     Ambulation/Gait   Ambulation Distance (Feet)  308 Feet   3MWT; was 224f with RW   Assistive device  Rolling walker    Gait Pattern  Step-through pattern;Decreased dorsiflexion - right;Decreased dorsiflexion - left;Trendelenburg;Antalgic;Trunk flexed      Balance   Balance Assessed  Yes      Static Standing Balance   Static Standing - Balance Support  No upper extremity supported    Static Standing Balance -  Activities   Single Leg Stance - Right Leg;Single Leg Stance - Left Leg    Static Standing - Comment/# of Minutes  R: 1sec or < L: 2 sec or <   was 2sec R, 1sec L            [facility] Adult PT Treatment/Exercise - 10/07/18 0001      Knee/Hip Exercises: Machines for Strengthening   Total Gym Leg Press  20#, BLE, 2x15 reps      Knee/Hip Exercises: Supine   Short Arc Quad Sets  Both;15 reps    Short Arc Quad Sets Limitations  3#, bolster    Straight Leg Raises  Both;10 reps           PT Education - 10/07/18 0813    Education Details  reassessment findings    Person(s) Educated  Patient    Methods  Explanation;Demonstration     Comprehension  Verbalized understanding;Returned demonstration       PT Short Term Goals - 10/07/18 0816      PT SHORT TERM GOAL #1   Title  Pt will have improved MMT 4-/5 throughout all mm groups tested in order to reduce pain and maximize gait with LRAD.     Baseline  1/28: see MMT    Time  3    Period  Weeks    Status  Partially Met      PT SHORT TERM GOAL #2   Title  Pt will be able to perform bil SLS for 3 sec without UE support and with min to no unsteadiness in order to maximize gait and balance.     Baseline  1/28: 1 sec R, 2 sec L    Time  3    Period  Weeks    Status  On-going      PT SHORT TERM GOAL #3   Title  Pt will be able to perform 8 chair rises during [place] test to demo improved balance and functional strength.     Baseline  1/28: 4x, BUE on thighs    Time  3    Period  Weeks    Status  On-going        PT Long Term Goals - 10/07/18 09702     PT LONG TERM GOAL #1   Title  Pt will have improved MMT to 4/5 or better throughout all mm groups tested in order to further reduce pain and maximize functional mobility.     Baseline  1/28: see MMT    Time  6    Period  Weeks    Status  On-going      PT LONG TERM GOAL #2   Title  Pt will be able to perform bil SLS for 6sec or > without UE support and with min to no unsteadiness in order to further maximize gait and reduce her risk for falls.     Baseline  1/28: 1 sec R,  2 sec L    Time  6    Period  Weeks    Status  On-going      PT LONG TERM GOAL #3   Title  Pt will be able to ambulate 465f or > during 3MWT with LRAD and gait WFL in order to maximize her HH and limited community ambulation.     Baseline  1/28: 3020f RW, gait deviations conitnue    Time  6    Period  Weeks    Status  On-going      PT LONG TERM GOAL #4   Title  Pt will report being able to stand and walk for 10 mins or > with 3/10 L knee pain or < in order to allow her to prepare a small meal and perform light HH chores with  greater ease.     Baseline  1/28: stands and walks for about 3 mins comfortably now    Time  6    Period  Weeks    Status  On-going            Plan - 10/07/18 086269  Clinical Impression Statement  PT reassessed pt's goals and outcome measures this date. Pt has overall made good progress towards goals as illustrated above. Her functional strength has slightly improved as she was able to perform 4STS during 30sec chair rise test with her UE on thighs (was using UE on chair at eval) and her 3MWT improved by ~5059fHer strength has also made some improvements throughout as illustrated above. Pt feels that everything has improved since starting therapy, but reports that balance and STS transitions still cause her the most difficulty. Rest of session focused on general strengthening of BLE. Attempted wall squats but major crepitus noted and pt reporting discomfort with activity so held exercise for now. Pt needs continued skilled PT intervention to address continued impairments in strength, functional strength, and balance in order to maximize overall QOL and strength.     Rehab Potential  Fair    PT Frequency  2x / week    PT Duration  6 weeks    PT Treatment/Interventions  ADLs/Self Care Home Management;Aquatic Therapy;Cryotherapy;Electrical Stimulation;Moist Heat;Ultrasound;DME Instruction;Gait training;Stair training;Functional mobility training;Therapeutic activities;Therapeutic exercise;Balance training;Neuromuscular re-education;Patient/family education;Manual techniques;Passive range of motion;Dry needling;Energy conservation;Taping    PT Next Visit Plan  continue leg press machine; continue BLE, functional, core strenghtening and balance training, manual techniques for pain control; continue tandem balance and more weight bearing LE strengthening exercises as able.    PT Home Exercise Plan  eval: seated HS stretch, bridge; 09/23/2018 - SLR flex, LAQ, sidelying clam, prone curl, STS.     Consulted and Agree with Plan of Care  Patient       Patient will benefit from skilled therapeutic intervention in order to improve the following deficits and impairments:  Abnormal gait, Decreased activity tolerance, Decreased balance, Decreased endurance, Decreased range of motion, Decreased mobility, Decreased strength, Difficulty walking, Hypomobility, Increased fascial restricitons, Increased muscle spasms, Impaired flexibility, Improper body mechanics, Postural dysfunction, Pain  Visit Diagnosis: Chronic pain of left knee  Difficulty in walking, not elsewhere classified  Muscle weakness (generalized)  Unsteadiness on feet     Problem List Patient Active Problem List   Diagnosis Date Noted  . Chronic pain of left knee 08/25/2018  . Chronic pain of right knee 08/25/2018  . Urinary tract infection with hematuria 04/02/2018  . History of uterine cancer 04/01/2017  . Closed right hip fracture (  [place]) 04/09/2016  . Hip fracture ([place]) 04/09/2016  . 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  . Endometrial cancer, grade I ([place]) 03/21/2015  . Well woman exam with routine gynecological exam 03/15/2014  . Screening for malignant neoplasm of the cervix 03/15/2014  . Hx of cancer of endometrium 03/06/2013       Geraldine Solar PT, DPT  [place] [address] [place], [place], [address] Phone: [phone]   Fax:  [phone]  Name: Brittany Mahoney MRN: [id number] Date of Birth: Jul 22, 1935

## 2018-10-09 ENCOUNTER — Ambulatory Visit (HOSPITAL_COMMUNITY): Payer: Medicare Other | Admitting: Physical Therapy

## 2018-10-09 DIAGNOSIS — R2681 Unsteadiness on feet: Secondary | ICD-10-CM | POA: Diagnosis not present

## 2018-10-09 DIAGNOSIS — M25562 Pain in left knee: Principal | ICD-10-CM

## 2018-10-09 DIAGNOSIS — M6281 Muscle weakness (generalized): Secondary | ICD-10-CM

## 2018-10-09 DIAGNOSIS — R262 Difficulty in walking, not elsewhere classified: Secondary | ICD-10-CM

## 2018-10-09 DIAGNOSIS — G8929 Other chronic pain: Secondary | ICD-10-CM

## 2018-10-09 NOTE — Therapy (Signed)
[place], [place], [address] Phone: [phone]   Fax:  [phone]  Physical Therapy Treatment  Patient Details  Name: Brittany Mahoney MRN: [id number] Date of Birth: [birth year] Referring Provider (PT): Jean Rosenthal, MD   Encounter Date: 10/09/2018  PT End of Session - 10/09/18 0848    Visit Number  7    Number of Visits  13    Date for PT Re-Evaluation  10/28/18   Minireassess completed 10/07/18   Authorization Type  Medicare (Secondary: BCBS Supplement)    Authorization Time Period  09/16/18 to 10/28/18    Authorization - Visit Number  7    Authorization - Number of Visits  13    PT Start Time  0820    PT Stop Time  0900    PT Time Calculation (min)  40 min    Activity Tolerance  Patient tolerated treatment well    Behavior During Therapy  [facility] for tasks assessed/performed       Past Medical History:  Diagnosis Date  . Broken hip ([place])   . Cancer ([place])    uterus  . Headache(784.0)    migraines after periods  . Hepatitis   . PMB (postmenopausal bleeding)     Past Surgical History:  Procedure Laterality Date  . ABDOMINAL HYSTERECTOMY     TAH and BSO  . DILATION AND CURETTAGE OF UTERUS    . INTRAMEDULLARY (IM) NAIL INTERTROCHANTERIC Right 04/10/2016   Procedure: INTRAMEDULLARY (IM) NAIL RIGHT HIP FRACTURE;  Surgeon: Mcarthur Rossetti, MD;  Location: [place];  Service: Orthopedics;  Laterality: Right;    There were no vitals filed for this visit.                    [place] Adult PT Treatment/Exercise - 10/09/18 0001      Knee/Hip Exercises: Stretches   Passive Hamstring Stretch  Left;2 reps;30 seconds    Passive Hamstring Stretch Limitations  long sitting      Knee/Hip Exercises: Machines for Strengthening   Total Gym Leg Press  20#, BLE, 2x15 reps      Knee/Hip Exercises: Standing   Other Standing Knee Exercises  Tandem stance 3x 20-30"      Knee/Hip Exercises: Seated   Sit to Sand  Other (comment)   unable due to crepitus and pain     Knee/Hip Exercises: Supine   Short Arc Quad Sets  Both;15 reps;2 sets    Short Arc Quad Sets Limitations  3#    Bridges  Both;15 reps;2 sets    Straight Leg Raises  Both;15 reps;2 sets      Knee/Hip Exercises: Sidelying   Hip ABduction  Both;10 reps;2 sets;Limitations    Hip ABduction Limitations  cues for form      Knee/Hip Exercises: Prone   Hamstring Curl  15 reps;2 sets    Hamstring Curl Limitations  BLE    Hip Extension  10 reps;2 sets    Hip Extension Limitations  BLE, cueing for form       Manual Therapy   Manual Therapy  Soft tissue mobilization    Manual therapy comments  separate rest of treatment    Soft tissue mobilization  STM to L HS, medial < lateral, to reduce restrictions and pain                PT Short Term Goals - 10/07/18 0816      PT SHORT TERM GOAL #  1   Title  Pt will have improved MMT 4-/5 throughout all mm groups tested in order to reduce pain and maximize gait with LRAD.     Baseline  1/28: see MMT    Time  3    Period  Weeks    Status  Partially Met      PT SHORT TERM GOAL #2   Title  Pt will be able to perform bil SLS for 3 sec without UE support and with min to no unsteadiness in order to maximize gait and balance.     Baseline  1/28: 1 sec R, 2 sec L    Time  3    Period  Weeks    Status  On-going      PT SHORT TERM GOAL #3   Title  Pt will be able to perform 8 chair rises during [place] chair rise test to demo improved balance and functional strength.     Baseline  1/28: 4x, BUE on thighs    Time  3    Period  Weeks    Status  On-going        PT Long Term Goals - 10/07/18 1607      PT LONG TERM GOAL #1   Title  Pt will have improved MMT to 4/5 or better throughout all mm groups tested in order to further reduce pain and maximize functional mobility.     Baseline  1/28: see MMT    Time  6    Period  Weeks    Status  On-going      PT LONG TERM GOAL #2    Title  Pt will be able to perform bil SLS for 6sec or > without UE support and with min to no unsteadiness in order to further maximize gait and reduce her risk for falls.     Baseline  1/28: 1 sec R, 2 sec L    Time  6    Period  Weeks    Status  On-going      PT LONG TERM GOAL #3   Title  Pt will be able to ambulate 433f or > during 3MWT with LRAD and gait WFL in order to maximize her HH and limited community ambulation.     Baseline  1/28: 3015f RW, gait deviations conitnue    Time  6    Period  Weeks    Status  On-going      PT LONG TERM GOAL #4   Title  Pt will report being able to stand and walk for 10 mins or > with 3/10 L knee pain or < in order to allow her to prepare a small meal and perform light HH chores with greater ease.     Baseline  1/28: stands and walks for about 3 mins comfortably now    Time  6    Period  Weeks    Status  On-going            Plan - 10/09/18 0946    Clinical Impression Statement  continued with working on improving LE strength and balance this session.  ATtempted STS, even with additional foam riser, too much crepitus and pain in knees to complete.  Contiued with leg press with no pain or issues.  Tandem balance remains at 20" max or less with each attempt.  Less tightness and cramps reported in Lt hamstring.  Instructed with long sitting hamstring stretch with good results.  Rehab Potential  Fair    PT Frequency  2x / week    PT Duration  6 weeks    PT Treatment/Interventions  ADLs/Self Care Home Management;Aquatic Therapy;Cryotherapy;Electrical Stimulation;Moist Heat;Ultrasound;DME Instruction;Gait training;Stair training;Functional mobility training;Therapeutic activities;Therapeutic exercise;Balance training;Neuromuscular re-education;Patient/family education;Manual techniques;Passive range of motion;Dry needling;Energy conservation;Taping    PT Next Visit Plan  continue leg press machine; continue BLE, functional, core strenghtening and  balance training, manual techniques for pain control; continue tandem balance and more weight bearing LE strengthening exercises as able.    PT Home Exercise Plan  eval: seated HS stretch, bridge; 09/23/2018 - SLR flex, LAQ, sidelying clam, prone curl, STS.    Consulted and Agree with Plan of Care  Patient       Patient will benefit from skilled therapeutic intervention in order to improve the following deficits and impairments:  Abnormal gait, Decreased activity tolerance, Decreased balance, Decreased endurance, Decreased range of motion, Decreased mobility, Decreased strength, Difficulty walking, Hypomobility, Increased fascial restricitons, Increased muscle spasms, Impaired flexibility, Improper body mechanics, Postural dysfunction, Pain  Visit Diagnosis: Chronic pain of left knee  Difficulty in walking, not elsewhere classified  Muscle weakness (generalized)  Unsteadiness on feet     Problem List Patient Active Problem List   Diagnosis Date Noted  . Chronic pain of left knee 08/25/2018  . Chronic pain of right knee 08/25/2018  . Urinary tract infection with hematuria 04/02/2018  . History of uterine cancer 04/01/2017  . Closed right hip fracture ([place]) 04/09/2016  . Hip fracture ([place]) 04/09/2016  . 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  . Endometrial cancer, grade I ([place]) 03/21/2015  . Well woman exam with routine gynecological exam 03/15/2014  . Screening for malignant neoplasm of the cervix 03/15/2014  . Hx of cancer of endometrium 03/06/2013   Teena Irani, PTA/CLT [phone]  Teena Irani 10/09/2018, 9:50 AM  [place] [address] [place], [place], [address] Phone: [phone]   Fax:  [phone]  Name: Brittany Mahoney MRN: [id number] Date of Birth: [birth year]

## 2018-10-14 ENCOUNTER — Ambulatory Visit (HOSPITAL_COMMUNITY): Payer: Medicare Other | Attending: Orthopaedic Surgery | Admitting: Physical Therapy

## 2018-10-14 DIAGNOSIS — M25562 Pain in left knee: Secondary | ICD-10-CM | POA: Diagnosis not present

## 2018-10-14 DIAGNOSIS — R262 Difficulty in walking, not elsewhere classified: Secondary | ICD-10-CM | POA: Insufficient documentation

## 2018-10-14 DIAGNOSIS — M6281 Muscle weakness (generalized): Secondary | ICD-10-CM | POA: Diagnosis not present

## 2018-10-14 DIAGNOSIS — R2681 Unsteadiness on feet: Secondary | ICD-10-CM | POA: Diagnosis not present

## 2018-10-14 DIAGNOSIS — G8929 Other chronic pain: Secondary | ICD-10-CM

## 2018-10-14 NOTE — Therapy (Signed)
[place], [place], [address] Phone: [phone]   Fax:  [phone]  Physical Therapy Treatment  Patient Details  Name: Brittany Mahoney MRN: [id number] Date of Birth: 1935/03/03 Referring Provider (PT): Jean Rosenthal, MD   Encounter Date: 10/14/2018  PT End of Session - 10/14/18 1154    Visit Number  8    Number of Visits  13    Date for PT Re-Evaluation  10/28/18   Minireassess completed 10/07/18   Authorization Type  Medicare (Secondary: BCBS Supplement)    Authorization Time Period  09/16/18 to 10/28/18    Authorization - Visit Number  8    Authorization - Number of Visits  13    PT Start Time  0300    PT Stop Time  1035    PT Time Calculation (min)  40 min    Activity Tolerance  Patient tolerated treatment well    Behavior During Therapy  [facility] for tasks assessed/performed       Past Medical History:  Diagnosis Date  . Broken hip ([place])   . Cancer ([place])    uterus  . Headache(784.0)    migraines after periods  . Hepatitis   . PMB (postmenopausal bleeding)     Past Surgical History:  Procedure Laterality Date  . ABDOMINAL HYSTERECTOMY     TAH and BSO  . DILATION AND CURETTAGE OF UTERUS    . INTRAMEDULLARY (IM) NAIL INTERTROCHANTERIC Right 04/10/2016   Procedure: INTRAMEDULLARY (IM) NAIL RIGHT HIP FRACTURE;  Surgeon: Mcarthur Rossetti, MD;  Location: [place];  Service: Orthopedics;  Laterality: Right;    There were no vitals filed for this visit.  Subjective Assessment - 10/14/18 0953    Subjective  pt was late for appointment.  states she used her cane and used her reacher to retrieve items blown out in the yard.      Currently in Pain?  No/denies                       [facility] Adult PT Treatment/Exercise - 10/14/18 0001      Ambulation/Gait   Gait Comments  gait with SPC 200 feet      Knee/Hip Exercises: Stretches   Passive Hamstring Stretch  Left;2 reps;30 seconds    Passive Hamstring Stretch Limitations  long sitting      Knee/Hip Exercises: Machines for Strengthening   Total Gym Leg Press  20#, BLE, 2x15 reps      Knee/Hip Exercises: Supine   Short Arc Quad Sets  Both;15 reps;2 sets    Short Arc Quad Sets Limitations  3#    Bridges  Both;15 reps;2 sets    Straight Leg Raises  Both;15 reps;2 sets      Knee/Hip Exercises: Sidelying   Hip ABduction  Both;10 reps;2 sets;Limitations    Hip ABduction Limitations  cues for form               PT Short Term Goals - 10/07/18 0816      PT SHORT TERM GOAL #1   Title  Pt will have improved MMT 4-/5 throughout all mm groups tested in order to reduce pain and maximize gait with LRAD.     Baseline  1/28: see MMT    Time  3    Period  Weeks    Status  Partially Met      PT SHORT TERM GOAL #2   Title  Pt will  be able to perform bil SLS for 3 sec without UE support and with min to no unsteadiness in order to maximize gait and balance.     Baseline  1/28: 1 sec R, 2 sec L    Time  3    Period  Weeks    Status  On-going      PT SHORT TERM GOAL #3   Title  Pt will be able to perform 8 chair rises during [place] chair rise test to demo improved balance and functional strength.     Baseline  1/28: 4x, BUE on thighs    Time  3    Period  Weeks    Status  On-going        PT Long Term Goals - 10/07/18 9628      PT LONG TERM GOAL #1   Title  Pt will have improved MMT to 4/5 or better throughout all mm groups tested in order to further reduce pain and maximize functional mobility.     Baseline  1/28: see MMT    Time  6    Period  Weeks    Status  On-going      PT LONG TERM GOAL #2   Title  Pt will be able to perform bil SLS for 6sec or > without UE support and with min to no unsteadiness in order to further maximize gait and reduce her risk for falls.     Baseline  1/28: 1 sec R, 2 sec L    Time  6    Period  Weeks    Status  On-going      PT LONG TERM GOAL #3   Title  Pt will be able to  ambulate 453f or > during 3MWT with LRAD and gait WFL in order to maximize her HH and limited community ambulation.     Baseline  1/28: 3081f RW, gait deviations conitnue    Time  6    Period  Weeks    Status  On-going      PT LONG TERM GOAL #4   Title  Pt will report being able to stand and walk for 10 mins or > with 3/10 L knee pain or < in order to allow her to prepare a small meal and perform light HH chores with greater ease.     Baseline  1/28: stands and walks for about 3 mins comfortably now    Time  6    Period  Weeks    Status  On-going            Plan - 10/14/18 1156    Clinical Impression Statement  Pt with concerns regarding gait sequencing using SPC. Instructed with correct sequencing and patient was able to demonstrate appropriately.  Avoided sit to stand this session due to crepitus and pain in knees.  continued with long sititng hamstirng stretch to help loosen hamstring mm.      Rehab Potential  Fair    PT Frequency  2x / week    PT Duration  6 weeks    PT Treatment/Interventions  ADLs/Self Care Home Management;Aquatic Therapy;Cryotherapy;Electrical Stimulation;Moist Heat;Ultrasound;DME Instruction;Gait training;Stair training;Functional mobility training;Therapeutic activities;Therapeutic exercise;Balance training;Neuromuscular re-education;Patient/family education;Manual techniques;Passive range of motion;Dry needling;Energy conservation;Taping    PT Next Visit Plan  continue leg press machine; continue BLE, functional, core strenghtening and balance training, manual techniques for pain control; continue tandem balance and more weight bearing LE strengthening exercises as able.. Marland KitchenRe-eval X 3 more sessions.  PT Home Exercise Plan  eval: seated HS stretch, bridge; 09/23/2018 - SLR flex, LAQ, sidelying clam, prone curl, STS.    Consulted and Agree with Plan of Care  Patient       Patient will benefit from skilled therapeutic intervention in order to improve the  following deficits and impairments:  Abnormal gait, Decreased activity tolerance, Decreased balance, Decreased endurance, Decreased range of motion, Decreased mobility, Decreased strength, Difficulty walking, Hypomobility, Increased fascial restricitons, Increased muscle spasms, Impaired flexibility, Improper body mechanics, Postural dysfunction, Pain  Visit Diagnosis: Chronic pain of left knee  Difficulty in walking, not elsewhere classified  Muscle weakness (generalized)  Unsteadiness on feet     Problem List Patient Active Problem List   Diagnosis Date Noted  . Chronic pain of left knee 08/25/2018  . Chronic pain of right knee 08/25/2018  . Urinary tract infection with hematuria 04/02/2018  . History of uterine cancer 04/01/2017  . Closed right hip fracture ([place]) 04/09/2016  . Hip fracture ([place]) 04/09/2016  . 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  . Endometrial cancer, grade I ([place]) 03/21/2015  . Well woman exam with routine gynecological exam 03/15/2014  . Screening for malignant neoplasm of the cervix 03/15/2014  . Hx of cancer of endometrium 03/06/2013   Teena Irani, PTA/CLT [phone]  Teena Irani 10/14/2018, 11:59 AM  [place] [address] [place], [place], [address] Phone: [phone]   Fax:  [phone]  Name: TANVEER DOBBERSTEIN MRN: [id number] Date of Birth: 10/18/1934

## 2018-10-16 ENCOUNTER — Ambulatory Visit (HOSPITAL_COMMUNITY): Payer: Medicare Other | Admitting: Physical Therapy

## 2018-10-16 DIAGNOSIS — R2681 Unsteadiness on feet: Secondary | ICD-10-CM | POA: Diagnosis not present

## 2018-10-16 DIAGNOSIS — M25562 Pain in left knee: Secondary | ICD-10-CM | POA: Diagnosis not present

## 2018-10-16 DIAGNOSIS — R262 Difficulty in walking, not elsewhere classified: Secondary | ICD-10-CM | POA: Diagnosis not present

## 2018-10-16 DIAGNOSIS — M6281 Muscle weakness (generalized): Secondary | ICD-10-CM | POA: Diagnosis not present

## 2018-10-16 DIAGNOSIS — G8929 Other chronic pain: Secondary | ICD-10-CM | POA: Diagnosis not present

## 2018-10-16 NOTE — Therapy (Signed)
[place], [place], [address] Phone: [phone]   Fax:  [phone]  Physical Therapy Treatment  Patient Details  Name: Brittany Mahoney MRN: [id number] Date of Birth: 09-19-1934 Referring Provider (PT): Jean Rosenthal, MD   Encounter Date: 10/16/2018  PT End of Session - 10/16/18 0926    Visit Number  9    Number of Visits  13    Date for PT Re-Evaluation  10/28/18   Minireassess completed 10/07/18   Authorization Type  Medicare (Secondary: BCBS Supplement)    Authorization Time Period  09/16/18 to 10/28/18    Authorization - Visit Number  9    Authorization - Number of Visits  13    PT Start Time  0818    PT Stop Time  0905    PT Time Calculation (min)  47 min    Activity Tolerance  Patient tolerated treatment well    Behavior During Therapy  [facility] for tasks assessed/performed       Past Medical History:  Diagnosis Date  . Broken hip ([place])   . Cancer ([place])    uterus  . Headache(784.0)    migraines after periods  . Hepatitis   . PMB (postmenopausal bleeding)     Past Surgical History:  Procedure Laterality Date  . ABDOMINAL HYSTERECTOMY     TAH and BSO  . DILATION AND CURETTAGE OF UTERUS    . INTRAMEDULLARY (IM) NAIL INTERTROCHANTERIC Right 04/10/2016   Procedure: INTRAMEDULLARY (IM) NAIL RIGHT HIP FRACTURE;  Surgeon: Mcarthur Rossetti, MD;  Location: [place];  Service: Orthopedics;  Laterality: Right;    There were no vitals filed for this visit.  Subjective Assessment - 10/16/18 0922    Subjective  Pt states she had a fall yesterday when she was transferring her clothes from the washer to the dryer.  States she has no idea how it happened, just ended up in the floor.      Currently in Pain?  No/denies                       [facility] Adult PT Treatment/Exercise - 10/16/18 0001      Knee/Hip Exercises: Machines for Strengthening   Total Gym Leg Press  30#, BLE, 2x15 reps      Knee/Hip Exercises: Standing   Heel Raises  10 reps    Forward Lunges  Both;10 reps;Limitations    Forward Lunges Limitations  onto 4" step without UE assist    Hip Abduction  Both;10 reps    Hip Extension  Both;10 reps    Other Standing Knee Exercises  Tandem stance 30"each      Knee/Hip Exercises: Seated   Long Arc Quad  Both;10 reps    Long Arc Quad Weight  3 lbs.      Knee/Hip Exercises: Supine   Short Arc Quad Sets  Both;15 reps;2 sets    Short Arc Quad Sets Limitations  3#    Bridges  Both;15 reps;2 sets    Straight Leg Raises  Both;15 reps;2 sets               PT Short Term Goals - 10/07/18 0816      PT SHORT TERM GOAL #1   Title  Pt will have improved MMT 4-/5 throughout all mm groups tested in order to reduce pain and maximize gait with LRAD.     Baseline  1/28: see MMT    Time  3    Period  Weeks    Status  Partially Met      PT SHORT TERM GOAL #2   Title  Pt will be able to perform bil SLS for 3 sec without UE support and with min to no unsteadiness in order to maximize gait and balance.     Baseline  1/28: 1 sec R, 2 sec L    Time  3    Period  Weeks    Status  On-going      PT SHORT TERM GOAL #3   Title  Pt will be able to perform 8 chair rises during [place] chair rise test to demo improved balance and functional strength.     Baseline  1/28: 4x, BUE on thighs    Time  3    Period  Weeks    Status  On-going        PT Long Term Goals - 10/07/18 1655      PT LONG TERM GOAL #1   Title  Pt will have improved MMT to 4/5 or better throughout all mm groups tested in order to further reduce pain and maximize functional mobility.     Baseline  1/28: see MMT    Time  6    Period  Weeks    Status  On-going      PT LONG TERM GOAL #2   Title  Pt will be able to perform bil SLS for 6sec or > without UE support and with min to no unsteadiness in order to further maximize gait and reduce her risk for falls.     Baseline  1/28: 1 sec R, 2 sec L    Time  6     Period  Weeks    Status  On-going      PT LONG TERM GOAL #3   Title  Pt will be able to ambulate 461f or > during 3MWT with LRAD and gait WFL in order to maximize her HH and limited community ambulation.     Baseline  1/28: 3087f RW, gait deviations conitnue    Time  6    Period  Weeks    Status  On-going      PT LONG TERM GOAL #4   Title  Pt will report being able to stand and walk for 10 mins or > with 3/10 L knee pain or < in order to allow her to prepare a small meal and perform light HH chores with greater ease.     Baseline  1/28: stands and walks for about 3 mins comfortably now    Time  6    Period  Weeks    Status  On-going            Plan - 10/16/18 [id number]  Clinical Impression Statement  Pt without any bruising or issues following her fall yesterday.  Able to complete SPC sequencing correctly, however prefers to use her RW at this point due to instability.  Prgressed with standing hip strengthening exercises and lunges in good form and without pain or crepitus.  Increased to 30# with leg press this session as well.      Rehab Potential  Fair    PT Frequency  2x / week    PT Duration  6 weeks    PT Treatment/Interventions  ADLs/Self Care Home Management;Aquatic Therapy;Cryotherapy;Electrical Stimulation;Moist Heat;Ultrasound;DME Instruction;Gait training;Stair training;Functional mobility training;Therapeutic activities;Therapeutic exercise;Balance training;Neuromuscular re-education;Patient/family education;Manual techniques;Passive range of motion;Dry needling;Energy conservation;Taping  PT Next Visit Plan  continue leg press machine; continue BLE, functional, core strenghtening and balance training, manual techniques for pain control; continue tandem balance and more weight bearing LE strengthening exercises as able.Marland Kitchen  Re-eval X 2 more sessions.    PT Home Exercise Plan  eval: seated HS stretch, bridge; 09/23/2018 - SLR flex, LAQ, sidelying clam, prone curl, STS.     Consulted and Agree with Plan of Care  Patient       Patient will benefit from skilled therapeutic intervention in order to improve the following deficits and impairments:  Abnormal gait, Decreased activity tolerance, Decreased balance, Decreased endurance, Decreased range of motion, Decreased mobility, Decreased strength, Difficulty walking, Hypomobility, Increased fascial restricitons, Increased muscle spasms, Impaired flexibility, Improper body mechanics, Postural dysfunction, Pain  Visit Diagnosis: Chronic pain of left knee  Difficulty in walking, not elsewhere classified  Muscle weakness (generalized)  Unsteadiness on feet     Problem List Patient Active Problem List   Diagnosis Date Noted  . Chronic pain of left knee 08/25/2018  . Chronic pain of right knee 08/25/2018  . Urinary tract infection with hematuria 04/02/2018  . History of uterine cancer 04/01/2017  . Closed right hip fracture ([place]) 04/09/2016  . Hip fracture ([place]) 04/09/2016  . 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  . Endometrial cancer, grade I ([place]) 03/21/2015  . Well woman exam with routine gynecological exam 03/15/2014  . Screening for malignant neoplasm of the cervix 03/15/2014  . Hx of cancer of endometrium 03/06/2013   Teena Irani, PTA/CLT [phone]  Teena Irani 10/16/2018, 9:33 AM  [facility] [address] [place], [place], [address] Phone: [phone]   Fax:  [phone]  Name: Brittany Mahoney MRN: [id number] Date of Birth: Jan 25, 1935

## 2018-10-21 ENCOUNTER — Encounter (HOSPITAL_COMMUNITY): Payer: Self-pay

## 2018-10-21 ENCOUNTER — Ambulatory Visit (HOSPITAL_COMMUNITY): Payer: Medicare Other

## 2018-10-21 DIAGNOSIS — R262 Difficulty in walking, not elsewhere classified: Secondary | ICD-10-CM | POA: Diagnosis not present

## 2018-10-21 DIAGNOSIS — M6281 Muscle weakness (generalized): Secondary | ICD-10-CM

## 2018-10-21 DIAGNOSIS — G8929 Other chronic pain: Secondary | ICD-10-CM | POA: Diagnosis not present

## 2018-10-21 DIAGNOSIS — R2681 Unsteadiness on feet: Secondary | ICD-10-CM | POA: Diagnosis not present

## 2018-10-21 DIAGNOSIS — M25562 Pain in left knee: Secondary | ICD-10-CM | POA: Diagnosis not present

## 2018-10-21 NOTE — Therapy (Signed)
[place], [place], [address] Phone: [phone]   Fax:  [phone]  Physical Therapy Treatment  Patient Details  Name: Brittany Mahoney MRN: [id number] Date of Birth: May 10, 1935 Referring Provider (PT): Jean Rosenthal, MD   Encounter Date: 10/21/2018  PT End of Session - 10/21/18 0813    Visit Number  10    Number of Visits  13    Date for PT Re-Evaluation  10/28/18   Minireassess completed 10/07/18   Authorization Type  Medicare (Secondary: BCBS Supplement)    Authorization Time Period  09/16/18 to 10/28/18    Authorization - Visit Number  10    Authorization - Number of Visits  13    PT Start Time  0814    PT Stop Time  0855    PT Time Calculation (min)  41 min    Activity Tolerance  Patient tolerated treatment well    Behavior During Therapy  [facility] for tasks assessed/performed       Past Medical History:  Diagnosis Date  . Broken hip ([place])   . Cancer ([place])    uterus  . Headache(784.0)    migraines after periods  . Hepatitis   . PMB (postmenopausal bleeding)     Past Surgical History:  Procedure Laterality Date  . ABDOMINAL HYSTERECTOMY     TAH and BSO  . DILATION AND CURETTAGE OF UTERUS    . INTRAMEDULLARY (IM) NAIL INTERTROCHANTERIC Right 04/10/2016   Procedure: INTRAMEDULLARY (IM) NAIL RIGHT HIP FRACTURE;  Surgeon: Mcarthur Rossetti, MD;  Location: [facility];  Service: Orthopedics;  Laterality: Right;    There were no vitals filed for this visit.  Subjective Assessment - 10/21/18 0814    Subjective  Pt states that the back of her L knee is really hurting today and she's not sure why.     Currently in Pain?  Yes    Pain Score  8     Pain Location  Knee    Pain Orientation  Left;Posterior    Pain Descriptors / Indicators  --   pulling   Pain Type  Chronic pain    Pain Onset  More than a month ago    Pain Frequency  Intermittent    Aggravating Factors   unsure    Pain Relieving Factors   unsure    Effect of Pain on Daily Activities  increase          [facility] Adult PT Treatment/Exercise - 10/21/18 0001      Knee/Hip Exercises: Stretches   Passive Hamstring Stretch  Left;3 reps;30 seconds    Passive Hamstring Stretch Limitations  standing, 12" step    Gastroc Stretch  Both;3 reps;30 seconds    Gastroc Stretch Limitations  slant board      Knee/Hip Exercises: Machines for Strengthening   Total Gym Leg Press  30#, BLE, x15 reps      Knee/Hip Exercises: Standing   Heel Raises  Both;15 reps    Heel Raises Limitations  heel and toe    Hip Abduction  Both;10 reps    Abduction Limitations  RTB    Forward Step Up  Both;10 reps;Step Height: 4";Hand Hold: 2      Balance Exercises - 10/21/18 0836      Balance Exercises: Standing   Tandem Stance  Eyes open;Intermittent upper extremity support;3 reps;10 secs    SLS  Eyes open;Solid surface;Intermittent upper extremity support;5 reps;10 secs    SLS with  Vectors  Solid surface;Upper extremity assist 1;3 reps   BLE with bil 1 fingertip assist   Tandem Gait  Forward;Intermittent upper extremity support;2 reps   in// bars          PT Education - 10/21/18 0814    Education Details  exercise technique, updated HEP    Person(s) Educated  Patient    Methods  Explanation;Demonstration;Handout    Comprehension  Verbalized understanding;Returned demonstration       PT Short Term Goals - 10/07/18 0816      PT SHORT TERM GOAL #1   Title  Pt will have improved MMT 4-/5 throughout all mm groups tested in order to reduce pain and maximize gait with LRAD.     Baseline  1/28: see MMT    Time  3    Period  Weeks    Status  Partially Met      PT SHORT TERM GOAL #2   Title  Pt will be able to perform bil SLS for 3 sec without UE support and with min to no unsteadiness in order to maximize gait and balance.     Baseline  1/28: 1 sec R, 2 sec L    Time  3    Period  Weeks    Status  On-going      PT SHORT TERM GOAL #3    Title  Pt will be able to perform 8 chair rises during [place] chair rise test to demo improved balance and functional strength.     Baseline  1/28: 4x, BUE on thighs    Time  3    Period  Weeks    Status  On-going        PT Long Term Goals - 10/07/18 1751      PT LONG TERM GOAL #1   Title  Pt will have improved MMT to 4/5 or better throughout all mm groups tested in order to further reduce pain and maximize functional mobility.     Baseline  1/28: see MMT    Time  6    Period  Weeks    Status  On-going      PT LONG TERM GOAL #2   Title  Pt will be able to perform bil SLS for 6sec or > without UE support and with min to no unsteadiness in order to further maximize gait and reduce her risk for falls.     Baseline  1/28: 1 sec R, 2 sec L    Time  6    Period  Weeks    Status  On-going      PT LONG TERM GOAL #3   Title  Pt will be able to ambulate 435f or > during 3MWT with LRAD and gait WFL in order to maximize her HH and limited community ambulation.     Baseline  1/28: 3068f RW, gait deviations conitnue    Time  6    Period  Weeks    Status  On-going      PT LONG TERM GOAL #4   Title  Pt will report being able to stand and walk for 10 mins or > with 3/10 L knee pain or < in order to allow her to prepare a small meal and perform light HH chores with greater ease.     Baseline  1/28: stands and walks for about 3 mins comfortably now    Time  6    Period  Weeks    Status  On-going            Plan - 10/21/18 1610    Clinical Impression Statement  Pt presenting to therapy with increased reports of L knee pain but this quickly reduced with stretching at beginning of session. Introduced pt to step ups and sidestepping for hip strengthening this date which she tolerated well. Continued with hip abd, leg press, and balance training this date; again, no reports of increased pain, just challenge with the balance. Pain reduced by EOS. Continue as planned, progressing as able.      Rehab Potential  Fair    PT Frequency  2x / week    PT Duration  6 weeks    PT Treatment/Interventions  ADLs/Self Care Home Management;Aquatic Therapy;Cryotherapy;Electrical Stimulation;Moist Heat;Ultrasound;DME Instruction;Gait training;Stair training;Functional mobility training;Therapeutic activities;Therapeutic exercise;Balance training;Neuromuscular re-education;Patient/family education;Manual techniques;Passive range of motion;Dry needling;Energy conservation;Taping    PT Next Visit Plan  continue leg press machine; continue BLE, functional, core strenghtening and balance training, manual techniques for pain control; continue tandem balance and more weight bearing LE strengthening exercises as able.    PT Home Exercise Plan  eval: seated HS stretch, bridge; 09/23/2018 - SLR flex, LAQ, sidelying clam, prone curl, STS; 2/11: hip abd RTB, heel and toe, tandem stance at counter    Consulted and Agree with Plan of Care  Patient       Patient will benefit from skilled therapeutic intervention in order to improve the following deficits and impairments:  Abnormal gait, Decreased activity tolerance, Decreased balance, Decreased endurance, Decreased range of motion, Decreased mobility, Decreased strength, Difficulty walking, Hypomobility, Increased fascial restricitons, Increased muscle spasms, Impaired flexibility, Improper body mechanics, Postural dysfunction, Pain  Visit Diagnosis: Chronic pain of left knee  Difficulty in walking, not elsewhere classified  Muscle weakness (generalized)  Unsteadiness on feet     Problem List Patient Active Problem List   Diagnosis Date Noted  . Chronic pain of left knee 08/25/2018  . Chronic pain of right knee 08/25/2018  . Urinary tract infection with hematuria 04/02/2018  . History of uterine cancer 04/01/2017  . Closed right hip fracture ([place]) 04/09/2016  . Hip fracture ([place]) 04/09/2016  . 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  . Endometrial cancer, grade I  ([place]) 03/21/2015  . Well woman exam with routine gynecological exam 03/15/2014  . Screening for malignant neoplasm of the cervix 03/15/2014  . Hx of cancer of endometrium 03/06/2013        Geraldine Solar PT, DPT   [facility] [address] [place], [place], [address] Phone: [phone]   Fax:  [phone]  Name: Brittany Mahoney MRN: [id number] Date of Birth: [birth year]

## 2018-10-23 ENCOUNTER — Ambulatory Visit (HOSPITAL_COMMUNITY): Payer: Medicare Other | Admitting: Physical Therapy

## 2018-10-23 DIAGNOSIS — G8929 Other chronic pain: Secondary | ICD-10-CM | POA: Diagnosis not present

## 2018-10-23 DIAGNOSIS — M25562 Pain in left knee: Principal | ICD-10-CM

## 2018-10-23 DIAGNOSIS — R262 Difficulty in walking, not elsewhere classified: Secondary | ICD-10-CM

## 2018-10-23 DIAGNOSIS — R2681 Unsteadiness on feet: Secondary | ICD-10-CM

## 2018-10-23 DIAGNOSIS — M6281 Muscle weakness (generalized): Secondary | ICD-10-CM

## 2018-10-23 NOTE — Therapy (Signed)
[place], [place], [address] Phone: [phone]   Fax:  [phone]  Physical Therapy Treatment  Patient Details  Name: Brittany Mahoney MRN: [id number] Date of Birth: [birth year] Referring Provider (PT): Jean Rosenthal, MD   Encounter Date: 10/23/2018  PT End of Session - 10/23/18 0913    Visit Number  11   progress note done on visit 6   Number of Visits  13    Date for PT Re-Evaluation  10/28/18   Minireassess completed 10/07/18   Authorization Type  Medicare (Secondary: BCBS Supplement)    Authorization Time Period  09/16/18 to 10/28/18    Authorization - Visit Number  11    Authorization - Number of Visits  13    PT Start Time  0820    PT Stop Time  0911    PT Time Calculation (min)  51 min    Activity Tolerance  Patient tolerated treatment well    Behavior During Therapy  [facility] for tasks assessed/performed       Past Medical History:  Diagnosis Date  . Broken hip ([place])   . Cancer ([place])    uterus  . Headache(784.0)    migraines after periods  . Hepatitis   . PMB (postmenopausal bleeding)     Past Surgical History:  Procedure Laterality Date  . ABDOMINAL HYSTERECTOMY     TAH and BSO  . DILATION AND CURETTAGE OF UTERUS    . INTRAMEDULLARY (IM) NAIL INTERTROCHANTERIC Right 04/10/2016   Procedure: INTRAMEDULLARY (IM) NAIL RIGHT HIP FRACTURE;  Surgeon: Mcarthur Rossetti, MD;  Location: [facility];  Service: Orthopedics;  Laterality: Right;    There were no vitals filed for this visit.  Subjective Assessment - 10/23/18 0820    Subjective  Pt states that the back of her L knee is really hurting today and she's not sure why.     Currently in Pain?  Yes    Pain Score  4     Pain Location  Knee    Pain Orientation  Left    Pain Type  Chronic pain    Pain Onset  More than a month ago    Pain Frequency  Intermittent    Aggravating Factors   activity     Pain Relieving Factors  IP     Effect of Pain  on Daily Activities  limits                        [facility] Adult PT Treatment/Exercise - 10/23/18 0001      Ambulation/Gait   Ambulation Distance (Feet)  200 Feet    Assistive device  Large base quad cane    Gait Pattern  Step-through pattern      Knee/Hip Exercises: Stretches   Other Knee/Hip Stretches  band above knees B hip abduction/extension x 15 reps each.       Knee/Hip Exercises: Machines for Strengthening   Total Gym Leg Press  20#, BLE, x15 reps      Knee/Hip Exercises: Standing   Side Lunges  Left;5 reps    Hip Abduction  Both;15 reps    Abduction Limitations  RTB    Forward Step Up  Both;10 reps;Step Height: 4";Hand Hold: 2      Knee/Hip Exercises: Seated   Long Arc Quad  Both;10 reps    Long Arc Quad Weight  4 lbs.   hold x 5 seconds  Balance Exercises - 10/23/18 0838      Balance Exercises: Standing   Tandem Stance  Eyes open;Intermittent upper extremity support;3 reps;10 secs    Tandem Gait  Forward;Intermittent upper extremity support;2 reps   in// bars   Marching Limitations  5    Sit to Stand Time  10 at 25 1/2 " height for reduced pain          PT Short Term Goals - 10/07/18 0816      PT SHORT TERM GOAL #1   Title  Pt will have improved MMT 4-/5 throughout all mm groups tested in order to reduce pain and maximize gait with LRAD.     Baseline  1/28: see MMT    Time  3    Period  Weeks    Status  Partially Met      PT SHORT TERM GOAL #2   Title  Pt will be able to perform bil SLS for 3 sec without UE support and with min to no unsteadiness in order to maximize gait and balance.     Baseline  1/28: 1 sec R, 2 sec L    Time  3    Period  Weeks    Status  On-going      PT SHORT TERM GOAL #3   Title  Pt will be able to perform 8 chair rises during [place] chair rise test to demo improved balance and functional strength.     Baseline  1/28: 4x, BUE on thighs    Time  3    Period  Weeks    Status  On-going         PT Long Term Goals - 10/07/18 2706      PT LONG TERM GOAL #1   Title  Pt will have improved MMT to 4/5 or better throughout all mm groups tested in order to further reduce pain and maximize functional mobility.     Baseline  1/28: see MMT    Time  6    Period  Weeks    Status  On-going      PT LONG TERM GOAL #2   Title  Pt will be able to perform bil SLS for 6sec or > without UE support and with min to no unsteadiness in order to further maximize gait and reduce her risk for falls.     Baseline  1/28: 1 sec R, 2 sec L    Time  6    Period  Weeks    Status  On-going      PT LONG TERM GOAL #3   Title  Pt will be able to ambulate 477f or > during 3MWT with LRAD and gait WFL in order to maximize her HH and limited community ambulation.     Baseline  1/28: 3025f RW, gait deviations conitnue    Time  6    Period  Weeks    Status  On-going      PT LONG TERM GOAL #4   Title  Pt will report being able to stand and walk for 10 mins or > with 3/10 L knee pain or < in order to allow her to prepare a small meal and perform light HH chores with greater ease.     Baseline  1/28: stands and walks for about 3 mins comfortably now    Time  6    Period  Weeks    Status  On-going  Plan - 10/23/18 0914    Clinical Impression Statement  PT continues to have knee pain.  Exercises modified to reduce pain including sit to stands at mat table where it can be raised 25.5" , Began gait training with large quad cane with no noted increased pain.  PT continues to fatigue after therapy.     Rehab Potential  Fair    PT Frequency  2x / week    PT Duration  6 weeks    PT Treatment/Interventions  ADLs/Self Care Home Management;Aquatic Therapy;Cryotherapy;Electrical Stimulation;Moist Heat;Ultrasound;DME Instruction;Gait training;Stair training;Functional mobility training;Therapeutic activities;Therapeutic exercise;Balance training;Neuromuscular re-education;Patient/family education;Manual  techniques;Passive range of motion;Dry needling;Energy conservation;Taping    PT Next Visit Plan  continue leg press machine; continue BLE, functional, core strenghtening and balance training, manual techniques for pain control; continue tandem balance and more weight bearing LE strengthening exercises as able.    PT Home Exercise Plan  eval: seated HS stretch, bridge; 09/23/2018 - SLR flex, LAQ, sidelying clam, prone curl, STS; 2/11: hip abd RTB, heel and toe, tandem stance at counter    Consulted and Agree with Plan of Care  Patient       Patient will benefit from skilled therapeutic intervention in order to improve the following deficits and impairments:  Abnormal gait, Decreased activity tolerance, Decreased balance, Decreased endurance, Decreased range of motion, Decreased mobility, Decreased strength, Difficulty walking, Hypomobility, Increased fascial restricitons, Increased muscle spasms, Impaired flexibility, Improper body mechanics, Postural dysfunction, Pain  Visit Diagnosis: Chronic pain of left knee  Difficulty in walking, not elsewhere classified  Muscle weakness (generalized)  Unsteadiness on feet     Problem List Patient Active Problem List   Diagnosis Date Noted  . Chronic pain of left knee 08/25/2018  . Chronic pain of right knee 08/25/2018  . Urinary tract infection with hematuria 04/02/2018  . History of uterine cancer 04/01/2017  . Closed right hip fracture ([place]) 04/09/2016  . Hip fracture ([place]) 04/09/2016  . 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  . Endometrial cancer, grade I ([place]) 03/21/2015  . Well woman exam with routine gynecological exam 03/15/2014  . Screening for malignant neoplasm of the cervix 03/15/2014  . Hx of cancer of endometrium 03/06/2013   Rayetta Humphrey, PT CLT [phone] 10/23/2018, 9:16 AM  [facility] [address] [place], [place], [address] Phone: [phone]   Fax:  [phone]  Name: DONNAH LEVERT MRN: [id number] Date of Birth: 1934/11/02

## 2018-10-28 ENCOUNTER — Ambulatory Visit (HOSPITAL_COMMUNITY): Payer: Medicare Other | Admitting: Physical Therapy

## 2018-10-28 ENCOUNTER — Encounter (HOSPITAL_COMMUNITY): Payer: Self-pay | Admitting: Physical Therapy

## 2018-10-28 DIAGNOSIS — G8929 Other chronic pain: Secondary | ICD-10-CM

## 2018-10-28 DIAGNOSIS — R296 Repeated falls: Secondary | ICD-10-CM | POA: Diagnosis not present

## 2018-10-28 DIAGNOSIS — M25562 Pain in left knee: Secondary | ICD-10-CM | POA: Diagnosis not present

## 2018-10-28 DIAGNOSIS — R2689 Other abnormalities of gait and mobility: Secondary | ICD-10-CM | POA: Diagnosis not present

## 2018-10-28 DIAGNOSIS — R262 Difficulty in walking, not elsewhere classified: Secondary | ICD-10-CM

## 2018-10-28 DIAGNOSIS — M6281 Muscle weakness (generalized): Secondary | ICD-10-CM | POA: Diagnosis not present

## 2018-10-28 DIAGNOSIS — M810 Age-related osteoporosis without current pathological fracture: Secondary | ICD-10-CM | POA: Diagnosis not present

## 2018-10-28 DIAGNOSIS — R2681 Unsteadiness on feet: Secondary | ICD-10-CM | POA: Diagnosis not present

## 2018-10-28 DIAGNOSIS — R251 Tremor, unspecified: Secondary | ICD-10-CM | POA: Diagnosis not present

## 2018-10-28 NOTE — Therapy (Signed)
[place], [place], [address] Phone: [phone]   Fax:  [phone]  Physical Therapy Treatment  Patient Details  Name: Brittany Mahoney MRN: [id number] Date of Birth: 04/14/1935 Referring Provider (PT): Jean Rosenthal, MD   Encounter Date: 10/28/2018  PHYSICAL THERAPY DISCHARGE SUMMARY  Visits from Start of Care: 12  Current functional level related to goals / functional outcomes: See below    Remaining deficits: See below    Education / Equipment: HEP, the importance of walking, improving her glut strength and improving her balance.  Plan: Patient agrees to discharge.  Patient goals were not met. Patient is being discharged due to meeting the stated rehab goals.  ?????     PT End of Session - 10/28/18 0818    Visit Number  12   progress note done on visit 6   Number of Visits  12    Date for PT Re-Evaluation  10/28/18   Minireassess completed 10/07/18   Authorization Type  Medicare (Secondary: BCBS Supplement)    Authorization Time Period  09/16/18 to 10/28/18    Authorization - Visit Number  12    Authorization - Number of Visits  12    PT Start Time  0815    PT Stop Time  0900    PT Time Calculation (min)  45 min    Activity Tolerance  Patient tolerated treatment well    Behavior During Therapy  [facility] for tasks assessed/performed       Past Medical History:  Diagnosis Date  . Broken hip ([place])   . Cancer ([place])    uterus  . Headache(784.0)    migraines after periods  . Hepatitis   . PMB (postmenopausal bleeding)     Past Surgical History:  Procedure Laterality Date  . ABDOMINAL HYSTERECTOMY     TAH and BSO  . DILATION AND CURETTAGE OF UTERUS    . INTRAMEDULLARY (IM) NAIL INTERTROCHANTERIC Right 04/10/2016   Procedure: INTRAMEDULLARY (IM) NAIL RIGHT HIP FRACTURE;  Surgeon: Mcarthur Rossetti, MD;  Location: [facility];  Service: Orthopedics;  Laterality: Right;    There were no vitals  filed for this visit.  Subjective Assessment - 10/28/18 0813    Subjective  PT states that her knee is always bothering her.  States that she is doing her exercises.  States that it has been 6 weeks and her daughter is no longer going to be able to bring her to therapy.    How long can you sit comfortably?  Makes herself get up every 30 minutes due to knowing sitting is bad for her     How long can you stand comfortably?  At least an hour     How long can you walk comfortably?  With a walker able to walk for 45 minutes     Currently in Pain?  Yes    Pain Score  3     Pain Location  Knee    Pain Orientation  Left;Posterior    Pain Descriptors / Indicators  Other (Comment)    Pain Type  Chronic pain    Pain Onset  More than a month ago    Pain Frequency  Intermittent    Aggravating Factors   activity     Pain Relieving Factors  rest     Effect of Pain on Daily Activities  limits          [facility] PT Assessment - 10/28/18 0001  Assessment   Medical Diagnosis  Chronic pain of Left knee    Referring Provider (PT)  Jean Rosenthal, MD    Onset Date/Surgical Date  --   62YA, maybe longer   Next MD Visit  nothing scheduled until after PT    Prior Therapy  yes      Observation/Other Assessments   Focus on Therapeutic Outcomes (FOTO)   45% limited   was 55% limited     Functional Tests   Functional tests  Sit to Stand      Sit to Stand   Comments   50.53 with hands x 5 reps    was 4x, BUE support     AROM   Left Knee Extension  0    Left Knee Flexion  108      Strength   Right Hip Flexion  4+/5   was 4   Right Hip Extension  3+/5   was 3+   Right Hip ABduction  4+/5   was 3+   Left Hip Flexion  5/5   was 4+   Left Hip Extension  3+/5   was 3   Left Hip ABduction  4+/5   was 3+   Right Knee Flexion  3+/5   was 3+   Right Knee Extension  5/5   was 4+   Left Knee Flexion  4-/5   was 3+   Left Knee Extension  5/5   was 4+   Right Ankle Dorsiflexion  5/5    wa 4+   Left Ankle Dorsiflexion  5/5   was 4+     Ambulation/Gait   Ambulation Distance (Feet)  254 Feet   3MWT; was 275f with RW   Assistive device  Rolling walker    Gait Pattern  Step-through pattern;Decreased dorsiflexion - right;Decreased dorsiflexion - left;Trendelenburg;Antalgic;Trunk flexed      Balance   Balance Assessed  Yes      Static Standing Balance   Static Standing - Balance Support  No upper extremity supported    Static Standing Balance -  Activities   Single Leg Stance - Right Leg;Single Leg Stance - Left Leg    Static Standing - Comment/# of Minutes  RT : 5" ;LT 7"   was 2" Lt; 1 " RT                   [facility] Adult PT Treatment/Exercise - 10/28/18 0001      Exercises   Exercises  Knee/Hip      Knee/Hip Exercises: Prone   Hamstring Curl  10 reps;Limitations    Hamstring Curl Limitations  B with red tband     Hip Extension  Both;10 reps          Balance Exercises - 10/28/18 0920      Balance Exercises: Standing   Tandem Stance  Eyes open;Intermittent upper extremity support;3 reps;10 secs    Sit to Stand Time  5        PT Education - 10/28/18 0923    Education Details  PT educated on  the importance of walking as well as the importance of walking tall everyday with the goal to be walking 30 minutes 5x a week.  Pt educated on the importance of everyone over 60 to be working on improving their balance everyday for five minutes.      Person(s) Educated  Patient    Methods  Explanation    Comprehension  Verbalized understanding  PT Short Term Goals - 10/28/18 0929      PT SHORT TERM GOAL #1   Title  Pt will have improved MMT 4-/5 throughout all mm groups tested in order to reduce pain and maximize gait with LRAD.     Baseline  1/28, 2/18: see MMT    Time  3    Period  Weeks    Status  Partially Met      PT SHORT TERM GOAL #2   Title  Pt will be able to perform bil SLS for 3 sec without UE support and with min to no  unsteadiness in order to maximize gait and balance.     Baseline  2/18:  LT:  7", Rt 5    Time  3    Period  Weeks    Status  Achieved      PT SHORT TERM GOAL #3   Title  Pt will be able to perform 8 chair rises during [place] chair rise test to demo improved balance and functional strength.     Baseline  see eval     Time  3    Period  Weeks    Status  On-going        PT Long Term Goals - 10/28/18 0931      PT LONG TERM GOAL #1   Title  Pt will have improved MMT to 4/5 or better throughout all mm groups tested in order to further reduce pain and maximize functional mobility.     Baseline  1/28, 2/18: see MMT    Time  6    Period  Weeks    Status  Partially Met      PT LONG TERM GOAL #2   Title  Pt will be able to perform bil SLS for 6sec or > without UE support and with min to no unsteadiness in order to further maximize gait and reduce her risk for falls.     Baseline  2/18:  Lt 7", Rt 5"    Time  6    Period  Weeks    Status  Partially Met      PT LONG TERM GOAL #3   Title  Pt will be able to ambulate 466f or > during 3MWT with LRAD and gait WFL in order to maximize her HH and limited community ambulation.     Baseline  2/18:  improved posture but only able to ambulate today 2546fin 3' with rolling walking     Time  6    Period  Weeks    Status  Not Met      PT LONG TERM GOAL #4   Title  Pt will report being able to stand and walk for 10 mins or > with 3/10 L knee pain or < in order to allow her to prepare a small meal and perform light HH chores with greater ease.     Time  6    Period  Weeks    Status  Achieved            Plan - 10/28/18 0925    Clinical Impression Statement  Pt states that her daughter works and has been going in late in order to take her to her therapy appointments.  PT states that her daughter recieved permission to come in late for six weeks and this is over therefore she needs to have therapy completed today.  Therapist suggested [facility],  however, at this point Ms. MeReather Laurence  would like to try it on her own.  Pt reassessed with noted improvements but continues to have strength and balance deficits.  Therapist urged pt to work on HEP at home but if she is not improving to find another sourse of transportation to bring her to formal therapy as she still has some significant deficits that are limiting her functional level.     Clinical Presentation  Stable    Rehab Potential  Fair    PT Frequency  2x / week    PT Duration  6 weeks    PT Treatment/Interventions  ADLs/Self Care Home Management;Aquatic Therapy;Cryotherapy;Electrical Stimulation;Moist Heat;Ultrasound;DME Instruction;Gait training;Stair training;Functional mobility training;Therapeutic activities;Therapeutic exercise;Balance training;Neuromuscular re-education;Patient/family education;Manual techniques;Passive range of motion;Dry needling;Energy conservation;Taping    PT Next Visit Plan  Discharge per pt reques.     PT Home Exercise Plan  eval: seated HS stretch, bridge; 09/23/2018 - SLR flex, LAQ, sidelying clam, prone curl, STS; 2/11: hip abd RTB, heel and toe, tandem stance at counter    Consulted and Agree with Plan of Care  Patient       Patient will benefit from skilled therapeutic intervention in order to improve the following deficits and impairments:  Abnormal gait, Decreased activity tolerance, Decreased balance, Decreased endurance, Decreased range of motion, Decreased mobility, Decreased strength, Difficulty walking, Hypomobility, Increased fascial restricitons, Increased muscle spasms, Impaired flexibility, Improper body mechanics, Postural dysfunction, Pain  Visit Diagnosis: Chronic pain of left knee  Difficulty in walking, not elsewhere classified  Muscle weakness (generalized)  Unsteadiness on feet     Problem List Patient Active Problem List   Diagnosis Date Noted  . Chronic pain of left knee 08/25/2018  . Chronic pain of right knee 08/25/2018  .  Urinary tract infection with hematuria 04/02/2018  . History of uterine cancer 04/01/2017  . Closed right hip fracture ([place]) 04/09/2016  . Hip fracture ([place]) 04/09/2016  . 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  . Endometrial cancer, grade I ([place]) 03/21/2015  . Well woman exam with routine gynecological exam 03/15/2014  . Screening for malignant neoplasm of the cervix 03/15/2014  . Hx of cancer of endometrium 03/06/2013    Rayetta Humphrey, PT CLT [phone] 10/28/2018, 9:36 AM  [facility] [address] [place], [place], [address] Phone: [phone]   Fax:  [phone]  Name: CHICQUITA MENDEL MRN: [id number] Date of Birth: 1935/09/10

## 2018-10-28 NOTE — Patient Instructions (Addendum)
Self-Mobilization: Knee Flexion (Prone)   Place theraband around your feet.  NOw  Bring right heel toward buttocks as close as possible. Hold 3____ seconds. Relax. Repeat __10-15__ times per set. Do __1__ sets per session. Do _2___ sessions per day. Repeat to the left   http://orth.exer.us/596   Copyright  VHI. All rights reserved.  Strengthening: Hip Extension (Prone)    Tighten muscles on front of left thigh, then lift leg _2___ inches from surface, keeping knee locked. Repeat to the right  Repeat 10____ times per set. Do __1__ sets per session. Do __2__ sessions per day.  http://orth.exer.us/620   Copyright  VHI. All rights reserved.  Feet Heel-Toe "Tandem", Head Motion - Eyes Open    With eyes open, right foot directly in front of the other,Initally get to where you can do this for 30 second.   Then  move head slowly: up and down. Repeat _3___ times per session. Do _2___ sessions per day.  Copyright  VHI. All rights reserved.

## 2019-04-10 ENCOUNTER — Other Ambulatory Visit: Payer: Self-pay

## 2019-04-20 DIAGNOSIS — R2689 Other abnormalities of gait and mobility: Secondary | ICD-10-CM | POA: Diagnosis not present

## 2019-04-20 DIAGNOSIS — Z8781 Personal history of (healed) traumatic fracture: Secondary | ICD-10-CM | POA: Diagnosis not present

## 2019-04-20 DIAGNOSIS — M79671 Pain in right foot: Secondary | ICD-10-CM | POA: Diagnosis not present

## 2019-04-20 DIAGNOSIS — R413 Other amnesia: Secondary | ICD-10-CM | POA: Diagnosis not present

## 2019-04-20 DIAGNOSIS — N181 Chronic kidney disease, stage 1: Secondary | ICD-10-CM | POA: Diagnosis not present

## 2019-04-20 DIAGNOSIS — M810 Age-related osteoporosis without current pathological fracture: Secondary | ICD-10-CM | POA: Diagnosis not present

## 2019-04-20 DIAGNOSIS — M25511 Pain in right shoulder: Secondary | ICD-10-CM | POA: Diagnosis not present

## 2019-04-20 DIAGNOSIS — Z79899 Other long term (current) drug therapy: Secondary | ICD-10-CM | POA: Diagnosis not present

## 2019-04-20 DIAGNOSIS — M25562 Pain in left knee: Secondary | ICD-10-CM | POA: Diagnosis not present

## 2019-04-20 DIAGNOSIS — M129 Arthropathy, unspecified: Secondary | ICD-10-CM | POA: Diagnosis not present

## 2019-04-20 DIAGNOSIS — R296 Repeated falls: Secondary | ICD-10-CM | POA: Diagnosis not present

## 2019-04-20 LAB — CBC AND DIFFERENTIAL
HCT: 45 (ref 36–46)
Hemoglobin: 15.3 (ref 12.0–16.0)
Neutrophils Absolute: 5
Platelets: 318 (ref 150–399)
WBC: 8

## 2019-04-20 LAB — BASIC METABOLIC PANEL
BUN: 12 (ref 4–21)
CO2: 25 — AB (ref 13–22)
Chloride: 105 (ref 99–108)
Creatinine: 1 (ref <=1.1)
Glucose: 88
Potassium: 4.2 (ref 3.4–5.3)
Sodium: 143 (ref 137–147)

## 2019-04-20 LAB — COMPREHENSIVE METABOLIC PANEL
Albumin: 4.1 (ref 3.5–5.0)
Calcium: 9.7 (ref 8.7–10.7)
GFR calc Af Amer: 58
GFR calc non Af Amer: 50

## 2019-04-20 LAB — CBC: RBC: 4.92 (ref 3.87–5.11)

## 2019-04-20 LAB — LIPID PANEL
Cholesterol: 121 (ref 0–200)
HDL: 34 — AB (ref 35–70)
LDL Cholesterol: 68
Triglycerides: 94 (ref 40–160)

## 2019-04-20 LAB — HEPATIC FUNCTION PANEL
ALT: 14 (ref 7–35)
AST: 10 — AB (ref 13–35)
Alkaline Phosphatase: 88 (ref 25–125)

## 2019-04-20 LAB — TSH: TSH: 2.35 (ref <=5.90)

## 2019-04-22 DIAGNOSIS — M810 Age-related osteoporosis without current pathological fracture: Secondary | ICD-10-CM | POA: Diagnosis not present

## 2019-04-22 DIAGNOSIS — N181 Chronic kidney disease, stage 1: Secondary | ICD-10-CM | POA: Diagnosis not present

## 2019-04-22 DIAGNOSIS — Z8781 Personal history of (healed) traumatic fracture: Secondary | ICD-10-CM | POA: Diagnosis not present

## 2019-05-14 ENCOUNTER — Other Ambulatory Visit (HOSPITAL_COMMUNITY): Payer: Self-pay | Admitting: Pulmonary Disease

## 2019-05-14 DIAGNOSIS — Z1231 Encounter for screening mammogram for malignant neoplasm of breast: Secondary | ICD-10-CM

## 2019-05-27 ENCOUNTER — Ambulatory Visit (HOSPITAL_COMMUNITY): Payer: Medicare Other

## 2019-06-08 ENCOUNTER — Ambulatory Visit (HOSPITAL_COMMUNITY)
Admission: RE | Admit: 2019-06-08 | Discharge: 2019-06-08 | Disposition: A | Discharge status: Home / Self Care | Payer: Medicare Other | Source: Ambulatory Visit | Attending: Pulmonary Disease | Admitting: Pulmonary Disease

## 2019-06-08 ENCOUNTER — Encounter (HOSPITAL_COMMUNITY): Payer: Self-pay

## 2019-06-08 ENCOUNTER — Other Ambulatory Visit: Payer: Self-pay

## 2019-06-08 DIAGNOSIS — Z1231 Encounter for screening mammogram for malignant neoplasm of breast: Secondary | ICD-10-CM | POA: Diagnosis not present

## 2019-06-08 LAB — HM MAMMOGRAPHY

## 2019-07-11 DIAGNOSIS — Z23 Encounter for immunization: Secondary | ICD-10-CM | POA: Diagnosis not present

## 2019-08-08 DIAGNOSIS — M25562 Pain in left knee: Secondary | ICD-10-CM

## 2019-08-08 DIAGNOSIS — R2689 Other abnormalities of gait and mobility: Secondary | ICD-10-CM

## 2019-08-08 DIAGNOSIS — M129 Arthropathy, unspecified: Secondary | ICD-10-CM

## 2019-08-08 DIAGNOSIS — M810 Age-related osteoporosis without current pathological fracture: Secondary | ICD-10-CM

## 2019-08-08 DIAGNOSIS — S72001S Fracture of unspecified part of neck of right femur, sequela: Secondary | ICD-10-CM

## 2019-08-08 DIAGNOSIS — Z8781 Personal history of (healed) traumatic fracture: Secondary | ICD-10-CM

## 2019-08-08 DIAGNOSIS — M25511 Pain in right shoulder: Secondary | ICD-10-CM

## 2019-08-08 DIAGNOSIS — N181 Chronic kidney disease, stage 1: Secondary | ICD-10-CM

## 2019-08-08 DIAGNOSIS — R296 Repeated falls: Secondary | ICD-10-CM

## 2019-08-08 DIAGNOSIS — R251 Tremor, unspecified: Secondary | ICD-10-CM

## 2019-08-08 DIAGNOSIS — M79671 Pain in right foot: Secondary | ICD-10-CM

## 2019-08-08 DIAGNOSIS — F0390 Unspecified dementia without behavioral disturbance: Secondary | ICD-10-CM | POA: Insufficient documentation

## 2019-08-08 DIAGNOSIS — R413 Other amnesia: Secondary | ICD-10-CM

## 2019-10-26 ENCOUNTER — Encounter: Payer: Self-pay | Admitting: Family Medicine

## 2019-10-26 ENCOUNTER — Ambulatory Visit (INDEPENDENT_AMBULATORY_CARE_PROVIDER_SITE_OTHER): Payer: Medicare Other | Admitting: Family Medicine

## 2019-10-26 ENCOUNTER — Other Ambulatory Visit: Payer: Self-pay

## 2019-10-26 VITALS — BP 153/87 | HR 75 | Temp 97.5°F | Ht 66.6 in | Wt 162.4 lb

## 2019-10-26 DIAGNOSIS — R300 Dysuria: Secondary | ICD-10-CM | POA: Diagnosis not present

## 2019-10-26 DIAGNOSIS — N390 Urinary tract infection, site not specified: Secondary | ICD-10-CM

## 2019-10-26 DIAGNOSIS — N904 Leukoplakia of vulva: Secondary | ICD-10-CM

## 2019-10-26 DIAGNOSIS — M21961 Unspecified acquired deformity of right lower leg: Secondary | ICD-10-CM

## 2019-10-26 DIAGNOSIS — N181 Chronic kidney disease, stage 1: Secondary | ICD-10-CM

## 2019-10-26 DIAGNOSIS — R251 Tremor, unspecified: Secondary | ICD-10-CM

## 2019-10-26 DIAGNOSIS — M810 Age-related osteoporosis without current pathological fracture: Secondary | ICD-10-CM | POA: Diagnosis not present

## 2019-10-26 DIAGNOSIS — M21962 Unspecified acquired deformity of left lower leg: Secondary | ICD-10-CM | POA: Diagnosis not present

## 2019-10-26 DIAGNOSIS — M25562 Pain in left knee: Secondary | ICD-10-CM | POA: Diagnosis not present

## 2019-10-26 DIAGNOSIS — R2689 Other abnormalities of gait and mobility: Secondary | ICD-10-CM | POA: Diagnosis not present

## 2019-10-26 DIAGNOSIS — G8929 Other chronic pain: Secondary | ICD-10-CM

## 2019-10-26 LAB — POCT URINALYSIS DIP (CLINITEK)
Bilirubin, UA: NEGATIVE
Glucose, UA: NEGATIVE mg/dL
Ketones, POC UA: NEGATIVE mg/dL
Nitrite, UA: POSITIVE — AB
POC PROTEIN,UA: 30 — AB
Spec Grav, UA: 1.025 (ref 1.010–1.025)
Urobilinogen, UA: 0.2 E.U./dL
pH, UA: 6 (ref 5.0–8.0)

## 2019-10-26 MED ORDER — SULFAMETHOXAZOLE-TRIMETHOPRIM 800-160 MG PO TABS: 1.0000 | ORAL_TABLET | Freq: Two times a day (BID) | ORAL | 0 refills | Status: DC

## 2019-10-26 MED ORDER — ALENDRONATE SODIUM 70 MG PO TABS: 70.0000 mg | ORAL_TABLET | ORAL | 4 refills | Status: DC

## 2019-10-26 NOTE — Progress Notes (Signed)
New Patient Office Visit  Subjective:  Patient ID: Brittany Mahoney, female    DOB: 05-20-1935  Age: 85 y.o. MRN: [id number]  CC:  Chief Complaint  Patient presents with  . Establish Care  . right heel discolored  . Memory Loss  Neurology-[facility]-note reviewed You have a rather mild intermittent tremor of both hands.  I do not see any signs or symptoms of parkinson's like disease or what we call parkinsonism.  For your tremor, I would not recommend any new medication for fear of side effects (especially sleepiness) or medication interactions, especially in light of your balance issues.  Please remember, that any kind of tremor may be exacerbated by anxiety, anger, nervousness, excitement, dehydration, sleep deprivation, by caffeine, and low blood sugar values or blood sugar fluctuations.  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 Swelling noted in feet and ankle  Pt with urinary frequency noted over the past 6 weeks ago-leakage-painful-no blood in urine. No stomach or back pain. No fever  122/80-bp at home Past Medical History:  Diagnosis Date  . Age-related osteoporosis without current pathological fracture   . Arthritis   . Arthropathy, unspecified   . Broken hip ([place])   . Cancer ([place])    uterus  . Chronic kidney disease, stage 1   . Fracture of unspecified part of neck of right femur, sequela   . Glaucoma   . Headache(784.0)    migraines after periods  . Hepatitis   . Other abnormalities of gait and mobility   . Other amnesia   . Pain in left knee   . Pain in right foot   . Pain in right shoulder   . Personal history of (healed) traumatic fracture   . PMB (postmenopausal bleeding)   . Repeated falls   . Tremor, unspecified     Past Surgical History:  Procedure Laterality Date  . ABDOMINAL HYSTERECTOMY     TAH and BSO  . COLONOSCOPY  07/31/2006    Dr.Rourk  . DILATION AND CURETTAGE OF UTERUS    . HIP FRACTURE SURGERY    . INTRAMEDULLARY (IM) NAIL INTERTROCHANTERIC Right 04/10/2016   Procedure: INTRAMEDULLARY (IM) NAIL RIGHT HIP FRACTURE;  Surgeon: Mcarthur Rossetti, MD;  Location: [facility];  Service: Orthopedics;  Laterality: Right;    Family History  Problem Relation Age of Onset  . Stroke Maternal Grandfather   . Stroke Paternal Grandfather   . Cancer Mother   . Cancer Father        lymphatic sarcoma  . Early death Sister   . Early death Son   . Diabetes Son   . Heart disease Other   . Cancer Other   . Tuberculosis Other   . Diabetes Other   . Thyroid disease Other   . Stroke Other   . Heart disease Maternal Aunt   . Heart disease Maternal Uncle     Social History   Socioeconomic History  . Marital status: Widowed    Spouse name: Not on file  . Number of children: Not on file  . Years of education: Not on file  . Highest education level: Not on file  Occupational History  . Not on file  Tobacco Use  . Smoking status: Never Smoker  . Smokeless tobacco: Never Used  Substance and Sexual Activity  . Alcohol use: No  . Drug use: No  . Sexual activity: Not Currently  Birth control/protection: Surgical  Other Topics Concern  . Not on file  Social History Narrative  . Not on file   Social Determinants of Health   Financial Resource Strain:   . Difficulty of Paying Living Expenses: Not on file  Food Insecurity:   . Worried About Charity fundraiser in the Last Year: Not on file  . Ran Out of Food in the Last Year: Not on file  Transportation Needs:   . Lack of Transportation (Medical): Not on file  . Lack of Transportation (Non-Medical): Not on file  Physical Activity:   . Days of Exercise per Week: Not on file  . Minutes of Exercise per Session: Not on file  Stress:   . Feeling of Stress : Not on file  Social Connections:   . Frequency of Communication with Friends and Family: Not on file  .  Frequency of Social Gatherings with Friends and Family: Not on file  . Attends Religious Services: Not on file  . Active Member of Clubs or Organizations: Not on file  . Attends Archivist Meetings: Not on file  . Marital Status: Not on file  Intimate Partner Violence:   . Fear of Current or Ex-Partner: Not on file  . Emotionally Abused: Not on file  . Physically Abused: Not on file  . Sexually Abused: Not on file    ROS Review of Systems  Constitutional: Negative.   HENT: Positive for sinus pressure.   Eyes:       Glasses-glaucoma-Dr. Carter-drops  Respiratory: Negative.   Endocrine: Negative.   Genitourinary: Positive for difficulty urinating.       Urinary incontinence  Musculoskeletal: Positive for arthralgias, gait problem and joint swelling.       Osteoporosis Left knee swelling-injury in the past rollerskating  Skin: Positive for color change.  Allergic/Immunologic: Negative.   Neurological: Positive for tremors.  Hematological: Negative.   Psychiatric/Behavioral: Negative.     Objective:   Today's Vitals: BP (!) 153/87 (BP Location: Left Arm, Patient Position: Sitting)   Pulse 75   Temp (!) 97.5 F (36.4 C) (Oral)   Ht 5' 6.6" (1.692 m)   Wt 162 lb 6.4 oz (73.7 kg)   SpO2 96%   BMI 25.74 kg/m   Physical Exam Constitutional:      Appearance: Normal appearance.  HENT:     Head: Normocephalic and atraumatic.  Cardiovascular:     Rate and Rhythm: Normal rate and regular rhythm.  Pulmonary:     Effort: Pulmonary effort is normal.     Breath sounds: Normal breath sounds.  Musculoskeletal:     Cervical back: Normal range of motion and neck supple.     Right lower leg: Edema present.     Left lower leg: Edema present.     Comments: Swelling feet/ankles  Neurological:     Mental Status: She is alert and oriented to person, place, and time.  Psychiatric:        Mood and Affect: Mood normal.        Behavior: Behavior normal.     Assessment &  Plan:   1. Foot deformity, bilateral Skin color change-lifelong deformity , no recent injury, left knee pain caused worsening right foot concerns - Ambulatory referral to Podiatry - Ambulatory referral to Vascular Surgery Standard shoes and inserts tried in the past with pain. Pt feels knee problems making ankles worse. NO tob history 2. Chronic pain of left knee Injections in the past-pt declined surgery  due to rehab in [facility]  3. Chronic kidney disease, stage 1 Monitored yearly  4. Tremor, unspecified Worse when emotional-neuro evaluation in the past-no concerns  5. Other abnormalities of gait and mobility Chronic left knee pain-difficulty with fx femur to rehab  6. Vulvar dystrophy Estrogen cream 7. Dysuria - POCT URINALYSIS DIP (CLINITEK)  8. Urinary tract infection without hematuria, site unspecified Bactrim used in the past-rx-risk/benefit/side effects-will call when culture completed - Urine Culture  9. Osteoporosis, unspecified osteoporosis type, unspecified pathological fracture presence Fosamax-add Vit D and Calcium-needs DEXA to monitor  - DG Bone Density; Future Outpatient Encounter Medications as of 10/26/2019  Medication Sig  . alendronate (FOSAMAX) 70 MG tablet Take 70 mg by mouth every 7 (seven) days.  Marland Kitchen conjugated estrogens (PREMARIN) vaginal cream Place 1 Applicatorful vaginally 3 (three) times a week. For vaginal thinning and irritation 0.5 gram= 1/4 applicator  . LUMIGAN 0.01 % SOLN INSTILL 1 DROP IN INTO BOTH EYES AT BEDTIME  . naproxen sodium (ALEVE) 220 MG tablet Take 220 mg by mouth daily as needed.  . sulfamethoxazole-trimethoprim (BACTRIM DS,SEPTRA DS) 800-160 MG tablet Take 1 tablet by mouth 2 (two) times daily. (Patient not taking: Reported on 10/26/2019)   No facility-administered encounter medications on file as of 10/26/2019.    Follow-up: 6 months  Uvaldo Rybacki Hannah Beat, MD

## 2019-10-27 LAB — C. TRACHOMATIS/N. GONORRHOEAE RNA
C. trachomatis RNA, TMA: NOT DETECTED
N. gonorrhoeae RNA, TMA: NOT DETECTED

## 2019-10-27 LAB — URINE CULTURE

## 2019-11-02 ENCOUNTER — Telehealth: Payer: Self-pay | Admitting: Emergency Medicine

## 2019-11-02 NOTE — Telephone Encounter (Signed)
Spoke to [employer] with [employer] to be sure patient will not be billed for the C.tranchomatids and N.Gonorrheae test. We sent urine in the wrong container. Patient ws already started on abx and is feeling better.

## 2019-11-09 ENCOUNTER — Other Ambulatory Visit: Payer: Self-pay | Admitting: Podiatry

## 2019-11-09 ENCOUNTER — Encounter: Payer: Self-pay | Admitting: Podiatry

## 2019-11-09 ENCOUNTER — Ambulatory Visit (INDEPENDENT_AMBULATORY_CARE_PROVIDER_SITE_OTHER): Payer: Medicare Other

## 2019-11-09 ENCOUNTER — Other Ambulatory Visit: Payer: Self-pay

## 2019-11-09 ENCOUNTER — Ambulatory Visit (INDEPENDENT_AMBULATORY_CARE_PROVIDER_SITE_OTHER): Payer: Medicare Other | Admitting: Podiatry

## 2019-11-09 DIAGNOSIS — M79671 Pain in right foot: Secondary | ICD-10-CM

## 2019-11-09 DIAGNOSIS — R2681 Unsteadiness on feet: Secondary | ICD-10-CM

## 2019-11-09 DIAGNOSIS — M76822 Posterior tibial tendinitis, left leg: Secondary | ICD-10-CM | POA: Diagnosis not present

## 2019-11-09 DIAGNOSIS — B351 Tinea unguium: Secondary | ICD-10-CM

## 2019-11-09 DIAGNOSIS — M76821 Posterior tibial tendinitis, right leg: Secondary | ICD-10-CM

## 2019-11-09 DIAGNOSIS — M79675 Pain in left toe(s): Secondary | ICD-10-CM

## 2019-11-09 DIAGNOSIS — M79674 Pain in right toe(s): Secondary | ICD-10-CM

## 2019-11-09 DIAGNOSIS — M79672 Pain in left foot: Secondary | ICD-10-CM

## 2019-11-09 NOTE — Progress Notes (Signed)
Subjective:   Patient ID: Brittany Mahoney, female   DOB: 85 y.o.   MRN: [id number]   HPI Patient presents with terrible foot structure right over left and states that she feels very unbalanced and has had a number of falls over the last year and did break her femur.  Patient states that she feels like her feet and collapsing of her arches is a big part of her balance issues and also has thick toenails and lesions she cannot take care of herself.  Patient does not smoke likes to be active   Review of Systems  All other systems reviewed and are negative.       Objective:  Physical Exam Vitals and nursing note reviewed.  Constitutional:      Appearance: She is well-developed.  Pulmonary:     Effort: Pulmonary effort is normal.  Musculoskeletal:        General: Normal range of motion.  Skin:    General: Skin is warm.  Neurological:     Mental Status: She is alert.     Neurovascular status intact muscle strength was found to be adequate range of motion within normal limits.  Patient is noted to have severe collapse of medial longitudinal arch right over left with posterior tibial dysfunction bilateral and inability to walk with any degree of comfort.  Patient does have instability and is using a walker currently but would like to be more active if possible.  Patient has good digital perfusion well oriented x3.  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  Mycotic nail infection with pain 1-5 both feet     Plan:  H&P reviewed all conditions and at this point I recommended balance bracing or possible AFO bracing due to complete collapse of medial longitudinal arch right over left with posterior tibial dysfunction and balance issues.  I am referring to [facility] orthotist to have this worked on and today I did debride thick yellow brittle  nailbeds 1-5 both feet  X-rays indicate that there is severe collapse of medial longitudinal arch right over left with prominent navicular and talus and decalcification bilateral

## 2019-11-20 ENCOUNTER — Other Ambulatory Visit: Payer: Self-pay

## 2019-11-20 ENCOUNTER — Ambulatory Visit: Payer: Medicare Other | Admitting: Orthotics

## 2019-11-20 DIAGNOSIS — R2681 Unsteadiness on feet: Secondary | ICD-10-CM | POA: Diagnosis not present

## 2019-11-20 DIAGNOSIS — R2689 Other abnormalities of gait and mobility: Secondary | ICD-10-CM | POA: Diagnosis not present

## 2019-11-20 DIAGNOSIS — R531 Weakness: Secondary | ICD-10-CM | POA: Diagnosis not present

## 2019-11-20 DIAGNOSIS — M25562 Pain in left knee: Secondary | ICD-10-CM | POA: Diagnosis not present

## 2019-11-25 DIAGNOSIS — R2681 Unsteadiness on feet: Secondary | ICD-10-CM | POA: Diagnosis not present

## 2019-11-25 DIAGNOSIS — R2689 Other abnormalities of gait and mobility: Secondary | ICD-10-CM | POA: Diagnosis not present

## 2019-11-25 DIAGNOSIS — R531 Weakness: Secondary | ICD-10-CM | POA: Diagnosis not present

## 2019-11-25 DIAGNOSIS — M25562 Pain in left knee: Secondary | ICD-10-CM | POA: Diagnosis not present

## 2019-11-27 DIAGNOSIS — R2681 Unsteadiness on feet: Secondary | ICD-10-CM | POA: Diagnosis not present

## 2019-11-27 DIAGNOSIS — M25562 Pain in left knee: Secondary | ICD-10-CM | POA: Diagnosis not present

## 2019-11-27 DIAGNOSIS — R531 Weakness: Secondary | ICD-10-CM | POA: Diagnosis not present

## 2019-11-27 DIAGNOSIS — R2689 Other abnormalities of gait and mobility: Secondary | ICD-10-CM | POA: Diagnosis not present

## 2019-12-02 DIAGNOSIS — R2689 Other abnormalities of gait and mobility: Secondary | ICD-10-CM | POA: Diagnosis not present

## 2019-12-02 DIAGNOSIS — R531 Weakness: Secondary | ICD-10-CM | POA: Diagnosis not present

## 2019-12-02 DIAGNOSIS — M25562 Pain in left knee: Secondary | ICD-10-CM | POA: Diagnosis not present

## 2019-12-02 DIAGNOSIS — R2681 Unsteadiness on feet: Secondary | ICD-10-CM | POA: Diagnosis not present

## 2019-12-04 DIAGNOSIS — R531 Weakness: Secondary | ICD-10-CM | POA: Diagnosis not present

## 2019-12-04 DIAGNOSIS — M25562 Pain in left knee: Secondary | ICD-10-CM | POA: Diagnosis not present

## 2019-12-04 DIAGNOSIS — R2689 Other abnormalities of gait and mobility: Secondary | ICD-10-CM | POA: Diagnosis not present

## 2019-12-04 DIAGNOSIS — R2681 Unsteadiness on feet: Secondary | ICD-10-CM | POA: Diagnosis not present

## 2019-12-09 ENCOUNTER — Other Ambulatory Visit: Payer: Self-pay | Admitting: *Deleted

## 2019-12-09 DIAGNOSIS — I83893 Varicose veins of bilateral lower extremities with other complications: Secondary | ICD-10-CM

## 2019-12-09 DIAGNOSIS — R531 Weakness: Secondary | ICD-10-CM | POA: Diagnosis not present

## 2019-12-09 DIAGNOSIS — M25562 Pain in left knee: Secondary | ICD-10-CM | POA: Diagnosis not present

## 2019-12-09 DIAGNOSIS — R2689 Other abnormalities of gait and mobility: Secondary | ICD-10-CM | POA: Diagnosis not present

## 2019-12-09 DIAGNOSIS — R2681 Unsteadiness on feet: Secondary | ICD-10-CM | POA: Diagnosis not present

## 2019-12-10 ENCOUNTER — Encounter: Payer: Self-pay | Admitting: Vascular Surgery

## 2019-12-10 ENCOUNTER — Ambulatory Visit (INDEPENDENT_AMBULATORY_CARE_PROVIDER_SITE_OTHER): Payer: Medicare Other | Admitting: Vascular Surgery

## 2019-12-10 ENCOUNTER — Ambulatory Visit (HOSPITAL_COMMUNITY)
Admission: RE | Admit: 2019-12-10 | Discharge: 2019-12-10 | Disposition: A | Discharge status: Home / Self Care | Payer: Medicare Other | Source: Ambulatory Visit | Attending: Surgery | Admitting: Surgery

## 2019-12-10 ENCOUNTER — Other Ambulatory Visit: Payer: Self-pay

## 2019-12-10 VITALS — BP 163/86 | HR 73 | Temp 97.6°F | Resp 20 | Ht 66.0 in | Wt 161.0 lb

## 2019-12-10 DIAGNOSIS — I83893 Varicose veins of bilateral lower extremities with other complications: Secondary | ICD-10-CM | POA: Insufficient documentation

## 2019-12-10 DIAGNOSIS — I83813 Varicose veins of bilateral lower extremities with pain: Secondary | ICD-10-CM | POA: Diagnosis not present

## 2019-12-10 NOTE — Progress Notes (Signed)
Referring Physician: Dr. Benny Lennert Patient name: Brittany Mahoney MRN: [id number] DOB: 05/03/1935 Sex: female  REASON FOR CONSULT: Right ankle swelling  HPI: Brittany Mahoney is a 85 y.o. female, referred for evaluation of right ankle swelling.  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  She was noted recently on exam by her primary care physician to have some swelling posterior to the right lateral malleolus.  She also has varicose veins.  She does not describe any heaviness fullness or aching in her calves due to her varicose veins.  She has never had a bleeding episode from her varicose veins.  She has never had ulceration.  She has never really worn compression stockings.  The veins are not really bothersome to her.  She is able to walk but is a little bit unsteady on her feet so she usually uses a cane or a walker usually.  She has multijoint arthritis and has had a right femur fracture from a fall 3 years ago.  Other medical problems include   Past Medical History:  Diagnosis Date  . Age-related osteoporosis without current pathological fracture   . Arthritis   . Arthropathy, unspecified   . Broken hip ([place])   . Cancer ([place])    uterus  . Chronic kidney disease, stage 1   . Fracture of unspecified part of neck of right femur, sequela   . Glaucoma   . Headache(784.0)    migraines after periods  . Hepatitis   . Other abnormalities of gait and mobility   . Other amnesia   . Pain in left knee   . Pain in right foot   . Pain in right shoulder   . Personal history of (healed) traumatic fracture   . PMB (postmenopausal bleeding)   . Repeated falls   . Tremor, unspecified    Past Surgical History:  Procedure Laterality Date  . ABDOMINAL HYSTERECTOMY     TAH and BSO  . COLONOSCOPY  07/31/2006   Dr.Rourk  . DILATION AND CURETTAGE OF UTERUS    . HIP FRACTURE SURGERY    . INTRAMEDULLARY (IM) NAIL INTERTROCHANTERIC Right 04/10/2016   Procedure:  INTRAMEDULLARY (IM) NAIL RIGHT HIP FRACTURE;  Surgeon: Mcarthur Rossetti, MD;  Location: [facility];  Service: Orthopedics;  Laterality: Right;    Family History  Problem Relation Age of Onset  . Stroke Maternal Grandfather   . Stroke Paternal Grandfather   . Cancer Mother   . Cancer Father        lymphatic sarcoma  . Early death Sister   . Early death Son   . Diabetes Son   . Heart disease Other   . Cancer Other   . Tuberculosis Other   . Diabetes Other   . Thyroid disease Other   . Stroke Other   . Heart disease Maternal Aunt   . Heart disease Maternal Uncle     SOCIAL HISTORY: Social History   Socioeconomic History  . Marital status: Widowed    Spouse name: Not on file  . Number of children: Not on file  . Years of education: Not on file  . Highest education level: Not on file  Occupational History  . Not on file  Tobacco Use  . Smoking status: Never Smoker  . Smokeless tobacco: Never Used  Substance and Sexual Activity  . Alcohol use: No  . Drug use: No  . Sexual activity: Not Currently  Birth control/protection: Surgical  Other Topics Concern  . Not on file  Social History Narrative  . Not on file   Social Determinants of Health   Financial Resource Strain:   . Difficulty of Paying Living Expenses:   Food Insecurity:   . Worried About Charity fundraiser in the Last Year:   . Arboriculturist in the Last Year:   Transportation Needs:   . Film/video editor (Medical):   Marland Kitchen Lack of Transportation (Non-Medical):   Physical Activity:   . Days of Exercise per Week:   . Minutes of Exercise per Session:   Stress:   . Feeling of Stress :   Social Connections:   . Frequency of Communication with Friends and Family:   . Frequency of Social Gatherings with Friends and Family:   . Attends Religious Services:   . Active Member of Clubs or Organizations:   . Attends Archivist Meetings:   Marland Kitchen Marital Status:   Intimate Partner Violence:   .  Fear of Current or Ex-Partner:   . Emotionally Abused:   Marland Kitchen Physically Abused:   . Sexually Abused:     Allergies  Allergen Reactions  . Codeine   . Erythromycin   . Morphine And Related     loopy  . Penicillins Hives and Swelling    Has patient had a PCN reaction causing immediate rash, facial/tongue/throat swelling, SOB or lightheadedness with hypotension: Yes Has patient had a PCN reaction causing severe rash involving mucus membranes or skin necrosis: No Has patient had a PCN reaction that required hospitalization No Has patient had a PCN reaction occurring within the last 10 years: No  If all of the above answers are "NO", then may proceed with Cephalosporin use.     Current Outpatient Medications  Medication Sig Dispense Refill  . alendronate (FOSAMAX) 70 MG tablet Take 1 tablet (70 mg total) by mouth every 7 (seven) days. 12 tablet 4  . conjugated estrogens (PREMARIN) vaginal cream Place 1 Applicatorful vaginally 3 (three) times a week. For vaginal thinning and irritation 0.5 gram= 1/4 applicator 123XX123 g 2  . LUMIGAN 0.01 % SOLN INSTILL 1 DROP IN INTO BOTH EYES AT BEDTIME  6  . naproxen sodium (ALEVE) 220 MG tablet Take 220 mg by mouth daily as needed.     No current facility-administered medications for this visit.    ROS:   General:  No weight loss, Fever, chills  HEENT: No recent headaches, no nasal bleeding, no visual changes, no sore throat  Neurologic: No dizziness, blackouts, seizures. No recent symptoms of stroke or mini- stroke. No recent episodes of slurred speech, or temporary blindness.  Cardiac: No recent episodes of chest pain/pressure, no shortness of breath at rest.  No shortness of breath with exertion.  Denies history of atrial fibrillation or irregular heartbeat  Vascular: No history of rest pain in feet.  No history of claudication.  No history of non-healing ulcer, No history of DVT   Pulmonary: No home oxygen, no productive cough, no hemoptysis,   No asthma or wheezing  Musculoskeletal:  [X]  Arthritis, [ ]  Low back pain,  [X]  Joint pain  Hematologic:No history of hypercoagulable state.  No history of easy bleeding.  No history of anemia  Gastrointestinal: No hematochezia or melena,  No gastroesophageal reflux, no trouble swallowing  Urinary: [ ]  chronic Kidney disease, [ ]  on HD - [ ]  MWF or [ ]  TTHS, [ ]  Burning with urination, [ ]   Frequent urination, [ ]  Difficulty urinating;   Skin: No rashes  Psychological: No history of anxiety,  No history of depression   Physical Examination  Vitals:   12/10/19 1357  BP: (!) 163/86  Pulse: 73  Resp: 20  Temp: 97.6 F (36.4 C)  SpO2: 96%  Weight: 161 lb (73 kg)  Height: 5\' 6"  (1.676 m)    Body mass index is 25.99 kg/m.  General:  Alert and oriented, no acute distress HEENT: Normal Neck: No JVD Pulmonary: Clear to auscultation bilaterally Cardiac: Regular Rate and Rhythm Abdomen: Soft, non-tender, non-distended, no mass Skin: No rash Extremity Pulses:  2+ radial, brachial, femoral, 2+ right absent left dorsalis pedis, absent posterior tibial pulses bilaterally Musculoskeletal: No deformity or edema  Neurologic: Upper and lower extremity motor 5/5 and symmetric  DATA:  Patient had a venous reflux exam today which shows superficial venous reflux with a 3 mm right greater saphenous vein and a 4 mm left greater saphenous vein.  ASSESSMENT: Lower extremity varicose veins asymptomatic, right ankle deformity chronic no evidence of peripheral arterial disease.  Patient may have an element of mild peripheral arterial disease as the pulses in her left foot are not easily palpable.  However she is completely asymptomatic from this with no claudication symptoms or tissue loss symptoms.  No need for further evaluation or intervention on this unless she develops symptoms long-term.   PLAN: Patient will follow up with [place] on an as-needed basis   Ruta Hinds, MD [facility] Office: [phone] Pager: [phone]

## 2019-12-11 DIAGNOSIS — R531 Weakness: Secondary | ICD-10-CM | POA: Diagnosis not present

## 2019-12-11 DIAGNOSIS — M25562 Pain in left knee: Secondary | ICD-10-CM | POA: Diagnosis not present

## 2019-12-11 DIAGNOSIS — R2681 Unsteadiness on feet: Secondary | ICD-10-CM | POA: Diagnosis not present

## 2019-12-11 DIAGNOSIS — R2689 Other abnormalities of gait and mobility: Secondary | ICD-10-CM | POA: Diagnosis not present

## 2019-12-12 ENCOUNTER — Ambulatory Visit: Payer: Medicare Other | Attending: Internal Medicine

## 2019-12-12 DIAGNOSIS — Z23 Encounter for immunization: Secondary | ICD-10-CM

## 2019-12-12 NOTE — Progress Notes (Signed)
   Covid-19 Vaccination Clinic  Name:  Brittany Mahoney    MRN: [id number] DOB: 04-18-1935  12/12/2019  Brittany Mahoney was observed post Covid-19 immunization for 15 minutes without incident. She was provided with Vaccine Information Sheet and instruction to access the V-Safe system.   Brittany Mahoney was instructed to call 911 with any severe reactions post vaccine: Marland Kitchen Difficulty breathing  . Swelling of face and throat  . A fast heartbeat  . A bad rash all over body  . Dizziness and weakness   Immunizations Administered    Name Date Dose VIS Date Route   Pfizer COVID-19 Vaccine 12/12/2019 10:28 AM 0.3 mL 08/21/2019 Intramuscular   Manufacturer: [place]   Lot: [id number]   [place]: [id number]

## 2019-12-16 DIAGNOSIS — R2689 Other abnormalities of gait and mobility: Secondary | ICD-10-CM | POA: Diagnosis not present

## 2019-12-16 DIAGNOSIS — M25562 Pain in left knee: Secondary | ICD-10-CM | POA: Diagnosis not present

## 2019-12-16 DIAGNOSIS — R2681 Unsteadiness on feet: Secondary | ICD-10-CM | POA: Diagnosis not present

## 2019-12-16 DIAGNOSIS — R531 Weakness: Secondary | ICD-10-CM | POA: Diagnosis not present

## 2019-12-18 ENCOUNTER — Ambulatory Visit: Payer: Medicare Other | Admitting: Orthotics

## 2019-12-18 ENCOUNTER — Other Ambulatory Visit: Payer: Self-pay

## 2019-12-18 DIAGNOSIS — R2681 Unsteadiness on feet: Secondary | ICD-10-CM | POA: Diagnosis not present

## 2019-12-18 DIAGNOSIS — R531 Weakness: Secondary | ICD-10-CM | POA: Diagnosis not present

## 2019-12-18 DIAGNOSIS — M25562 Pain in left knee: Secondary | ICD-10-CM | POA: Diagnosis not present

## 2019-12-18 DIAGNOSIS — M79672 Pain in left foot: Secondary | ICD-10-CM

## 2019-12-18 DIAGNOSIS — M79671 Pain in right foot: Secondary | ICD-10-CM

## 2019-12-18 DIAGNOSIS — M76821 Posterior tibial tendinitis, right leg: Secondary | ICD-10-CM

## 2019-12-18 DIAGNOSIS — M129 Arthropathy, unspecified: Secondary | ICD-10-CM

## 2019-12-18 DIAGNOSIS — R2689 Other abnormalities of gait and mobility: Secondary | ICD-10-CM | POA: Diagnosis not present

## 2019-12-18 NOTE — Progress Notes (Signed)
Will reschdule when MBS comes in. Shoes she has will not accommodate

## 2019-12-23 DIAGNOSIS — R2681 Unsteadiness on feet: Secondary | ICD-10-CM | POA: Diagnosis not present

## 2019-12-23 DIAGNOSIS — M25562 Pain in left knee: Secondary | ICD-10-CM | POA: Diagnosis not present

## 2019-12-23 DIAGNOSIS — R2689 Other abnormalities of gait and mobility: Secondary | ICD-10-CM | POA: Diagnosis not present

## 2019-12-23 DIAGNOSIS — R531 Weakness: Secondary | ICD-10-CM | POA: Diagnosis not present

## 2019-12-25 DIAGNOSIS — M25562 Pain in left knee: Secondary | ICD-10-CM | POA: Diagnosis not present

## 2019-12-25 DIAGNOSIS — R2689 Other abnormalities of gait and mobility: Secondary | ICD-10-CM | POA: Diagnosis not present

## 2019-12-25 DIAGNOSIS — R2681 Unsteadiness on feet: Secondary | ICD-10-CM | POA: Diagnosis not present

## 2019-12-25 DIAGNOSIS — R531 Weakness: Secondary | ICD-10-CM | POA: Diagnosis not present

## 2020-01-06 ENCOUNTER — Other Ambulatory Visit: Payer: Self-pay

## 2020-01-06 ENCOUNTER — Ambulatory Visit (INDEPENDENT_AMBULATORY_CARE_PROVIDER_SITE_OTHER): Payer: Medicare Other | Admitting: Orthotics

## 2020-01-06 ENCOUNTER — Ambulatory Visit: Payer: Medicare Other | Attending: Internal Medicine

## 2020-01-06 DIAGNOSIS — Z23 Encounter for immunization: Secondary | ICD-10-CM

## 2020-01-06 DIAGNOSIS — M76822 Posterior tibial tendinitis, left leg: Secondary | ICD-10-CM

## 2020-01-06 DIAGNOSIS — R2689 Other abnormalities of gait and mobility: Secondary | ICD-10-CM | POA: Diagnosis not present

## 2020-01-06 DIAGNOSIS — M79672 Pain in left foot: Secondary | ICD-10-CM

## 2020-01-06 DIAGNOSIS — R2681 Unsteadiness on feet: Secondary | ICD-10-CM | POA: Diagnosis not present

## 2020-01-06 DIAGNOSIS — M76821 Posterior tibial tendinitis, right leg: Secondary | ICD-10-CM | POA: Diagnosis not present

## 2020-01-06 DIAGNOSIS — M129 Arthropathy, unspecified: Secondary | ICD-10-CM | POA: Diagnosis not present

## 2020-01-06 DIAGNOSIS — M79671 Pain in right foot: Secondary | ICD-10-CM | POA: Diagnosis not present

## 2020-01-06 NOTE — Progress Notes (Signed)
Patient came in today to pick up [place] Balance Brace (Bilateral).   Patient was able to don brace independently and brace was check for fit to custom.   Patient was observed walking with brace and gait was improved as well as stability.   Patient was advised of care and wearing instructions.  Advised to notify practice if there were any issues; especially skin irritation.  

## 2020-01-06 NOTE — Progress Notes (Signed)
   Covid-19 Vaccination Clinic  Name:  Brittany Mahoney    MRN: [id number] DOB: February 11, 1935  01/06/2020  [name] was observed post Covid-19 immunization for 15 minutes without incident. She was provided with Vaccine Information Sheet and instruction to access the V-Safe system.   [name] was instructed to call 911 with any severe reactions post vaccine: Marland Kitchen Difficulty breathing  . Swelling of face and throat  . A fast heartbeat  . A bad rash all over body  . Dizziness and weakness   Immunizations Administered    Name Date Dose VIS Date Route   Pfizer COVID-19 Vaccine 01/06/2020 10:34 AM 0.3 mL 11/04/2018 Intramuscular   Manufacturer: [place]   Lot: [id number]   [place]: [id number]

## 2020-02-12 ENCOUNTER — Other Ambulatory Visit: Payer: Self-pay

## 2020-02-12 ENCOUNTER — Ambulatory Visit (INDEPENDENT_AMBULATORY_CARE_PROVIDER_SITE_OTHER): Payer: Medicare Other | Admitting: Podiatry

## 2020-02-12 ENCOUNTER — Encounter: Payer: Self-pay | Admitting: Podiatry

## 2020-02-12 DIAGNOSIS — M79674 Pain in right toe(s): Secondary | ICD-10-CM

## 2020-02-12 DIAGNOSIS — M79675 Pain in left toe(s): Secondary | ICD-10-CM

## 2020-02-12 DIAGNOSIS — B351 Tinea unguium: Secondary | ICD-10-CM | POA: Diagnosis not present

## 2020-02-12 MED ORDER — NONFORMULARY OR COMPOUNDED ITEM: 3 refills | Status: DC

## 2020-02-12 NOTE — Patient Instructions (Addendum)
[facility] [phone] Antifungal nail solution  Schedule and appointment with Liliane Channel and bring your shoes and inserts for adjustment

## 2020-02-17 NOTE — Progress Notes (Signed)
Subjective: Brittany Mahoney is a pleasant 85 y.o. female patient seen today painful mycotic nails b/l that are difficult to trim. Pain interferes with ambulation. Aggravating factors include wearing enclosed shoe gear. Pain is relieved with periodic professional debridement.  Patient Active Problem List   Diagnosis Date Noted  . Dysuria 10/26/2019  . Arthropathy, unspecified   . Osteoporosis   . Chronic kidney disease, stage 1   . Fracture of unspecified part of neck of right femur, sequela   . Other abnormalities of gait and mobility   . Other amnesia   . Pain in left knee   . Pain in right foot   . Pain in right shoulder   . Personal history of (healed) traumatic fracture   . Repeated falls   . Tremor, unspecified   . Chronic pain of left knee 08/25/2018  . Chronic pain of right knee 08/25/2018  . Urinary tract infection without hematuria 04/02/2018  . History of uterine cancer 04/01/2017  . Closed right hip fracture ([place]) 04/09/2016  . Hip fracture ([place]) 04/09/2016  . 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  . Endometrial cancer, grade I ([place]) 03/21/2015  . Well woman exam with routine gynecological exam 03/15/2014  . Screening for malignant neoplasm of the cervix 03/15/2014  . Hx of cancer of endometrium 03/06/2013    Current Outpatient Medications on File Prior to Visit  Medication Sig Dispense Refill  . alendronate (FOSAMAX) 70 MG tablet Take 1 tablet (70 mg total) by mouth every 7 (seven) days. 12 tablet 4  . conjugated estrogens (PREMARIN) vaginal cream Place 1 Applicatorful vaginally 3 (three) times a week. For vaginal thinning and irritation 0.5 gram= 1/4 applicator 27.2 g 2  . LUMIGAN 0.01 % SOLN INSTILL 1 DROP IN INTO BOTH EYES AT BEDTIME  6  . naproxen sodium (ALEVE) 220 MG tablet Take 220 mg by mouth daily as needed.     No current facility-administered medications on file prior to visit.    Allergies  Allergen Reactions  . Codeine   . Erythromycin   . Morphine And  Related     loopy  . Penicillins Hives and Swelling    Has patient had a PCN reaction causing immediate rash, facial/tongue/throat swelling, SOB or lightheadedness with hypotension: Yes Has patient had a PCN reaction causing severe rash involving mucus membranes or skin necrosis: No Has patient had a PCN reaction that required hospitalization No Has patient had a PCN reaction occurring within the last 10 years: No  If all of the above answers are "NO", then may proceed with Cephalosporin use.     Objective: Physical Exam  General: Brittany Mahoney is a pleasant 85 y.o. Caucasian female, in NAD. AAO x 3.   Vascular:  Capillary refill time to digits immediate b/l. Palpable DP pulses b/l. Palpable PT pulses b/l. Pedal hair sparse b/l. Skin temperature gradient within normal limits b/l. No pain with calf compression b/l.  Dermatological:  Pedal skin with normal turgor, texture and tone bilaterally. No open wounds bilaterally. No interdigital macerations bilaterally. Toenails 1-5 b/l elongated, discolored, dystrophic, thickened, crumbly with subungual debris and tenderness to dorsal palpation.  Musculoskeletal:  Normal muscle strength 5/5 to all lower extremity muscle groups bilaterally. No pain crepitus or joint limitation noted with ROM b/l. Hammertoes 2-5 b/l. Pes planovalgus with plantarmedial midfoot collapse b/l.   Neurological:  Protective sensation intact 5/5 intact bilaterally with 10g monofilament b/l. Vibratory sensation intact b/l. Proprioception intact bilaterally. Clonus negative b/l.  Assessment and Plan:  1. Pain due to onychomycosis of toenails of both feet    -Examined patient. -No new findings. No new orders. -Discussed topical, laser and oral medication. Patient opted for topical treatment with compounded medication. Rx written for nonformulary compounding topical antifungal: [facility]: Antifungal cream - Terbinafine 3%, Fluconazole 2%, Tea Tree Oil 5%, Urea  10%, Ibuprofen 2% in DMSO Suspension #61ml. Apply to the affected nail(s) at bedtime. -Toenails 1-5 b/l were debrided in length and girth with sterile nail nippers and dremel without iatrogenic bleeding.  -Patient to continue soft, supportive shoe gear daily. -Patient to report any pedal injuries to medical professional immediately. -Patient/POA to call should there be question/concern in the interim.  Return in about 3 months (around 05/14/2020) for nail trim.  Marzetta Board, DPM

## 2020-02-29 ENCOUNTER — Other Ambulatory Visit: Payer: Self-pay

## 2020-02-29 ENCOUNTER — Encounter (INDEPENDENT_AMBULATORY_CARE_PROVIDER_SITE_OTHER): Payer: Self-pay | Admitting: Nurse Practitioner

## 2020-02-29 ENCOUNTER — Ambulatory Visit (INDEPENDENT_AMBULATORY_CARE_PROVIDER_SITE_OTHER): Payer: Medicare Other | Admitting: Nurse Practitioner

## 2020-02-29 VITALS — BP 130/80 | HR 94 | Temp 97.6°F | Ht 67.0 in | Wt 161.4 lb

## 2020-02-29 DIAGNOSIS — Z136 Encounter for screening for cardiovascular disorders: Secondary | ICD-10-CM

## 2020-02-29 DIAGNOSIS — Z1329 Encounter for screening for other suspected endocrine disorder: Secondary | ICD-10-CM

## 2020-02-29 DIAGNOSIS — M810 Age-related osteoporosis without current pathological fracture: Secondary | ICD-10-CM

## 2020-02-29 DIAGNOSIS — R252 Cramp and spasm: Secondary | ICD-10-CM | POA: Diagnosis not present

## 2020-02-29 DIAGNOSIS — Z131 Encounter for screening for diabetes mellitus: Secondary | ICD-10-CM

## 2020-02-29 DIAGNOSIS — N181 Chronic kidney disease, stage 1: Secondary | ICD-10-CM | POA: Diagnosis not present

## 2020-02-29 DIAGNOSIS — Z13220 Encounter for screening for lipoid disorders: Secondary | ICD-10-CM

## 2020-02-29 NOTE — Patient Instructions (Signed)
[facility] Dietary Recommendations for Weight Loss What to Avoid . Avoid added sugars o Often added sugar can be found in processed foods such as many condiments, dry cereals, cakes, cookies, chips, crisps, crackers, candies, sweetened drinks, etc.  o Read labels and AVOID/DECREASE use of foods with the following in their ingredient list: Sugar, fructose, high fructose corn syrup, sucrose, glucose, maltose, dextrose, molasses, cane sugar, brown sugar, any type of syrup, agave nectar, etc.   . Avoid snacking in between meals . Avoid foods made with flour o If you are going to eat food made with flour, choose those made with whole-grains; and, minimize your consumption as much as is tolerable . Avoid processed foods o These foods are generally stocked in the middle of the grocery store. Focus on shopping on the perimeter of the grocery.  . Avoid Meat  o We recommend following a plant-based diet at [facility]. Thus, we recommend avoiding meat as a general rule. Consider eating beans, legumes, eggs, and/or dairy products for regular protein sources o If you plan on eating meat limit to 4 ounces of meat at a time and choose lean options such as Fish, chicken, turkey. Avoid red meat intake such as pork and/or steak What to Include . Vegetables o GREEN LEAFY VEGETABLES: Kale, spinach, mustard greens, collard greens, cabbage, broccoli, etc. o OTHER: Asparagus, cauliflower, eggplant, carrots, peas, Brussel sprouts, tomatoes, bell peppers, zucchini, beets, cucumbers, etc. . Grains, seeds, and legumes o Beans: kidney beans, black eyed peas, garbanzo beans, black beans, pinto beans, etc. o Whole, unrefined grains: brown rice, barley, bulgur, oatmeal, etc. . Healthy fats  o Avoid highly processed fats such as vegetable oil o Examples of healthy fats: avocado, olives, virgin olive oil, dark chocolate (?72% Cocoa), nuts (peanuts, almonds, walnuts, cashews, pecans, etc.) . None to Low  Intake of Animal Sources of Protein o Meat sources: chicken, turkey, salmon, tuna. Limit to 4 ounces of meat at one time. o Consider limiting dairy sources, but when choosing dairy focus on: PLAIN Greek yogurt, cottage cheese, high-protein milk . Fruit o Choose berries  When to Eat . Intermittent Fasting: o Choosing not to eat for a specific time period, but DO FOCUS ON HYDRATION when fasting o Multiple Techniques: - Time Restricted Eating: eat 3 meals in a day, each meal lasting no more than 60 minutes, no snacks between meals - 16-18 hour fast: fast for 16 to 18 hours up to 7 days a week. Often suggested to start with 2-3 nonconsecutive days per week.  . Remember the time you sleep is counted as fasting.  . Examples of eating schedule: Fast from 7:00pm-11:00am. Eat between 11:00am-7:00pm.  - 24-hour fast: fast for 24 hours up to every other day. Often suggested to start with 1 day per week . Remember the time you sleep is counted as fasting . Examples of eating schedule:  o Eating day: eat 2-3 meals on your eating day. If doing 2 meals, each meal should last no more than 90 minutes. If doing 3 meals, each meal should last no more than 60 minutes. Finish last meal by 7:00pm. o Fasting day: Fast until 7:00pm.  o IF YOU FEEL UNWELL FOR ANY REASON/IN ANY WAY WHEN FASTING, STOP FASTING BY EATING A NUTRITIOUS SNACK OR LIGHT MEAL o ALWAYS FOCUS ON HYDRATION DURING FASTS - Acceptable Hydration sources: water, broths, tea/coffee (black tea/coffee is best but using a small amount of whole-fat dairy products in coffee/tea is acceptable).  -   Poor Hydration Sources: anything with sugar or artificial sweeteners added to it  These recommendations have been developed for patients that are actively receiving medical care from either Dr. [facility] or Lakeya Mulka, DNP, NP-C at [facility]. These recommendations are developed for patients with specific medical conditions and are not meant to be  distributed or used by others that are not actively receiving care from either provider listed above at [facility]. It is not appropriate to participate in the above eating plans without proper medical supervision.   Reference: Fung, J. The obesity code. [place]: [place]; 2016.   

## 2020-02-29 NOTE — Progress Notes (Signed)
Subjective:  Patient ID: Brittany Mahoney, female    DOB: 06-12-1935  Age: 85 y.o. MRN: [id number]  CC:  Chief Complaint  Patient presents with  . Establish Care      HPI  This patient arrives today to establish care. She is coming to this office due to her previous primary care provider leaving the area.  She is an 85 year old female with a past medical history significant for osteoporosis, endometrial cancer, chronic kidney disease stage I, multiple falls, and chronic pain of bilateral knees.  She has no significant acute complaints today, but does tell me that she will sometimes have a muscle cramp in the left side of her lumbar spine.  It does not seem to bother her too much, it is not debilitating regarding her mobility, but she wants to mention that this does occur intermittently.  She also would like to discuss healthy eating.   Past Medical History:  Diagnosis Date  . Age-related osteoporosis without current pathological fracture   . Arthritis   . Arthropathy, unspecified   . Broken hip ([place])   . Cancer ([place])    uterus  . Chronic kidney disease, stage 1   . Fracture of unspecified part of neck of right femur, sequela   . Glaucoma   . Headache(784.0)    migraines after periods  . Hepatitis   . Other abnormalities of gait and mobility   . Other amnesia   . Pain in left knee   . Pain in right foot   . Pain in right shoulder   . Personal history of (healed) traumatic fracture   . PMB (postmenopausal bleeding)   . Repeated falls   . Tremor, unspecified       Family History  Problem Relation Age of Onset  . Stroke Maternal Grandfather   . Stroke Paternal Grandfather   . Cancer Mother   . Cancer Father        lymphatic sarcoma  . Early death Sister   . Early death Son   . Diabetes Son   . Heart disease Other   . Cancer Other   . Tuberculosis Other   . Diabetes Other   . Thyroid disease Other   . Stroke Other   . Heart disease Maternal Aunt   . Heart  disease Maternal Uncle     Social History   Social History Narrative  . Not on file   Social History   Tobacco Use  . Smoking status: Never Smoker  . Smokeless tobacco: Never Used  Substance Use Topics  . Alcohol use: No     Current Meds  Medication Sig  . alendronate (FOSAMAX) 70 MG tablet Take 1 tablet (70 mg total) by mouth every 7 (seven) days.  Marland Kitchen conjugated estrogens (PREMARIN) vaginal cream Place 1 Applicatorful vaginally 3 (three) times a week. For vaginal thinning and irritation 0.5 gram= 1/4 applicator  . LUMIGAN 0.01 % SOLN INSTILL 1 DROP IN INTO BOTH EYES AT BEDTIME  . naproxen sodium (ALEVE) 220 MG tablet Take 220 mg by mouth daily as needed.  . NONFORMULARY OR COMPOUNDED ITEM Antifungal solution: Terbinafine 3%, Fluconazole 2%, Tea Tree Oil 5%, Urea 10%, Ibuprofen 2% in DMSO suspension #26mL    ROS:  Reviewed and negative   Objective:   Today's Vitals: BP 130/80 (BP Location: Right Arm, Patient Position: Sitting, Cuff Size: Normal)   Pulse 94   Temp 97.6 F (36.4 C) (Temporal)   Ht 5\' 7"  (1.702  m)   Wt 161 lb 6.4 oz (73.2 kg)   SpO2 96%   BMI 25.28 kg/m  Vitals with BMI 02/29/2020 12/10/2019 10/26/2019  Height 5\' 7"  5\' 6"  5' 6.6"  Weight 161 lbs 6 oz 161 lbs 162 lbs 6 oz  BMI 97.74 26 14.23  Systolic 953 202 334  Diastolic 80 86 87  Pulse 94 73 75     Physical Exam Vitals reviewed.  Constitutional:      General: She is not in acute distress.    Appearance: Normal appearance.  HENT:     Head: Normocephalic and atraumatic.  Neck:     Vascular: No carotid bruit.  Cardiovascular:     Rate and Rhythm: Normal rate and regular rhythm.     Pulses: Normal pulses.     Heart sounds: Normal heart sounds.  Pulmonary:     Effort: Pulmonary effort is normal.     Breath sounds: Normal breath sounds.  Musculoskeletal:     Right lower leg: Edema present.     Left lower leg: Edema present.  Skin:    General: Skin is warm and dry.  Neurological:      General: No focal deficit present.     Mental Status: She is alert and oriented to person, place, and time.  Psychiatric:        Mood and Affect: Mood normal.        Behavior: Behavior normal.        Judgment: Judgment normal.          Assessment and Plan   1. Muscle cramp   2. Osteoporosis without current pathological fracture, unspecified osteoporosis type   3. Chronic kidney disease, stage 1   4. Screening for diabetes mellitus   5. Screening for lipid disorders   6. Screening for thyroid disorder      Plan: 1.-6.  Her muscle cramps do not seem to be too bothersome for her at this time.  For now we will start with checking blood work for any possible abnormalities that could explain her cramping.  We will also check blood work to screen for diabetes, hyperlipidemia, thyroid disease, check her kidney function, and check a vitamin D level.  She will follow-up in approximate 1 month to have her annual wellness visit as well to discuss blood work results.  She was encouraged to let me know she has any questions or concerns prior to her next appointment.   Tests ordered No orders of the defined types were placed in this encounter.     No orders of the defined types were placed in this encounter.    Ailene Ards, NP

## 2020-03-09 DIAGNOSIS — R252 Cramp and spasm: Secondary | ICD-10-CM | POA: Diagnosis not present

## 2020-03-09 DIAGNOSIS — Z136 Encounter for screening for cardiovascular disorders: Secondary | ICD-10-CM | POA: Diagnosis not present

## 2020-03-09 DIAGNOSIS — Z131 Encounter for screening for diabetes mellitus: Secondary | ICD-10-CM | POA: Diagnosis not present

## 2020-03-09 DIAGNOSIS — Z13220 Encounter for screening for lipoid disorders: Secondary | ICD-10-CM | POA: Diagnosis not present

## 2020-03-09 DIAGNOSIS — N181 Chronic kidney disease, stage 1: Secondary | ICD-10-CM | POA: Diagnosis not present

## 2020-03-09 DIAGNOSIS — Z1329 Encounter for screening for other suspected endocrine disorder: Secondary | ICD-10-CM | POA: Diagnosis not present

## 2020-03-09 DIAGNOSIS — M810 Age-related osteoporosis without current pathological fracture: Secondary | ICD-10-CM | POA: Diagnosis not present

## 2020-03-10 LAB — COMPLETE METABOLIC PANEL WITH GFR
AG Ratio: 1.6 (calc) (ref 1.0–2.5)
ALT: 10 U/L (ref 6–29)
AST: 11 U/L (ref 10–35)
Albumin: 4.2 g/dL (ref 3.6–5.1)
Alkaline phosphatase (APISO): 90 U/L (ref 37–153)
BUN/Creatinine Ratio: 14 (calc) (ref 6–22)
BUN: 14 mg/dL (ref 7–25)
CO2: 25 mmol/L (ref 20–32)
Calcium: 9.4 mg/dL (ref 8.6–10.4)
Chloride: 103 mmol/L (ref 98–110)
Creat: 1.02 mg/dL — ABNORMAL HIGH (ref 0.60–0.88)
GFR, Est African American: 59 mL/min/{1.73_m2} — ABNORMAL LOW (ref >=60)
GFR, Est Non African American: 51 mL/min/{1.73_m2} — ABNORMAL LOW (ref >=60)
Globulin: 2.7 g/dL (calc) (ref 1.9–3.7)
Glucose, Bld: 115 mg/dL — ABNORMAL HIGH (ref 65–99)
Potassium: 4 mmol/L (ref 3.5–5.3)
Sodium: 140 mmol/L (ref 135–146)
Total Bilirubin: 1 mg/dL (ref 0.2–1.2)
Total Protein: 6.9 g/dL (ref 6.1–8.1)

## 2020-03-10 LAB — VITAMIN D 25 HYDROXY (VIT D DEFICIENCY, FRACTURES): Vit D, 25-Hydroxy: 16 ng/mL — ABNORMAL LOW (ref 30–100)

## 2020-03-10 LAB — CBC
HCT: 46 % — ABNORMAL HIGH (ref 35.0–45.0)
Hemoglobin: 14.8 g/dL (ref 11.7–15.5)
MCH: 30.3 pg (ref 27.0–33.0)
MCHC: 32.2 g/dL (ref 32.0–36.0)
MCV: 94.1 fL (ref 80.0–100.0)
MPV: 9.6 fL (ref 7.5–12.5)
Platelets: 307 10*3/uL (ref 140–400)
RBC: 4.89 10*6/uL (ref 3.80–5.10)
RDW: 12.6 % (ref 11.0–15.0)
WBC: 8.6 10*3/uL (ref 3.8–10.8)

## 2020-03-10 LAB — LIPID PANEL
Cholesterol: 125 mg/dL (ref <200)
HDL: 40 mg/dL — ABNORMAL LOW (ref >=50)
LDL Cholesterol (Calc): 70 mg/dL (calc)
Non-HDL Cholesterol (Calc): 85 mg/dL (calc) (ref <130)
Total CHOL/HDL Ratio: 3.1 (calc) (ref <5.0)
Triglycerides: 74 mg/dL (ref <150)

## 2020-03-10 LAB — TSH: TSH: 2.1 mIU/L (ref 0.40–4.50)

## 2020-03-30 ENCOUNTER — Other Ambulatory Visit: Payer: Self-pay

## 2020-03-30 ENCOUNTER — Ambulatory Visit (INDEPENDENT_AMBULATORY_CARE_PROVIDER_SITE_OTHER): Payer: Medicare Other | Admitting: Nurse Practitioner

## 2020-03-30 ENCOUNTER — Encounter (INDEPENDENT_AMBULATORY_CARE_PROVIDER_SITE_OTHER): Payer: Self-pay | Admitting: Nurse Practitioner

## 2020-03-30 ENCOUNTER — Telehealth (INDEPENDENT_AMBULATORY_CARE_PROVIDER_SITE_OTHER): Payer: Self-pay | Admitting: Nurse Practitioner

## 2020-03-30 VITALS — BP 120/80 | HR 95 | Temp 97.8°F | Ht 67.0 in | Wt 160.0 lb

## 2020-03-30 DIAGNOSIS — M810 Age-related osteoporosis without current pathological fracture: Secondary | ICD-10-CM

## 2020-03-30 DIAGNOSIS — R300 Dysuria: Secondary | ICD-10-CM | POA: Diagnosis not present

## 2020-03-30 DIAGNOSIS — Z0000 Encounter for general adult medical examination without abnormal findings: Secondary | ICD-10-CM | POA: Diagnosis not present

## 2020-03-30 NOTE — Progress Notes (Signed)
Subjective:   Brittany Mahoney is a 85 y.o. female who presents for Medicare Annual (Subsequent) preventive examination.  Review of Systems     Cardiac Risk Factors include: advanced age (>72men, >92 women)     Objective:    Today's Vitals   03/30/20 1257  BP: 120/80  Pulse: 95  Temp: 97.8 F (36.6 C)  TempSrc: Temporal  SpO2: 96%  Weight: 160 lb (72.6 kg)  Height: 5\' 7"  (1.702 m)   Body mass index is 25.06 kg/m.  Advanced Directives 03/30/2020 12/10/2019 10/02/2018 09/16/2018 05/16/2017 04/25/2017 04/18/2017  Does Patient Have a Medical Advance Directive? Yes Yes Yes Yes Yes Yes Yes  Type of Paramedic of [place];Living will [place];Living will [place];Living will [place];Living will [place];Living will [place];Living will  Does patient want to make changes to medical advance directive? No - Patient declined No - Patient declined - - - - -  Copy of [place] in Chart? No - copy requested - - - No - copy requested No - copy requested No - copy requested  Would patient like information on creating a medical advance directive? - - - - - - -    Current Medications (verified) Outpatient Encounter Medications as of 03/30/2020  Medication Sig  . alendronate (FOSAMAX) 70 MG tablet Take 1 tablet (70 mg total) by mouth every 7 (seven) days.  . Cholecalciferol 1.25 MG (50000 UT) TABS Take 5,000 Int'l Units by mouth daily.  Marland Kitchen conjugated estrogens (PREMARIN) vaginal cream Place 1 Applicatorful vaginally 3 (three) times a week. For vaginal thinning and irritation 0.5 gram= 1/4 applicator  . Cranberry 500 MG CAPS Take 500 mg by mouth daily.  Marland Kitchen LUMIGAN 0.01 % SOLN INSTILL 1 DROP IN INTO BOTH EYES AT BEDTIME  . naproxen sodium (ALEVE) 220 MG tablet Take 220 mg by mouth daily as needed.  . NONFORMULARY OR COMPOUNDED ITEM  Antifungal solution: Terbinafine 3%, Fluconazole 2%, Tea Tree Oil 5%, Urea 10%, Ibuprofen 2% in DMSO suspension #25mL   No facility-administered encounter medications on file as of 03/30/2020.    Allergies (verified) Codeine, Erythromycin, Morphine and related, and Penicillins   History: Past Medical History:  Diagnosis Date  . Age-related osteoporosis without current pathological fracture   . Arthritis   . Arthropathy, unspecified   . Broken hip ([place])   . Cancer ([place])    uterus  . Chronic kidney disease, stage 1   . Fracture of unspecified part of neck of right femur, sequela   . Glaucoma   . Headache(784.0)    migraines after periods  . Hepatitis   . Other abnormalities of gait and mobility   . Other amnesia   . Pain in left knee   . Pain in right foot   . Pain in right shoulder   . Personal history of (healed) traumatic fracture   . PMB (postmenopausal bleeding)   . Repeated falls   . Tremor, unspecified    Past Surgical History:  Procedure Laterality Date  . ABDOMINAL HYSTERECTOMY     TAH and BSO  . COLONOSCOPY  07/31/2006   Dr.Rourk  . DILATION AND CURETTAGE OF UTERUS    . HIP FRACTURE SURGERY    . INTRAMEDULLARY (IM) NAIL INTERTROCHANTERIC Right 04/10/2016   Procedure: INTRAMEDULLARY (IM) NAIL RIGHT HIP FRACTURE;  Surgeon: Mcarthur Rossetti, MD;  Location: [facility];  Service:  Orthopedics;  Laterality: Right;   Family History  Problem Relation Age of Onset  . Stroke Maternal Grandfather   . Stroke Paternal Grandfather   . Cancer Mother   . Cancer Father        lymphatic sarcoma  . Early death Sister   . Early death Son   . Diabetes Son   . Heart disease Other   . Cancer Other   . Tuberculosis Other   . Diabetes Other   . Thyroid disease Other   . Stroke Other   . Heart disease Maternal Aunt   . Heart disease Maternal Uncle    Social History   Socioeconomic History  . Marital status: Widowed    Spouse name: Not on file  . Number of children: Not  on file  . Years of education: Not on file  . Highest education level: Not on file  Occupational History  . Not on file  Tobacco Use  . Smoking status: Never Smoker  . Smokeless tobacco: Never Used  Vaping Use  . Vaping Use: Never used  Substance and Sexual Activity  . Alcohol use: No  . Drug use: No  . Sexual activity: Not Currently    Birth control/protection: Surgical  Other Topics Concern  . Not on file  Social History Narrative  . Not on file   Social Determinants of Health   Financial Resource Strain:   . Difficulty of Paying Living Expenses:   Food Insecurity:   . Worried About Charity fundraiser in the Last Year:   . Arboriculturist in the Last Year:   Transportation Needs:   . Film/video editor (Medical):   Marland Kitchen Lack of Transportation (Non-Medical):   Physical Activity:   . Days of Exercise per Week:   . Minutes of Exercise per Session:   Stress:   . Feeling of Stress :   Social Connections:   . Frequency of Communication with Friends and Family:   . Frequency of Social Gatherings with Friends and Family:   . Attends Religious Services:   . Active Member of Clubs or Organizations:   . Attends Archivist Meetings:   Marland Kitchen Marital Status:     Tobacco Counseling Counseling given: Not Answered   Clinical Intake:  Pre-visit preparation completed: Yes  Pain : No/denies pain     Nutritional Status: BMI 25 -29 Overweight Nutritional Risks: None Diabetes: No  How often do you need to have someone help you when you read instructions, pamphlets, or other written materials from your doctor or pharmacy?: 1 - Never What is the last grade level you completed in school?: 3 years of college  Diabetic? No  Interpreter Needed?: No  Information entered by :: Awilda Bill, [place]   Activities of Daily Living In your present state of health, do you have any difficulty performing the following activities: 03/30/2020  Hearing? N  Vision? Y  Comment  wear glasses  Difficulty concentrating or making decisions? N  Walking or climbing stairs? Y  Dressing or bathing? N  Doing errands, shopping? Y  Comment does not drive on her own  Preparing Food and eating ? N  Using the Toilet? N  In the past six months, have you accidently leaked urine? Y  Do you have problems with loss of bowel control? Y  Managing your Medications? N  Managing your Finances? N  Housekeeping or managing your Housekeeping? N  Some recent data might be hidden  Patient Care Team: Ailene Ards, NP as PCP - General (Nurse Practitioner)  Indicate any recent Medical Services you may have received from other than [facility] providers in the past year (date may be approximate).     Assessment:   This is a routine wellness examination for [facility].  Hearing/Vision screen No exam data present  Dietary issues and exercise activities discussed: Current Exercise Habits: The patient does not participate in regular exercise at present, Exercise limited by: None identified  Goals    . Prevent falls      Depression Screen PHQ 2/9 Scores 03/30/2020 10/26/2019 04/01/2017  PHQ - 2 Score 0 0 1    Fall Risk Fall Risk  03/30/2020 10/26/2019 04/10/2019 04/11/2018 05/04/2016  Falls in the past year? 1 1 1  Yes Yes  Comment - - Emmi Telephone Survey: data to providers prior to load Emmi Telephone Survey: data to providers prior to load Emmi Telephone Survey: data to providers prior to load  Number falls in past yr: 0 1 1 2  or more 1  Comment - - Emmi Telephone Survey Actual Response = 3 Emmi Telephone Survey Actual Response = 3 Emmi Telephone Survey Actual Response = 1  Injury with Fall? 1 0 0 No Yes  Risk for fall due to : Impaired mobility - - - -  Follow up Falls evaluation completed - - - -    Any stairs in or around the home? No  If so, are there any without handrails? No  - N/A Home free of loose throw rugs in walkways, pet beds, electrical cords, etc? Yes  Adequate lighting in  your home to reduce risk of falls? Yes   ASSISTIVE DEVICES UTILIZED TO PREVENT FALLS:  Life alert? Yes  Use of a cane, walker or w/c? Yes  - Walker Grab bars in the bathroom? Yes  Shower chair or bench in shower? Yes  Elevated toilet seat or a handicapped toilet? Yes   TIMED UP AND GO:  Was the test performed? Yes .  Length of time to ambulate 10 feet: 31 sec.   Gait slow and steady with assistive device  Cognitive Function: MMSE - Mini Mental State Exam 10/26/2019  Orientation to time 5  Orientation to Place 5  Registration 3  Attention/ Calculation 3  Recall 2  Language- name 2 objects 2  Language- repeat 1  Language- follow 3 step command 2  Language- read & follow direction 1  Write a sentence 1  Copy design 1  Total score 26     6CIT Screen 03/30/2020  What Year? 0 points  What month? 0 points  What time? 0 points  Count back from 20 0 points  Months in reverse 0 points  Repeat phrase 0 points  Total Score 0    Immunizations Immunization History  Administered Date(s) Administered  . Fluad Quad(high Dose 65+) 07/11/2019  . Influenza, High Dose Seasonal PF 07/02/2017, 07/19/2018  . PFIZER SARS-COV-2 Vaccination 12/12/2019, 01/06/2020  . Pneumococcal Polysaccharide-23 06/14/2014    TDAP status: Due, Education has been provided regarding the importance of this vaccine. Advised may receive this vaccine at local pharmacy or Health Dept. Aware to provide a copy of the vaccination record if obtained from local pharmacy or Health Dept. Verbalized acceptance and understanding. Flu Vaccine status: Up to date Pneumococcal vaccine status: Up to date Covid-19 vaccine status: Completed vaccines  Qualifies for Shingles Vaccine? Yes   Zostavax completed Yes   Shingrix Completed?: No.    Education  has been provided regarding the importance of this vaccine. Patient has been advised to call insurance company to determine out of pocket expense if they have not yet received  this vaccine. Advised may also receive vaccine at local pharmacy or Health Dept. Verbalized acceptance and understanding.  Screening Tests Health Maintenance  Topic Date Due  . PNA vac Low Risk Adult (2 of 2 - PCV13) 06/15/2015  . TETANUS/TDAP  03/30/2021 (Originally 10/02/1953)  . INFLUENZA VACCINE  04/10/2020  . DEXA SCAN  Completed  . COVID-19 Vaccine  Completed    Health Maintenance  Health Maintenance Due  Topic Date Due  . PNA vac Low Risk Adult (2 of 2 - PCV13) 06/15/2015    Colorectal cancer screening: No longer required.  Mammogram status: No longer required.  - Has had one in the last year. (Completed on 06/08/19) Bone Density status: Ordered 10/26/19. Pt provided with contact info and advised to call to schedule appt.  Lung Cancer Screening: (Low Dose CT Chest recommended if Age 15-80 years, 30 pack-year currently smoking OR have quit w/in 15years.) does not qualify.   Lung Cancer Screening Referral: N/A  Additional Screening:  Hepatitis C Screening: does qualify; Completed has not been done -- patient would like to hold off on testing.  Vision Screening: Recommended annual ophthalmology exams for early detection of glaucoma and other disorders of the eye. Is the patient up to date with their annual eye exam?  Yes  Who is the provider or what is the name of the office in which the patient attends annual eye exams? [facility] (Dr. Jorja Loa) If pt is not established with a provider, would they like to be referred to a provider to establish care? No .   Dental Screening: Recommended annual dental exams for proper oral hygiene  Community Resource Referral / Chronic Care Management: CRR required this visit?  No   CCM required this visit?  No      Plan:    I did discuss patient's lab results with her today as she got those collected at her last office visit.  I did discuss the need to start a vitamin D supplement and recommend she get vitamin D3 over-the-counter and take  5000 IUs by mouth daily.  She will return in 2 months at which point we should recheck serum levels.  We also discussed her kidney function is reduced and that I would recommend she focus on hydration regularly throughout the day.  She did mention to me that she has been experiencing some dysuria recently.  She has been taking some cranberry pills which has helped her symptoms.  She is unable to urinate in the office today, but she will take a urine sample cup and hat homes that she can provide a urine sample to the office so that we can test for UTI.     As far as health maintenance is concerned we did discuss tetanus and shingles vaccines.  She would like to hold off on use being administered today, but knows that if she does experience any kind of wound she should let me know and we can give a tetanus shot at her at that time.  Depression screening was negative.  Fall screening is probably moderate to high and we did discuss ways to prevent falls and that she should follow-up in emergency department if she were to fall especially she will hit her head.  Tells me she understands.  I see that DEXA scan was ordered by her  previous provider but has not been completed, I will asked my staff to get this scheduled so she can have her DEXA scan done.  Otherwise she is up-to-date with recommended screenings for a female of her age.   I have personally reviewed and noted the following in the patient's chart:   . Medical and social history . Use of alcohol, tobacco or illicit drugs  . Current medications and supplements . Functional ability and status . Nutritional status . Physical activity . Advanced directives . List of other physicians . Hospitalizations, surgeries, and ER visits in previous 12 months . Vitals . Screenings to include cognitive, depression, and falls . Referrals and appointments  In addition, I have reviewed and discussed with patient certain preventive protocols, quality metrics,  and best practice recommendations. A written personalized care plan for preventive services as well as general preventive health recommendations were provided to patient.    She will follow-up in 2 weeks for office visit and again in 1 year for annual wellness visit. Ailene Ards, NP   03/30/2020

## 2020-03-30 NOTE — Telephone Encounter (Signed)
Ms. Anselmo daughter was going to call Radiology at [facility] to schedule Dexa

## 2020-03-30 NOTE — Telephone Encounter (Signed)
I am going to reorder DEXA scan for patient so that she can be scheduled and so that the results come to me. This is to evaluate her osteoporosis.  Please make sure the scheduling occurs.  Thank you.

## 2020-04-13 DIAGNOSIS — R300 Dysuria: Secondary | ICD-10-CM | POA: Diagnosis not present

## 2020-04-16 ENCOUNTER — Other Ambulatory Visit (INDEPENDENT_AMBULATORY_CARE_PROVIDER_SITE_OTHER): Payer: Self-pay | Admitting: Nurse Practitioner

## 2020-04-16 DIAGNOSIS — N3001 Acute cystitis with hematuria: Secondary | ICD-10-CM

## 2020-04-16 LAB — URINALYSIS W MICROSCOPIC + REFLEX CULTURE
Bilirubin Urine: NEGATIVE
Glucose, UA: NEGATIVE
Hyaline Cast: NONE SEEN /LPF
Ketones, ur: NEGATIVE
Nitrites, Initial: POSITIVE — AB
Protein, ur: NEGATIVE
Specific Gravity, Urine: 1.01 (ref 1.001–1.03)
WBC, UA: >=60 /HPF — AB (ref 0–5)
pH: 5.5 (ref 5.0–8.0)

## 2020-04-16 LAB — CULTURE INDICATED

## 2020-04-16 LAB — URINE CULTURE

## 2020-04-16 MED ORDER — CIPROFLOXACIN HCL 250 MG PO TABS: 250.0000 mg | ORAL_TABLET | Freq: Two times a day (BID) | ORAL | 0 refills | Status: DC

## 2020-04-25 ENCOUNTER — Ambulatory Visit: Payer: Medicare Other | Admitting: Family Medicine

## 2020-05-27 ENCOUNTER — Encounter: Payer: Self-pay | Admitting: Podiatry

## 2020-05-27 ENCOUNTER — Ambulatory Visit (INDEPENDENT_AMBULATORY_CARE_PROVIDER_SITE_OTHER): Payer: Medicare Other | Admitting: Podiatry

## 2020-05-27 ENCOUNTER — Other Ambulatory Visit: Payer: Self-pay

## 2020-05-27 DIAGNOSIS — M79675 Pain in left toe(s): Secondary | ICD-10-CM | POA: Diagnosis not present

## 2020-05-27 DIAGNOSIS — B351 Tinea unguium: Secondary | ICD-10-CM

## 2020-05-27 DIAGNOSIS — M79674 Pain in right toe(s): Secondary | ICD-10-CM

## 2020-05-29 NOTE — Progress Notes (Signed)
Subjective: Brittany Mahoney is a pleasant 85 y.o. female patient seen today painful mycotic nails b/l that are difficult to trim. Pain interferes with ambulation. Aggravating factors include wearing enclosed shoe gear. Pain is relieved with periodic professional debridement.  She voices no new pedal concerns on today's visit.   PCP is Jeralyn Ruths, NP. Last visit was 03/30/2020. Patient Active Problem List   Diagnosis Date Noted  . Dysuria 10/26/2019  . Arthropathy, unspecified   . Osteoporosis   . Chronic kidney disease, stage 1   . Fracture of unspecified part of neck of right femur, sequela   . Other abnormalities of gait and mobility   . Other amnesia   . Pain in left knee   . Pain in right foot   . Pain in right shoulder   . Personal history of (healed) traumatic fracture   . Repeated falls   . Tremor, unspecified   . Chronic pain of left knee 08/25/2018  . Chronic pain of right knee 08/25/2018  . Urinary tract infection without hematuria 04/02/2018  . History of uterine cancer 04/01/2017  . Closed right hip fracture ([place]) 04/09/2016  . Hip fracture ([place]) 04/09/2016  . 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  . Endometrial cancer, grade I ([place]) 03/21/2015  . Well woman exam with routine gynecological exam 03/15/2014  . Screening for malignant neoplasm of the cervix 03/15/2014  . Hx of cancer of endometrium 03/06/2013    Current Outpatient Medications on File Prior to Visit  Medication Sig Dispense Refill  . alendronate (FOSAMAX) 70 MG tablet Take 1 tablet (70 mg total) by mouth every 7 (seven) days. 12 tablet 4  . Cholecalciferol 1.25 MG (50000 UT) TABS Take 5,000 Int'l Units by mouth daily.    . ciprofloxacin (CIPRO) 250 MG tablet Take 1 tablet (250 mg total) by mouth 2 (two) times daily. 6 tablet 0  . conjugated estrogens (PREMARIN) vaginal cream Place 1 Applicatorful vaginally 3 (three) times a week. For vaginal thinning and irritation 0.5 gram= 1/4 applicator 16.1 g 2  . Cranberry  500 MG CAPS Take 500 mg by mouth daily.    Marland Kitchen LUMIGAN 0.01 % SOLN INSTILL 1 DROP IN INTO BOTH EYES AT BEDTIME  6  . naproxen sodium (ALEVE) 220 MG tablet Take 220 mg by mouth daily as needed.    . NONFORMULARY OR COMPOUNDED ITEM Antifungal solution: Terbinafine 3%, Fluconazole 2%, Tea Tree Oil 5%, Urea 10%, Ibuprofen 2% in DMSO suspension #1mL 1 each 3   No current facility-administered medications on file prior to visit.    Allergies  Allergen Reactions  . Codeine   . Erythromycin   . Morphine And Related     loopy  . Penicillins Hives and Swelling    Has patient had a PCN reaction causing immediate rash, facial/tongue/throat swelling, SOB or lightheadedness with hypotension: Yes Has patient had a PCN reaction causing severe rash involving mucus membranes or skin necrosis: No Has patient had a PCN reaction that required hospitalization No Has patient had a PCN reaction occurring within the last 10 years: No  If all of the above answers are "NO", then may proceed with Cephalosporin use.     Objective: Physical Exam  General: Brittany Mahoney is a pleasant 85 y.o. Caucasian female, in NAD. AAO x 3.   Vascular:  Capillary refill time to digits immediate b/l. Palpable DP pulses b/l. Palpable PT pulses b/l. Pedal hair sparse b/l. Skin temperature gradient within normal limits b/l. No pain with calf  compression b/l.  Dermatological:  Pedal skin with normal turgor, texture and tone bilaterally. No open wounds bilaterally. No interdigital macerations bilaterally. Toenails 1-5 b/l elongated, discolored, dystrophic, thickened, crumbly with subungual debris and tenderness to dorsal palpation.  Musculoskeletal:  Normal muscle strength 5/5 to all lower extremity muscle groups bilaterally. No pain crepitus or joint limitation noted with ROM b/l. Hammertoes 2-5 b/l. Pes planovalgus with plantarmedial midfoot collapse b/l.   Neurological:  Protective sensation intact 5/5 intact bilaterally with  10g monofilament b/l. Vibratory sensation intact b/l. Proprioception intact bilaterally. Clonus negative b/l.  Assessment and Plan:  1. Pain due to onychomycosis of toenails of both feet    -Examined patient. -No new findings. No new orders. -Toenails 1-5 b/l were debrided in length and girth with sterile nail nippers and dremel without iatrogenic bleeding.  -Patient to report any pedal injuries to medical professional immediately. -Continue compounded topical antifungal from [place]. -Patient to continue soft, supportive shoe gear daily. -Patient/POA to call should there be question/concern in the interim.  Return in about 3 months (around 08/26/2020).  Marzetta Board, DPM

## 2020-06-01 ENCOUNTER — Encounter (INDEPENDENT_AMBULATORY_CARE_PROVIDER_SITE_OTHER): Payer: Self-pay | Admitting: Internal Medicine

## 2020-06-01 ENCOUNTER — Ambulatory Visit (INDEPENDENT_AMBULATORY_CARE_PROVIDER_SITE_OTHER): Payer: Medicare Other | Admitting: Internal Medicine

## 2020-06-01 ENCOUNTER — Other Ambulatory Visit (HOSPITAL_COMMUNITY): Payer: Self-pay | Admitting: Nurse Practitioner

## 2020-06-01 ENCOUNTER — Other Ambulatory Visit: Payer: Self-pay

## 2020-06-01 VITALS — BP 110/67 | Temp 98.3°F | Resp 18 | Ht 67.0 in | Wt 158.0 lb

## 2020-06-01 DIAGNOSIS — M810 Age-related osteoporosis without current pathological fracture: Secondary | ICD-10-CM

## 2020-06-01 DIAGNOSIS — E2839 Other primary ovarian failure: Secondary | ICD-10-CM

## 2020-06-01 DIAGNOSIS — R5381 Other malaise: Secondary | ICD-10-CM | POA: Diagnosis not present

## 2020-06-01 DIAGNOSIS — R5383 Other fatigue: Secondary | ICD-10-CM | POA: Diagnosis not present

## 2020-06-01 DIAGNOSIS — E559 Vitamin D deficiency, unspecified: Secondary | ICD-10-CM | POA: Diagnosis not present

## 2020-06-01 DIAGNOSIS — Z1231 Encounter for screening mammogram for malignant neoplasm of breast: Secondary | ICD-10-CM

## 2020-06-01 MED ORDER — PROGESTERONE MICRONIZED 100 MG PO CAPS: 100.0000 mg | ORAL_CAPSULE | Freq: Every day | ORAL | 3 refills | Status: DC

## 2020-06-01 MED ORDER — ESTRADIOL 0.5 MG PO TABS: 0.5000 mg | ORAL_TABLET | Freq: Every day | ORAL | 3 refills | Status: DC

## 2020-06-01 NOTE — Progress Notes (Signed)
Metrics: Intervention Frequency ACO  Documented Smoking Status Yearly  Screened one or more times in 24 months  Cessation Counseling or  Active cessation medication Past 24 months  Past 24 months   Guideline developer: UpToDate (See UpToDate for funding source) Date Released: 2014       Wellness Office Visit  Subjective:  Patient ID: Brittany Mahoney, female    DOB: Sep 26, 1934  Age: 85 y.o. MRN: [id number]  CC: This delightful lady comes in for follow-up of vitamin D deficiency and osteoporosis. HPI  She has been taking vitamin D3 5000 units daily as instructed by Judson Roch. She is due to have a bone density scan in about 3 weeks time.  Her previous bone density scan in 2017 showed a T score of -2.6 at the left femur and -2.9 at the left radius.  She has been taking Fosamax for several years. She is unsteady on her gait and describes urinary incontinence. She has a history of total hysterectomy with bilateral oophorectomy in the past.  This was for cervical cancer apparently. Past Medical History:  Diagnosis Date  . Age-related osteoporosis without current pathological fracture   . Arthritis   . Arthropathy, unspecified   . Broken hip ([place])   . Cancer ([place])    uterus  . Chronic kidney disease, stage 1   . Fracture of unspecified part of neck of right femur, sequela   . Glaucoma   . Headache(784.0)    migraines after periods  . Hepatitis   . Other abnormalities of gait and mobility   . Other amnesia   . Pain in left knee   . Pain in right foot   . Pain in right shoulder   . Personal history of (healed) traumatic fracture   . PMB (postmenopausal bleeding)   . Repeated falls   . Tremor, unspecified    Past Surgical History:  Procedure Laterality Date  . ABDOMINAL HYSTERECTOMY  2007   TAH and BSO  . COLONOSCOPY  07/31/2006   Dr.Rourk  . DILATION AND CURETTAGE OF UTERUS    . HIP FRACTURE SURGERY    . INTRAMEDULLARY (IM) NAIL INTERTROCHANTERIC Right 04/10/2016   Procedure:  INTRAMEDULLARY (IM) NAIL RIGHT HIP FRACTURE;  Surgeon: Mcarthur Rossetti, MD;  Location: [facility];  Service: Orthopedics;  Laterality: Right;     Family History  Problem Relation Age of Onset  . Stroke Maternal Grandfather   . Stroke Paternal Grandfather   . Cancer Mother   . Cancer Father        lymphatic sarcoma  . Early death Sister   . Early death Son   . Diabetes Son   . Heart disease Other   . Cancer Other   . Tuberculosis Other   . Diabetes Other   . Thyroid disease Other   . Stroke Other   . Heart disease Maternal Aunt   . 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   Social History   Tobacco Use  . Smoking status: Never Smoker  . Smokeless tobacco: Never Used  Substance Use Topics  . Alcohol use: No    Current Meds  Medication Sig  . alendronate (FOSAMAX) 70 MG tablet Take 1 tablet (70 mg total) by mouth every 7 (seven) days.  . Cholecalciferol 1.25 MG (50000 UT) TABS Take 5,000 Int'l Units by mouth daily.  Marland Kitchen  Cranberry 500 MG CAPS Take 500 mg by mouth daily.  Marland Kitchen LUMIGAN 0.01 % SOLN INSTILL 1 DROP IN INTO BOTH EYES AT BEDTIME  . naproxen sodium (ALEVE) 220 MG tablet Take 220 mg by mouth daily as needed.  . NONFORMULARY OR COMPOUNDED ITEM Antifungal solution: Terbinafine 3%, Fluconazole 2%, Tea Tree Oil 5%, Urea 10%, Ibuprofen 2% in DMSO suspension #54mL      Depression screen [facility] 2/9 03/30/2020 10/26/2019 04/01/2017  Decreased Interest 0 0 1  Down, Depressed, Hopeless 0 0 0  PHQ - 2 Score 0 0 1     Objective:   Today's Vitals: BP 110/67   Temp 98.3 F (36.8 C) (Temporal)   Resp 18   Ht 5\' 7"  (1.702 m)   Wt 158 lb (71.7 kg)   BMI 24.75 kg/m  Vitals with BMI 06/01/2020 03/30/2020 02/29/2020  Height 5\' 7"  5\' 7"  5\' 7"   Weight 158 lbs 160 lbs 161 lbs 6 oz  BMI 24.74 83.15 17.61  Systolic 607  371 062  Diastolic 67 80 80  Pulse - 95 94     Physical Exam  She looks systemically well but somewhat frail.  Blood pressure is excellent.     Assessment   1. Osteoporosis without current pathological fracture, unspecified osteoporosis type   2. Primary ovarian failure   3. Vitamin D deficiency disease   4. Malaise and fatigue       Tests ordered Orders Placed This Encounter  Procedures  . Progesterone  . Estradiol  . Testos,Total,Free and SHBG (Female)  . COMPLETE METABOLIC PANEL WITH GFR  . VITAMIN D 25 Hydroxy (Vit-D Deficiency, Fractures)  . T3, free     Plan: 1. I explained that Fosamax will not build bone but will delay progression of her osteoporosis.  We will see what the bone density scan shows but I also then discussed the use of bioidentical hormone therapy to improve bone density especially estradiol which will improve bone density by 2% every year that she takes it.  I explained the difference between synthetic and bioidentical hormones and referenced the flaws in the women's health initiative study.  I explained possible side effects and how to deal with them.  She is willing to try bioidentical hormones and I have started her on estradiol and progesterone and measuring baseline levels.  I am also measuring baseline testosterone levels as this may be a therapeutic option in the future for her. 2. She will continue with vitamin D3 5000 units daily and we will check levels today. 3. I will also check T3 levels as she describes significant fatigue.  I see that the TSH was above 2 previously. 4. Follow-up in about 6 weeks to see how she is doing.  I spent 45 minutes with this patient today explaining bioidentical hormone therapy and reviewing her results from previous investigations.   Meds ordered this encounter  Medications  . estradiol (ESTRACE) 0.5 MG tablet    Sig: Take 1 tablet (0.5 mg total) by mouth daily.    Dispense:  30 tablet    Refill:  3  .  progesterone (PROMETRIUM) 100 MG capsule    Sig: Take 1 capsule (100 mg total) by mouth daily.    Dispense:  30 capsule    Refill:  3    Nyelli Samara Luther Parody, MD

## 2020-06-05 LAB — T3, FREE: T3, Free: 3.3 pg/mL (ref 2.3–4.2)

## 2020-06-05 LAB — VITAMIN D 25 HYDROXY (VIT D DEFICIENCY, FRACTURES): Vit D, 25-Hydroxy: 38 ng/mL (ref 30–100)

## 2020-06-05 LAB — TESTOS,TOTAL,FREE AND SHBG (FEMALE)
Free Testosterone: 1 pg/mL (ref 0.2–3.7)
Sex Hormone Binding: 98 nmol/L — ABNORMAL HIGH (ref 14–73)
Testosterone, Total, LC-MS-MS: 18 ng/dL (ref 2–45)

## 2020-06-05 LAB — COMPLETE METABOLIC PANEL WITH GFR
AG Ratio: 1.4 (calc) (ref 1.0–2.5)
ALT: 13 U/L (ref 6–29)
AST: 12 U/L (ref 10–35)
Albumin: 4.3 g/dL (ref 3.6–5.1)
Alkaline phosphatase (APISO): 77 U/L (ref 37–153)
BUN/Creatinine Ratio: 13 (calc) (ref 6–22)
BUN: 12 mg/dL (ref 7–25)
CO2: 26 mmol/L (ref 20–32)
Calcium: 9.9 mg/dL (ref 8.6–10.4)
Chloride: 106 mmol/L (ref 98–110)
Creat: 0.89 mg/dL — ABNORMAL HIGH (ref 0.60–0.88)
GFR, Est African American: 69 mL/min/{1.73_m2} (ref >=60)
GFR, Est Non African American: 60 mL/min/{1.73_m2} (ref >=60)
Globulin: 3 g/dL (calc) (ref 1.9–3.7)
Glucose, Bld: 85 mg/dL (ref 65–99)
Potassium: 4 mmol/L (ref 3.5–5.3)
Sodium: 144 mmol/L (ref 135–146)
Total Bilirubin: 1.3 mg/dL — ABNORMAL HIGH (ref 0.2–1.2)
Total Protein: 7.3 g/dL (ref 6.1–8.1)

## 2020-06-05 LAB — ESTRADIOL: Estradiol: <15 pg/mL

## 2020-06-05 LAB — PROGESTERONE: Progesterone: <0.5 ng/mL

## 2020-06-16 ENCOUNTER — Ambulatory Visit (HOSPITAL_COMMUNITY)
Admission: RE | Admit: 2020-06-16 | Discharge: 2020-06-16 | Disposition: A | Discharge status: Home / Self Care | Payer: Medicare Other | Source: Ambulatory Visit | Attending: Nurse Practitioner | Admitting: Nurse Practitioner

## 2020-06-16 ENCOUNTER — Other Ambulatory Visit: Payer: Self-pay

## 2020-06-16 DIAGNOSIS — R29890 Loss of height: Secondary | ICD-10-CM | POA: Diagnosis not present

## 2020-06-16 DIAGNOSIS — Z1231 Encounter for screening mammogram for malignant neoplasm of breast: Secondary | ICD-10-CM

## 2020-06-16 DIAGNOSIS — Z780 Asymptomatic menopausal state: Secondary | ICD-10-CM | POA: Diagnosis not present

## 2020-06-16 DIAGNOSIS — M810 Age-related osteoporosis without current pathological fracture: Secondary | ICD-10-CM | POA: Diagnosis not present

## 2020-06-16 DIAGNOSIS — Z8739 Personal history of other diseases of the musculoskeletal system and connective tissue: Secondary | ICD-10-CM | POA: Diagnosis not present

## 2020-06-17 ENCOUNTER — Encounter (INDEPENDENT_AMBULATORY_CARE_PROVIDER_SITE_OTHER): Payer: Self-pay | Admitting: Nurse Practitioner

## 2020-07-13 ENCOUNTER — Other Ambulatory Visit: Payer: Self-pay

## 2020-07-13 ENCOUNTER — Encounter (INDEPENDENT_AMBULATORY_CARE_PROVIDER_SITE_OTHER): Payer: Self-pay | Admitting: Internal Medicine

## 2020-07-13 ENCOUNTER — Ambulatory Visit (INDEPENDENT_AMBULATORY_CARE_PROVIDER_SITE_OTHER): Payer: Medicare Other | Admitting: Internal Medicine

## 2020-07-13 VITALS — BP 131/68 | HR 85 | Temp 97.5°F | Resp 18 | Ht 67.0 in | Wt 158.0 lb

## 2020-07-13 DIAGNOSIS — R5381 Other malaise: Secondary | ICD-10-CM

## 2020-07-13 DIAGNOSIS — H401132 Primary open-angle glaucoma, bilateral, moderate stage: Secondary | ICD-10-CM | POA: Diagnosis not present

## 2020-07-13 DIAGNOSIS — R5383 Other fatigue: Secondary | ICD-10-CM

## 2020-07-13 DIAGNOSIS — E559 Vitamin D deficiency, unspecified: Secondary | ICD-10-CM | POA: Diagnosis not present

## 2020-07-13 DIAGNOSIS — E2839 Other primary ovarian failure: Secondary | ICD-10-CM | POA: Diagnosis not present

## 2020-07-13 DIAGNOSIS — M810 Age-related osteoporosis without current pathological fracture: Secondary | ICD-10-CM

## 2020-07-13 MED ORDER — FIRST-TESTOSTERONE MC 2 % TD CREA: 5.0000 mg | TOPICAL_CREAM | Freq: Every day | TRANSDERMAL | 0 refills | Status: DC

## 2020-07-13 NOTE — Progress Notes (Signed)
Metrics: Intervention Frequency ACO  Documented Smoking Status Yearly  Screened one or more times in 24 months  Cessation Counseling or  Active cessation medication Past 24 months  Past 24 months   Guideline developer: UpToDate (See UpToDate for funding source) Date Released: 2014       Wellness Office Visit  Subjective:  Patient ID: Brittany Mahoney, female    DOB: October 25, 1934  Age: 85 y.o. MRN: [id number]  CC: This lady comes in for treatment of osteoporosis and bioidentical hormone therapy. HPI  She has tolerated low-dose estradiol and progesterone and does seem to feel overall better but still lacks the energy although her focus seems to be somewhat better. Testosterone levels were suboptimal when they were checked.  She would like to feel better and have overall wellbeing and energy. Her vitamin D levels have improved but still suboptimal.  She is taking vitamin D3 5000 units daily. Past Medical History:  Diagnosis Date  . Age-related osteoporosis without current pathological fracture   . Arthritis   . Arthropathy, unspecified   . Broken hip ([place])   . Cancer ([place])    uterus  . Chronic kidney disease, stage 1   . Fracture of unspecified part of neck of right femur, sequela   . Glaucoma   . Headache(784.0)    migraines after periods  . Hepatitis   . Osteoporosis   . Other abnormalities of gait and mobility   . Other amnesia   . Pain in left knee   . Pain in right foot   . Pain in right shoulder   . Personal history of (healed) traumatic fracture   . PMB (postmenopausal bleeding)   . Repeated falls   . Tremor, unspecified    Past Surgical History:  Procedure Laterality Date  . ABDOMINAL HYSTERECTOMY  2007   TAH and BSO  . COLONOSCOPY  07/31/2006   Dr.Rourk  . DILATION AND CURETTAGE OF UTERUS    . HIP FRACTURE SURGERY    . INTRAMEDULLARY (IM) NAIL INTERTROCHANTERIC Right 04/10/2016   Procedure: INTRAMEDULLARY (IM) NAIL RIGHT HIP FRACTURE;  Surgeon: Mcarthur Rossetti, MD;  Location: [facility];  Service: Orthopedics;  Laterality: Right;     Family History  Problem Relation Age of Onset  . Stroke Maternal Grandfather   . Stroke Paternal Grandfather   . Cancer Mother   . Cancer Father        lymphatic sarcoma  . Early death Sister   . Early death Son   . Diabetes Son   . Heart disease Other   . Cancer Other   . Tuberculosis Other   . Diabetes Other   . Thyroid disease Other   . Stroke Other   . Heart disease Maternal Aunt   . 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   Social History   Tobacco Use  . Smoking status: Never Smoker  . Smokeless tobacco: Never Used  Substance Use Topics  . Alcohol use: No    Current Meds  Medication Sig  . alendronate (FOSAMAX) 70 MG tablet Take 1 tablet (70 mg total) by mouth every 7 (seven) days.  . Cholecalciferol 1.25 MG (50000 UT) TABS Take 5,000 Int'l Units by mouth daily.  Marland Kitchen conjugated estrogens (PREMARIN) vaginal cream Place 1 Applicatorful vaginally 3 (three) times a week. For vaginal thinning and irritation  0.5 gram= 1/4 applicator  . Cranberry 500 MG CAPS Take 500 mg by mouth daily.  Marland Kitchen estradiol (ESTRACE) 0.5 MG tablet Take 1 tablet (0.5 mg total) by mouth daily.  Marland Kitchen LUMIGAN 0.01 % SOLN INSTILL 1 DROP IN INTO BOTH EYES AT BEDTIME  . naproxen sodium (ALEVE) 220 MG tablet Take 220 mg by mouth daily as needed.  . NONFORMULARY OR COMPOUNDED ITEM Antifungal solution: Terbinafine 3%, Fluconazole 2%, Tea Tree Oil 5%, Urea 10%, Ibuprofen 2% in DMSO suspension #38mL  . progesterone (PROMETRIUM) 100 MG capsule Take 1 capsule (100 mg total) by mouth daily.      Depression screen [facility] 2/9 03/30/2020 10/26/2019 04/01/2017  Decreased Interest 0 0 1  Down, Depressed, Hopeless 0 0 0  PHQ - 2 Score 0 0 1     Objective:   Today's Vitals: BP  131/68 (BP Location: Right Arm, Patient Position: Sitting, Cuff Size: Normal)   Pulse 85   Temp (!) 97.5 F (36.4 C) (Temporal)   Resp 18   Ht 5\' 7"  (1.702 m)   Wt 158 lb (71.7 kg)   SpO2 98%   BMI 24.75 kg/m  Vitals with BMI 07/13/2020 06/01/2020 03/30/2020  Height 5\' 7"  5\' 7"  5\' 7"   Weight 158 lbs 158 lbs 160 lbs  BMI 24.74 82.80 03.49  Systolic 179 150 569  Diastolic 68 67 80  Pulse 85 - 95     Physical Exam  She looks systemically well.  No new physical changes.     Assessment   1. Primary ovarian failure   2. Osteoporosis without current pathological fracture, unspecified osteoporosis type   3. Malaise and fatigue   4. Vitamin D deficiency disease       Tests ordered Orders Placed This Encounter  Procedures  . Estradiol  . Progesterone  . N-Telopeptide, Cross-linked, Serum     Plan: 1. She will continue at present dose of estradiol and progesterone and I will check levels to see if we can optimize further. 2. I have recommended she increase the vitamin D3 to 10,000 units daily. 3. I am going to start her on testosterone cream 5 mg applied to the labia, off label, for symptoms of wellbeing, energy, focus and concentration and help improve osteoporosis.  We will check baseline telopeptide levels. 4. High-dose influenza vaccination was given. 5. Follow-up in 2 months to see how she is doing and we may check levels again.   Meds ordered this encounter  Medications  . Testosterone Propionate (FIRST-TESTOSTERONE MC) 2 % CREA    Sig: Place 5 mg onto the skin daily.    Dispense:  30 g    Refill:  0    Ames Hoban Luther Parody, MD

## 2020-07-14 ENCOUNTER — Other Ambulatory Visit (INDEPENDENT_AMBULATORY_CARE_PROVIDER_SITE_OTHER): Payer: Self-pay | Admitting: Internal Medicine

## 2020-07-14 MED ORDER — ESTRADIOL 1 MG PO TABS: 1.0000 mg | ORAL_TABLET | Freq: Every day | ORAL | 3 refills | Status: DC

## 2020-07-20 LAB — N-TELOPEPTIDE, CROSS-LINKED, SERUM: N-Telopeptide: 12.5 nmol{BCE} (ref 6.2–19.0)

## 2020-07-20 LAB — PROGESTERONE: Progesterone: 11.5 ng/mL

## 2020-07-20 LAB — ESTRADIOL: Estradiol: 30 pg/mL

## 2020-08-26 ENCOUNTER — Encounter: Payer: Self-pay | Admitting: Podiatry

## 2020-08-26 ENCOUNTER — Ambulatory Visit (INDEPENDENT_AMBULATORY_CARE_PROVIDER_SITE_OTHER): Payer: Medicare Other | Admitting: Podiatry

## 2020-08-26 ENCOUNTER — Other Ambulatory Visit: Payer: Self-pay

## 2020-08-26 DIAGNOSIS — M79675 Pain in left toe(s): Secondary | ICD-10-CM

## 2020-08-26 DIAGNOSIS — M79674 Pain in right toe(s): Secondary | ICD-10-CM

## 2020-08-26 DIAGNOSIS — B351 Tinea unguium: Secondary | ICD-10-CM

## 2020-08-29 NOTE — Progress Notes (Signed)
Subjective: Brittany Mahoney is a pleasant 85 y.o. female patient seen today painful mycotic nails b/l that are difficult to trim. Pain interferes with ambulation. Aggravating factors include wearing enclosed shoe gear. Pain is relieved with periodic professional debridement.   Daughter is present during today's visit.  She voices no new pedal concerns on today's visit.   PCP is Brittany Ruths, NP. Last visit was 09/22//2021. Patient Active Problem List   Diagnosis Date Noted  . Dysuria 10/26/2019  . Arthropathy, unspecified   . Osteoporosis   . Chronic kidney disease, stage 1   . Fracture of unspecified part of neck of right femur, sequela   . Other abnormalities of gait and mobility   . Other amnesia   . Pain in left knee   . Pain in right foot   . Pain in right shoulder   . Personal history of (healed) traumatic fracture   . Repeated falls   . Tremor, unspecified   . Chronic pain of left knee 08/25/2018  . Chronic pain of right knee 08/25/2018  . Urinary tract infection without hematuria 04/02/2018  . History of uterine cancer 04/01/2017  . Closed right hip fracture ([place]) 04/09/2016  . Hip fracture ([place]) 04/09/2016  . 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  . Endometrial cancer, grade I ([place]) 03/21/2015  . Well woman exam with routine gynecological exam 03/15/2014  . Screening for malignant neoplasm of the cervix 03/15/2014  . Hx of cancer of endometrium 03/06/2013    Current Outpatient Medications on File Prior to Visit  Medication Sig Dispense Refill  . alendronate (FOSAMAX) 70 MG tablet Take 1 tablet (70 mg total) by mouth every 7 (seven) days. 12 tablet 4  . Cholecalciferol 1.25 MG (50000 UT) TABS Take 5,000 Int'l Units by mouth daily.    . ciprofloxacin (CIPRO) 250 MG tablet Take 1 tablet (250 mg total) by mouth 2 (two) times daily. (Patient not taking: Reported on 07/13/2020) 6 tablet 0  . conjugated estrogens (PREMARIN) vaginal cream Place 1 Applicatorful vaginally 3 (three) times a  week. For vaginal thinning and irritation 0.5 gram= 1/4 applicator 35.5 g 2  . Cranberry 500 MG CAPS Take 500 mg by mouth daily.    Marland Kitchen estradiol (ESTRACE) 1 MG tablet Take 1 tablet (1 mg total) by mouth daily. 30 tablet 3  . LUMIGAN 0.01 % SOLN INSTILL 1 DROP IN INTO BOTH EYES AT BEDTIME  6  . naproxen sodium (ALEVE) 220 MG tablet Take 220 mg by mouth daily as needed.    . NONFORMULARY OR COMPOUNDED ITEM Antifungal solution: Terbinafine 3%, Fluconazole 2%, Tea Tree Oil 5%, Urea 10%, Ibuprofen 2% in DMSO suspension #59mL 1 each 3  . progesterone (PROMETRIUM) 100 MG capsule Take 1 capsule (100 mg total) by mouth daily. 30 capsule 3  . Testosterone Propionate (FIRST-TESTOSTERONE MC) 2 % CREA Place 5 mg onto the skin daily. 30 g 0   No current facility-administered medications on file prior to visit.    Allergies  Allergen Reactions  . Codeine   . Erythromycin   . Morphine And Related     loopy  . Penicillins Hives and Swelling    Has patient had a PCN reaction causing immediate rash, facial/tongue/throat swelling, SOB or lightheadedness with hypotension: Yes Has patient had a PCN reaction causing severe rash involving mucus membranes or skin necrosis: No Has patient had a PCN reaction that required hospitalization No Has patient had a PCN reaction occurring within the last 10 years: No  If  all of the above answers are "NO", then may proceed with Cephalosporin use.     Objective: Physical Exam  General: Brittany Mahoney is a pleasant 85 y.o. Caucasian female, in NAD. AAO x 3.   Vascular:  Capillary refill time to digits immediate b/l. Palpable DP pulses b/l. Palpable PT pulses b/l. Pedal hair sparse b/l. Skin temperature gradient within normal limits b/l. No pain with calf compression b/l.  Dermatological:  Pedal skin with normal turgor, texture and tone bilaterally. No open wounds bilaterally. No interdigital macerations bilaterally. Toenails 1-5 b/l elongated, discolored, dystrophic,  thickened, crumbly with subungual debris and tenderness to dorsal palpation.  Musculoskeletal:  Normal muscle strength 5/5 to all lower extremity muscle groups bilaterally. No pain crepitus or joint limitation noted with ROM b/l. Hammertoes 2-5 b/l. Pes planovalgus with plantarmedial midfoot collapse b/l.   Neurological:  Protective sensation intact 5/5 intact bilaterally with 10g monofilament b/l. Vibratory sensation intact b/l. Proprioception intact bilaterally. Clonus negative b/l.  Assessment and Plan:  1. Pain due to onychomycosis of toenails of both feet    -Examined patient. -No new findings. No new orders. -Toenails 1-5 b/l were debrided in length and girth with sterile nail nippers and dremel without iatrogenic bleeding.  -Patient to report any pedal injuries to medical professional immediately. -Continue compounded topical antifungal from [place]. -Patient to continue soft, supportive shoe gear daily. -Patient/POA to call should there be question/concern in the interim.  Return in about 3 months (around 11/24/2020).  Marzetta Board, DPM

## 2020-09-14 ENCOUNTER — Encounter (INDEPENDENT_AMBULATORY_CARE_PROVIDER_SITE_OTHER): Payer: Self-pay | Admitting: Internal Medicine

## 2020-09-14 ENCOUNTER — Ambulatory Visit (INDEPENDENT_AMBULATORY_CARE_PROVIDER_SITE_OTHER): Payer: Medicare Other | Admitting: Internal Medicine

## 2020-09-14 ENCOUNTER — Other Ambulatory Visit: Payer: Self-pay

## 2020-09-14 VITALS — BP 120/64 | HR 82 | Temp 97.0°F | Resp 18 | Ht 67.0 in | Wt 154.0 lb

## 2020-09-14 DIAGNOSIS — M810 Age-related osteoporosis without current pathological fracture: Secondary | ICD-10-CM

## 2020-09-14 DIAGNOSIS — E2839 Other primary ovarian failure: Secondary | ICD-10-CM

## 2020-09-14 DIAGNOSIS — E559 Vitamin D deficiency, unspecified: Secondary | ICD-10-CM | POA: Diagnosis not present

## 2020-09-14 NOTE — Progress Notes (Signed)
Metrics: Intervention Frequency ACO  Documented Smoking Status Yearly  Screened one or more times in 24 months  Cessation Counseling or  Active cessation medication Past 24 months  Past 24 months   Guideline developer: UpToDate (See UpToDate for funding source) Date Released: 2014       Wellness Office Visit  Subjective:  Patient ID: Brittany Mahoney, female    DOB: Feb 26, 1935  Age: 86 y.o. MRN: [id number]  CC: This lady comes in for follow-up of therapy for osteoporosis and bioidentical hormone therapy as well as vitamin D deficiency. HPI  She has not been consistent with taking testosterone cream but she has been taking it for the last week.  She describes vagina itching.  She tolerated the higher dose of estradiol and continues with progesterone as it was before. She also describes unsteady gait and fear of falling backwards.  She has seen neurology in 2019 with the possibility of parkinsonism but it was felt that she had essential tremor.  The symptoms have not progressively got worse. Past Medical History:  Diagnosis Date  . Age-related osteoporosis without current pathological fracture   . Arthritis   . Arthropathy, unspecified   . Broken hip ([place])   . Cancer ([place])    uterus  . Chronic kidney disease, stage 1   . Fracture of unspecified part of neck of right femur, sequela   . Glaucoma   . Headache(784.0)    migraines after periods  . Hepatitis   . Osteoporosis   . Other abnormalities of gait and mobility   . Other amnesia   . Pain in left knee   . Pain in right foot   . Pain in right shoulder   . Personal history of (healed) traumatic fracture   . PMB (postmenopausal bleeding)   . Repeated falls   . Tremor, unspecified    Past Surgical History:  Procedure Laterality Date  . ABDOMINAL HYSTERECTOMY  2007   TAH and BSO  . COLONOSCOPY  07/31/2006   Dr.Rourk  . DILATION AND CURETTAGE OF UTERUS    . HIP FRACTURE SURGERY    . INTRAMEDULLARY (IM) NAIL INTERTROCHANTERIC  Right 04/10/2016   Procedure: INTRAMEDULLARY (IM) NAIL RIGHT HIP FRACTURE;  Surgeon: Mcarthur Rossetti, MD;  Location: [place];  Service: Orthopedics;  Laterality: Right;     Family History  Problem Relation Age of Onset  . Stroke Maternal Grandfather   . Stroke Paternal Grandfather   . Cancer Mother   . Cancer Father        lymphatic sarcoma  . Early death Sister   . Early death Son   . Diabetes Son   . Heart disease Other   . Cancer Other   . Tuberculosis Other   . Diabetes Other   . Thyroid disease Other   . Stroke Other   . Heart disease Maternal Aunt   . 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   Social History   Tobacco Use  . Smoking status: Never Smoker  . Smokeless tobacco: Never Used  Substance Use Topics  . Alcohol use: No    Current Meds  Medication Sig  . alendronate (FOSAMAX) 70 MG tablet Take 1 tablet (70 mg total) by mouth every 7 (seven) days.  . Cholecalciferol 1.25 MG (50000 UT) TABS Take 5,000 Int'l Units by mouth daily.  Marland Kitchen  ciprofloxacin (CIPRO) 250 MG tablet Take 1 tablet (250 mg total) by mouth 2 (two) times daily.  Marland Kitchen conjugated estrogens (PREMARIN) vaginal cream Place 1 Applicatorful vaginally 3 (three) times a week. For vaginal thinning and irritation 0.5 gram= 1/4 applicator  . Cranberry 500 MG CAPS Take 500 mg by mouth daily.  Marland Kitchen estradiol (ESTRACE) 1 MG tablet Take 1 tablet (1 mg total) by mouth daily.  Marland Kitchen LUMIGAN 0.01 % SOLN INSTILL 1 DROP IN INTO BOTH EYES AT BEDTIME  . naproxen sodium (ALEVE) 220 MG tablet Take 220 mg by mouth daily as needed.  . NONFORMULARY OR COMPOUNDED ITEM Antifungal solution: Terbinafine 3%, Fluconazole 2%, Tea Tree Oil 5%, Urea 10%, Ibuprofen 2% in DMSO suspension #47mL  . progesterone (PROMETRIUM) 100 MG capsule Take 1 capsule (100 mg total) by mouth daily.   . Testosterone Propionate (FIRST-TESTOSTERONE MC) 2 % CREA Place 5 mg onto the skin daily.      Depression screen [facility] 2/9 03/30/2020 10/26/2019 04/01/2017  Decreased Interest 0 0 1  Down, Depressed, Hopeless 0 0 0  PHQ - 2 Score 0 0 1     Objective:   Today's Vitals: BP 120/64 (BP Location: Right Arm, Patient Position: Sitting, Cuff Size: Normal)   Pulse 82   Temp (!) 97 F (36.1 C)   Resp 18   Ht 5\' 7"  (1.702 m)   Wt 154 lb (69.9 kg)   SpO2 96%   BMI 24.12 kg/m  Vitals with BMI 09/14/2020 07/13/2020 06/01/2020  Height 5\' 7"  5\' 7"  5\' 7"   Weight 154 lbs 158 lbs 158 lbs  BMI 24.11 24.74 24.74  Systolic 120 131 06/03/2020  Diastolic 64 68 67  Pulse 82 85 -     Physical Exam  She looks systemically well.  No new physical findings except examination of her neurologically does show some rigidity on distraction maneuvers in her arms.  She does not have the classical pill-rolling.     Assessment   1. Primary ovarian failure   2. Osteoporosis without current pathological fracture, unspecified osteoporosis type   3. Vitamin D deficiency disease       Tests ordered Orders Placed This Encounter  Procedures  . Progesterone  . Estradiol  . COMPLETE METABOLIC PANEL WITH GFR  . VITAMIN D 25 Hydroxy (Vit-D Deficiency, Fractures)     Plan: 1. We will check estradiol and progesterone levels to see if we need to make further adjustments to her doses. 2. I will check vitamin D levels today also. 3. I mention the possibility of trying to use DHEA 15 mg daily which is over-the-counter which might actually help her with mobility. 4. Prevnar 13 vaccination was given today. 5. Follow-up in 2 months.  I recommended that she does get COVID-19 booster soon as possible.   No orders of the defined types were placed in this encounter.   , MD

## 2020-09-15 ENCOUNTER — Other Ambulatory Visit (INDEPENDENT_AMBULATORY_CARE_PROVIDER_SITE_OTHER): Payer: Self-pay | Admitting: Internal Medicine

## 2020-09-15 LAB — COMPLETE METABOLIC PANEL WITH GFR
AG Ratio: 1.4 (calc) (ref 1.0–2.5)
ALT: 14 U/L (ref 6–29)
AST: 13 U/L (ref 10–35)
Albumin: 4.3 g/dL (ref 3.6–5.1)
Alkaline phosphatase (APISO): 88 U/L (ref 37–153)
BUN/Creatinine Ratio: 14 (calc) (ref 6–22)
BUN: 14 mg/dL (ref 7–25)
CO2: 24 mmol/L (ref 20–32)
Calcium: 10.4 mg/dL (ref 8.6–10.4)
Chloride: 107 mmol/L (ref 98–110)
Creat: 0.98 mg/dL — ABNORMAL HIGH (ref 0.60–0.88)
GFR, Est African American: 62 mL/min/{1.73_m2} (ref >=60)
GFR, Est Non African American: 53 mL/min/{1.73_m2} — ABNORMAL LOW (ref >=60)
Globulin: 3.1 g/dL (calc) (ref 1.9–3.7)
Glucose, Bld: 83 mg/dL (ref 65–139)
Potassium: 4.6 mmol/L (ref 3.5–5.3)
Sodium: 141 mmol/L (ref 135–146)
Total Bilirubin: 1.1 mg/dL (ref 0.2–1.2)
Total Protein: 7.4 g/dL (ref 6.1–8.1)

## 2020-09-15 LAB — VITAMIN D 25 HYDROXY (VIT D DEFICIENCY, FRACTURES): Vit D, 25-Hydroxy: 88 ng/mL (ref 30–100)

## 2020-09-15 LAB — PROGESTERONE: Progesterone: 8.6 ng/mL

## 2020-09-15 LAB — ESTRADIOL: Estradiol: 57 pg/mL

## 2020-09-15 MED ORDER — ESTRADIOL 1 MG PO TABS: 1.5000 mg | ORAL_TABLET | Freq: Every day | ORAL | 3 refills | Status: DC

## 2020-09-15 MED ORDER — PROGESTERONE 200 MG PO CAPS: 200.0000 mg | ORAL_CAPSULE | Freq: Every day | ORAL | 3 refills | Status: DC

## 2020-11-16 ENCOUNTER — Encounter (INDEPENDENT_AMBULATORY_CARE_PROVIDER_SITE_OTHER): Payer: Self-pay | Admitting: Internal Medicine

## 2020-11-16 ENCOUNTER — Ambulatory Visit (INDEPENDENT_AMBULATORY_CARE_PROVIDER_SITE_OTHER): Payer: Medicare Other | Admitting: Internal Medicine

## 2020-11-16 ENCOUNTER — Other Ambulatory Visit: Payer: Self-pay

## 2020-11-16 VITALS — BP 136/74 | HR 84 | Temp 97.9°F | Resp 18 | Ht 67.0 in | Wt 152.6 lb

## 2020-11-16 DIAGNOSIS — R5381 Other malaise: Secondary | ICD-10-CM | POA: Diagnosis not present

## 2020-11-16 DIAGNOSIS — E2839 Other primary ovarian failure: Secondary | ICD-10-CM

## 2020-11-16 DIAGNOSIS — R5383 Other fatigue: Secondary | ICD-10-CM | POA: Diagnosis not present

## 2020-11-16 NOTE — Progress Notes (Signed)
Metrics: Intervention Frequency ACO  Documented Smoking Status Yearly  Screened one or more times in 24 months  Cessation Counseling or  Active cessation medication Past 24 months  Past 24 months   Guideline developer: UpToDate (See UpToDate for funding source) Date Released: 2014       Wellness Office Visit  Subjective:  Patient ID: Brittany Mahoney, female    DOB: [birth year]  Age: 86 y.o. MRN: [id number]  CC: This lady comes in for follow-up of identical hormone therapy. HPI  She has tolerated the higher dose of estradiol and progesterone without any problems.  She continues on same dose of testosterone.  Unfortunately, she did not start DHEA as we have discussed previously.  Because of her daughter's work schedule, she has not been able to go outside for a walk which is what she would like to do.  The daughter is worried to leave her by herself. Past Medical History:  Diagnosis Date  . Age-related osteoporosis without current pathological fracture   . Arthritis   . Arthropathy, unspecified   . Broken hip ([place])   . Cancer ([place])    uterus  . Chronic kidney disease, stage 1   . Fracture of unspecified part of neck of right femur, sequela   . Glaucoma   . Headache(784.0)    migraines after periods  . Hepatitis   . Osteoporosis   . Other abnormalities of gait and mobility   . Other amnesia   . Pain in left knee   . Pain in right foot   . Pain in right shoulder   . Personal history of (healed) traumatic fracture   . PMB (postmenopausal bleeding)   . Repeated falls   . Tremor, unspecified    Past Surgical History:  Procedure Laterality Date  . ABDOMINAL HYSTERECTOMY  2007   TAH and BSO  . COLONOSCOPY  07/31/2006   Dr.Rourk  . DILATION AND CURETTAGE OF UTERUS    . HIP FRACTURE SURGERY    . INTRAMEDULLARY (IM) NAIL INTERTROCHANTERIC Right 04/10/2016   Procedure: INTRAMEDULLARY (IM) NAIL RIGHT HIP FRACTURE;  Surgeon: Mcarthur Rossetti, MD;  Location: [facility];  Service:  Orthopedics;  Laterality: Right;     Family History  Problem Relation Age of Onset  . Stroke Maternal Grandfather   . Stroke Paternal Grandfather   . Cancer Mother   . Cancer Father        lymphatic sarcoma  . Early death Sister   . Early death Son   . Diabetes Son   . Heart disease Other   . Cancer Other   . Tuberculosis Other   . Diabetes Other   . Thyroid disease Other   . Stroke Other   . Heart disease Maternal Aunt   . 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   Social History   Tobacco Use  . Smoking status: Never Smoker  . Smokeless tobacco: Never Used  Substance Use Topics  . Alcohol use: No    Current Meds  Medication Sig  . Cholecalciferol 1.25 MG (50000 UT) TABS Take 5,000 Int'l Units by mouth daily.  . Cranberry 500 MG CAPS Take 500 mg by mouth daily.  Marland Kitchen estradiol (ESTRACE) 1 MG tablet Take 1.5 tablets (1.5 mg total) by mouth daily.  Marland Kitchen LUMIGAN 0.01 % SOLN INSTILL 1 DROP IN INTO  BOTH EYES AT BEDTIME  . naproxen sodium (ALEVE) 220 MG tablet Take 220 mg by mouth daily as needed.  . NONFORMULARY OR COMPOUNDED ITEM Antifungal solution: Terbinafine 3%, Fluconazole 2%, Tea Tree Oil 5%, Urea 10%, Ibuprofen 2% in DMSO suspension #44mL  . progesterone (PROMETRIUM) 200 MG capsule Take 1 capsule (200 mg total) by mouth daily.  . Testosterone Propionate (FIRST-TESTOSTERONE MC) 2 % CREA Place 5 mg onto the skin daily.       Objective:   Today's Vitals: BP 136/74 (BP Location: Right Arm, Patient Position: Sitting, Cuff Size: Normal)   Pulse 84   Temp 97.9 F (36.6 C) (Temporal)   Resp 18   Ht 5\' 7"  (1.702 m)   Wt 152 lb 9.6 oz (69.2 kg)   SpO2 98%   BMI 23.90 kg/m  Vitals with BMI 11/16/2020 09/14/2020 07/13/2020  Height 5\' 7"  5\' 7"  5\' 7"   Weight 152 lbs 10 oz 154 lbs 158 lbs  BMI 23.89 61.60 73.71   Systolic 062 694 854  Diastolic 74 64 68  Pulse 84 82 85     Physical Exam  No new physical findings today.     Assessment   1. Primary ovarian failure   2. Malaise and fatigue       Tests ordered Orders Placed This Encounter  Procedures  . DHEA-sulfate  . Estradiol  . Progesterone  . Testos,Total,Free and SHBG (Female)     Plan: 1. She will continue on estradiol 1.5 mg daily, progesterone 200 mg at night, testosterone cream 5 mg applied to the labia daily and I will check levels including DHEA levels. 2. Further recommendations will depend on these results and I will see her in about a month's time for follow-up   No orders of the defined types were placed in this encounter.   Doree Albee, MD

## 2020-11-20 LAB — DHEA-SULFATE: DHEA-SO4: 72 ug/dL (ref 4–157)

## 2020-11-20 LAB — TESTOS,TOTAL,FREE AND SHBG (FEMALE)
Free Testosterone: 37.7 pg/mL — ABNORMAL HIGH (ref 0.2–3.7)
Sex Hormone Binding: 128 nmol/L — ABNORMAL HIGH (ref 14–73)
Testosterone, Total, LC-MS-MS: 496 ng/dL — ABNORMAL HIGH (ref 2–45)

## 2020-11-20 LAB — ESTRADIOL: Estradiol: 76 pg/mL

## 2020-11-20 LAB — PROGESTERONE: Progesterone: 28.3 ng/mL

## 2020-11-25 ENCOUNTER — Ambulatory Visit (INDEPENDENT_AMBULATORY_CARE_PROVIDER_SITE_OTHER): Payer: Medicare Other | Admitting: Podiatry

## 2020-11-25 ENCOUNTER — Encounter: Payer: Self-pay | Admitting: Podiatry

## 2020-11-25 ENCOUNTER — Other Ambulatory Visit: Payer: Self-pay

## 2020-11-25 DIAGNOSIS — B351 Tinea unguium: Secondary | ICD-10-CM

## 2020-11-25 DIAGNOSIS — M79675 Pain in left toe(s): Secondary | ICD-10-CM

## 2020-11-25 DIAGNOSIS — M79674 Pain in right toe(s): Secondary | ICD-10-CM | POA: Diagnosis not present

## 2020-11-25 NOTE — Progress Notes (Signed)
Subjective: Brittany Mahoney is a pleasant 86 y.o. female patient seen today painful mycotic nails b/l that are difficult to trim. Pain interferes with ambulation. Aggravating factors include wearing enclosed shoe gear. Pain is relieved with periodic professional debridement.   Daughter is present during today's visit.  She voices no new pedal concerns on today's visit.   PCP is Dr, Hurshel Party and last visit was 11/16/2020.  Allergies  Allergen Reactions  . Codeine   . Erythromycin   . Morphine And Related     loopy  . Penicillins Hives and Swelling    Has patient had a PCN reaction causing immediate rash, facial/tongue/throat swelling, SOB or lightheadedness with hypotension: Yes Has patient had a PCN reaction causing severe rash involving mucus membranes or skin necrosis: No Has patient had a PCN reaction that required hospitalization No Has patient had a PCN reaction occurring within the last 10 years: No  If all of the above answers are "NO", then may proceed with Cephalosporin use.    Objective: Physical Exam  General: Brittany Mahoney is a pleasant 86 y.o. Caucasian female, in NAD. AAO x 3.   Vascular:  Capillary refill time to digits immediate b/l. Palpable DP pulses b/l. Palpable PT pulses b/l. Pedal hair sparse b/l. Skin temperature gradient within normal limits b/l. No pain with calf compression b/l.  Dermatological:  Pedal skin with normal turgor, texture and tone bilaterally. No open wounds bilaterally. No interdigital macerations bilaterally. Toenails 1-5 b/l elongated, discolored, dystrophic, thickened, crumbly with subungual debris and tenderness to dorsal palpation.  Musculoskeletal:  Normal muscle strength 5/5 to all lower extremity muscle groups bilaterally. No pain crepitus or joint limitation noted with ROM b/l. Hammertoes 2-5 b/l. Pes planovalgus with plantarmedial midfoot collapse b/l.   Neurological:  Protective sensation intact 5/5 intact bilaterally with 10g  monofilament b/l. Vibratory sensation intact b/l. Proprioception intact bilaterally. Clonus negative b/l.  Assessment and Plan:  1. Pain due to onychomycosis of toenails of both feet    -Examined patient. -No new findings. No new orders. -Toenails 1-5 b/l were debrided in length and girth with sterile nail nippers and dremel without iatrogenic bleeding.  -Patient to report any pedal injuries to medical professional immediately. -Continue compounded topical antifungal from [place]. -Patient to continue soft, supportive shoe gear daily. -Patient/POA to call should there be question/concern in the interim.  Return in about 3 months (around 02/25/2021).  Marzetta Board, DPM

## 2020-12-07 ENCOUNTER — Encounter (INDEPENDENT_AMBULATORY_CARE_PROVIDER_SITE_OTHER): Payer: Self-pay | Admitting: Internal Medicine

## 2020-12-14 ENCOUNTER — Ambulatory Visit (INDEPENDENT_AMBULATORY_CARE_PROVIDER_SITE_OTHER): Payer: Medicare Other | Admitting: Internal Medicine

## 2021-01-11 ENCOUNTER — Ambulatory Visit (INDEPENDENT_AMBULATORY_CARE_PROVIDER_SITE_OTHER): Payer: Medicare Other | Admitting: Internal Medicine

## 2021-01-11 ENCOUNTER — Encounter (INDEPENDENT_AMBULATORY_CARE_PROVIDER_SITE_OTHER): Payer: Self-pay | Admitting: Internal Medicine

## 2021-01-11 ENCOUNTER — Other Ambulatory Visit: Payer: Self-pay

## 2021-01-11 VITALS — BP 110/64 | HR 84 | Temp 97.7°F | Ht 67.0 in | Wt 152.2 lb

## 2021-01-11 DIAGNOSIS — R5383 Other fatigue: Secondary | ICD-10-CM | POA: Diagnosis not present

## 2021-01-11 DIAGNOSIS — E2839 Other primary ovarian failure: Secondary | ICD-10-CM | POA: Diagnosis not present

## 2021-01-11 DIAGNOSIS — R5381 Other malaise: Secondary | ICD-10-CM

## 2021-01-11 DIAGNOSIS — M810 Age-related osteoporosis without current pathological fracture: Secondary | ICD-10-CM | POA: Diagnosis not present

## 2021-01-11 DIAGNOSIS — G250 Essential tremor: Secondary | ICD-10-CM | POA: Diagnosis not present

## 2021-01-11 DIAGNOSIS — E559 Vitamin D deficiency, unspecified: Secondary | ICD-10-CM

## 2021-01-11 LAB — DHEA-SULFATE: DHEA-SO4: 249 ug/dL — ABNORMAL HIGH (ref 4–157)

## 2021-01-11 NOTE — Progress Notes (Signed)
Metrics: Intervention Frequency ACO  Documented Smoking Status Yearly  Screened one or more times in 24 months  Cessation Counseling or  Active cessation medication Past 24 months  Past 24 months   Guideline developer: UpToDate (See UpToDate for funding source) Date Released: 2014       Wellness Office Visit  Subjective:  Patient ID: Brittany Mahoney, female    DOB: 1935-05-23  Age: 86 y.o. MRN: [id number]  CC: This lady comes in for follow-up of bioidentical hormone therapy. HPI  She has been consistent with estradiol, progesterone and testosterone.  Her estradiol level was reasonable but certainly could be higher.  She is taking estradiol 1.5 mg daily. Progesterone and testosterone levels are optimal. She started take DHEA 15 mg daily. I get the impression that she really wants to do things and be mobile but her daughter is afraid that she may fall and she works all day long so she tells the patient not to do certain things.  However, the daughter is very supportive and is going to try and get her to move more from now on. Past Medical History:  Diagnosis Date  . Age-related osteoporosis without current pathological fracture   . Arthritis   . Arthropathy, unspecified   . Broken hip ([place])   . Cancer ([place])    uterus  . Chronic kidney disease, stage 1   . Fracture of unspecified part of neck of right femur, sequela   . Glaucoma   . Headache(784.0)    migraines after periods  . Hepatitis   . Osteoporosis   . Other abnormalities of gait and mobility   . Other amnesia   . Pain in left knee   . Pain in right foot   . Pain in right shoulder   . Personal history of (healed) traumatic fracture   . PMB (postmenopausal bleeding)   . Repeated falls   . Tremor, unspecified    Past Surgical History:  Procedure Laterality Date  . ABDOMINAL HYSTERECTOMY  2007   TAH and BSO  . COLONOSCOPY  07/31/2006   Dr.Rourk  . DILATION AND CURETTAGE OF UTERUS    . HIP FRACTURE SURGERY    .  INTRAMEDULLARY (IM) NAIL INTERTROCHANTERIC Right 04/10/2016   Procedure: INTRAMEDULLARY (IM) NAIL RIGHT HIP FRACTURE;  Surgeon: Mcarthur Rossetti, MD;  Location: [facility];  Service: Orthopedics;  Laterality: Right;     Family History  Problem Relation Age of Onset  . Stroke Maternal Grandfather   . Stroke Paternal Grandfather   . Cancer Mother   . Cancer Father        lymphatic sarcoma  . Early death Sister   . Early death Son   . Diabetes Son   . Heart disease Other   . Cancer Other   . Tuberculosis Other   . Diabetes Other   . Thyroid disease Other   . Stroke Other   . Heart disease Maternal Aunt   . 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   Social History   Tobacco Use  . Smoking status: Never Smoker  . Smokeless tobacco: Never Used  Substance Use Topics  . Alcohol use: No    Current Meds  Medication Sig  . Cholecalciferol 1.25 MG (50000 UT) TABS Take 5,000 Int'l Units by mouth daily.  . Cranberry 500 MG CAPS Take  500 mg by mouth daily.  Marland Kitchen estradiol (ESTRACE) 1 MG tablet Take 1.5 tablets (1.5 mg total) by mouth daily.  Marland Kitchen LUMIGAN 0.01 % SOLN INSTILL 1 DROP IN INTO BOTH EYES AT BEDTIME  . naproxen sodium (ALEVE) 220 MG tablet Take 220 mg by mouth daily as needed.  . NONFORMULARY OR COMPOUNDED ITEM Antifungal solution: Terbinafine 3%, Fluconazole 2%, Tea Tree Oil 5%, Urea 10%, Ibuprofen 2% in DMSO suspension #42mL  . Nutritional Supplements (DHEA PO) Take 15 mg by mouth every morning.  . progesterone (PROMETRIUM) 200 MG capsule Take 1 capsule (200 mg total) by mouth daily.  . Testosterone Propionate (FIRST-TESTOSTERONE MC) 2 % CREA Place 5 mg onto the skin daily.     [place] Office Visit from 01/11/2021 in [facility]  PHQ-9 Total Score 0      Objective:   Today's Vitals: BP 110/64    Pulse 84   Temp 97.7 F (36.5 C) (Temporal)   Ht 5\' 7"  (1.702 m)   Wt 152 lb 3.2 oz (69 kg)   SpO2 97%   BMI 23.84 kg/m  Vitals with BMI 01/11/2021 11/16/2020 09/14/2020  Height 5\' 7"  5\' 7"  5\' 7"   Weight 152 lbs 3 oz 152 lbs 10 oz 154 lbs  BMI 23.83 56.21 30.86  Systolic 578 469 629  Diastolic 64 74 64  Pulse 84 84 82     Physical Exam   She looks systemically well.  Blood pressure is excellent.  On neurological examination, she seems to have a benign essential tremor as demonstrated by finger-to-nose test.    Assessment   1. Malaise and fatigue   2. Primary ovarian failure   3. Vitamin D deficiency disease   4. Age-related osteoporosis without current pathological fracture   5. Benign essential tremor       Tests ordered Orders Placed This Encounter  Procedures  . DHEA-sulfate     Plan: 1. Continue with estradiol but try to increase it to 2 mg daily.  The daughter will let me know if she tolerates this.  Continue with same dose of progesterone and testosterone. 2. Continue with DHEA 15 mg daily and we will check levels to date to see if we need to optimize further. 3. Her benign essential tremor is mild and was also diagnosed back in 2019 by neurology.  I think that if it does not get worse, there is nothing to be done in terms of medications. 4. Follow-up with me in about 6 months and she will see Judson Roch in 3 months for an annual Medicare wellness visit.   No orders of the defined types were placed in this encounter.   Doree Albee, MD

## 2021-01-18 DIAGNOSIS — H401111 Primary open-angle glaucoma, right eye, mild stage: Secondary | ICD-10-CM | POA: Diagnosis not present

## 2021-01-18 DIAGNOSIS — H401122 Primary open-angle glaucoma, left eye, moderate stage: Secondary | ICD-10-CM | POA: Diagnosis not present

## 2021-01-28 ENCOUNTER — Other Ambulatory Visit (INDEPENDENT_AMBULATORY_CARE_PROVIDER_SITE_OTHER): Payer: Self-pay | Admitting: Internal Medicine

## 2021-02-01 ENCOUNTER — Other Ambulatory Visit (INDEPENDENT_AMBULATORY_CARE_PROVIDER_SITE_OTHER): Payer: Self-pay | Admitting: Internal Medicine

## 2021-02-01 ENCOUNTER — Encounter (INDEPENDENT_AMBULATORY_CARE_PROVIDER_SITE_OTHER): Payer: Self-pay | Admitting: Internal Medicine

## 2021-02-01 MED ORDER — ESTRADIOL 2 MG PO TABS: 2.0000 mg | ORAL_TABLET | Freq: Every day | ORAL | 1 refills | Status: DC

## 2021-02-22 ENCOUNTER — Encounter: Payer: Self-pay | Admitting: Podiatry

## 2021-03-07 ENCOUNTER — Other Ambulatory Visit: Payer: Self-pay

## 2021-03-07 ENCOUNTER — Encounter: Payer: Self-pay | Admitting: Podiatry

## 2021-03-07 ENCOUNTER — Ambulatory Visit (INDEPENDENT_AMBULATORY_CARE_PROVIDER_SITE_OTHER): Payer: Medicare Other | Admitting: Podiatry

## 2021-03-07 DIAGNOSIS — M79674 Pain in right toe(s): Secondary | ICD-10-CM | POA: Diagnosis not present

## 2021-03-07 DIAGNOSIS — M79675 Pain in left toe(s): Secondary | ICD-10-CM

## 2021-03-07 DIAGNOSIS — B351 Tinea unguium: Secondary | ICD-10-CM | POA: Diagnosis not present

## 2021-03-09 NOTE — Progress Notes (Signed)
  Subjective:  Patient ID: Brittany Mahoney, female    DOB: [birth year],  MRN: [id number]  Brittany Mahoney presents to clinic today for painful thick toenails that are difficult to trim. Pain interferes with ambulation. Aggravating factors include wearing enclosed shoe gear. Pain is relieved with periodic professional debridement.  She voices no new pedal problems on today's visit.  PCP is Doree Albee, MD , and last visit was 01/11/2021.  Allergies  Allergen Reactions   Codeine    Erythromycin    Morphine And Related     loopy   Penicillins Hives and Swelling    Has patient had a PCN reaction causing immediate rash, facial/tongue/throat swelling, SOB or lightheadedness with hypotension: Yes Has patient had a PCN reaction causing severe rash involving mucus membranes or skin necrosis: No Has patient had a PCN reaction that required hospitalization No Has patient had a PCN reaction occurring within the last 10 years: No  If all of the above answers are "NO", then may proceed with Cephalosporin use.     Review of Systems: Negative except as noted in the HPI. Objective:   Constitutional Brittany Mahoney is a pleasant 86 y.o. Caucasian female, WD, WN in NAD. AAO x 3.   Vascular Capillary refill time to digits immediate b/l. Palpable pedal pulses b/l LE. Pedal hair sparse. Lower extremity skin temperature gradient within normal limits. No pain with calf compression b/l. No cyanosis or clubbing noted.  Neurologic Normal speech. Oriented to person, place, and time. Protective sensation intact 5/5 intact bilaterally with 10g monofilament b/l. Vibratory sensation intact b/l.  Dermatologic Pedal skin with normal turgor, texture and tone b/l lower extremities No open wounds b/l lower extremities No interdigital macerations b/l lower extremities Toenails 1-5 b/l elongated, discolored, dystrophic, thickened, crumbly with subungual debris and tenderness to dorsal palpation.  Orthopedic: Normal muscle  strength 5/5 to all lower extremity muscle groups bilaterally. Pes planovalgus with midfoot collapse b/l. No pain crepitus or joint limitation noted with ROM b/l. Hammertoe(s) noted to the 2-5 bilaterally.   Radiographs: None Assessment:   1. Pain due to onychomycosis of toenails of both feet    Plan:  Patient was evaluated and treated and all questions answered.  Onychomycosis with pain -Nails palliatively debridement as below -Educated on self-care  Procedure: Nail Debridement Rationale: Pain Type of Debridement: manual, sharp debridement. Instrumentation: Nail nipper, rotary burr. Number of Nails: 10 -Examined patient. -Patient to continue soft, supportive shoe gear daily. -Toenails 1-5 b/l were debrided in length and girth with sterile nail nippers and dremel without iatrogenic bleeding.  -Patient to report any pedal injuries to medical professional immediately. -Patient/POA to call should there be question/concern in the interim.  Return in about 3 months (around 06/07/2021).  Marzetta Board, DPM

## 2021-03-10 ENCOUNTER — Ambulatory Visit: Payer: Medicare Other | Admitting: Podiatry

## 2021-04-12 ENCOUNTER — Ambulatory Visit (INDEPENDENT_AMBULATORY_CARE_PROVIDER_SITE_OTHER): Payer: Medicare Other | Admitting: Nurse Practitioner

## 2021-04-24 ENCOUNTER — Other Ambulatory Visit (INDEPENDENT_AMBULATORY_CARE_PROVIDER_SITE_OTHER): Payer: Self-pay

## 2021-04-24 NOTE — Telephone Encounter (Signed)
Refills for BHRT pt Of Dr.G:  Per Dr Anastasio Champion notes below:  She has been consistent with estradiol, progesterone and testosterone.  Her estradiol level was reasonable but certainly could be higher.  She is taking estradiol 1.5 mg daily. Progesterone and testosterone levels are optimal. She started take DHEA 15 mg daily. Plan: Continue with estradiol but try to increase it to 2 mg daily.  The daughter will let me know if she tolerates this.  Continue with same dose of progesterone and testosterone. Continue with DHEA 15 mg daily and we will check levels to date to see if we need to optimize further. Her benign essential tremor is mild and was also diagnosed back in 2019 by neurology.  I think that if it does not get worse, there is nothing to be done in terms of medications. Follow-up with me in about 6 months and she will see Judson Roch in 3 months for an annual Medicare wellness visit.

## 2021-04-26 MED ORDER — PROGESTERONE 200 MG PO CAPS: 200.0000 mg | ORAL_CAPSULE | Freq: Every day | ORAL | 2 refills | Status: DC

## 2021-05-12 ENCOUNTER — Ambulatory Visit (INDEPENDENT_AMBULATORY_CARE_PROVIDER_SITE_OTHER): Payer: Medicare Other | Admitting: Podiatry

## 2021-05-12 ENCOUNTER — Encounter: Payer: Self-pay | Admitting: Podiatry

## 2021-05-12 ENCOUNTER — Other Ambulatory Visit: Payer: Self-pay

## 2021-05-12 DIAGNOSIS — M79675 Pain in left toe(s): Secondary | ICD-10-CM

## 2021-05-12 DIAGNOSIS — B351 Tinea unguium: Secondary | ICD-10-CM

## 2021-05-12 DIAGNOSIS — Q828 Other specified congenital malformations of skin: Secondary | ICD-10-CM

## 2021-05-12 DIAGNOSIS — M79674 Pain in right toe(s): Secondary | ICD-10-CM | POA: Diagnosis not present

## 2021-05-12 NOTE — Progress Notes (Signed)
Subjective: Brittany Mahoney is a 86 y.o. female patient seen today for follow up of painful thick toenails that are difficult to trim. Pain interferes with ambulation. Aggravating factors include wearing enclosed shoe gear. Pain is relieved with periodic professional debridement.  Patient states she has painful lesion distal tip of right 2nd toe.   PCP is Doree Albee, MD (Inactive). Last visit was: 11/16/2020.  New problems reported today: None.  Allergies  Allergen Reactions   Codeine    Erythromycin    Morphine And Related     loopy   Penicillins Hives and Swelling    Has patient had a PCN reaction causing immediate rash, facial/tongue/throat swelling, SOB or lightheadedness with hypotension: Yes Has patient had a PCN reaction causing severe rash involving mucus membranes or skin necrosis: No Has patient had a PCN reaction that required hospitalization No Has patient had a PCN reaction occurring within the last 10 years: No  If all of the above answers are "NO", then may proceed with Cephalosporin use.     She has to look for new PCP as Dr. Anastasio Champion passed away recently.   Objective: Physical Exam  General: Patient is a pleasant 86 y.o. Caucasian female WD, WN in NAD. AAO x 3.   Neurovascular Examination: Capillary refill time to digits immediate b/l. Palpable pedal pulses b/l LE. Pedal hair sparse b/l. Lower extremity skin temperature gradient within normal limits. No edema noted b/l lower extremities.   Protective sensation intact 5/5 intact bilaterally with 10g monofilament b/l. Vibratory sensation intact b/l.  Dermatological:  Skin warm and supple b/l lower extremities. No open wounds b/l lower extremities. No interdigital macerations b/l lower extremities. Toenails 1-5 b/l elongated, discolored, dystrophic, thickened, crumbly with subungual debris and tenderness to dorsal palpation.  Porokeratotic lesion distal tip right 2nd digit with tenderness to palpation. No  erythema, no edema, no drainage, no fluctuance.  Musculoskeletal:  Normal muscle strength 5/5 to all lower extremity muscle groups bilaterally. No pain crepitus or joint limitation noted with ROM b/l lower extremities.  Gross deformities: Hammertoe(s) noted to the 2-5 bilaterally. Pes planovalgus deformity noted b/l lower extremities.  Assessment: 1. Pain due to onychomycosis of toenails of both feet   2. Porokeratosis   3. Pain in right toe(s)    Plan: -Examined patient. -Patient to continue soft, supportive shoe gear daily. -Toenails 1-5 b/l were debrided in length and girth with sterile nail nippers and dremel without iatrogenic bleeding.  -Painful porokeratotic lesion(s) R 2nd toe pared and enucleated with sterile scalpel blade without incident. Total number of lesions debrided=1. -Patient to report any pedal injuries to medical professional immediately. -Patient/POA to call should there be question/concern in the interim.  Return in about 9 weeks (around 07/14/2021).  Marzetta Board, DPM

## 2021-05-16 ENCOUNTER — Encounter: Payer: Self-pay | Admitting: Podiatry

## 2021-06-08 DIAGNOSIS — Z20822 Contact with and (suspected) exposure to covid-19: Secondary | ICD-10-CM | POA: Diagnosis not present

## 2021-06-23 ENCOUNTER — Other Ambulatory Visit: Payer: Self-pay

## 2021-06-23 ENCOUNTER — Encounter: Payer: Self-pay | Admitting: Internal Medicine

## 2021-06-23 ENCOUNTER — Ambulatory Visit (INDEPENDENT_AMBULATORY_CARE_PROVIDER_SITE_OTHER): Payer: Medicare Other | Admitting: Internal Medicine

## 2021-06-23 VITALS — BP 138/82 | HR 102 | Resp 18 | Ht 66.5 in | Wt 148.1 lb

## 2021-06-23 DIAGNOSIS — M170 Bilateral primary osteoarthritis of knee: Secondary | ICD-10-CM

## 2021-06-23 DIAGNOSIS — Z8542 Personal history of malignant neoplasm of other parts of uterus: Secondary | ICD-10-CM | POA: Diagnosis not present

## 2021-06-23 DIAGNOSIS — Z23 Encounter for immunization: Secondary | ICD-10-CM

## 2021-06-23 DIAGNOSIS — R296 Repeated falls: Secondary | ICD-10-CM | POA: Diagnosis not present

## 2021-06-23 DIAGNOSIS — F02B18 Dementia in other diseases classified elsewhere, moderate, with other behavioral disturbance: Secondary | ICD-10-CM | POA: Diagnosis not present

## 2021-06-23 DIAGNOSIS — M810 Age-related osteoporosis without current pathological fracture: Secondary | ICD-10-CM | POA: Diagnosis not present

## 2021-06-23 DIAGNOSIS — N904 Leukoplakia of vulva: Secondary | ICD-10-CM | POA: Diagnosis not present

## 2021-06-23 DIAGNOSIS — M179 Osteoarthritis of knee, unspecified: Secondary | ICD-10-CM | POA: Insufficient documentation

## 2021-06-23 DIAGNOSIS — Z7689 Persons encountering health services in other specified circumstances: Secondary | ICD-10-CM

## 2021-06-23 DIAGNOSIS — G309 Alzheimer's disease, unspecified: Secondary | ICD-10-CM | POA: Diagnosis not present

## 2021-06-23 MED ORDER — ESTRADIOL 2 MG PO TABS: 2.0000 mg | ORAL_TABLET | Freq: Every day | ORAL | 1 refills | Status: DC

## 2021-06-23 MED ORDER — PROGESTERONE 200 MG PO CAPS: 200.0000 mg | ORAL_CAPSULE | Freq: Every day | ORAL | 1 refills | Status: DC

## 2021-06-23 NOTE — Assessment & Plan Note (Signed)
Care established Previous chart reviewed History and medications reviewed with the patient 

## 2021-06-23 NOTE — Progress Notes (Signed)
New Patient Office Visit  Subjective:  Patient ID: Brittany Mahoney, female    DOB: 03/12/1935  Age: 86 y.o. MRN: [id number]  CC:  Chief Complaint  Patient presents with   New Patient (Initial Visit)    New patient was seeing dr Anastasio Champion     HPI Brittany Mahoney is a 86 y.o. female with PMH of osteoporosis, OA of knee, endometrial cancer s/p TAH, dementia and recurrent falls who presents for establishing care.  Her daughter is present during the visit today.  She has a history of osteoporosis, for which she takes vitamin D supplement and HRT with estradiol and progesterone. Of note, she was on Fosamax in the past for 3-4 years.  She has had recurrent falls and had a femur fracture, s/p repair.  She has chronic gait instability, she uses a walker for walking support.  Her daughter reports that she has short-term memory issues at times.  But she is independent with ADLs, including eating, bathing and clothing.  She has had 2 doses of COVID-vaccine.  She received flu vaccine in the office today.    Past Medical History:  Diagnosis Date   Age-related osteoporosis without current pathological fracture    Arthritis    Arthropathy, unspecified    Broken hip ([place])    Cancer ([place])    uterus   Chronic kidney disease, stage 1    Closed right hip fracture ([place]) 04/09/2016   Endometrial cancer, grade I ([place]) 03/21/2015   Fracture of unspecified part of neck of right femur, sequela    Glaucoma    Headache(784.0)    migraines after periods   Hepatitis    Hx of cancer of endometrium 03/06/2013   Osteoporosis    Other abnormalities of gait and mobility    Other amnesia    Pain in left knee    Pain in right foot    Pain in right shoulder    Personal history of (healed) traumatic fracture    PMB (postmenopausal bleeding)    Repeated falls    Tremor, unspecified     Past Surgical History:  Procedure Laterality Date   ABDOMINAL HYSTERECTOMY  2007   TAH and BSO   COLONOSCOPY  07/31/2006    Dr.Rourk   DILATION AND CURETTAGE OF UTERUS     HIP FRACTURE SURGERY     INTRAMEDULLARY (IM) NAIL INTERTROCHANTERIC Right 04/10/2016   Procedure: INTRAMEDULLARY (IM) NAIL RIGHT HIP FRACTURE;  Surgeon: Mcarthur Rossetti, MD;  Location: [facility];  Service: Orthopedics;  Laterality: Right;    Family History  Problem Relation Age of Onset   Stroke Maternal Grandfather    Stroke Paternal Grandfather    Cancer Mother    Cancer Father        lymphatic sarcoma   Early death Sister    Early death Son    Diabetes Son    Heart disease Other    Cancer Other    Tuberculosis Other    Diabetes Other    Thyroid disease Other    Stroke Other    Heart disease Maternal Aunt    Heart disease Maternal Uncle     Social History   Socioeconomic History   Marital status: Widowed    Spouse name: Not on file   Number of children: Not on file   Years of education: Not on file   Highest education level: Not on file  Occupational History   Not on file  Tobacco Use   Smoking  status: Never   Smokeless tobacco: Never  Vaping Use   Vaping Use: Never used  Substance and Sexual Activity   Alcohol use: No   Drug use: No   Sexual activity: Not Currently    Birth control/protection: Surgical  Other Topics Concern   Not on file  Social History Narrative   Widow since 2016,married for 55 years.Lives with daughter.Occupational hygienist for [employer] ,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   Social Determinants of Health   Financial Resource Strain: Not on file  Food Insecurity: Not on file  Transportation Needs: Not on file  Physical Activity: Not on file  Stress: Not on file  Social Connections: Not on file  Intimate Partner Violence: Not on file    ROS Review of Systems  Constitutional:  Negative for chills and fever.  HENT:  Negative for congestion, sinus pressure, sinus pain and sore throat.   Eyes:  Negative for pain and discharge.  Respiratory:  Negative for cough and shortness of breath.    Cardiovascular:  Negative for chest pain and palpitations.  Gastrointestinal:  Negative for abdominal pain, diarrhea, nausea and vomiting.  Endocrine: Negative for polydipsia and polyuria.  Genitourinary:  Negative for dysuria and hematuria.  Musculoskeletal:  Positive for arthralgias, back pain and gait problem. Negative for neck pain and neck stiffness.  Skin:  Negative for rash.  Neurological:  Positive for tremors. Negative for dizziness and weakness.  Psychiatric/Behavioral:  Negative for agitation and behavioral problems.    Objective:   Today's Vitals: BP 138/82 (BP Location: Left Arm, Patient Position: Sitting, Cuff Size: Normal)   Pulse (!) 102   Resp 18   Ht 5' 6.5" (1.689 m)   Wt 148 lb 1.3 oz (67.2 kg)   SpO2 97%   BMI 23.54 kg/m   Physical Exam Vitals reviewed.  Constitutional:      General: She is not in acute distress.    Appearance: She is not diaphoretic.  HENT:     Head: Normocephalic and atraumatic.     Nose: Nose normal.     Mouth/Throat:     Mouth: Mucous membranes are moist.  Eyes:     General: No scleral icterus.    Extraocular Movements: Extraocular movements intact.  Cardiovascular:     Rate and Rhythm: Normal rate and regular rhythm.     Pulses: Normal pulses.     Heart sounds: Normal heart sounds. No murmur heard. Pulmonary:     Breath sounds: Normal breath sounds. No wheezing or rales.  Musculoskeletal:     Cervical back: Neck supple. No tenderness.     Right lower leg: No edema.     Left lower leg: No edema.  Skin:    General: Skin is warm.     Findings: No rash.  Neurological:     General: No focal deficit present.     Mental Status: She is alert and oriented to person, place, and time. Mental status is at baseline.  Psychiatric:        Mood and Affect: Mood normal.        Behavior: Behavior normal.    Assessment & Plan:   Problem List Items Addressed This Visit       Encounter to establish care - Primary   Care  established Previous chart reviewed History and medications reviewed with the patient       Nervous and Auditory   Dementia ([place])    Mostly short-term memory issues with mild agitation at times Independent with  ADLs Prefers to not take any medication for now Lives with daughter, who helps with IADLs.        Musculoskeletal and Integument   Osteoporosis    Last DEXA scan reviewed Was on Fosamax in the past Currently on vitamin D supplements and HRT with estradiol and progesterone Discussed about side effects of HRT in her case, would avoid continuing HRT for a long time -refilled for now  DC testosterone      OA (osteoarthritis) of knee    Chronic knee pain Advised to take Tylenol arthritis instead of Aleve Voltaren gel as needed        Genitourinary   Vulvar dystrophy    On HRT      Relevant Medications   estradiol (ESTRACE) 2 MG tablet   progesterone (PROMETRIUM) 200 MG capsule     Other   History of uterine cancer    s/p TAH      Repeated falls    Has had multiple falls due to gait problem Uses walker at home Advised for proper hydration and to avoid skipping any meal On avoid sudden positional changes            Other Visit Diagnoses     Need for immunization against influenza       Relevant Orders   Flu Vaccine QUAD High Dose(Fluad) (Completed)       Outpatient Encounter Medications as of 06/23/2021  Medication Sig   Cholecalciferol 1.25 MG (50000 UT) TABS Take 5,000 Int'l Units by mouth daily.   Cranberry 500 MG CAPS Take 500 mg by mouth daily.   LUMIGAN 0.01 % SOLN INSTILL 1 DROP IN INTO BOTH EYES AT BEDTIME   naproxen sodium (ALEVE) 220 MG tablet Take 220 mg by mouth daily as needed.   NONFORMULARY OR COMPOUNDED ITEM Antifungal solution: Terbinafine 3%, Fluconazole 2%, Tea Tree Oil 5%, Urea 10%, Ibuprofen 2% in DMSO suspension #53mL   Nutritional Supplements (DHEA PO) Take 15 mg by mouth every morning.   [DISCONTINUED] estradiol  (ESTRACE) 2 MG tablet Take 1 tablet (2 mg total) by mouth daily.   [DISCONTINUED] Testosterone Propionate (FIRST-TESTOSTERONE MC) 2 % CREA Place 5 mg onto the skin daily.   estradiol (ESTRACE) 2 MG tablet Take 1 tablet (2 mg total) by mouth daily.   progesterone (PROMETRIUM) 200 MG capsule Take 1 capsule (200 mg total) by mouth daily.   [DISCONTINUED] progesterone (PROMETRIUM) 200 MG capsule Take 1 capsule (200 mg total) by mouth daily.   No facility-administered encounter medications on file as of 06/23/2021.    Follow-up: Return in about 4 months (around 10/24/2021) for Osteoporosis.   Lindell Spar, MD

## 2021-06-23 NOTE — Assessment & Plan Note (Addendum)
Mostly short-term memory issues with mild agitation at times Independent with ADLs Prefers to not take any medication for now Lives with daughter, who helps with IADLs. 

## 2021-06-23 NOTE — Assessment & Plan Note (Addendum)
Last DEXA scan reviewed Was on Fosamax in the past Currently on vitamin D supplements and HRT with estradiol and progesterone Discussed about side effects of HRT in her case, would avoid continuing HRT for a long time -refilled for now  DC testosterone

## 2021-06-23 NOTE — Assessment & Plan Note (Signed)
s/p TAH

## 2021-06-23 NOTE — Assessment & Plan Note (Signed)
Chronic knee pain Advised to take Tylenol arthritis instead of Aleve Voltaren gel as needed

## 2021-06-23 NOTE — Patient Instructions (Signed)
Please continue taking medications as prescribed.  Please use Voltaren gel for knee pain.  Please start taking Caltrate 600 plus D3 twice daily instead of Vitamin D alone.

## 2021-06-23 NOTE — Assessment & Plan Note (Signed)
On HRT

## 2021-06-23 NOTE — Assessment & Plan Note (Signed)
Has had multiple falls due to gait problem Uses walker at home Advised for proper hydration and to avoid skipping any meal On avoid sudden positional changes

## 2021-06-26 ENCOUNTER — Other Ambulatory Visit: Payer: Self-pay

## 2021-06-26 ENCOUNTER — Ambulatory Visit (INDEPENDENT_AMBULATORY_CARE_PROVIDER_SITE_OTHER): Payer: Medicare Other | Admitting: *Deleted

## 2021-06-26 DIAGNOSIS — Z0000 Encounter for general adult medical examination without abnormal findings: Secondary | ICD-10-CM | POA: Diagnosis not present

## 2021-06-26 NOTE — Progress Notes (Signed)
Subjective:   Brittany Mahoney is a 86 y.o. female who presents for Medicare Annual (Subsequent) preventive examination.  I connected with  Alonna Minium on 06/26/21 by an audio enabled telemedicine application and verified that I am speaking with the correct person using two identifiers.   I discussed the limitations, risks, security and privacy concerns of performing an evaluation and management service by telephone and the availability of in person appointments. I also discussed with the patient that there may be a patient responsible charge related to this service. The patient expressed understanding and verbally consented to this telephonic visit.   Review of Systems           Objective:    There were no vitals filed for this visit. There is no height or weight on file to calculate BMI.  Advanced Directives 03/30/2020 12/10/2019 10/02/2018 09/16/2018 05/16/2017 04/25/2017 04/18/2017  Does Patient Have a Medical Advance Directive? Yes Yes Yes Yes Yes Yes Yes  Type of Paramedic of [place];Living will [place];Living will [place];Living will [place];Living will [place];Living will [place];Living will  Does patient want to make changes to medical advance directive? No - Patient declined No - Patient declined - - - - -  Copy of [place] in Chart? No - copy requested - - - No - copy requested No - copy requested No - copy requested  Would patient like information on creating a medical advance directive? - - - - - - -    Current Medications (verified) Outpatient Encounter Medications as of 06/26/2021  Medication Sig   Cholecalciferol 1.25 MG (50000 UT) TABS Take 5,000 Int'l Units by mouth daily.   Cranberry 500 MG CAPS Take 500 mg by mouth daily.   estradiol (ESTRACE) 2 MG tablet Take 1 tablet (2 mg total) by mouth daily.    LUMIGAN 0.01 % SOLN INSTILL 1 DROP IN INTO BOTH EYES AT BEDTIME   naproxen sodium (ALEVE) 220 MG tablet Take 220 mg by mouth daily as needed.   NONFORMULARY OR COMPOUNDED ITEM Antifungal solution: Terbinafine 3%, Fluconazole 2%, Tea Tree Oil 5%, Urea 10%, Ibuprofen 2% in DMSO suspension #27mL   Nutritional Supplements (DHEA PO) Take 15 mg by mouth every morning.   progesterone (PROMETRIUM) 200 MG capsule Take 1 capsule (200 mg total) by mouth daily.   No facility-administered encounter medications on file as of 06/26/2021.    Allergies (verified) Codeine, Erythromycin, Morphine and related, and Penicillins   History: Past Medical History:  Diagnosis Date   Age-related osteoporosis without current pathological fracture    Arthritis    Arthropathy, unspecified    Broken hip ([place])    Cancer ([place])    uterus   Chronic kidney disease, stage 1    Closed right hip fracture ([place]) 04/09/2016   Endometrial cancer, grade I ([place]) 03/21/2015   Fracture of unspecified part of neck of right femur, sequela    Glaucoma    Headache(784.0)    migraines after periods   Hepatitis    Hx of cancer of endometrium 03/06/2013   Osteoporosis    Other abnormalities of gait and mobility    Other amnesia    Pain in left knee    Pain in right foot    Pain in right shoulder    Personal history of (healed) traumatic fracture    PMB (postmenopausal bleeding)  Repeated falls    Tremor, unspecified    Past Surgical History:  Procedure Laterality Date   ABDOMINAL HYSTERECTOMY  2007   TAH and BSO   COLONOSCOPY  07/31/2006   Dr.Rourk   DILATION AND CURETTAGE OF UTERUS     HIP FRACTURE SURGERY     INTRAMEDULLARY (IM) NAIL INTERTROCHANTERIC Right 04/10/2016   Procedure: INTRAMEDULLARY (IM) NAIL RIGHT HIP FRACTURE;  Surgeon: Mcarthur Rossetti, MD;  Location: [facility];  Service: Orthopedics;  Laterality: Right;   Family History  Problem Relation Age of Onset   Stroke Maternal Grandfather    Stroke  Paternal Grandfather    Cancer Mother    Cancer Father        lymphatic sarcoma   Early death Sister    Early death Son    Diabetes Son    Heart disease Other    Cancer Other    Tuberculosis Other    Diabetes Other    Thyroid disease Other    Stroke Other    Heart disease Maternal Aunt    Heart disease Maternal Uncle    Social History   Socioeconomic History   Marital status: Widowed    Spouse name: Not on file   Number of children: Not on file   Years of education: Not on file   Highest education level: Not on file  Occupational History   Not on file  Tobacco Use   Smoking status: Never   Smokeless tobacco: Never  Vaping Use   Vaping Use: Never used  Substance and Sexual Activity   Alcohol use: No   Drug use: No   Sexual activity: Not Currently    Birth control/protection: Surgical  Other Topics Concern   Not on file  Social History Narrative   Widow since 2016,married for 55 years.Lives with daughter.Occupational hygienist for [employer] ,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   Social Determinants of Health   Financial Resource Strain: Not on file  Food Insecurity: Not on file  Transportation Needs: Not on file  Physical Activity: Not on file  Stress: Not on file  Social Connections: Not on file    Tobacco Counseling Counseling given: Not Answered   Clinical Intake:                 Diabetic?no         Activities of Daily Living In your present state of health, do you have any difficulty performing the following activities: 06/23/2021  Hearing? N  Vision? N  Difficulty concentrating or making decisions? N  Walking or climbing stairs? Y  Dressing or bathing? Y  Doing errands, shopping? Y  Some recent data might be hidden    Patient Care Team: Lindell Spar, MD as PCP - General (Internal Medicine)  Indicate any recent Medical Services you may have received from other than [facility] providers in the past year (date may be approximate).      Assessment:   This is a routine wellness examination for [facility].  Hearing/Vision screen No results found.  Dietary issues and exercise activities discussed:     Goals Addressed   None   Depression Screen PHQ 2/9 Scores 06/23/2021 01/11/2021 03/30/2020 10/26/2019 04/01/2017  PHQ - 2 Score 0 0 0 0 1  PHQ- 9 Score - 0 - - -    Fall Risk Fall Risk  06/23/2021 01/11/2021 03/30/2020 10/26/2019 04/10/2019  Falls in the past year? 1 1 1 1 1   Comment - - - - [facility] Telephone Survey:  data to providers prior to load  Number falls in past yr: 1 1 0 1 1  Comment - - - - [facility]i Telephone Survey Actual Response = 3  Injury with Fall? 1 1 1  0 0  Risk for fall due to : History of fall(s);Impaired balance/gait;Impaired mobility - Impaired mobility - -  Follow up Falls evaluation completed;Education provided;Falls prevention discussed - Falls evaluation completed - -    FALL RISK PREVENTION PERTAINING TO THE HOME:  Any stairs in or around the home? No  If so, are there any without handrails? No  Home free of loose throw rugs in walkways, pet beds, electrical cords, etc? Yes  Adequate lighting in your home to reduce risk of falls? Yes   ASSISTIVE DEVICES UTILIZED TO PREVENT FALLS:  Life alert? Yes  Use of a cane, walker or w/c? Yes  Grab bars in the bathroom? No  Shower chair or bench in shower? Yes  Elevated toilet seat or a handicapped toilet? No   TIMED UP AND GO:  Was the test performed? No .  Length of time to ambulate 10 feet: NA sec.   Cognitive Function: MMSE - Mini Mental State Exam 10/26/2019  Orientation to time 5  Orientation to Place 5  Registration 3  Attention/ Calculation 3  Recall 2  Language- name 2 objects 2  Language- repeat 1  Language- follow 3 step command 2  Language- read & follow direction 1  Write a sentence 1  Copy design 1  Total score 26     6CIT Screen 03/30/2020  What Year? 0 points  What month? 0 points  What time? 0 points  Count back from 20 0 points   Months in reverse 0 points  Repeat phrase 0 points  Total Score 0    Immunizations Immunization History  Administered Date(s) Administered   Fluad Quad(high Dose 65+) 07/11/2019, 07/13/2020, 06/23/2021   Influenza, High Dose Seasonal PF 07/02/2017, 07/19/2018   PFIZER(Purple Top)SARS-COV-2 Vaccination 12/12/2019, 01/06/2020   Pneumococcal Conjugate-13 09/14/2020   Pneumococcal Polysaccharide-23 06/14/2014    TDAP status: Due, Education has been provided regarding the importance of this vaccine. Advised may receive this vaccine at local pharmacy or Health Dept. Aware to provide a copy of the vaccination record if obtained from local pharmacy or Health Dept. Verbalized acceptance and understanding.  Flu Vaccine status: Up to date  Pneumococcal vaccine status: Up to date  Covid-19 vaccine status: Completed vaccines  Qualifies for Shingles Vaccine? Yes   Zostavax completed Yes   Shingrix Completed?: Yes  Screening Tests Health Maintenance  Topic Date Due   TETANUS/TDAP  Never done   Zoster Vaccines- Shingrix (1 of 2) Never done   COVID-19 Vaccine (3 - Booster for Pfizer series) 06/07/2020   INFLUENZA VACCINE  Completed   DEXA SCAN  Completed   HPV VACCINES  Aged Out    Health Maintenance  Health Maintenance Due  Topic Date Due   TETANUS/TDAP  Never done   Zoster Vaccines- Shingrix (1 of 2) Never done   COVID-19 Vaccine (3 - Booster for Pfizer series) 06/07/2020    Colorectal cancer screening: No longer required.   Mammogram: Up to date  Bone Density status: Completed 06-16-20. Results reflect: Bone density results: NORMAL. Repeat every 5 years.  Lung Cancer Screening: (Low Dose CT Chest recommended if Age 47-80 years, 30 pack-year currently smoking OR have quit w/in 15years.) does not qualify.   Lung Cancer Screening Referral: NA  Additional Screening:  Hepatitis C  Screening: does qualify; Completed   Vision Screening: Recommended annual ophthalmology exams  for early detection of glaucoma and other disorders of the eye. Is the patient up to date with their annual eye exam?  Yes  Who is the provider or what is the name of the office in which the patient attends annual eye exams? [facility] Dr Linna Hoff If pt is not established with a provider, would they like to be referred to a provider to establish care? No .   Dental Screening: Recommended annual dental exams for proper oral hygiene  Community Resource Referral / Chronic Care Management: CRR required this visit?  No   CCM required this visit?  No      Plan:     I have personally reviewed and noted the following in the patient's chart:   Medical and social history Use of alcohol, tobacco or illicit drugs  Current medications and supplements including opioid prescriptions.  Functional ability and status Nutritional status Physical activity Advanced directives List of other physicians Hospitalizations, surgeries, and ER visits in previous 12 months Vitals Screenings to include cognitive, depression, and falls Referrals and appointments  In addition, I have reviewed and discussed with patient certain preventive protocols, quality metrics, and best practice recommendations. A written personalized care plan for preventive services as well as general preventive health recommendations were provided to patient.     Shelda Altes, CMA   06/26/2021   Nurse Notes: This was a telehealth visit. The patient was at home. The provider was in the office and was Ihor Dow, MD.

## 2021-06-26 NOTE — Patient Instructions (Signed)
Brittany Mahoney , Thank you for taking time to come for your Medicare Wellness Visit. I appreciate your ongoing commitment to your health goals. Please review the following plan we discussed and let me know if I can assist you in the future.   Screening recommendations/referrals:  Recommended yearly ophthalmology/optometry visit for glaucoma screening and checkup Recommended yearly dental visit for hygiene and checkup  Vaccinations: Influenza vaccine: Completed  Pneumococcal vaccine: Completed  Tdap vaccine: Due now  Shingles vaccine:  Completed     Advanced directives: copy requested   Next appointment: 1 year  Preventive Care 99 Years and Older, Female Preventive care refers to lifestyle choices and visits with your health care provider that can promote health and wellness. What does preventive care include? A yearly physical exam. This is also called an annual well check. Dental exams once or twice a year. Routine eye exams. Ask your health care provider how often you should have your eyes checked. Personal lifestyle choices, including: Daily care of your teeth and gums. Regular physical activity. Eating a healthy diet. Avoiding tobacco and drug use. Limiting alcohol use. Practicing safe sex. Taking low doses of aspirin every day. Taking vitamin and mineral supplements as recommended by your health care provider. What happens during an annual well check? The services and screenings done by your health care provider during your annual well check will depend on your age, overall health, lifestyle risk factors, and family history of disease. Counseling  Your health care provider may ask you questions about your: Alcohol use. Tobacco use. Drug use. Emotional well-being. Home and relationship well-being. Sexual activity. Eating habits. History of falls. Memory and ability to understand (cognition). Work and work Statistician. Screening  You may have the following tests or  measurements: Height, weight, and BMI. Blood pressure. Lipid and cholesterol levels. These may be checked every 5 years, or more frequently if you are over 81 years old. Skin check. Lung cancer screening. You may have this screening every year starting at age 36 if you have a 30-pack-year history of smoking and currently smoke or have quit within the past 15 years. Fecal occult blood test (FOBT) of the stool. You may have this test every year starting at age 60. Flexible sigmoidoscopy or colonoscopy. You may have a sigmoidoscopy every 5 years or a colonoscopy every 10 years starting at age 1. Prostate cancer screening. Recommendations will vary depending on your family history and other risks. Hepatitis C blood test. Hepatitis B blood test. Sexually transmitted disease (STD) testing. Diabetes screening. This is done by checking your blood sugar (glucose) after you have not eaten for a while (fasting). You may have this done every 1-3 years. Abdominal aortic aneurysm (AAA) screening. You may need this if you are a current or former smoker. Osteoporosis. You may be screened starting at age 80 if you are at high risk. Talk with your health care provider about your test results, treatment options, and if necessary, the need for more tests. Vaccines  Your health care provider may recommend certain vaccines, such as: Influenza vaccine. This is recommended every year. Tetanus, diphtheria, and acellular pertussis (Tdap, Td) vaccine. You may need a Td booster every 10 years. Zoster vaccine. You may need this after age 1. Pneumococcal 13-valent conjugate (PCV13) vaccine. One dose is recommended after age 77. Pneumococcal polysaccharide (PPSV23) vaccine. One dose is recommended after age 34. Talk to your health care provider about which screenings and vaccines you need and how often you need them. This  information is not intended to replace advice given to you by your health care provider. Make sure  you discuss any questions you have with your health care provider. Document Released: 09/23/2015 Document Revised: 05/16/2016 Document Reviewed: 06/28/2015 Elsevier Interactive Patient Education  2017 [employer] Prevention in the Home Falls can cause injuries. They can happen to people of all ages. There are many things you can do to make your home safe and to help prevent falls. What can I do on the outside of my home? Regularly fix the edges of walkways and driveways and fix any cracks. Remove anything that might make you trip as you walk through a door, such as a raised step or threshold. Trim any bushes or trees on the path to your home. Use bright outdoor lighting. Clear any walking paths of anything that might make someone trip, such as rocks or tools. Regularly check to see if handrails are loose or broken. Make sure that both sides of any steps have handrails. Any raised decks and porches should have guardrails on the edges. Have any leaves, snow, or ice cleared regularly. Use sand or salt on walking paths during winter. Clean up any spills in your garage right away. This includes oil or grease spills. What can I do in the bathroom? Use night lights. Install grab bars by the toilet and in the tub and shower. Do not use towel bars as grab bars. Use non-skid mats or decals in the tub or shower. If you need to sit down in the shower, use a plastic, non-slip stool. Keep the floor dry. Clean up any water that spills on the floor as soon as it happens. Remove soap buildup in the tub or shower regularly. Attach bath mats securely with double-sided non-slip rug tape. Do not have throw rugs and other things on the floor that can make you trip. What can I do in the bedroom? Use night lights. Make sure that you have a light by your bed that is easy to reach. Do not use any sheets or blankets that are too big for your bed. They should not hang down onto the floor. Have a firm  chair that has side arms. You can use this for support while you get dressed. Do not have throw rugs and other things on the floor that can make you trip. What can I do in the kitchen? Clean up any spills right away. Avoid walking on wet floors. Keep items that you use a lot in easy-to-reach places. If you need to reach something above you, use a strong step stool that has a grab bar. Keep electrical cords out of the way. Do not use floor polish or wax that makes floors slippery. If you must use wax, use non-skid floor wax. Do not have throw rugs and other things on the floor that can make you trip. What can I do with my stairs? Do not leave any items on the stairs. Make sure that there are handrails on both sides of the stairs and use them. Fix handrails that are broken or loose. Make sure that handrails are as long as the stairways. Check any carpeting to make sure that it is firmly attached to the stairs. Fix any carpet that is loose or worn. Avoid having throw rugs at the top or bottom of the stairs. If you do have throw rugs, attach them to the floor with carpet tape. Make sure that you have a light switch at the top  of the stairs and the bottom of the stairs. If you do not have them, ask someone to add them for you. What else can I do to help prevent falls? Wear shoes that: Do not have high heels. Have rubber bottoms. Are comfortable and fit you well. Are closed at the toe. Do not wear sandals. If you use a stepladder: Make sure that it is fully opened. Do not climb a closed stepladder. Make sure that both sides of the stepladder are locked into place. Ask someone to hold it for you, if possible. Clearly mark and make sure that you can see: Any grab bars or handrails. First and last steps. Where the edge of each step is. Use tools that help you move around (mobility aids) if they are needed. These include: Canes. Walkers. Scooters. Crutches. Turn on the lights when you go  into a dark area. Replace any light bulbs as soon as they burn out. Set up your furniture so you have a clear path. Avoid moving your furniture around. If any of your floors are uneven, fix them. If there are any pets around you, be aware of where they are. Review your medicines with your doctor. Some medicines can make you feel dizzy. This can increase your chance of falling. Ask your doctor what other things that you can do to help prevent falls. This information is not intended to replace advice given to you by your health care provider. Make sure you discuss any questions you have with your health care provider. Document Released: 06/23/2009 Document Revised: 02/02/2016 Document Reviewed: 10/01/2014 Elsevier Interactive Patient Education  2017 [employer].

## 2021-06-29 DIAGNOSIS — Z20822 Contact with and (suspected) exposure to covid-19: Secondary | ICD-10-CM | POA: Diagnosis not present

## 2021-07-13 DIAGNOSIS — Z1379 Encounter for other screening for genetic and chromosomal anomalies: Secondary | ICD-10-CM | POA: Diagnosis not present

## 2021-07-13 DIAGNOSIS — Z808 Family history of malignant neoplasm of other organs or systems: Secondary | ICD-10-CM | POA: Diagnosis not present

## 2021-07-13 DIAGNOSIS — Z8542 Personal history of malignant neoplasm of other parts of uterus: Secondary | ICD-10-CM | POA: Diagnosis not present

## 2021-07-14 ENCOUNTER — Ambulatory Visit (INDEPENDENT_AMBULATORY_CARE_PROVIDER_SITE_OTHER): Payer: Medicare Other | Admitting: Podiatry

## 2021-07-14 ENCOUNTER — Encounter: Payer: Self-pay | Admitting: Podiatry

## 2021-07-14 ENCOUNTER — Other Ambulatory Visit: Payer: Self-pay

## 2021-07-14 DIAGNOSIS — M79675 Pain in left toe(s): Secondary | ICD-10-CM

## 2021-07-14 DIAGNOSIS — Q828 Other specified congenital malformations of skin: Secondary | ICD-10-CM | POA: Diagnosis not present

## 2021-07-14 DIAGNOSIS — Z20822 Contact with and (suspected) exposure to covid-19: Secondary | ICD-10-CM | POA: Diagnosis not present

## 2021-07-14 DIAGNOSIS — M79674 Pain in right toe(s): Secondary | ICD-10-CM

## 2021-07-14 DIAGNOSIS — B351 Tinea unguium: Secondary | ICD-10-CM

## 2021-07-17 ENCOUNTER — Telehealth: Payer: Self-pay | Admitting: Internal Medicine

## 2021-07-17 DIAGNOSIS — Z20822 Contact with and (suspected) exposure to covid-19: Secondary | ICD-10-CM | POA: Diagnosis not present

## 2021-07-17 NOTE — Telephone Encounter (Signed)
Josh with Living well screening call on pt behalf about requisition form that was faxed in on 11/2   Call back [place] : living well screening  [phone] Ext. [phone]

## 2021-07-17 NOTE — Progress Notes (Signed)
  Subjective:  Patient ID: Brittany Mahoney, female    DOB: 03/04/1935,  MRN: [id number]  Brittany Mahoney presents to clinic today for painful porokeratotic lesion(s) right 2nd toe and painful mycotic toenails that limit ambulation. Painful toenails interfere with ambulation. Aggravating factors include wearing enclosed shoe gear. Pain is relieved with periodic professional debridement. Painful porokeratotic lesions are aggravated when weightbearing with and without shoegear. Pain is relieved with periodic professional debridement.  Her daughter is present during today's visit. They voice no new pedal problems on today's visit.   PCP is Lindell Spar, MD , and last visit was 06/23/2021.  Allergies  Allergen Reactions   Codeine    Erythromycin    Morphine And Related     loopy   Penicillins Hives and Swelling    Has patient had a PCN reaction causing immediate rash, facial/tongue/throat swelling, SOB or lightheadedness with hypotension: Yes Has patient had a PCN reaction causing severe rash involving mucus membranes or skin necrosis: No Has patient had a PCN reaction that required hospitalization No Has patient had a PCN reaction occurring within the last 10 years: No  If all of the above answers are "NO", then may proceed with Cephalosporin use.     Review of Systems: Negative except as noted in the HPI. Objective:   Constitutional Brittany Mahoney is a pleasant 86 y.o. Caucasian female, WD, WN in NAD. AAO x 3.   Vascular CFT <3 seconds b/l LE. Palpable DP/PT pulses b/l LE. Digital hair sparse b/l. Skin temperature gradient WNL b/l. No pain with calf compression b/l. No edema noted b/l. Lower extremity skin temperature gradient within normal limits. No cyanosis or clubbing noted.  Neurologic Normal speech. Oriented to person, place, and time. Protective sensation intact 5/5 intact bilaterally with 10g monofilament b/l. Vibratory sensation intact b/l.  Dermatologic Pedal integument with normal  turgor, texture and tone b/l LE. No open wounds b/l. No interdigital macerations b/l. Toenails 1-5 b/l elongated, thickened, discolored with subungual debris. +Tenderness with dorsal palpation of nailplates. Porokeratotic lesion(s) noted R 2nd toe.  Orthopedic: Normal muscle strength 5/5 to all lower extremity muscle groups bilaterally. Hammertoe deformity noted 2-5 b/l. Pes planovalgus deformity noted b/l lower extremities.   Radiographs: None Assessment:   1. Pain due to onychomycosis of toenails of both feet   2. Porokeratosis   3. Pain in right toe(s)    Plan:  Patient was evaluated and treated and all questions answered. Consent given for treatment as described below: -No new findings. No new orders. -Medicare ABN signed for this year. Patient consents for services of paring of porokeratosis right 3rd toe  today. Copy has been placed in patient's chart. -Toenails 1-5 left and 1-5 right debrided in length and girth without iatrogenic bleeding with sterile nail nipper and dremel.  -Painful porokeratotic lesion(s) R 2nd toe pared and enucleated with sterile scalpel blade without incident. Total number of lesions debrided=1. -Patient/POA to call should there be question/concern in the interim.  Return in about 3 months (around 10/14/2021).  Marzetta Board, DPM

## 2021-07-17 NOTE — Telephone Encounter (Signed)
LVM if Brittany Mahoney calls back please provider a little more specific information in case I cant reach him again

## 2021-07-18 NOTE — Telephone Encounter (Signed)
Brittany Mahoney returning call checking on the status of Living will screening form if it has been signed already.  Call back # [phone]

## 2021-07-18 NOTE — Telephone Encounter (Signed)
Called Josh lvm to fax over the form.

## 2021-07-19 ENCOUNTER — Ambulatory Visit (INDEPENDENT_AMBULATORY_CARE_PROVIDER_SITE_OTHER): Payer: Medicare Other | Admitting: Internal Medicine

## 2021-07-20 DIAGNOSIS — H401132 Primary open-angle glaucoma, bilateral, moderate stage: Secondary | ICD-10-CM | POA: Diagnosis not present

## 2021-08-07 DIAGNOSIS — M19011 Primary osteoarthritis, right shoulder: Secondary | ICD-10-CM | POA: Diagnosis not present

## 2021-08-14 DIAGNOSIS — Z20822 Contact with and (suspected) exposure to covid-19: Secondary | ICD-10-CM | POA: Diagnosis not present

## 2021-08-28 DIAGNOSIS — Z20822 Contact with and (suspected) exposure to covid-19: Secondary | ICD-10-CM | POA: Diagnosis not present

## 2021-08-31 DIAGNOSIS — Z20822 Contact with and (suspected) exposure to covid-19: Secondary | ICD-10-CM | POA: Diagnosis not present

## 2021-09-15 ENCOUNTER — Other Ambulatory Visit: Payer: Self-pay

## 2021-09-15 ENCOUNTER — Encounter: Payer: Self-pay | Admitting: Podiatry

## 2021-09-15 ENCOUNTER — Ambulatory Visit (INDEPENDENT_AMBULATORY_CARE_PROVIDER_SITE_OTHER): Payer: Medicare Other | Admitting: Podiatry

## 2021-09-15 DIAGNOSIS — M79675 Pain in left toe(s): Secondary | ICD-10-CM | POA: Diagnosis not present

## 2021-09-15 DIAGNOSIS — M79674 Pain in right toe(s): Secondary | ICD-10-CM

## 2021-09-15 DIAGNOSIS — Q828 Other specified congenital malformations of skin: Secondary | ICD-10-CM | POA: Diagnosis not present

## 2021-09-15 DIAGNOSIS — B351 Tinea unguium: Secondary | ICD-10-CM | POA: Diagnosis not present

## 2021-09-15 NOTE — Progress Notes (Signed)
°  Subjective:  Patient ID: Brittany Mahoney, female    DOB: 1934-12-21,  MRN: [id number]  86 y.o. female presents painful porokeratotic lesion(s) right 2nd toe and painful mycotic toenails that limit ambulation. Painful toenails interfere with ambulation. Aggravating factors include wearing enclosed shoe gear. Pain is relieved with periodic professional debridement. Painful porokeratotic lesions are aggravated when weightbearing with and without shoegear. Pain is relieved with periodic professional debridement.  Patient is a former Furniture conservator/restorer. She is accompanied by her daughter on today's visit.    Brittany Mahoney voices no new pedal concerns on today's visit.  PCP is Brittany Spar, MD , and last visit was 06/23/2021.  Allergies  Allergen Reactions   Codeine    Erythromycin    Morphine And Related     loopy   Penicillins Hives and Swelling    Has patient had a PCN reaction causing immediate rash, facial/tongue/throat swelling, SOB or lightheadedness with hypotension: Yes Has patient had a PCN reaction causing severe rash involving mucus membranes or skin necrosis: No Has patient had a PCN reaction that required hospitalization No Has patient had a PCN reaction occurring within the last 10 years: No  If all of the above answers are "NO", then may proceed with Cephalosporin use.     Review of Systems: Negative except as noted in the HPI.   Objective:  Vascular Examination: CFT <3 seconds b/l LE. Faintly palpable DP pulses b/l LE. Faintly palpable PT pulse(s) b/l LE. Pedal hair sparse. No edema noted b/l LE. No ischemia or gangrene noted b/l LE. No cyanosis or clubbing noted b/l LE.  Neurological Examination: Sensation grossly intact b/l with 10 gram monofilament. Vibratory sensation intact b/l.  Dermatological Examination: Pedal skin thin and atrophic b/l LE. No open wounds b/l LE. No interdigital macerations noted b/l LE. Toenails 1-5 b/l elongated, discolored, dystrophic, thickened,  crumbly with subungual debris and tenderness to dorsal palpation. Porokeratotic lesion(s) R 2nd toe. No erythema, no edema, no drainage, no fluctuance.  Musculoskeletal Examination: Muscle strength 5/5 to all lower extremity muscle groups bilaterally. Pes planovalgus deformity noted b/l lower extremities. Utilizes rollator for ambulation assistance.  Radiographs: None   Assessment:   1. Pain due to onychomycosis of toenails of both feet   2. Porokeratosis   3. Pain in right toe(s)    Plan:  -Examined patient. -Medicare ABN on file for CPT 11055. -Mycotic toenails 1-5 bilaterally were debrided in length and girth with sterile nail nippers and dremel without incident. -Painful porokeratotic lesion(s) R 2nd toe pared and enucleated with sterile scalpel blade without incident. Total number of lesions debrided=1. -Patient/POA to call should there be question/concern in the interim.  Return in about 10 weeks (around 11/24/2021).  Marzetta Board, DPM

## 2021-09-18 DIAGNOSIS — Z20822 Contact with and (suspected) exposure to covid-19: Secondary | ICD-10-CM | POA: Diagnosis not present

## 2021-09-20 DIAGNOSIS — U071 COVID-19: Secondary | ICD-10-CM | POA: Diagnosis not present

## 2021-10-02 DIAGNOSIS — Z20822 Contact with and (suspected) exposure to covid-19: Secondary | ICD-10-CM | POA: Diagnosis not present

## 2021-10-05 DIAGNOSIS — Z20822 Contact with and (suspected) exposure to covid-19: Secondary | ICD-10-CM | POA: Diagnosis not present

## 2021-10-11 DIAGNOSIS — Z20822 Contact with and (suspected) exposure to covid-19: Secondary | ICD-10-CM | POA: Diagnosis not present

## 2021-10-19 DIAGNOSIS — Z20822 Contact with and (suspected) exposure to covid-19: Secondary | ICD-10-CM | POA: Diagnosis not present

## 2021-10-23 DIAGNOSIS — Z20822 Contact with and (suspected) exposure to covid-19: Secondary | ICD-10-CM | POA: Diagnosis not present

## 2021-10-25 DIAGNOSIS — Z20822 Contact with and (suspected) exposure to covid-19: Secondary | ICD-10-CM | POA: Diagnosis not present

## 2021-11-02 DIAGNOSIS — Z20822 Contact with and (suspected) exposure to covid-19: Secondary | ICD-10-CM | POA: Diagnosis not present

## 2021-11-03 ENCOUNTER — Encounter: Payer: Self-pay | Admitting: Internal Medicine

## 2021-11-03 ENCOUNTER — Ambulatory Visit (INDEPENDENT_AMBULATORY_CARE_PROVIDER_SITE_OTHER): Payer: Medicare Other | Admitting: Internal Medicine

## 2021-11-03 ENCOUNTER — Other Ambulatory Visit: Payer: Self-pay

## 2021-11-03 VITALS — BP 124/70 | HR 92 | Resp 17 | Ht 66.0 in | Wt 145.6 lb

## 2021-11-03 DIAGNOSIS — G309 Alzheimer's disease, unspecified: Secondary | ICD-10-CM | POA: Diagnosis not present

## 2021-11-03 DIAGNOSIS — F02B18 Dementia in other diseases classified elsewhere, moderate, with other behavioral disturbance: Secondary | ICD-10-CM

## 2021-11-03 DIAGNOSIS — M810 Age-related osteoporosis without current pathological fracture: Secondary | ICD-10-CM

## 2021-11-03 DIAGNOSIS — R5381 Other malaise: Secondary | ICD-10-CM | POA: Diagnosis not present

## 2021-11-03 NOTE — Assessment & Plan Note (Signed)
Mostly short-term memory issues with mild agitation at times Independent with ADLs Prefers to not take any medication for now Lives with daughter, who helps with IADLs. 

## 2021-11-03 NOTE — Assessment & Plan Note (Signed)
Has chronic muscle weakness, likely age-related muscular atrophy Has done PT in the past, but did not benefit much Offered home PT, she wants to think about it.

## 2021-11-03 NOTE — Progress Notes (Signed)
Established Patient Office Visit  Subjective:  Patient ID: Brittany Mahoney, female    DOB: October 22, 1934  Age: 87 y.o. MRN: [id number]  CC:  Chief Complaint  Patient presents with   Osteoarthritis    Follow up     HPI Brittany Mahoney is a 87 y.o. female with past medical history of osteoporosis, OA of knee, endometrial cancer s/p TAH, dementia and recurrent falls who presents for f/u of her chronic medical conditions. Her daughter is present during the visit today.  She has a history of osteoporosis, for which she takes vitamin D supplement and HRT with estradiol and progesterone. Of note, she was on Fosamax in the past for 3-4 years.  She has had recurrent falls and had a femur fracture, s/p repair.  She has chronic gait instability, she uses a walker for walking support.  She has not had any falls since the last visit.  She is advised to get Shingrix vaccine at her local pharmacy.     Past Medical History:  Diagnosis Date   Age-related osteoporosis without current pathological fracture    Arthritis    Arthropathy, unspecified    Broken hip ([place])    Cancer ([place])    uterus   Chronic kidney disease, stage 1    Closed right hip fracture ([place]) 04/09/2016   Endometrial cancer, grade I ([place]) 03/21/2015   Fracture of unspecified part of neck of right femur, sequela    Glaucoma    Headache(784.0)    migraines after periods   Hepatitis    Hx of cancer of endometrium 03/06/2013   Osteoporosis    Other abnormalities of gait and mobility    Other amnesia    Pain in left knee    Pain in right foot    Pain in right shoulder    Personal history of (healed) traumatic fracture    PMB (postmenopausal bleeding)    Repeated falls    Tremor, unspecified     Past Surgical History:  Procedure Laterality Date   ABDOMINAL HYSTERECTOMY  2007   TAH and BSO   COLONOSCOPY  07/31/2006   Dr.Rourk   DILATION AND CURETTAGE OF UTERUS     HIP FRACTURE SURGERY     INTRAMEDULLARY (IM) NAIL  INTERTROCHANTERIC Right 04/10/2016   Procedure: INTRAMEDULLARY (IM) NAIL RIGHT HIP FRACTURE;  Surgeon: Mcarthur Rossetti, MD;  Location: [facility];  Service: Orthopedics;  Laterality: Right;    Family History  Problem Relation Age of Onset   Stroke Maternal Grandfather    Stroke Paternal Grandfather    Cancer Mother    Cancer Father        lymphatic sarcoma   Early death Sister    Early death Son    Diabetes Son    Heart disease Other    Cancer Other    Tuberculosis Other    Diabetes Other    Thyroid disease Other    Stroke Other    Heart disease Maternal Aunt    Heart disease Maternal Uncle     Social History   Socioeconomic History   Marital status: Widowed    Spouse name: Not on file   Number of children: Not on file   Years of education: Not on file   Highest education level: Not on file  Occupational History   Not on file  Tobacco Use   Smoking status: Never   Smokeless tobacco: Never  Vaping Use   Vaping Use: Never used  Substance and  Sexual Activity   Alcohol use: No   Drug use: No   Sexual activity: Not Currently    Birth control/protection: Surgical  Other Topics Concern   Not on file  Social History Narrative   Widow since 2016,married for 55 years.Lives with daughter.Occupational hygienist for [employer] ,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   Social Determinants of Health   Financial Resource Strain: Low Risk    Difficulty of Paying Living Expenses: Not hard at all  Food Insecurity: No Food Insecurity   Worried About Charity fundraiser in the Last Year: Never true   [place] in the Last Year: Never true  Transportation Needs: No Transportation Needs   Lack of Transportation (Medical): No   Lack of Transportation (Non-Medical): No  Physical Activity: Inactive   Days of Exercise per Week: 0 days   Minutes of Exercise per Session: 0 min  Stress: No Stress Concern Present   Feeling of Stress : Not at all  Social Connections: Socially Isolated    Frequency of Communication with Friends and Family: More than three times a week   Frequency of Social Gatherings with Friends and Family: More than three times a week   Attends Religious Services: Never   Marine scientist or Organizations: No   Attends Archivist Meetings: Never   Marital Status: Widowed  Human resources officer Violence: Not At Risk   Fear of Current or Ex-Partner: No   Emotionally Abused: No   Physically Abused: No   Sexually Abused: No    Outpatient Medications Prior to Visit  Medication Sig Dispense Refill   Calcium Carb-Cholecalciferol (CALTRATE 600+D3 PO) Take by mouth.     Cranberry 500 MG CAPS Take 500 mg by mouth daily.     estradiol (ESTRACE) 2 MG tablet Take 1 tablet (2 mg total) by mouth daily. 90 tablet 1   LUMIGAN 0.01 % SOLN INSTILL 1 DROP IN INTO BOTH EYES AT BEDTIME  6   naproxen sodium (ALEVE) 220 MG tablet Take 220 mg by mouth daily as needed.     NONFORMULARY OR COMPOUNDED ITEM Antifungal solution: Terbinafine 3%, Fluconazole 2%, Tea Tree Oil 5%, Urea 10%, Ibuprofen 2% in DMSO suspension #61mL 1 each 3   progesterone (PROMETRIUM) 200 MG capsule Take 1 capsule (200 mg total) by mouth daily. 90 capsule 1   Cholecalciferol 1.25 MG (50000 UT) TABS Take 5,000 Int'l Units by mouth daily. (Patient not taking: Reported on 11/03/2021)     Nutritional Supplements (DHEA PO) Take 15 mg by mouth every morning. (Patient not taking: Reported on 11/03/2021)     No facility-administered medications prior to visit.    Allergies  Allergen Reactions   Codeine    Erythromycin    Morphine And Related     loopy   Penicillins Hives and Swelling    Has patient had a PCN reaction causing immediate rash, facial/tongue/throat swelling, SOB or lightheadedness with hypotension: Yes Has patient had a PCN reaction causing severe rash involving mucus membranes or skin necrosis: No Has patient had a PCN reaction that required hospitalization No Has patient had a PCN  reaction occurring within the last 10 years: No  If all of the above answers are "NO", then may proceed with Cephalosporin use.     ROS Review of Systems  Constitutional:  Negative for chills and fever.  HENT:  Negative for congestion, sinus pressure, sinus pain and sore throat.   Eyes:  Negative for pain and discharge.  Respiratory:  Negative for cough and shortness of breath.   Cardiovascular:  Negative for chest pain and palpitations.  Gastrointestinal:  Negative for abdominal pain, diarrhea, nausea and vomiting.  Endocrine: Negative for polydipsia and polyuria.  Genitourinary:  Negative for dysuria and hematuria.  Musculoskeletal:  Positive for arthralgias, back pain and gait problem. Negative for neck pain and neck stiffness.  Skin:  Negative for rash.  Neurological:  Positive for tremors. Negative for dizziness and weakness.  Psychiatric/Behavioral:  Negative for agitation and behavioral problems.      Objective:    Physical Exam Vitals reviewed.  Constitutional:      General: She is not in acute distress.    Appearance: She is not diaphoretic.  HENT:     Head: Normocephalic and atraumatic.     Nose: Nose normal.     Mouth/Throat:     Mouth: Mucous membranes are moist.  Eyes:     General: No scleral icterus.    Extraocular Movements: Extraocular movements intact.  Cardiovascular:     Rate and Rhythm: Normal rate and regular rhythm.     Pulses: Normal pulses.     Heart sounds: Normal heart sounds. No murmur heard. Pulmonary:     Breath sounds: Normal breath sounds. No wheezing or rales.  Musculoskeletal:     Cervical back: Neck supple. No tenderness.     Right lower leg: No edema.     Left lower leg: No edema.  Skin:    General: Skin is warm.     Comments: Livedo reticularis over b/l legs, chronic  Neurological:     General: No focal deficit present.     Mental Status: She is alert and oriented to person, place, and time. Mental status is at baseline.      Motor: Weakness (3/5 in b/l LE, 2/5 in b/l LE) present.  Psychiatric:        Mood and Affect: Mood normal.        Behavior: Behavior normal.    BP 124/70    Pulse 92    Resp 17    Ht 5\' 6"  (1.676 m)    Wt 145 lb 9.6 oz (66 kg)    SpO2 93%    BMI 23.50 kg/m  Wt Readings from Last 3 Encounters:  11/03/21 145 lb 9.6 oz (66 kg)  06/23/21 148 lb 1.3 oz (67.2 kg)  01/11/21 152 lb 3.2 oz (69 kg)    Lab Results  Component Value Date   TSH 2.10 03/09/2020   Lab Results  Component Value Date   WBC 8.6 03/09/2020   HGB 14.8 03/09/2020   HCT 46.0 (H) 03/09/2020   MCV 94.1 03/09/2020   PLT 307 03/09/2020   Lab Results  Component Value Date   NA 141 09/14/2020   K 4.6 09/14/2020   CO2 24 09/14/2020   GLUCOSE 83 09/14/2020   BUN 14 09/14/2020   CREATININE 0.98 (H) 09/14/2020   BILITOT 1.1 09/14/2020   ALKPHOS 88 04/20/2019   AST 13 09/14/2020   ALT 14 09/14/2020   PROT 7.4 09/14/2020   ALBUMIN 4.1 04/20/2019   CALCIUM 10.4 09/14/2020   ANIONGAP 7 08/15/2016   Lab Results  Component Value Date   CHOL 125 03/09/2020   Lab Results  Component Value Date   HDL 40 (L) 03/09/2020   Lab Results  Component Value Date   LDLCALC 70 03/09/2020   Lab Results  Component Value Date   TRIG 74 03/09/2020   Lab  Results  Component Value Date   CHOLHDL 3.1 03/09/2020   No results found for: HGBA1C    Assessment & Plan:   Problem List Items Addressed This Visit       Nervous and Auditory   Dementia ([place])    Mostly short-term memory issues with mild agitation at times Independent with ADLs Prefers to not take any medication for now Lives with daughter, who helps with IADLs.        Musculoskeletal and Integument   Osteoporosis - Primary    Last DEXA scan reviewed Was on Fosamax in the past Currently on Caltrate plus vitamin D and HRT with estradiol and progesterone Have discussed about side effects of HRT in her case, would avoid continuing HRT for a long time -  planning to stop it now      Relevant Medications   Calcium Carb-Cholecalciferol (CALTRATE 600+D3 PO)     Other   Physical deconditioning    Has chronic muscle weakness, likely age-related muscular atrophy Has done PT in the past, but did not benefit much Offered home PT, she wants to think about it.       No orders of the defined types were placed in this encounter.   Follow-up: Return in about 6 months (around 05/03/2022) for Osteoporosis.    Lindell Spar, MD

## 2021-11-03 NOTE — Assessment & Plan Note (Signed)
Last DEXA scan reviewed Was on Fosamax in the past Currently on Caltrate plus vitamin D and HRT with estradiol and progesterone Have discussed about side effects of HRT in her case, would avoid continuing HRT for a long time - planning to stop it now

## 2021-11-03 NOTE — Patient Instructions (Signed)
Please continue to take supplements as prescribed.  Please consider getting Shingrix vaccine at your local pharmacy.

## 2021-11-04 DIAGNOSIS — Z20822 Contact with and (suspected) exposure to covid-19: Secondary | ICD-10-CM | POA: Diagnosis not present

## 2021-11-05 DIAGNOSIS — Z20822 Contact with and (suspected) exposure to covid-19: Secondary | ICD-10-CM | POA: Diagnosis not present

## 2021-11-08 DIAGNOSIS — Z20822 Contact with and (suspected) exposure to covid-19: Secondary | ICD-10-CM | POA: Diagnosis not present

## 2021-11-14 DIAGNOSIS — Z20822 Contact with and (suspected) exposure to covid-19: Secondary | ICD-10-CM | POA: Diagnosis not present

## 2021-11-18 DIAGNOSIS — Z20822 Contact with and (suspected) exposure to covid-19: Secondary | ICD-10-CM | POA: Diagnosis not present

## 2021-11-22 DIAGNOSIS — Z20822 Contact with and (suspected) exposure to covid-19: Secondary | ICD-10-CM | POA: Diagnosis not present

## 2021-11-24 ENCOUNTER — Ambulatory Visit (INDEPENDENT_AMBULATORY_CARE_PROVIDER_SITE_OTHER): Payer: Medicare Other | Admitting: Podiatry

## 2021-11-24 ENCOUNTER — Encounter: Payer: Self-pay | Admitting: Podiatry

## 2021-11-24 ENCOUNTER — Other Ambulatory Visit: Payer: Self-pay

## 2021-11-24 DIAGNOSIS — M79674 Pain in right toe(s): Secondary | ICD-10-CM | POA: Diagnosis not present

## 2021-11-24 DIAGNOSIS — M79675 Pain in left toe(s): Secondary | ICD-10-CM

## 2021-11-24 DIAGNOSIS — B351 Tinea unguium: Secondary | ICD-10-CM

## 2021-11-24 DIAGNOSIS — Q828 Other specified congenital malformations of skin: Secondary | ICD-10-CM

## 2021-11-25 DIAGNOSIS — Z20822 Contact with and (suspected) exposure to covid-19: Secondary | ICD-10-CM | POA: Diagnosis not present

## 2021-11-26 DIAGNOSIS — Z20822 Contact with and (suspected) exposure to covid-19: Secondary | ICD-10-CM | POA: Diagnosis not present

## 2021-11-28 DIAGNOSIS — Z20822 Contact with and (suspected) exposure to covid-19: Secondary | ICD-10-CM | POA: Diagnosis not present

## 2021-11-28 DIAGNOSIS — Z01812 Encounter for preprocedural laboratory examination: Secondary | ICD-10-CM | POA: Diagnosis not present

## 2021-11-29 DIAGNOSIS — Z20822 Contact with and (suspected) exposure to covid-19: Secondary | ICD-10-CM | POA: Diagnosis not present

## 2021-12-03 ENCOUNTER — Encounter: Payer: Self-pay | Admitting: Podiatry

## 2021-12-03 NOTE — Progress Notes (Signed)
?  Subjective:  ?Patient ID: Brittany Mahoney, female    DOB: 05/11/1935,  MRN: [id number] ? ?MCKENZE SLONE presents to clinic today for corn(s) R 2nd toe and painful thick toenails that are difficult to trim. Painful toenails interfere with ambulation. Aggravating factors include wearing enclosed shoe gear. Pain is relieved with periodic professional debridement. Painful corns are aggravated when weightbearing when wearing enclosed shoe gear. Pain is relieved with periodic professional debridement. ? ?New problem(s): None.  ? ?PCP is Lindell Spar, MD , and last visit was November 03, 2021. ? ?Allergies  ?Allergen Reactions  ? Codeine   ? Erythromycin   ? Morphine And Related   ?  loopy  ? Penicillins Hives and Swelling  ?  Has patient had a PCN reaction causing immediate rash, facial/tongue/throat swelling, SOB or lightheadedness with hypotension: Yes ?Has patient had a PCN reaction causing severe rash involving mucus membranes or skin necrosis: No ?Has patient had a PCN reaction that required hospitalization No ?Has patient had a PCN reaction occurring within the last 10 years: No  ?If all of the above answers are "NO", then may proceed with Cephalosporin use. ?  ? ? ?Review of Systems: Negative except as noted in the HPI. ? ?Objective: No changes noted in today's physical examination. ?Vascular Examination: ?CFT <3 seconds b/l LE. Faintly palpable DP pulses b/l LE. Faintly palpable PT pulse(s) b/l LE. Pedal hair sparse. No edema noted b/l LE. No ischemia or gangrene noted b/l LE. No cyanosis or clubbing noted b/l LE. ? ?Neurological Examination: ?Sensation grossly intact b/l with 10 gram monofilament. Vibratory sensation intact b/l. ? ?Dermatological Examination: ?Pedal skin thin and atrophic b/l LE. No open wounds b/l LE. No interdigital macerations noted b/l LE. Toenails 1-5 b/l elongated, discolored, dystrophic, thickened, crumbly with subungual debris and tenderness to dorsal palpation. Porokeratotic lesion(s)  R 2nd toe. No erythema, no edema, no drainage, no fluctuance. ? ?Musculoskeletal Examination: ?Muscle strength 5/5 to all lower extremity muscle groups bilaterally. Pes planovalgus deformity noted b/l lower extremities. Utilizes rollator for ambulation assistance. ? ?Radiographs: None ? ?Assessment/Plan: ?1. Pain due to onychomycosis of toenails of both feet   ?2. Porokeratosis   ?3. Pain in right toe(s)   ?-Patient was evaluated and treated. All patient's and/or POA's questions/concerns answered on today's visit. ?-Medicare ABN signed. Patient consents for services of paring of corn  today. Copy has been placed in patient's chart. ?-Toenails 1-5 b/l were debrided in length and girth with sterile nail nippers and dremel without iatrogenic bleeding.  ?-Painful porokeratotic lesion(s) R 2nd toe pared and enucleated with sterile scalpel blade without incident. Total number of lesions debrided=1. ?-Patient/POA to call should there be question/concern in the interim.  ? ?Return in about 9 weeks (around 01/26/2022). ? ?Marzetta Board, DPM  ?

## 2021-12-08 DIAGNOSIS — Z20822 Contact with and (suspected) exposure to covid-19: Secondary | ICD-10-CM | POA: Diagnosis not present

## 2021-12-09 DIAGNOSIS — Z20822 Contact with and (suspected) exposure to covid-19: Secondary | ICD-10-CM | POA: Diagnosis not present

## 2021-12-10 DIAGNOSIS — Z20822 Contact with and (suspected) exposure to covid-19: Secondary | ICD-10-CM | POA: Diagnosis not present

## 2021-12-15 DIAGNOSIS — Z20822 Contact with and (suspected) exposure to covid-19: Secondary | ICD-10-CM | POA: Diagnosis not present

## 2021-12-19 DIAGNOSIS — Z20822 Contact with and (suspected) exposure to covid-19: Secondary | ICD-10-CM | POA: Diagnosis not present

## 2022-01-01 DIAGNOSIS — Z20828 Contact with and (suspected) exposure to other viral communicable diseases: Secondary | ICD-10-CM | POA: Diagnosis not present

## 2022-01-04 DIAGNOSIS — Z20822 Contact with and (suspected) exposure to covid-19: Secondary | ICD-10-CM | POA: Diagnosis not present

## 2022-01-08 DIAGNOSIS — Z20822 Contact with and (suspected) exposure to covid-19: Secondary | ICD-10-CM | POA: Diagnosis not present

## 2022-01-09 DIAGNOSIS — Z20822 Contact with and (suspected) exposure to covid-19: Secondary | ICD-10-CM | POA: Diagnosis not present

## 2022-01-11 DIAGNOSIS — Z20822 Contact with and (suspected) exposure to covid-19: Secondary | ICD-10-CM | POA: Diagnosis not present

## 2022-01-17 DIAGNOSIS — Z23 Encounter for immunization: Secondary | ICD-10-CM | POA: Diagnosis not present

## 2022-02-08 ENCOUNTER — Other Ambulatory Visit (HOSPITAL_COMMUNITY): Payer: Self-pay | Admitting: Internal Medicine

## 2022-02-08 DIAGNOSIS — Z1231 Encounter for screening mammogram for malignant neoplasm of breast: Secondary | ICD-10-CM

## 2022-02-09 ENCOUNTER — Encounter: Payer: Self-pay | Admitting: Podiatry

## 2022-02-09 ENCOUNTER — Ambulatory Visit (INDEPENDENT_AMBULATORY_CARE_PROVIDER_SITE_OTHER): Payer: Medicare Other | Admitting: Podiatry

## 2022-02-09 DIAGNOSIS — M79675 Pain in left toe(s): Secondary | ICD-10-CM | POA: Diagnosis not present

## 2022-02-09 DIAGNOSIS — M79674 Pain in right toe(s): Secondary | ICD-10-CM

## 2022-02-09 DIAGNOSIS — B351 Tinea unguium: Secondary | ICD-10-CM | POA: Diagnosis not present

## 2022-02-15 NOTE — Progress Notes (Signed)
  Subjective:  Patient ID: Brittany Mahoney, female    DOB: Jan 18, 1935,  MRN: [id number]  EMBERLIE GOTCHER presents to clinic today for corn(s) R 2nd toe and painful thick toenails that are difficult to trim. Painful toenails interfere with ambulation. Aggravating factors include wearing enclosed shoe gear. Pain is relieved with periodic professional debridement. Painful corns are aggravated when weightbearing when wearing enclosed shoe gear. Pain is relieved with periodic professional debridement.  Patient is accompanied by her daughter on today's visit. They voice no new pedal concerns on today's visit.  PCP is Lindell Spar, MD , and last visit was November 03, 2021.  Allergies  Allergen Reactions   Codeine    Erythromycin    Morphine And Related     loopy   Penicillins Hives and Swelling    Has patient had a PCN reaction causing immediate rash, facial/tongue/throat swelling, SOB or lightheadedness with hypotension: Yes Has patient had a PCN reaction causing severe rash involving mucus membranes or skin necrosis: No Has patient had a PCN reaction that required hospitalization No Has patient had a PCN reaction occurring within the last 10 years: No  If all of the above answers are "NO", then may proceed with Cephalosporin use.     Review of Systems: Negative except as noted in the HPI.  Objective:   Vascular Examination: CFT <3 seconds b/l LE. Faintly palpable DP pulses b/l LE. Faintly palpable PT pulse(s) b/l LE. Pedal hair sparse. No edema noted b/l LE. No ischemia or gangrene noted b/l LE. No cyanosis or clubbing noted b/l LE.  Neurological Examination: Sensation grossly intact b/l with 10 gram monofilament. Vibratory sensation intact b/l.  Dermatological Examination: Pedal skin thin and atrophic b/l LE. No open wounds b/l LE. No interdigital macerations noted b/l LE. Toenails 1-5 b/l elongated, discolored, dystrophic, thickened, crumbly with subungual debris and tenderness to  dorsal palpation. Minimal hyperkeratosis right 2nd digit  Musculoskeletal Examination: Muscle strength 5/5 to all lower extremity muscle groups bilaterally. Pes planovalgus deformity noted b/l lower extremities. Utilizes rollator for ambulation assistance.  Radiographs: None  Assessment/Plan: 1. Pain due to onychomycosis of toenails of both feet     -Examined patient. -Minimal hyperkeratosis right 2nd digit. -Medicare ABN on file for paring of corn(s)/callus(es). -Patient to continue soft, supportive shoe gear daily. -Mycotic toenails 1-5 bilaterally were debrided in length and girth with sterile nail nippers and dremel without incident. -Patient/POA to call should there be question/concern in the interim.   Return in about 9 weeks (around 04/13/2022).  Marzetta Board, DPM

## 2022-02-22 ENCOUNTER — Ambulatory Visit (HOSPITAL_COMMUNITY)
Admission: RE | Admit: 2022-02-22 | Discharge: 2022-02-22 | Disposition: A | Discharge status: Home / Self Care | Payer: Medicare Other | Source: Ambulatory Visit | Attending: Internal Medicine | Admitting: Internal Medicine

## 2022-02-22 DIAGNOSIS — H401132 Primary open-angle glaucoma, bilateral, moderate stage: Secondary | ICD-10-CM | POA: Diagnosis not present

## 2022-02-22 DIAGNOSIS — Z1231 Encounter for screening mammogram for malignant neoplasm of breast: Secondary | ICD-10-CM | POA: Diagnosis not present

## 2022-04-13 ENCOUNTER — Encounter: Payer: Self-pay | Admitting: Podiatry

## 2022-04-13 ENCOUNTER — Ambulatory Visit (INDEPENDENT_AMBULATORY_CARE_PROVIDER_SITE_OTHER): Payer: Medicare Other | Admitting: Podiatry

## 2022-04-13 DIAGNOSIS — M79674 Pain in right toe(s): Secondary | ICD-10-CM | POA: Diagnosis not present

## 2022-04-13 DIAGNOSIS — Q828 Other specified congenital malformations of skin: Secondary | ICD-10-CM | POA: Diagnosis not present

## 2022-04-13 DIAGNOSIS — B351 Tinea unguium: Secondary | ICD-10-CM

## 2022-04-13 DIAGNOSIS — M79675 Pain in left toe(s): Secondary | ICD-10-CM

## 2022-04-22 NOTE — Progress Notes (Signed)
  Subjective:  Patient ID: Brittany Mahoney, female    DOB: [birth year],  MRN: [id number]  Brittany Mahoney presents to clinic today for painful porokeratotic lesion(s) right lower extremity and painful mycotic toenails that limit ambulation. Painful toenails interfere with ambulation. Aggravating factors include wearing enclosed shoe gear. Pain is relieved with periodic professional debridement. Painful porokeratotic lesions are aggravated when weightbearing with and without shoegear. Pain is relieved with periodic professional debridement.  Patient is accompanied by her daughter on today's visit.  New problem(s): None.   PCP is Lindell Spar, MD , and last visit was  November 03, 2021  Allergies  Allergen Reactions   Codeine    Erythromycin    Morphine And Related     loopy   Penicillins Hives and Swelling    Has patient had a PCN reaction causing immediate rash, facial/tongue/throat swelling, SOB or lightheadedness with hypotension: Yes Has patient had a PCN reaction causing severe rash involving mucus membranes or skin necrosis: No Has patient had a PCN reaction that required hospitalization No Has patient had a PCN reaction occurring within the last 10 years: No  If all of the above answers are "NO", then may proceed with Cephalosporin use.     Review of Systems: Negative except as noted in the HPI.  Objective: No changes noted in today's physical examination.  Assessment/Plan: 1. Pain due to onychomycosis of toenails of both feet   2. Porokeratosis   3. Pain in right toe(s)     -Patient was evaluated and treated. All patient's and/or POA's questions/concerns answered on today's visit. -Medicare ABN signed for services of paring of corn(s)/callus(es)/porokeratos(es). Copy in patient chart. -Toenails 1-5 b/l were debrided in length and girth with sterile nail nippers and dremel without iatrogenic bleeding.  -Porokeratotic lesion(s) distal tip of right 2nd toe pared and enucleated  with sterile scalpel blade without incident. Total number of lesions debrided=1. -Patient/POA to call should there be question/concern in the interim.   Return in about 9 weeks (around 06/15/2022).  Marzetta Board, DPM

## 2022-05-04 ENCOUNTER — Encounter: Payer: Self-pay | Admitting: Internal Medicine

## 2022-05-04 ENCOUNTER — Ambulatory Visit (INDEPENDENT_AMBULATORY_CARE_PROVIDER_SITE_OTHER): Payer: Medicare Other | Admitting: Internal Medicine

## 2022-05-04 VITALS — BP 128/82 | HR 72 | Resp 18 | Ht 66.0 in | Wt 145.6 lb

## 2022-05-04 DIAGNOSIS — M810 Age-related osteoporosis without current pathological fracture: Secondary | ICD-10-CM

## 2022-05-04 DIAGNOSIS — M170 Bilateral primary osteoarthritis of knee: Secondary | ICD-10-CM | POA: Diagnosis not present

## 2022-05-04 DIAGNOSIS — R296 Repeated falls: Secondary | ICD-10-CM

## 2022-05-04 DIAGNOSIS — G309 Alzheimer's disease, unspecified: Secondary | ICD-10-CM

## 2022-05-04 DIAGNOSIS — F02B18 Dementia in other diseases classified elsewhere, moderate, with other behavioral disturbance: Secondary | ICD-10-CM

## 2022-05-04 DIAGNOSIS — E559 Vitamin D deficiency, unspecified: Secondary | ICD-10-CM | POA: Diagnosis not present

## 2022-05-04 MED ORDER — CELECOXIB 100 MG PO CAPS: 100.0000 mg | ORAL_CAPSULE | Freq: Two times a day (BID) | ORAL | 2 refills | Status: DC | PRN

## 2022-05-04 NOTE — Patient Instructions (Signed)
Please take Celebrex as needed for knee and hip pain.  Please maintain adequate hydration and avoid skipping any meals.  Please continue to use walking support to avoid falls.

## 2022-05-04 NOTE — Progress Notes (Signed)
Established Patient Office Visit  Subjective:  Patient ID: Brittany Mahoney, female    DOB: 12-06-1934  Age: 87 y.o. MRN: [id number]  CC:  Chief Complaint  Patient presents with   Follow-up    6 month follow up patient fell 1 month ago also having leg hip and feet pain for awhile now     HPI Brittany Mahoney is a 87 y.o. female with past medical history of osteoporosis, OA of knee, endometrial cancer s/p TAH, dementia and recurrent falls who presents for f/u of her chronic medical conditions. Her daughter is present during the visit today.  She had a fall at home about a month ago, exact date unknown.  She was walking to get to her mailbox and fell from her driveway on a ditch.  She had bruising on left UE and had mild soreness of left UE, left hip and left knee.  She did not have any bruising over LE.  Her neighbors helped her to get up and she could walk back to her home.  She did not get any medical evaluation done at that time.  She states that her left hip and leg pain have improved, but still has mild soreness.  She has more pain on the right hip and thigh area due to her old history of fracture of femur from a fall.  She tries taking Aleve, but has constipation with it.  She has stopped taking HRT now.  She takes Caltrate for osteoporosis. She still does not want PT.    Past Medical History:  Diagnosis Date   Age-related osteoporosis without current pathological fracture    Arthritis    Arthropathy, unspecified    Broken hip ([place])    Cancer ([place])    uterus   Chronic kidney disease, stage 1    Closed right hip fracture ([place]) 04/09/2016   Endometrial cancer, grade I ([place]) 03/21/2015   Fracture of unspecified part of neck of right femur, sequela    Glaucoma    Headache(784.0)    migraines after periods   Hepatitis    Hx of cancer of endometrium 03/06/2013   Osteoporosis    Other abnormalities of gait and mobility    Other amnesia    Pain in left knee    Pain in right foot     Pain in right shoulder    Personal history of (healed) traumatic fracture    PMB (postmenopausal bleeding)    Repeated falls    Tremor, unspecified     Past Surgical History:  Procedure Laterality Date   ABDOMINAL HYSTERECTOMY  2007   TAH and BSO   COLONOSCOPY  07/31/2006   Dr.Rourk   DILATION AND CURETTAGE OF UTERUS     HIP FRACTURE SURGERY     INTRAMEDULLARY (IM) NAIL INTERTROCHANTERIC Right 04/10/2016   Procedure: INTRAMEDULLARY (IM) NAIL RIGHT HIP FRACTURE;  Surgeon: Mcarthur Rossetti, MD;  Location: [facility];  Service: Orthopedics;  Laterality: Right;    Family History  Problem Relation Age of Onset   Stroke Maternal Grandfather    Stroke Paternal Grandfather    Cancer Mother    Cancer Father        lymphatic sarcoma   Early death Sister    Early death Son    Diabetes Son    Heart disease Other    Cancer Other    Tuberculosis Other    Diabetes Other    Thyroid disease Other    Stroke Other  Heart disease Maternal Aunt    Heart disease Maternal Uncle     Social History   Socioeconomic History   Marital status: Widowed    Spouse name: Not on file   Number of children: Not on file   Years of education: Not on file   Highest education level: Not on file  Occupational History   Not on file  Tobacco Use   Smoking status: Never   Smokeless tobacco: Never  Vaping Use   Vaping Use: Never used  Substance and Sexual Activity   Alcohol use: No   Drug use: No   Sexual activity: Not Currently    Birth control/protection: Surgical  Other Topics Concern   Not on file  Social History Narrative   Widow since 2016,married for 55 years.Lives with daughter.Occupational hygienist for [employer] ,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   Social Determinants of Health   Financial Resource Strain: Low Risk  (06/26/2021)   Overall Financial Resource Strain (CARDIA)    Difficulty of Paying Living Expenses: Not hard at all  Food Insecurity: No Food Insecurity (06/26/2021)   Hunger  Vital Sign    Worried About Running Out of Food in the Last Year: Never true    Ran Out of Food in the Last Year: Never true  Transportation Needs: No Transportation Needs (06/26/2021)   PRAPARE - Hydrologist (Medical): No    Lack of Transportation (Non-Medical): No  Physical Activity: Inactive (06/26/2021)   Exercise Vital Sign    Days of Exercise per Week: 0 days    Minutes of Exercise per Session: 0 min  Stress: No Stress Concern Present (06/26/2021)   [place]    Feeling of Stress : Not at all  Social Connections: Socially Isolated (06/26/2021)   Social Connection and Isolation Panel [NHANES]    Frequency of Communication with Friends and Family: More than three times a week    Frequency of Social Gatherings with Friends and Family: More than three times a week    Attends Religious Services: Never    Marine scientist or Organizations: No    Attends Archivist Meetings: Never    Marital Status: Widowed  Intimate Partner Violence: Not At Risk (06/26/2021)   Humiliation, Afraid, Rape, and Kick questionnaire    Fear of Current or Ex-Partner: No    Emotionally Abused: No    Physically Abused: No    Sexually Abused: No    Outpatient Medications Prior to Visit  Medication Sig Dispense Refill   Calcium Carb-Cholecalciferol (CALTRATE 600+D3 PO) Take by mouth.     Cholecalciferol 1.25 MG (50000 UT) TABS Take 5,000 Int'l Units by mouth daily.     Cranberry 500 MG CAPS Take 500 mg by mouth daily.     LUMIGAN 0.01 % SOLN INSTILL 1 DROP IN INTO BOTH EYES AT BEDTIME  6   NONFORMULARY OR COMPOUNDED ITEM Antifungal solution: Terbinafine 3%, Fluconazole 2%, Tea Tree Oil 5%, Urea 10%, Ibuprofen 2% in DMSO suspension #68m 1 each 3   Nutritional Supplements (DHEA PO) Take 15 mg by mouth every morning.     estradiol (ESTRACE) 2 MG tablet Take 1 tablet (2 mg total) by mouth daily. 90  tablet 1   naproxen sodium (ALEVE) 220 MG tablet Take 220 mg by mouth daily as needed.     progesterone (PROMETRIUM) 200 MG capsule Take 1 capsule (200 mg total) by mouth daily. 90 capsule  1   No facility-administered medications prior to visit.    Allergies  Allergen Reactions   Codeine    Erythromycin    Morphine And Related     loopy   Penicillins Hives and Swelling    Has patient had a PCN reaction causing immediate rash, facial/tongue/throat swelling, SOB or lightheadedness with hypotension: Yes Has patient had a PCN reaction causing severe rash involving mucus membranes or skin necrosis: No Has patient had a PCN reaction that required hospitalization No Has patient had a PCN reaction occurring within the last 10 years: No  If all of the above answers are "NO", then may proceed with Cephalosporin use.     ROS Review of Systems  Constitutional:  Negative for chills and fever.  HENT:  Negative for congestion, sinus pressure, sinus pain and sore throat.   Eyes:  Negative for pain and discharge.  Respiratory:  Negative for cough and shortness of breath.   Cardiovascular:  Negative for chest pain and palpitations.  Gastrointestinal:  Negative for abdominal pain, diarrhea, nausea and vomiting.  Endocrine: Negative for polydipsia and polyuria.  Genitourinary:  Negative for dysuria and hematuria.  Musculoskeletal:  Positive for arthralgias, back pain and gait problem. Negative for neck pain and neck stiffness.  Skin:  Negative for rash.  Neurological:  Positive for tremors. Negative for dizziness and weakness.  Psychiatric/Behavioral:  Negative for agitation and behavioral problems.       Objective:    Physical Exam Vitals reviewed.  Constitutional:      General: She is not in acute distress.    Appearance: She is not diaphoretic.  HENT:     Head: Normocephalic and atraumatic.     Nose: Nose normal.     Mouth/Throat:     Mouth: Mucous membranes are moist.  Eyes:      General: No scleral icterus.    Extraocular Movements: Extraocular movements intact.  Cardiovascular:     Rate and Rhythm: Normal rate and regular rhythm.     Pulses: Normal pulses.     Heart sounds: Normal heart sounds. No murmur heard. Pulmonary:     Breath sounds: Normal breath sounds. No wheezing or rales.  Musculoskeletal:        General: Tenderness (B/l hips) present.     Cervical back: Neck supple. No tenderness.     Right lower leg: No edema.     Left lower leg: No edema.  Skin:    General: Skin is warm.     Comments: Livedo reticularis over b/l legs, chronic  Neurological:     General: No focal deficit present.     Mental Status: She is alert and oriented to person, place, and time. Mental status is at baseline.     Motor: Weakness (3/5 in b/l LE, 2/5 in b/l LE) present.  Psychiatric:        Mood and Affect: Mood normal.        Behavior: Behavior normal.     BP 128/82 (BP Location: Left Arm, Patient Position: Sitting, Cuff Size: Normal)   Pulse 72   Resp 18   Ht '5\' 6"'$  (1.676 m)   Wt 145 lb 9.6 oz (66 kg)   SpO2 100%   BMI 23.50 kg/m  Wt Readings from Last 3 Encounters:  05/04/22 145 lb 9.6 oz (66 kg)  11/03/21 145 lb 9.6 oz (66 kg)  06/23/21 148 lb 1.3 oz (67.2 kg)    Lab Results  Component Value Date   TSH  2.10 03/09/2020   Lab Results  Component Value Date   WBC 8.6 03/09/2020   HGB 14.8 03/09/2020   HCT 46.0 (H) 03/09/2020   MCV 94.1 03/09/2020   PLT 307 03/09/2020   Lab Results  Component Value Date   NA 141 09/14/2020   K 4.6 09/14/2020   CO2 24 09/14/2020   GLUCOSE 83 09/14/2020   BUN 14 09/14/2020   CREATININE 0.98 (H) 09/14/2020   BILITOT 1.1 09/14/2020   ALKPHOS 88 04/20/2019   AST 13 09/14/2020   ALT 14 09/14/2020   PROT 7.4 09/14/2020   ALBUMIN 4.1 04/20/2019   CALCIUM 10.4 09/14/2020   ANIONGAP 7 08/15/2016   Lab Results  Component Value Date   CHOL 125 03/09/2020   Lab Results  Component Value Date   HDL 40 (L)  03/09/2020   Lab Results  Component Value Date   LDLCALC 70 03/09/2020   Lab Results  Component Value Date   TRIG 74 03/09/2020   Lab Results  Component Value Date   CHOLHDL 3.1 03/09/2020   No results found for: "HGBA1C"    Assessment & Plan:   Problem List Items Addressed This Visit       Nervous and Auditory   Dementia ([place])    Mostly short-term memory issues with mild agitation at times Independent with ADLs Prefers to not take any medication for now Lives with daughter, who helps with IADLs.      Relevant Orders   CBC   Basic Metabolic Panel (BMET)   TSH     Musculoskeletal and Integument   Osteoporosis - Primary    Last DEXA scan reviewed Was on Fosamax in the past Currently on Caltrate plus vitamin D  Stopped HRT with estradiol and progesterone Have discussed about side effects of HRT in her case, would avoid continuing HRT for a long time      OA (osteoarthritis) of knee    Chronic knee pain Advised to take Tylenol arthritis instead of Aleve Celebrex as needed for pain      Relevant Medications   celecoxib (CELEBREX) 100 MG capsule     Other   Repeated falls    Has had multiple falls due to gait problem Recent fall -  Had left UE bruising, resolved now Left LE pain improving, no concern for acute fracture for now as her pain has improved and she is able to walk with rolling walker, at her baseline Uses walker at home Advised for proper hydration and to avoid skipping any meal On avoid sudden positional changes      Other Visit Diagnoses     Vitamin D deficiency       Relevant Orders   Vitamin D (25 hydroxy)       Meds ordered this encounter  Medications   celecoxib (CELEBREX) 100 MG capsule    Sig: Take 1 capsule (100 mg total) by mouth 2 (two) times daily as needed for mild pain or moderate pain.    Dispense:  30 capsule    Refill:  2    Follow-up: Return in about 6 months (around 11/04/2022).    Lindell Spar, MD

## 2022-05-04 NOTE — Assessment & Plan Note (Signed)
Last DEXA scan reviewed Was on Fosamax in the past Currently on Caltrate plus vitamin D  Stopped HRT with estradiol and progesterone Have discussed about side effects of HRT in her case, would avoid continuing HRT for a long time

## 2022-05-04 NOTE — Assessment & Plan Note (Signed)
Mostly short-term memory issues with mild agitation at times Independent with ADLs Prefers to not take any medication for now Lives with daughter, who helps with IADLs. 

## 2022-05-04 NOTE — Assessment & Plan Note (Signed)
Chronic knee pain Advised to take Tylenol arthritis instead of Aleve Celebrex as needed for pain

## 2022-05-04 NOTE — Assessment & Plan Note (Signed)
Has had multiple falls due to gait problem Recent fall -  Had left UE bruising, resolved now Left LE pain improving, no concern for acute fracture for now as her pain has improved and she is able to walk with rolling walker, at her baseline Uses walker at home Advised for proper hydration and to avoid skipping any meal On avoid sudden positional changes

## 2022-05-05 LAB — CBC
Hematocrit: 41.7 % (ref 34.0–46.6)
Hemoglobin: 14.7 g/dL (ref 11.1–15.9)
MCH: 32.2 pg (ref 26.6–33.0)
MCHC: 35.3 g/dL (ref 31.5–35.7)
MCV: 91 fL (ref 79–97)
Platelets: 319 10*3/uL (ref 150–450)
RBC: 4.56 x10E6/uL (ref 3.77–5.28)
RDW: 11.2 % — ABNORMAL LOW (ref 11.7–15.4)
WBC: 9 10*3/uL (ref 3.4–10.8)

## 2022-05-05 LAB — BASIC METABOLIC PANEL
BUN/Creatinine Ratio: 12 (ref 12–28)
BUN: 12 mg/dL (ref 8–27)
CO2: 24 mmol/L (ref 20–29)
Calcium: 10.2 mg/dL (ref 8.7–10.3)
Chloride: 103 mmol/L (ref 96–106)
Creatinine, Ser: 0.99 mg/dL (ref 0.57–1.00)
Glucose: 80 mg/dL (ref 70–99)
Potassium: 4.5 mmol/L (ref 3.5–5.2)
Sodium: 141 mmol/L (ref 134–144)
eGFR: 56 mL/min/{1.73_m2} — ABNORMAL LOW (ref >59)

## 2022-05-05 LAB — TSH: TSH: 1.76 u[IU]/mL (ref 0.450–4.500)

## 2022-05-05 LAB — VITAMIN D 25 HYDROXY (VIT D DEFICIENCY, FRACTURES): Vit D, 25-Hydroxy: 61.7 ng/mL (ref 30.0–100.0)

## 2022-06-15 ENCOUNTER — Ambulatory Visit (INDEPENDENT_AMBULATORY_CARE_PROVIDER_SITE_OTHER): Payer: Medicare Other | Admitting: Podiatry

## 2022-06-15 ENCOUNTER — Encounter: Payer: Self-pay | Admitting: Podiatry

## 2022-06-15 DIAGNOSIS — B351 Tinea unguium: Secondary | ICD-10-CM | POA: Diagnosis not present

## 2022-06-15 DIAGNOSIS — M79674 Pain in right toe(s): Secondary | ICD-10-CM

## 2022-06-15 DIAGNOSIS — M79675 Pain in left toe(s): Secondary | ICD-10-CM

## 2022-06-19 NOTE — Progress Notes (Signed)
  Subjective:  Patient ID: Brittany Mahoney, female    DOB: Aug 24, 1935,  MRN: [id number]  Brittany Mahoney presents to clinic today for:  Chief Complaint  Patient presents with   Nail Problem    Routine foot care PCP-Patel,[place] PCP [facility]-05/04/2022    New problem(s): None.   PCP is Lindell Spar, MD , and last visit was  May 04, 2022.  Allergies  Allergen Reactions   Codeine    Erythromycin    Morphine And Related     loopy   Penicillins Hives and Swelling    Has patient had a PCN reaction causing immediate rash, facial/tongue/throat swelling, SOB or lightheadedness with hypotension: Yes Has patient had a PCN reaction causing severe rash involving mucus membranes or skin necrosis: No Has patient had a PCN reaction that required hospitalization No Has patient had a PCN reaction occurring within the last 10 years: No  If all of the above answers are "NO", then may proceed with Cephalosporin use.     Review of Systems: Negative except as noted in the HPI.  Objective: No changes noted in today's physical examination.  Brittany Mahoney is a pleasant 87 y.o. female WD, WN in NAD. AAO x 3. Vascular Examination: CFT <3 seconds b/l LE. Faintly palpable DP pulses b/l LE. Faintly palpable PT pulse(s) b/l LE. Pedal hair sparse. No edema noted b/l LE. No ischemia or gangrene noted b/l LE. No cyanosis or clubbing noted b/l LE.  Neurological Examination: Sensation grossly intact b/l with 10 gram monofilament. Vibratory sensation intact b/l.  Dermatological Examination: Pedal skin thin and atrophic b/l LE. No open wounds b/l LE. No interdigital macerations noted b/l LE. Toenails 1-5 b/l elongated, discolored, dystrophic, thickened, crumbly with subungual debris and tenderness to dorsal palpation. No hyperkeratosis right 2nd digit  Musculoskeletal Examination: Muscle strength 5/5 to all lower extremity muscle groups bilaterally. Pes planovalgus deformity noted b/l lower extremities. Utilizes  rollator for ambulation assistance.  Radiographs: None  Assessment/Plan: 1. Pain due to onychomycosis of toenails of both feet     No orders of the defined types were placed in this encounter.   -Patient's family member present. All questions/concerns addressed on today's visit. -Consent given for treatment as described below: -Examined patient. -Patient to continue soft, supportive shoe gear daily. -Toenails 1-5 b/l were debrided in length and girth with sterile nail nippers and dremel without iatrogenic bleeding.  -Patient/POA to call should there be question/concern in the interim.   Return in about 3 months (around 09/15/2022).  Marzetta Board, DPM

## 2022-06-26 ENCOUNTER — Ambulatory Visit: Payer: Medicare Other | Admitting: Internal Medicine

## 2022-07-31 ENCOUNTER — Ambulatory Visit (INDEPENDENT_AMBULATORY_CARE_PROVIDER_SITE_OTHER): Payer: Medicare Other

## 2022-07-31 DIAGNOSIS — Z0000 Encounter for general adult medical examination without abnormal findings: Secondary | ICD-10-CM | POA: Diagnosis not present

## 2022-07-31 NOTE — Patient Instructions (Signed)
  Ms. Row , Thank you for taking time to come for your Medicare Wellness Visit. I appreciate your ongoing commitment to your health goals. Please review the following plan we discussed and let me know if I can assist you in the future.   These are the goals we discussed:  Goals      Patient Stated     Would like to walk without the walker      Patient Stated     Patient states that her goal is to get back on her feet without using a walker     Prevent falls        This is a list of the screening recommended for you and due dates:  Health Maintenance  Topic Date Due   Zoster (Shingles) Vaccine (1 of 2) Never done   Flu Shot  04/10/2022   COVID-19 Vaccine (3 - 2023-24 season) 05/11/2022   Medicare Annual Wellness Visit  08/01/2023   Pneumonia Vaccine  Completed   DEXA scan (bone density measurement)  Completed   HPV Vaccine  Aged Out

## 2022-07-31 NOTE — Progress Notes (Signed)
Subjective:   Brittany Mahoney is a 87 y.o. female who presents for Medicare Annual (Subsequent) preventive examination. I connected with  Alonna Minium on 07/31/22 by a audio enabled telemedicine application and verified that I am speaking with the correct person using two identifiers.  Patient Location: Home  Provider Location: Office/Clinic  I discussed the limitations of evaluation and management by telemedicine. The patient expressed understanding and agreed to proceed.  Review of Systems           Objective:    There were no vitals filed for this visit. There is no height or weight on file to calculate BMI.     06/26/2021   11:31 AM 03/30/2020    1:05 PM 12/10/2019    1:56 PM 10/02/2018    8:12 AM 09/16/2018    8:19 AM 05/16/2017    9:07 AM 04/25/2017    8:57 AM  Advanced Directives  Does Patient Have a Medical Advance Directive? Yes Yes Yes Yes Yes Yes Yes  Type of Advance Directive Living will;Healthcare Power of [place];Living will [place];Living will [place];Living will [place];Living will [place];Living will  Does patient want to make changes to medical advance directive?  No - Patient declined No - Patient declined      Copy of [place] in Chart? No - copy requested No - copy requested    No - copy requested No - copy requested  Would patient like information on creating a medical advance directive? No - Patient declined          Current Medications (verified) Outpatient Encounter Medications as of 07/31/2022  Medication Sig   Calcium Carb-Cholecalciferol (CALTRATE 600+D3 PO) Take by mouth.   celecoxib (CELEBREX) 100 MG capsule Take 1 capsule (100 mg total) by mouth 2 (two) times daily as needed for mild pain or moderate pain.   Cholecalciferol 1.25 MG (50000 UT) TABS Take 5,000 Int'l Units by mouth daily.    Cranberry 500 MG CAPS Take 500 mg by mouth daily.   LUMIGAN 0.01 % SOLN INSTILL 1 DROP IN INTO BOTH EYES AT BEDTIME   NONFORMULARY OR COMPOUNDED ITEM Antifungal solution: Terbinafine 3%, Fluconazole 2%, Tea Tree Oil 5%, Urea 10%, Ibuprofen 2% in DMSO suspension #60m   Nutritional Supplements (DHEA PO) Take 15 mg by mouth every morning.   No facility-administered encounter medications on file as of 07/31/2022.    Allergies (verified) Codeine, Erythromycin, Morphine and related, and Penicillins   History: Past Medical History:  Diagnosis Date   Age-related osteoporosis without current pathological fracture    Arthritis    Arthropathy, unspecified    [place]    [place]    uterus   Chronic kidney disease, stage 1    Closed right hip fracture ([facility] 04/09/2016   [place] 03[place]   Fracture of unspecified part of neck of right femur, sequela    Glaucoma    Headache(784.0)    migraines after periods   Hepatitis    Hx of cancer of endometrium 03/06/2013   Osteoporosis    Other abnormalities of gait and mobility    Other amnesia    Pain in left knee    Pain in right foot    Pain in right shoulder    Personal history of (healed) traumatic fracture    PMB (postmenopausal bleeding)  Repeated falls    Tremor, unspecified    Past Surgical History:  Procedure Laterality Date   ABDOMINAL HYSTERECTOMY  2007   TAH and BSO   COLONOSCOPY  07/31/2006   Dr.Rourk   DILATION AND CURETTAGE OF UTERUS     HIP FRACTURE SURGERY     INTRAMEDULLARY (IM) NAIL INTERTROCHANTERIC Right 04/10/2016   Procedure: INTRAMEDULLARY (IM) NAIL RIGHT HIP FRACTURE;  Surgeon: Mcarthur Rossetti, MD;  Location: [place];  Service: Orthopedics;  Laterality: Right;   Family History  Problem Relation Age of Onset   Stroke Maternal Grandfather    Stroke Paternal Grandfather    Cancer Mother    Cancer Father        lymphatic sarcoma   Early death Sister    Early death Son     Diabetes Son    Heart disease Other    Cancer Other    Tuberculosis Other    Diabetes Other    Thyroid disease Other    Stroke Other    Heart disease Maternal Aunt    Heart disease Maternal Uncle    Social History   Socioeconomic History   Marital status: Widowed    Spouse name: Not on file   Number of children: Not on file   Years of education: Not on file   Highest education level: Not on file  Occupational History   Not on file  Tobacco Use   Smoking status: Never   Smokeless tobacco: Never  Vaping Use   Vaping Use: Never used  Substance and Sexual Activity   Alcohol use: No   Drug use: No   Sexual activity: Not Currently    Birth control/protection: Surgical  Other Topics Concern   Not on file  Social History Narrative   Widow since 2016,married for 55 years.Lives with daughter.Occupational hygienist for [employer] ,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   Social Determinants of Health   Financial Resource Strain: Low Risk  (06/26/2021)   Overall Financial Resource Strain (CARDIA)    Difficulty of Paying Living Expenses: Not hard at all  Food Insecurity: No Food Insecurity (06/26/2021)   Hunger Vital Sign    Worried About Running Out of Food in the Last Year: Never true    Ran Out of Food in the Last Year: Never true  Transportation Needs: No Transportation Needs (06/26/2021)   PRAPARE - Hydrologist (Medical): No    Lack of Transportation (Non-Medical): No  Physical Activity: Inactive (06/26/2021)   Exercise Vital Sign    Days of Exercise per Week: 0 days    Minutes of Exercise per Session: 0 min  Stress: No Stress Concern Present (06/26/2021)   [place]    Feeling of Stress : Not at all  Social Connections: Socially Isolated (06/26/2021)   Social Connection and Isolation Panel [NHANES]    Frequency of Communication with Friends and Family: More than three times a week     Frequency of Social Gatherings with Friends and Family: More than three times a week    Attends Religious Services: Never    Marine scientist or Organizations: No    Attends Archivist Meetings: Never    Marital Status: Widowed    Tobacco Counseling Counseling given: Not Answered   Clinical Intake:                 Diabetic?no  Activities of Daily Living     No data to display          Patient Care Team: Lindell Spar, MD as PCP - General (Internal Medicine)  Indicate any recent Medical Services you may have received from other than [facility] providers in the past year (date may be approximate).     Assessment:   This is a routine wellness examination for [facility].  Hearing/Vision screen No results found.  Dietary issues and exercise activities discussed:     Goals Addressed   None    Depression Screen    11/03/2021   11:02 AM 06/26/2021   11:33 AM 06/26/2021   11:29 AM 06/23/2021   11:18 AM 01/11/2021    2:48 PM 03/30/2020    1:20 PM 10/26/2019   10:18 AM  PHQ 2/9 Scores  PHQ - 2 Score 2 0 0 0 0 0 0  PHQ- 9 Score 3    0      Fall Risk    05/04/2022   10:10 AM 11/03/2021   11:02 AM 06/26/2021   11:33 AM 06/23/2021   11:18 AM 01/11/2021    2:48 PM  Fall Risk   Falls in the past year? 1 1 0 1 1  Number falls in past yr: 1 1 0 1 1  Injury with Fall? 1 0 0 1 1  Risk for fall due to : History of fall(s);Impaired balance/gait  Impaired balance/gait;Impaired mobility History of fall(s);Impaired balance/gait;Impaired mobility   Follow up Falls evaluation completed;Education provided;Falls prevention discussed  Falls evaluation completed;Education provided;Falls prevention discussed;Follow up appointment Falls evaluation completed;Education provided;Falls prevention discussed     FALL RISK PREVENTION PERTAINING TO THE HOME:  Any stairs in or around the home? No  If so, are there any without handrails? No  Home free of loose  throw rugs in walkways, pet beds, electrical cords, etc? Yes  Adequate lighting in your home to reduce risk of falls? Yes   ASSISTIVE DEVICES UTILIZED TO PREVENT FALLS:  Life alert? Yes  Use of a cane, walker or w/c? Yes  Grab bars in the bathroom? Yes  Shower chair or bench in shower? Yes  Elevated toilet seat or a handicapped toilet? Yes   TIMED UP AND GO:  Was the test performed? No .  Length of time to ambulate 10 feet:  sec.     Cognitive Function:    06/26/2021   11:34 AM 10/26/2019   11:02 AM  MMSE - Mini Mental State Exam  Not completed: Unable to complete   Orientation to time  5  Orientation to Place  5  Registration  3  Attention/ Calculation  3  Recall  2  Language- name 2 objects  2  Language- repeat  1  Language- follow 3 step command  2  Language- read & follow direction  1  Write a sentence  1  Copy design  1  Total score  26        06/26/2021   11:34 AM 03/30/2020    1:08 PM  6CIT Screen  What Year? 0 points 0 points  What month? 0 points 0 points  What time? 0 points 0 points  Count back from 20 0 points 0 points  Months in reverse 0 points 0 points  Repeat phrase 2 points 0 points  Total Score 2 points 0 points    Immunizations Immunization History  Administered Date(s) Administered   Fluad Quad(high Dose 65+) 07/11/2019, 07/13/2020, 06/23/2021  Influenza, High Dose Seasonal PF 07/02/2017, 07/19/2018   PFIZER(Purple Top)SARS-COV-2 Vaccination 12/12/2019, 01/06/2020   Pneumococcal Conjugate-13 09/14/2020   Pneumococcal Polysaccharide-23 06/14/2014    TDAP status: Due, Education has been provided regarding the importance of this vaccine. Advised may receive this vaccine at local pharmacy or Health Dept. Aware to provide a copy of the vaccination record if obtained from local pharmacy or Health Dept. Verbalized acceptance and understanding.  Flu Vaccine status: Due, Education has been provided regarding the importance of this vaccine.  Advised may receive this vaccine at local pharmacy or Health Dept. Aware to provide a copy of the vaccination record if obtained from local pharmacy or Health Dept. Verbalized acceptance and understanding.  Pneumococcal vaccine status: Up to date  Covid-19 vaccine status: Information provided on how to obtain vaccines.   Qualifies for Shingles Vaccine? Yes   Zostavax completed No   Shingrix Completed?: No.    Education has been provided regarding the importance of this vaccine. Patient has been advised to call insurance company to determine out of pocket expense if they have not yet received this vaccine. Advised may also receive vaccine at local pharmacy or Health Dept. Verbalized acceptance and understanding.  Screening Tests Health Maintenance  Topic Date Due   Zoster Vaccines- Shingrix (1 of 2) Never done   INFLUENZA VACCINE  04/10/2022   COVID-19 Vaccine (3 - 2023-24 season) 05/11/2022   Medicare Annual Wellness (AWV)  06/26/2022   Pneumonia Vaccine 72+ Years old  Completed   DEXA SCAN  Completed   HPV VACCINES  Aged Out    Health Maintenance  Health Maintenance Due  Topic Date Due   Zoster Vaccines- Shingrix (1 of 2) Never done   INFLUENZA VACCINE  04/10/2022   COVID-19 Vaccine (3 - 2023-24 season) 05/11/2022   Medicare Annual Wellness (AWV)  06/26/2022    Colorectal cancer screening: No longer required.   Mammogram status: No longer required due to age.  Bone Density status: Completed 06/16/20. Results reflect: Bone density results: OSTEOPOROSIS. Repeat every   years.  Lung Cancer Screening: (Low Dose CT Chest recommended if Age 46-80 years, 30 pack-year currently smoking OR have quit w/in 15years.) does not qualify.   Lung Cancer Screening Referral:   Additional Screening:  Hepatitis C Screening: does not qualify; Completed   Vision Screening: Recommended annual ophthalmology exams for early detection of glaucoma and other disorders of the eye. Is the patient up  to date with their annual eye exam?  Yes  Who is the provider or what is the name of the office in which the patient attends annual eye exams?  [facility] If pt is not established with a provider, would they like to be referred to a provider to establish care? No .   Dental Screening: Recommended annual dental exams for proper oral hygiene  Community Resource Referral / Chronic Care Management: CRR required this visit?  No   CCM required this visit?  No      Plan:     I have personally reviewed and noted the following in the patient's chart:   Medical and social history Use of alcohol, tobacco or illicit drugs  Current medications and supplements including opioid prescriptions. Patient is not currently taking opioid prescriptions. Functional ability and status Nutritional status Physical activity Advanced directives List of other physicians Hospitalizations, surgeries, and ER visits in previous 12 months Vitals Screenings to include cognitive, depression, and falls Referrals and appointments  In addition, I have reviewed and discussed  with patient certain preventive protocols, quality metrics, and best practice recommendations. A written personalized care plan for preventive services as well as general preventive health recommendations were provided to patient.     Jill Side, [place]   07/31/2022   Nurse Notes:

## 2022-08-17 ENCOUNTER — Encounter: Payer: Self-pay | Admitting: Podiatry

## 2022-08-17 ENCOUNTER — Ambulatory Visit (INDEPENDENT_AMBULATORY_CARE_PROVIDER_SITE_OTHER): Payer: Medicare Other | Admitting: Podiatry

## 2022-08-17 VITALS — BP 142/76

## 2022-08-17 DIAGNOSIS — B351 Tinea unguium: Secondary | ICD-10-CM

## 2022-08-17 DIAGNOSIS — M79675 Pain in left toe(s): Secondary | ICD-10-CM

## 2022-08-17 DIAGNOSIS — M79674 Pain in right toe(s): Secondary | ICD-10-CM

## 2022-08-17 NOTE — Progress Notes (Signed)
  Subjective:  Patient ID: Brittany Mahoney, female    DOB: 1935-05-02,  MRN: [id number]  Brittany Mahoney presents to clinic today for {jgcomplaint:23593}  Chief Complaint  Patient presents with   Foot Problem    RFC    New problem(s): None. {jgcomplaint:23593}  PCP is Lindell Spar, MD.  Allergies  Allergen Reactions   Codeine    Erythromycin    Morphine And Related     loopy   Penicillins Hives and Swelling    Has patient had a PCN reaction causing immediate rash, facial/tongue/throat swelling, SOB or lightheadedness with hypotension: Yes Has patient had a PCN reaction causing severe rash involving mucus membranes or skin necrosis: No Has patient had a PCN reaction that required hospitalization No Has patient had a PCN reaction occurring within the last 10 years: No  If all of the above answers are "NO", then may proceed with Cephalosporin use.     Review of Systems: Negative except as noted in the HPI.  Objective: No changes noted in today's physical examination. Vitals:   08/17/22 0838  BP: (!) 142/76   Brittany Mahoney is a pleasant 87 y.o. female {jgbodyhabitus:24098} AAO x 3. Vascular Examination: CFT <3 seconds b/l LE. Faintly palpable DP pulses b/l LE. Faintly palpable PT pulse(s) b/l LE. Pedal hair sparse. No edema noted b/l LE. No ischemia or gangrene noted b/l LE. No cyanosis or clubbing noted b/l LE.  Neurological Examination: Sensation grossly intact b/l with 10 gram monofilament. Vibratory sensation intact b/l.  Dermatological Examination: Pedal skin thin and atrophic b/l LE. No open wounds b/l LE. No interdigital macerations noted b/l LE. Toenails 1-5 b/l elongated, discolored, dystrophic, thickened, crumbly with subungual debris and tenderness to dorsal palpation. No hyperkeratosis right 2nd digit.  Musculoskeletal Examination: Muscle strength 5/5 to all lower extremity muscle groups bilaterally. Pes planovalgus deformity noted b/l lower extremities. Utilizes  rollator for ambulation assistance.  Radiographs: None Assessment/Plan: 1. Pain due to onychomycosis of toenails of both feet     No orders of the defined types were placed in this encounter.   None {Jgplan:23602::"-Patient/POA to call should there be question/concern in the interim."}   Return in about 3 months (around 11/16/2022).  Marzetta Board, DPM

## 2022-08-23 DIAGNOSIS — H04123 Dry eye syndrome of bilateral lacrimal glands: Secondary | ICD-10-CM | POA: Diagnosis not present

## 2022-10-26 ENCOUNTER — Ambulatory Visit (INDEPENDENT_AMBULATORY_CARE_PROVIDER_SITE_OTHER): Payer: Medicare Other | Admitting: Podiatry

## 2022-10-26 ENCOUNTER — Encounter: Payer: Self-pay | Admitting: Podiatry

## 2022-10-26 VITALS — BP 119/72

## 2022-10-26 DIAGNOSIS — B351 Tinea unguium: Secondary | ICD-10-CM

## 2022-10-26 DIAGNOSIS — M79675 Pain in left toe(s): Secondary | ICD-10-CM | POA: Diagnosis not present

## 2022-10-26 DIAGNOSIS — M79674 Pain in right toe(s): Secondary | ICD-10-CM | POA: Diagnosis not present

## 2022-10-26 NOTE — Progress Notes (Signed)
  Subjective:  Patient ID: Brittany Mahoney, female    DOB: May 21, 1935,  MRN: [id number]  Brittany Mahoney presents to clinic today for painful, discolored, thick toenails which interfere with daily activities  Chief Complaint  Patient presents with   Nail Problem    RFC PCP-[facility] PCP VST-04/2022   New problem(s): None.   PCP is Lindell Spar, MD.  Allergies  Allergen Reactions   Codeine    Erythromycin    Morphine And Related     loopy   Penicillins Hives and Swelling    Has patient had a PCN reaction causing immediate rash, facial/tongue/throat swelling, SOB or lightheadedness with hypotension: Yes Has patient had a PCN reaction causing severe rash involving mucus membranes or skin necrosis: No Has patient had a PCN reaction that required hospitalization No Has patient had a PCN reaction occurring within the last 10 years: No  If all of the above answers are "NO", then may proceed with Cephalosporin use.     Review of Systems: Negative except as noted in the HPI.  Objective: No changes noted in today's physical examination. Vitals:   10/26/22 0816  BP: 119/72   Brittany Mahoney is a pleasant 88 y.o. female WD, WN in NAD. AAO x 3.  Vascular Examination: CFT <3 seconds b/l LE. Faintly palpable DP pulses b/l LE. Faintly palpable PT pulse(s) b/l LE. Pedal hair sparse. No edema noted b/l LE. No ischemia or gangrene noted b/l LE. No cyanosis or clubbing noted b/l LE.  Neurological Examination: Sensation grossly intact b/l with 10 gram monofilament. Vibratory sensation intact b/l.  Dermatological Examination: Pedal skin thin and atrophic b/l LE. No open wounds b/l LE. No interdigital macerations noted b/l LE. Toenails 1-5 b/l elongated, discolored, dystrophic, thickened, crumbly with subungual debris and tenderness to dorsal palpation. No hyperkeratosis right 2nd digit.  Musculoskeletal Examination: Muscle strength 5/5 to all lower extremity muscle groups bilaterally. Pes  planovalgus deformity noted b/l lower extremities. Utilizes rollator for ambulation assistance.She is wearing new slippers with leather sole and soft interior.  Radiographs: None  Assessment/Plan: 1. Pain due to onychomycosis of toenails of both feet     -Patient's family member present. All questions/concerns addressed on today's visit. -Patient to continue soft, supportive shoe gear daily. -Toenails 1-5 b/l were debrided in length and girth with sterile nail nippers and dremel without iatrogenic bleeding.  -Patient/POA to call should there be question/concern in the interim.   Return in about 9 weeks (around 12/28/2022).  Marzetta Board, DPM

## 2022-11-05 ENCOUNTER — Ambulatory Visit (INDEPENDENT_AMBULATORY_CARE_PROVIDER_SITE_OTHER): Payer: Medicare Other | Admitting: Internal Medicine

## 2022-11-05 ENCOUNTER — Encounter: Payer: Self-pay | Admitting: Internal Medicine

## 2022-11-05 VITALS — BP 132/79 | HR 103 | Ht 66.0 in | Wt 155.6 lb

## 2022-11-05 DIAGNOSIS — R296 Repeated falls: Secondary | ICD-10-CM

## 2022-11-05 DIAGNOSIS — M810 Age-related osteoporosis without current pathological fracture: Secondary | ICD-10-CM | POA: Diagnosis not present

## 2022-11-05 DIAGNOSIS — G309 Alzheimer's disease, unspecified: Secondary | ICD-10-CM

## 2022-11-05 DIAGNOSIS — F320 Major depressive disorder, single episode, mild: Secondary | ICD-10-CM | POA: Diagnosis not present

## 2022-11-05 DIAGNOSIS — F02B18 Dementia in other diseases classified elsewhere, moderate, with other behavioral disturbance: Secondary | ICD-10-CM

## 2022-11-05 DIAGNOSIS — R7989 Other specified abnormal findings of blood chemistry: Secondary | ICD-10-CM | POA: Diagnosis not present

## 2022-11-05 NOTE — Progress Notes (Signed)
Established Patient Office Visit  Subjective:  Patient ID: Brittany Mahoney, female    DOB: September 01, 1935  Age: 88 y.o. MRN: [id number]  CC:  Chief Complaint  Patient presents with   Osteoporosis    Follow up    HPI Brittany Mahoney is a 88 y.o. female with past medical history of osteoporosis, OA of knee, endometrial cancer s/p TAH, dementia and recurrent falls who presents for f/u of her chronic medical conditions. Her daughter is present during the visit today.  She takes Caltrate for osteoporosis. She still does not want PT. Denies any recent fall since the last visit.  She stays by her self during the daytime from 7 AM to 6 PM.  She feels down at times, but denies feeling depressed.  She feels lonely, and states that she tries to stay active with her cat.  She does play puzzles at times.  She sleeps about 4-5 hours at nighttime, but takes at least 2 naps during the day, lasting about 45 mins. Denies any SI of HI currently.  She has history of severe episode of depression in the past.    Past Medical History:  Diagnosis Date   Age-related osteoporosis without current pathological fracture    Arthritis    Arthropathy, unspecified    Broken hip ([place])    Cancer ([place])    uterus   Chronic kidney disease, stage 1    Closed right hip fracture ([place]) 04/09/2016   Endometrial cancer, grade I ([place]) 03/21/2015   Fracture of unspecified part of neck of right femur, sequela    Glaucoma    Headache(784.0)    migraines after periods   Hepatitis    Hx of cancer of endometrium 03/06/2013   Osteoporosis    Other abnormalities of gait and mobility    Other amnesia    Pain in left knee    Pain in right foot    Pain in right shoulder    Personal history of (healed) traumatic fracture    PMB (postmenopausal bleeding)    Repeated falls    Tremor, unspecified     Past Surgical History:  Procedure Laterality Date   ABDOMINAL HYSTERECTOMY  2007   TAH and BSO   COLONOSCOPY  07/31/2006   Dr.Rourk    DILATION AND CURETTAGE OF UTERUS     HIP FRACTURE SURGERY     INTRAMEDULLARY (IM) NAIL INTERTROCHANTERIC Right 04/10/2016   Procedure: INTRAMEDULLARY (IM) NAIL RIGHT HIP FRACTURE;  Surgeon: Mcarthur Rossetti, MD;  Location: [facility];  Service: Orthopedics;  Laterality: Right;    Family History  Problem Relation Age of Onset   Stroke Maternal Grandfather    Stroke Paternal Grandfather    Cancer Mother    Cancer Father        lymphatic sarcoma   Early death Sister    Early death Son    Diabetes Son    Heart disease Other    Cancer Other    Tuberculosis Other    Diabetes Other    Thyroid disease Other    Stroke Other    Heart disease Maternal Aunt    Heart disease Maternal Uncle     Social History   Socioeconomic History   Marital status: Widowed    Spouse name: Not on file   Number of children: Not on file   Years of education: Not on file   Highest education level: Not on file  Occupational History   Not on file  Tobacco  Use   Smoking status: Never   Smokeless tobacco: Never  Vaping Use   Vaping Use: Never used  Substance and Sexual Activity   Alcohol use: No   Drug use: No   Sexual activity: Not Currently    Birth control/protection: Surgical  Other Topics Concern   Not on file  Social History Narrative   Widow since 2016,married for 55 years.Lives with daughter.Occupational hygienist for [employer] ,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   Social Determinants of Health   Financial Resource Strain: Low Risk  (07/31/2022)   Overall Financial Resource Strain (CARDIA)    Difficulty of Paying Living Expenses: Not hard at all  Food Insecurity: No Food Insecurity (07/31/2022)   Hunger Vital Sign    Worried About Running Out of Food in the Last Year: Never true    Ran Out of Food in the Last Year: Never true  Transportation Needs: No Transportation Needs (07/31/2022)   PRAPARE - Hydrologist (Medical): No    Lack of Transportation  (Non-Medical): No  Physical Activity: Inactive (06/26/2021)   Exercise Vital Sign    Days of Exercise per Week: 0 days    Minutes of Exercise per Session: 0 min  Stress: No Stress Concern Present (07/31/2022)   [place]    Feeling of Stress : Not at all  Social Connections: Socially Isolated (07/31/2022)   Social Connection and Isolation Panel [NHANES]    Frequency of Communication with Friends and Family: More than three times a week    Frequency of Social Gatherings with Friends and Family: More than three times a week    Attends Religious Services: Never    Marine scientist or Organizations: No    Attends Archivist Meetings: Never    Marital Status: Widowed  Intimate Partner Violence: Not At Risk (06/26/2021)   Humiliation, Afraid, Rape, and Kick questionnaire    Fear of Current or Ex-Partner: No    Emotionally Abused: No    Physically Abused: No    Sexually Abused: No    Outpatient Medications Prior to Visit  Medication Sig Dispense Refill   Calcium Carb-Cholecalciferol (CALTRATE 600+D3 PO) Take by mouth.     celecoxib (CELEBREX) 100 MG capsule Take 1 capsule (100 mg total) by mouth 2 (two) times daily as needed for mild pain or moderate pain. 30 capsule 2   Cholecalciferol 1.25 MG (50000 UT) TABS Take 5,000 Int'l Units by mouth daily.     Cranberry 500 MG CAPS Take 500 mg by mouth daily.     LUMIGAN 0.01 % SOLN INSTILL 1 DROP IN INTO BOTH EYES AT BEDTIME  6   NONFORMULARY OR COMPOUNDED ITEM Antifungal solution: Terbinafine 3%, Fluconazole 2%, Tea Tree Oil 5%, Urea 10%, Ibuprofen 2% in DMSO suspension #8m 1 each 3   Nutritional Supplements (DHEA PO) Take 15 mg by mouth every morning.     No facility-administered medications prior to visit.    Allergies  Allergen Reactions   Codeine    Erythromycin    Morphine And Related     loopy   Penicillins Hives and Swelling    Has patient had a  PCN reaction causing immediate rash, facial/tongue/throat swelling, SOB or lightheadedness with hypotension: Yes Has patient had a PCN reaction causing severe rash involving mucus membranes or skin necrosis: No Has patient had a PCN reaction that required hospitalization No Has patient had a PCN reaction occurring within  the last 10 years: No  If all of the above answers are "NO", then may proceed with Cephalosporin use.     ROS Review of Systems  Constitutional:  Negative for chills and fever.  HENT:  Negative for congestion, sinus pressure, sinus pain and sore throat.   Eyes:  Negative for pain and discharge.  Respiratory:  Negative for cough and shortness of breath.   Cardiovascular:  Negative for chest pain and palpitations.  Gastrointestinal:  Negative for abdominal pain, diarrhea, nausea and vomiting.  Endocrine: Negative for polydipsia and polyuria.  Genitourinary:  Negative for dysuria and hematuria.  Musculoskeletal:  Positive for arthralgias, back pain and gait problem. Negative for neck pain and neck stiffness.  Skin:  Negative for rash.  Neurological:  Positive for tremors. Negative for dizziness and weakness.  Psychiatric/Behavioral:  Positive for dysphoric mood and sleep disturbance. Negative for agitation and behavioral problems.       Objective:    Physical Exam Vitals reviewed.  Constitutional:      General: She is not in acute distress.    Appearance: She is not diaphoretic.  HENT:     Head: Normocephalic and atraumatic.     Nose: Nose normal.     Mouth/Throat:     Mouth: Mucous membranes are moist.  Eyes:     General: No scleral icterus.    Extraocular Movements: Extraocular movements intact.  Cardiovascular:     Rate and Rhythm: Normal rate and regular rhythm.     Pulses: Normal pulses.     Heart sounds: Normal heart sounds. No murmur heard. Pulmonary:     Breath sounds: Normal breath sounds. No wheezing or rales.  Musculoskeletal:        General:  Tenderness (B/l hips) present.     Cervical back: Neck supple. No tenderness.     Right lower leg: No edema.     Left lower leg: No edema.  Skin:    General: Skin is warm.     Comments: Livedo reticularis over b/l legs, chronic  Neurological:     General: No focal deficit present.     Mental Status: She is alert and oriented to person, place, and time. Mental status is at baseline.     Motor: Weakness (3/5 in b/l LE, 2/5 in b/l LE) present.  Psychiatric:        Mood and Affect: Mood normal.        Behavior: Behavior normal.     BP 132/79 (BP Location: Left Arm, Patient Position: Sitting, Cuff Size: Normal)   Pulse (!) 103   Ht '5\' 6"'$  (1.676 m)   Wt 155 lb 9.6 oz (70.6 kg)   SpO2 92%   BMI 25.11 kg/m  Wt Readings from Last 3 Encounters:  11/05/22 155 lb 9.6 oz (70.6 kg)  05/04/22 145 lb 9.6 oz (66 kg)  11/03/21 145 lb 9.6 oz (66 kg)    Lab Results  Component Value Date   TSH 1.760 05/04/2022   Lab Results  Component Value Date   WBC 9.0 05/04/2022   HGB 14.7 05/04/2022   HCT 41.7 05/04/2022   MCV 91 05/04/2022   PLT 319 05/04/2022   Lab Results  Component Value Date   NA 141 05/04/2022   K 4.5 05/04/2022   CO2 24 05/04/2022   GLUCOSE 80 05/04/2022   BUN 12 05/04/2022   CREATININE 0.99 05/04/2022   BILITOT 1.1 09/14/2020   ALKPHOS 88 04/20/2019   AST 13 09/14/2020  ALT 14 09/14/2020   PROT 7.4 09/14/2020   ALBUMIN 4.1 04/20/2019   CALCIUM 10.2 05/04/2022   ANIONGAP 7 08/15/2016   EGFR 56 (L) 05/04/2022   Lab Results  Component Value Date   CHOL 125 03/09/2020   Lab Results  Component Value Date   HDL 40 (L) 03/09/2020   Lab Results  Component Value Date   LDLCALC 70 03/09/2020   Lab Results  Component Value Date   TRIG 74 03/09/2020   Lab Results  Component Value Date   CHOLHDL 3.1 03/09/2020   No results found for: "HGBA1C"    Assessment & Plan:   Problem List Items Addressed This Visit       Nervous and Auditory   Dementia ([place])     Mostly short-term memory issues with mild agitation at times Independent with ADLs Prefers to not take any medication for now Lives with daughter, who helps with IADLs.        Musculoskeletal and Integument   Osteoporosis - Primary    Last DEXA scan reviewed Was on Fosamax in the past Currently on Caltrate plus vitamin D  Stopped HRT with estradiol and progesterone        Other   Repeated falls    Has had multiple falls due to gait problem She is able to walk with rolling walker, at her baseline Uses walker at home Advised for proper hydration and to avoid skipping any meal Avoid sudden positional changes      Current mild episode of major depressive disorder ([place])    She feels mostly lonely rather than depressed currently Prefers to avoid any medicine for now Advised to take Melatonin for sleep-wake cycle regulation and avoid multiple daytime naps      Elevated serum creatinine    Last BMP reviewed Elevated Cr. likely due to dehydration Needs to improve fluid intake Check urine microalbumin/creatinine ratio      Relevant Orders   Basic Metabolic Panel (BMET)   Urine Microalbumin w/creat. ratio   No orders of the defined types were placed in this encounter.   Follow-up: Return in about 8 months (around 07/06/2023) for OA.    Lindell Spar, MD

## 2022-11-05 NOTE — Assessment & Plan Note (Signed)
Mostly short-term memory issues with mild agitation at times Independent with ADLs Prefers to not take any medication for now Lives with daughter, who helps with IADLs.

## 2022-11-05 NOTE — Assessment & Plan Note (Signed)
She feels mostly lonely rather than depressed currently Prefers to avoid any medicine for now Advised to take Melatonin for sleep-wake cycle regulation and avoid multiple daytime naps

## 2022-11-05 NOTE — Patient Instructions (Signed)
Please continue taking supplements.  Please maintain at least 50 ounces of fluid in a day.

## 2022-11-05 NOTE — Assessment & Plan Note (Signed)
Has had multiple falls due to gait problem She is able to walk with rolling walker, at her baseline Uses walker at home Advised for proper hydration and to avoid skipping any meal Avoid sudden positional changes

## 2022-11-05 NOTE — Assessment & Plan Note (Signed)
Last DEXA scan reviewed Was on Fosamax in the past Currently on Caltrate plus vitamin D  Stopped HRT with estradiol and progesterone

## 2022-11-05 NOTE — Assessment & Plan Note (Addendum)
Last BMP reviewed Elevated Cr. likely due to dehydration Needs to improve fluid intake Check urine microalbumin/creatinine ratio

## 2022-11-07 LAB — BASIC METABOLIC PANEL
BUN/Creatinine Ratio: 13 (ref 12–28)
BUN: 13 mg/dL (ref 8–27)
CO2: 26 mmol/L (ref 20–29)
Calcium: 10.2 mg/dL (ref 8.7–10.3)
Chloride: 104 mmol/L (ref 96–106)
Creatinine, Ser: 1.03 mg/dL — ABNORMAL HIGH (ref 0.57–1.00)
Glucose: 105 mg/dL — ABNORMAL HIGH (ref 70–99)
Potassium: 4.4 mmol/L (ref 3.5–5.2)
Sodium: 144 mmol/L (ref 134–144)
eGFR: 53 mL/min/{1.73_m2} — ABNORMAL LOW (ref >59)

## 2022-11-07 LAB — MICROALBUMIN / CREATININE URINE RATIO
Creatinine, Urine: 125.9 mg/dL
Microalb/Creat Ratio: 10 mg/g creat (ref 0–29)
Microalbumin, Urine: 12.3 ug/mL

## 2022-12-28 ENCOUNTER — Ambulatory Visit (INDEPENDENT_AMBULATORY_CARE_PROVIDER_SITE_OTHER): Payer: Medicare Other | Admitting: Podiatry

## 2022-12-28 ENCOUNTER — Encounter: Payer: Self-pay | Admitting: Podiatry

## 2022-12-28 VITALS — BP 139/77

## 2022-12-28 DIAGNOSIS — B351 Tinea unguium: Secondary | ICD-10-CM

## 2022-12-28 DIAGNOSIS — M79675 Pain in left toe(s): Secondary | ICD-10-CM

## 2022-12-28 DIAGNOSIS — M79674 Pain in right toe(s): Secondary | ICD-10-CM | POA: Diagnosis not present

## 2022-12-28 NOTE — Progress Notes (Unsigned)
  Subjective:  Patient ID: Brittany Mahoney, female    DOB: [birth year],  MRN: [id number]  Brittany Mahoney presents to clinic today for {jgcomplaint:23593}  Chief Complaint  Patient presents with   Nail Problem    RFC PCP-[facility] PCP VST-10/2022   New problem(s): None. {jgcomplaint:23593}  PCP is Anabel Halon, MD.  Allergies  Allergen Reactions   Codeine    Erythromycin    Morphine And Related     loopy   Penicillins Hives and Swelling    Has patient had a PCN reaction causing immediate rash, facial/tongue/throat swelling, SOB or lightheadedness with hypotension: Yes Has patient had a PCN reaction causing severe rash involving mucus membranes or skin necrosis: No Has patient had a PCN reaction that required hospitalization No Has patient had a PCN reaction occurring within the last 10 years: No  If all of the above answers are "NO", then may proceed with Cephalosporin use.     Review of Systems: Negative except as noted in the HPI.  Objective: No changes noted in today's physical examination.  Vitals:   12/28/22 0841  BP: 139/77   Brittany Mahoney is a pleasant 88 y.o. female WD, WN in NAD. AAO x 3.  Vascular Examination: CFT <3 seconds b/l LE. Faintly palpable DP pulses b/l LE. Faintly palpable PT pulse(s) b/l LE. Pedal hair sparse. No edema noted b/l LE. No ischemia or gangrene noted b/l LE. No cyanosis or clubbing noted b/l LE.  Neurological Examination: Sensation grossly intact b/l with 10 gram monofilament. Vibratory sensation intact b/l.  Dermatological Examination: Pedal skin thin and atrophic b/l LE. No open wounds b/l LE. No interdigital macerations noted b/l LE. Toenails 1-5 b/l elongated, discolored, dystrophic, thickened, crumbly with subungual debris and tenderness to dorsal palpation. No hyperkeratosis right 2nd digit.  Musculoskeletal Examination: Muscle strength 5/5 to all lower extremity muscle groups bilaterally. Pes planovalgus deformity noted b/l lower  extremities. Utilizes rollator for ambulation assistance.She is wearing new slippers with leather sole and soft interior.  Radiographs: None  Assessment/Plan: 1. Pain due to onychomycosis of toenails of both feet     No orders of the defined types were placed in this encounter.   None {Jgplan:23602::"-Patient/POA to call should there be question/concern in the interim."}   No follow-ups on file.  Freddie Breech, DPM

## 2023-01-09 ENCOUNTER — Encounter: Payer: Self-pay | Admitting: Podiatry

## 2023-02-28 DIAGNOSIS — H401132 Primary open-angle glaucoma, bilateral, moderate stage: Secondary | ICD-10-CM | POA: Diagnosis not present

## 2023-03-01 ENCOUNTER — Ambulatory Visit: Payer: Medicare Other | Admitting: Podiatry

## 2023-03-05 ENCOUNTER — Encounter: Payer: Self-pay | Admitting: Podiatry

## 2023-03-05 ENCOUNTER — Ambulatory Visit (INDEPENDENT_AMBULATORY_CARE_PROVIDER_SITE_OTHER): Payer: Medicare Other | Admitting: Podiatry

## 2023-03-05 VITALS — BP 130/81 | HR 93

## 2023-03-05 DIAGNOSIS — B351 Tinea unguium: Secondary | ICD-10-CM | POA: Diagnosis not present

## 2023-03-05 DIAGNOSIS — M79674 Pain in right toe(s): Secondary | ICD-10-CM | POA: Diagnosis not present

## 2023-03-05 DIAGNOSIS — M79675 Pain in left toe(s): Secondary | ICD-10-CM

## 2023-03-05 NOTE — Progress Notes (Signed)
  Subjective:  Patient ID: Brittany Mahoney, female    DOB: 1935-04-03,  MRN: [id number]  Brittany Mahoney presents to clinic today for: painful thick toenails that are difficult to trim. Pain interferes with ambulation. Aggravating factors include wearing enclosed shoe gear. Pain is relieved with periodic professional debridement.  Chief Complaint  Patient presents with   Nail Problem    "Cut my toenail."   She is accompanied by her daughter on today's visit.  PCP is Anabel Halon, MD.  Allergies  Allergen Reactions   Codeine    Erythromycin    Morphine And Codeine     loopy   Penicillins Hives and Swelling    Has patient had a PCN reaction causing immediate rash, facial/tongue/throat swelling, SOB or lightheadedness with hypotension: Yes Has patient had a PCN reaction causing severe rash involving mucus membranes or skin necrosis: No Has patient had a PCN reaction that required hospitalization No Has patient had a PCN reaction occurring within the last 10 years: No  If all of the above answers are "NO", then may proceed with Cephalosporin use.     Review of Systems: Negative except as noted in the HPI.  Objective: No changes noted in today's physical examination. Vitals:   03/05/23 0826  BP: 130/81  Pulse: 93    Brittany Mahoney is a pleasant 88 y.o. female in NAD. AAO x 3.  Vascular Examination: Capillary refill time <3 seconds b/l LE. Pedal pulses are faintly palpable b/l LE. Digital hair decreased b/l. No pedal edema b/l. Skin temperature gradient WNL b/l. No varicosities b/l. No cyanosis or clubbing noted b/l LE.Marland Kitchen  Dermatological Examination: Pedal skin with normal turgor, texture and tone b/l. No open wounds. No interdigital macerations b/l. Toenails 1-5 b/l thickened, discolored, dystrophic with subungual debris. There is pain on palpation to dorsal aspect of nailplates.   No hyperkeratotic nor porokeratotic lesions present on today's visit.  Neurological  Examination: Protective sensation intact with 10 gram monofilament b/l LE. Vibratory sensation intact b/l LE.   Musculoskeletal Examination: Muscle strength 5/5 to all lower extremity muscle groups bilaterally. Pes planovalgus deformity noted b/l lower extremities. Utilizes rollator for ambulation assistance.  Assessment/Plan: 1. Pain due to onychomycosis of toenails of both feet   -Patient was evaluated and treated. All patient's and/or POA's questions/concerns answered on today's visit. -Patient to continue soft, supportive shoe gear daily. -Toenails 1-5 bilaterally were debrided in length and girth with sterile nail nippers and dremel. Pinpoint bleeding of right great toe addressed with Lumicain Hemostatic Solution, cleansed with alcohol. Triple antibiotic ointment applied. Patient/careigver instructed to apply Neosporin Cream once daily for 7 days. -Patient/POA to call should there be question/concern in the interim.   Return in about 9 weeks (around 05/07/2023).  Freddie Breech, DPM

## 2023-04-26 ENCOUNTER — Telehealth: Payer: Self-pay | Admitting: Internal Medicine

## 2023-04-26 NOTE — Telephone Encounter (Signed)
Placard Copied Noted Sleeved (put in provider box)  Can call patient or send mychart messge when ready

## 2023-04-30 NOTE — Telephone Encounter (Signed)
Called to inform pt that paperwork was done- says she will come by on Friday 8/23 to pick it up

## 2023-05-08 ENCOUNTER — Ambulatory Visit: Payer: Medicare Other | Admitting: Podiatry

## 2023-05-08 ENCOUNTER — Ambulatory Visit (INDEPENDENT_AMBULATORY_CARE_PROVIDER_SITE_OTHER): Payer: Medicare Other | Admitting: Podiatry

## 2023-05-08 DIAGNOSIS — M79675 Pain in left toe(s): Secondary | ICD-10-CM

## 2023-05-08 DIAGNOSIS — B351 Tinea unguium: Secondary | ICD-10-CM

## 2023-05-08 DIAGNOSIS — M79674 Pain in right toe(s): Secondary | ICD-10-CM

## 2023-05-08 NOTE — Progress Notes (Signed)
  Subjective:  Patient ID: Brittany Mahoney, female    DOB: 04/18/1935,  MRN: [id number]  Brittany Mahoney presents to clinic today for painful elongated mycotic toenails 1-5 bilaterally which are tender when wearing enclosed shoe gear. Pain is relieved with periodic professional debridement.  Chief Complaint  Patient presents with   Nail Problem    RFC,Referring Provider Anabel Halon, MD,lov:02/24      New problem(s): None.   PCP is Anabel Halon, MD.  Allergies  Allergen Reactions   Codeine    Erythromycin    Morphine And Codeine     loopy   Penicillins Hives and Swelling    Has patient had a PCN reaction causing immediate rash, facial/tongue/throat swelling, SOB or lightheadedness with hypotension: Yes Has patient had a PCN reaction causing severe rash involving mucus membranes or skin necrosis: No Has patient had a PCN reaction that required hospitalization No Has patient had a PCN reaction occurring within the last 10 years: No  If all of the above answers are "NO", then may proceed with Cephalosporin use.     Review of Systems: Negative except as noted in the HPI.  Objective: No changes noted in today's physical examination. There were no vitals filed for this visit. Brittany Mahoney is a pleasant 88 y.o. female WD, WN in NAD. AAO x 3.  Vascular Examination: CFT <3 seconds b/l. DP/PT pulses faintly palpable b/l. Skin temperature gradient warm to warm b/l. No pain with calf compression. No ischemia or gangrene. No cyanosis or clubbing noted b/l. Pedal hair sparse.   Neurological Examination: Sensation grossly intact b/l with 10 gram monofilament. Vibratory sensation intact b/l.   Dermatological Examination: Pedal skin warm and supple b/l.   No open wounds. No interdigital macerations.  Toenails 1-5 b/l thick, discolored, elongated with subungual debris and pain on dorsal palpation.    No corns, calluses nor porokeratotic lesions noted.  Musculoskeletal  Examination: Muscle strength 5/5 to all lower extremity muscle groups bilaterally. Pes planovalgus deformity noted b/l lower extremities. Utilizes walker for ambulation assistance.  Radiographs: None  Assessment/Plan: 1. Pain due to onychomycosis of toenails of both feet     -Patient's family member present. All questions/concerns addressed on today's visit. -No new findings. No new orders. -Patient to continue soft, supportive shoe gear daily. -Mycotic toenails 1-5 bilaterally were debrided in length and girth with sterile nail nippers and dremel without incident. -Patient/POA to call should there be question/concern in the interim.   Return in about 9 weeks (around 07/10/2023).  Freddie Breech, DPM

## 2023-05-11 ENCOUNTER — Encounter: Payer: Self-pay | Admitting: Podiatry

## 2023-07-12 ENCOUNTER — Encounter: Payer: Self-pay | Admitting: Internal Medicine

## 2023-07-12 ENCOUNTER — Ambulatory Visit: Payer: Medicare Other | Admitting: Internal Medicine

## 2023-07-12 VITALS — BP 122/80 | HR 101 | Resp 16 | Ht 66.0 in | Wt 155.6 lb

## 2023-07-12 DIAGNOSIS — F02B18 Dementia in other diseases classified elsewhere, moderate, with other behavioral disturbance: Secondary | ICD-10-CM

## 2023-07-12 DIAGNOSIS — Z23 Encounter for immunization: Secondary | ICD-10-CM

## 2023-07-12 DIAGNOSIS — G309 Alzheimer's disease, unspecified: Secondary | ICD-10-CM | POA: Diagnosis not present

## 2023-07-12 DIAGNOSIS — M810 Age-related osteoporosis without current pathological fracture: Secondary | ICD-10-CM

## 2023-07-12 DIAGNOSIS — R296 Repeated falls: Secondary | ICD-10-CM | POA: Diagnosis not present

## 2023-07-12 DIAGNOSIS — M170 Bilateral primary osteoarthritis of knee: Secondary | ICD-10-CM

## 2023-07-12 DIAGNOSIS — E559 Vitamin D deficiency, unspecified: Secondary | ICD-10-CM

## 2023-07-12 MED ORDER — DONEPEZIL HCL 5 MG PO TABS: 5.0000 mg | ORAL_TABLET | Freq: Every day | ORAL | 5 refills | Status: DC

## 2023-07-12 NOTE — Progress Notes (Signed)
Established Patient Office Visit  Subjective:  Patient ID: Brittany Mahoney, female    DOB: 03-Oct-1934  Age: 88 y.o. MRN: [id number]  CC:  Chief Complaint  Patient presents with   Memory Loss    HPI Brittany Mahoney is a 88 y.o. female with past medical history of osteoporosis, OA of knee, endometrial cancer s/p TAH, dementia and recurrent falls who presents for f/u of her chronic medical conditions. Her daughter is present during the visit today.  She takes Caltrate for osteoporosis. She still does not want PT. Denies any recent fall since the last visit.  Dementia: Her daughter is concerned that patient is forgetting more easily now.  Her MoCA was 21/30. She used to stay by her self during the daytime from 7 AM to 6 PM, but her daughter works from home most of the weeks now.  She feels down at times, but denies feeling depressed.  She feels lonely, and states that she tries to stay active with her cat.  She does play puzzles at times.  She sleeps about 4-5 hours at nighttime, but takes at least 2 naps during the day, lasting about 45 mins. Denies any SI of HI currently.  She has history of severe episode of depression in the past.  She had only 1 fall since the last visit, due to tripping from door base.  She had facial bruising, which resolved spontaneously after few weeks.  Denies any prodromal symptoms, LOC or shaking movements.  She denied to go to ER after the fall (around 06/24).    Past Medical History:  Diagnosis Date   Age-related osteoporosis without current pathological fracture    Arthritis    Arthropathy, unspecified    Broken hip (HCC)    Cancer (HCC)    uterus   Chronic kidney disease, stage 1    Closed right hip fracture (HCC) 04/09/2016   Endometrial cancer, grade I (HCC) 03/21/2015   Fracture of unspecified part of neck of right femur, sequela    Glaucoma    Headache(784.0)    migraines after periods   Hepatitis    Hx of cancer of endometrium 03/06/2013    Osteoporosis    Other abnormalities of gait and mobility    Other amnesia    Pain in left knee    Pain in right foot    Pain in right shoulder    Personal history of (healed) traumatic fracture    PMB (postmenopausal bleeding)    Repeated falls    Tremor, unspecified     Past Surgical History:  Procedure Laterality Date   ABDOMINAL HYSTERECTOMY  2007   TAH and BSO   COLONOSCOPY  07/31/2006   Dr.Rourk   DILATION AND CURETTAGE OF UTERUS     HIP FRACTURE SURGERY     INTRAMEDULLARY (IM) NAIL INTERTROCHANTERIC Right 04/10/2016   Procedure: INTRAMEDULLARY (IM) NAIL RIGHT HIP FRACTURE;  Surgeon: Kathryne Hitch, MD;  Location: [facility] OR;  Service: Orthopedics;  Laterality: Right;    Family History  Problem Relation Age of Onset   Stroke Maternal Grandfather    Stroke Paternal Grandfather    Cancer Mother    Cancer Father        lymphatic sarcoma   Early death Sister    Early death Son    Diabetes Son    Heart disease Other    Cancer Other    Tuberculosis Other    Diabetes Other    Thyroid disease Other  Stroke Other    Heart disease Maternal Aunt    Heart disease Maternal Uncle     Social History   Socioeconomic History   Marital status: Widowed    Spouse name: Not on file   Number of children: Not on file   Years of education: Not on file   Highest education level: Not on file  Occupational History   Not on file  Tobacco Use   Smoking status: Never   Smokeless tobacco: Never  Vaping Use   Vaping status: Never Used  Substance and Sexual Activity   Alcohol use: No   Drug use: No   Sexual activity: Not Currently    Birth control/protection: Surgical  Other Topics Concern   Not on file  Social History Narrative   Widow since 2016,married for 55 years.Lives with daughter.Airline pilot for [employer] ,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   Social Determinants of Health   Financial Resource Strain: Low Risk  (07/31/2022)   Overall Financial Resource Strain  (CARDIA)    Difficulty of Paying Living Expenses: Not hard at all  Food Insecurity: No Food Insecurity (07/31/2022)   Hunger Vital Sign    Worried About Running Out of Food in the Last Year: Never true    Ran Out of Food in the Last Year: Never true  Transportation Needs: No Transportation Needs (07/31/2022)   PRAPARE - Administrator, Civil Service (Medical): No    Lack of Transportation (Non-Medical): No  Physical Activity: Inactive (06/26/2021)   Exercise Vital Sign    Days of Exercise per Week: 0 days    Minutes of Exercise per Session: 0 min  Stress: No Stress Concern Present (07/31/2022)   [employer] of Occupational Health - Occupational Stress Questionnaire    Feeling of Stress : Not at all  Social Connections: Socially Isolated (07/31/2022)   Social Connection and Isolation Panel [NHANES]    Frequency of Communication with Friends and Family: More than three times a week    Frequency of Social Gatherings with Friends and Family: More than three times a week    Attends Religious Services: Never    Database administrator or Organizations: No    Attends Banker Meetings: Never    Marital Status: Widowed  Intimate Partner Violence: Not At Risk (06/26/2021)   Humiliation, Afraid, Rape, and Kick questionnaire    Fear of Current or Ex-Partner: No    Emotionally Abused: No    Physically Abused: No    Sexually Abused: No    Outpatient Medications Prior to Visit  Medication Sig Dispense Refill   Calcium Carb-Cholecalciferol (CALTRATE 600+D3 PO) Take by mouth.     celecoxib (CELEBREX) 100 MG capsule Take 1 capsule (100 mg total) by mouth 2 (two) times daily as needed for mild pain or moderate pain. 30 capsule 2   Cholecalciferol 1.25 MG (50000 UT) TABS Take 5,000 Int'l Units by mouth daily.     Cranberry 500 MG CAPS Take 500 mg by mouth daily.     LUMIGAN 0.01 % SOLN INSTILL 1 DROP IN INTO BOTH EYES AT BEDTIME  6   NONFORMULARY OR COMPOUNDED ITEM  Antifungal solution: Terbinafine 3%, Fluconazole 2%, Tea Tree Oil 5%, Urea 10%, Ibuprofen 2% in DMSO suspension #20mL 1 each 3   Nutritional Supplements (DHEA PO) Take 15 mg by mouth every morning.     No facility-administered medications prior to visit.    Allergies  Allergen Reactions   Codeine  Erythromycin    Morphine And Codeine     loopy   Penicillins Hives and Swelling    Has patient had a PCN reaction causing immediate rash, facial/tongue/throat swelling, SOB or lightheadedness with hypotension: Yes Has patient had a PCN reaction causing severe rash involving mucus membranes or skin necrosis: No Has patient had a PCN reaction that required hospitalization No Has patient had a PCN reaction occurring within the last 10 years: No  If all of the above answers are "NO", then may proceed with Cephalosporin use.     ROS Review of Systems  Constitutional:  Negative for chills and fever.  HENT:  Negative for congestion, sinus pressure, sinus pain and sore throat.   Eyes:  Negative for pain and discharge.  Respiratory:  Negative for cough and shortness of breath.   Cardiovascular:  Negative for chest pain and palpitations.  Gastrointestinal:  Negative for abdominal pain, diarrhea, nausea and vomiting.  Endocrine: Negative for polydipsia and polyuria.  Genitourinary:  Negative for dysuria and hematuria.  Musculoskeletal:  Positive for arthralgias, back pain and gait problem. Negative for neck pain and neck stiffness.  Skin:  Negative for rash.  Neurological:  Positive for tremors. Negative for dizziness and weakness.  Psychiatric/Behavioral:  Positive for dysphoric mood and sleep disturbance. Negative for agitation and behavioral problems.       Objective:    Physical Exam Vitals reviewed.  Constitutional:      General: She is not in acute distress.    Appearance: She is not diaphoretic.  HENT:     Head: Normocephalic and atraumatic.     Nose: Nose normal.      Mouth/Throat:     Mouth: Mucous membranes are moist.  Eyes:     General: No scleral icterus.    Extraocular Movements: Extraocular movements intact.  Cardiovascular:     Rate and Rhythm: Normal rate and regular rhythm.     Pulses: Normal pulses.     Heart sounds: Normal heart sounds. No murmur heard. Pulmonary:     Breath sounds: Normal breath sounds. No wheezing or rales.  Musculoskeletal:        General: Tenderness (B/l hips) present.     Cervical back: Neck supple. No tenderness.     Right lower leg: No edema.     Left lower leg: No edema.  Skin:    General: Skin is warm.     Comments: Livedo reticularis over b/l legs, chronic  Neurological:     General: No focal deficit present.     Mental Status: She is alert and oriented to person, place, and time. Mental status is at baseline.     Motor: Weakness (3/5 in b/l LE, 2/5 in b/l LE) present.  Psychiatric:        Mood and Affect: Mood normal.        Behavior: Behavior normal.     BP 122/80   Pulse (!) 101   Resp 16   Ht 5\' 6"  (1.676 m)   Wt 155 lb 9.6 oz (70.6 kg)   SpO2 96%   BMI 25.11 kg/m  Wt Readings from Last 3 Encounters:  07/12/23 155 lb 9.6 oz (70.6 kg)  11/05/22 155 lb 9.6 oz (70.6 kg)  05/04/22 145 lb 9.6 oz (66 kg)    Lab Results  Component Value Date   TSH 1.760 05/04/2022   Lab Results  Component Value Date   WBC 9.0 05/04/2022   HGB 14.7 05/04/2022   HCT 41.7  05/04/2022   MCV 91 05/04/2022   PLT 319 05/04/2022   Lab Results  Component Value Date   NA 144 11/05/2022   K 4.4 11/05/2022   CO2 26 11/05/2022   GLUCOSE 105 (H) 11/05/2022   BUN 13 11/05/2022   CREATININE 1.03 (H) 11/05/2022   BILITOT 1.1 09/14/2020   ALKPHOS 88 04/20/2019   AST 13 09/14/2020   ALT 14 09/14/2020   PROT 7.4 09/14/2020   ALBUMIN 4.1 04/20/2019   CALCIUM 10.2 11/05/2022   ANIONGAP 7 08/15/2016   EGFR 53 (L) 11/05/2022   Lab Results  Component Value Date   CHOL 125 03/09/2020   Lab Results  Component  Value Date   HDL 40 (L) 03/09/2020   Lab Results  Component Value Date   LDLCALC 70 03/09/2020   Lab Results  Component Value Date   TRIG 74 03/09/2020   Lab Results  Component Value Date   CHOLHDL 3.1 03/09/2020   No results found for: "HGBA1C"    Assessment & Plan:   Problem List Items Addressed This Visit       Nervous and Auditory   Dementia (HCC) - Primary    Mostly short-term memory issues with mild agitation at times Independent with ADLs MoCA: 21/30 She agrees to start medicine now Started donepezil 5 mg QD Check BMP, TSH, vitamin D and B12 Lives with daughter, who helps with IADLs.      Relevant Medications   donepezil (ARICEPT) 5 MG tablet   Other Relevant Orders   Basic Metabolic Panel (BMET)   TSH   W09     Musculoskeletal and Integument   Osteoporosis    Last DEXA scan reviewed Was on Fosamax in the past Currently on Caltrate plus vitamin D  Stopped HRT with estradiol and progesterone considering her age      OA (osteoarthritis) of knee    Chronic knee pain Advised to take Tylenol arthritis instead of Aleve Celebrex as needed for pain        Other   Repeated falls    Has had multiple falls due to gait problem in the past, but less frequent now She is able to walk with rolling walker, at her baseline Uses walker at home Advised for proper hydration and to avoid skipping any meal Avoid sudden positional changes      Other Visit Diagnoses     Vitamin D deficiency       Relevant Orders   Vitamin D (25 hydroxy)       Meds ordered this encounter  Medications   donepezil (ARICEPT) 5 MG tablet    Sig: Take 1 tablet (5 mg total) by mouth at bedtime.    Dispense:  30 tablet    Refill:  5    Follow-up: Return in about 6 months (around 01/09/2024) for Dementia.    Anabel Halon, MD

## 2023-07-12 NOTE — Patient Instructions (Signed)
Please start taking Donepezil as prescribed.  Please consider getting COVID vaccine at local pharmacy.

## 2023-07-12 NOTE — Assessment & Plan Note (Signed)
Chronic knee pain Advised to take Tylenol arthritis instead of Aleve Celebrex as needed for pain

## 2023-07-12 NOTE — Assessment & Plan Note (Addendum)
Mostly short-term memory issues with mild agitation at times Independent with ADLs MoCA: 21/30 She agrees to start medicine now Started donepezil 5 mg QD Check BMP, TSH, vitamin D and B12 Lives with daughter, who helps with IADLs.

## 2023-07-12 NOTE — Assessment & Plan Note (Signed)
Last DEXA scan reviewed Was on Fosamax in the past Currently on Caltrate plus vitamin D  Stopped HRT with estradiol and progesterone considering her age

## 2023-07-12 NOTE — Assessment & Plan Note (Signed)
Has had multiple falls due to gait problem in the past, but less frequent now She is able to walk with rolling walker, at her baseline Uses walker at home Advised for proper hydration and to avoid skipping any meal Avoid sudden positional changes

## 2023-07-13 LAB — VITAMIN B12: Vitamin B-12: 242 pg/mL (ref 232–1245)

## 2023-07-13 LAB — BASIC METABOLIC PANEL
BUN/Creatinine Ratio: 14 (ref 12–28)
BUN: 15 mg/dL (ref 8–27)
CO2: 24 mmol/L (ref 20–29)
Calcium: 9.9 mg/dL (ref 8.7–10.3)
Chloride: 103 mmol/L (ref 96–106)
Creatinine, Ser: 1.08 mg/dL — ABNORMAL HIGH (ref 0.57–1.00)
Glucose: 85 mg/dL (ref 70–99)
Potassium: 4.2 mmol/L (ref 3.5–5.2)
Sodium: 143 mmol/L (ref 134–144)
eGFR: 50 mL/min/{1.73_m2} — ABNORMAL LOW (ref >59)

## 2023-07-13 LAB — TSH: TSH: 1.88 u[IU]/mL (ref 0.450–4.500)

## 2023-07-13 LAB — VITAMIN D 25 HYDROXY (VIT D DEFICIENCY, FRACTURES): Vit D, 25-Hydroxy: 54 ng/mL (ref 30.0–100.0)

## 2023-07-16 ENCOUNTER — Encounter: Payer: Self-pay | Admitting: Podiatry

## 2023-07-16 ENCOUNTER — Ambulatory Visit: Payer: Medicare Other | Admitting: Podiatry

## 2023-07-16 DIAGNOSIS — M79674 Pain in right toe(s): Secondary | ICD-10-CM

## 2023-07-16 DIAGNOSIS — M79675 Pain in left toe(s): Secondary | ICD-10-CM

## 2023-07-16 DIAGNOSIS — B351 Tinea unguium: Secondary | ICD-10-CM | POA: Diagnosis not present

## 2023-07-16 NOTE — Progress Notes (Unsigned)
  Subjective:  Patient ID: Brittany Mahoney, female    DOB: [birth year],  MRN: [id number]  88 y.o. female presents with {jgcomplaint:23593} Chief Complaint  Patient presents with   RFC    RFC     PCP: Anabel Halon, MD.  New problem(s): None. {jgcomplaint:23593}  Review of Systems: Negative except as noted in the HPI.   Allergies  Allergen Reactions   Codeine    Erythromycin    Morphine And Codeine     loopy   Penicillins Hives and Swelling    Has patient had a PCN reaction causing immediate rash, facial/tongue/throat swelling, SOB or lightheadedness with hypotension: Yes Has patient had a PCN reaction causing severe rash involving mucus membranes or skin necrosis: No Has patient had a PCN reaction that required hospitalization No Has patient had a PCN reaction occurring within the last 10 years: No  If all of the above answers are "NO", then may proceed with Cephalosporin use.     Objective:  There were no vitals filed for this visit. Constitutional Patient is a pleasant 88 y.o. {Race/ethnicity:17218} female {jgbodyhabitus:24098} AAO x 3.  Vascular Capillary fill time to digits <3 seconds.  DP/PT pulse(s) are faintly palpable b/l lower extremities. Pedal hair absent b/l. Lower extremity skin temperature gradient warm to cool b/l. No pain with calf compression b/l. No cyanosis or clubbing noted. No ischemia nor gangrene noted b/l. {jgvascular:23595}  Neurologic Protective sensation intact 5/5 intact bilaterally with 10g monofilament b/l. Vibratory sensation intact b/l. No clonus b/l. {jgneuro:23601::"Protective sensation intact 5/5 intact bilaterally with 10g monofilament b/l.","Vibratory sensation intact b/l.","Proprioception intact bilaterally."}  Dermatologic Pedal skin is thin, shiny and atrophic b/l.  No open wounds b/l lower extremities. No interdigital macerations b/l lower extremities. Toenails 1-5 b/l elongated, discolored, dystrophic, thickened, crumbly with subungual  debris and tenderness to dorsal palpation. {jgderm:23598}  Orthopedic: Normal muscle strength 5/5 to all lower extremity muscle groups bilaterally. {jgmsk:23600}   Last HgA1c:      No data to display         Assessment:  No diagnosis found. Plan:  Patient was evaluated and treated and all questions answered. Consent given for treatment as described below: {jgplan:23602::"-Patient/POA to call should there be question/concern in the interim."}  Return in about 9 weeks (around 09/17/2023).  Freddie Breech, DPM

## 2023-07-31 ENCOUNTER — Ambulatory Visit: Payer: Medicare Other | Admitting: Podiatry

## 2023-08-29 DIAGNOSIS — H04123 Dry eye syndrome of bilateral lacrimal glands: Secondary | ICD-10-CM | POA: Diagnosis not present

## 2023-09-24 ENCOUNTER — Ambulatory Visit (INDEPENDENT_AMBULATORY_CARE_PROVIDER_SITE_OTHER): Payer: Medicare Other | Admitting: Podiatry

## 2023-09-24 ENCOUNTER — Encounter: Payer: Self-pay | Admitting: Podiatry

## 2023-09-24 DIAGNOSIS — B351 Tinea unguium: Secondary | ICD-10-CM

## 2023-09-24 DIAGNOSIS — L89510 Pressure ulcer of right ankle, unstageable: Secondary | ICD-10-CM | POA: Diagnosis not present

## 2023-09-24 DIAGNOSIS — M79675 Pain in left toe(s): Secondary | ICD-10-CM

## 2023-09-24 DIAGNOSIS — M79674 Pain in right toe(s): Secondary | ICD-10-CM

## 2023-09-30 ENCOUNTER — Encounter: Payer: Self-pay | Admitting: Podiatry

## 2023-09-30 NOTE — Progress Notes (Signed)
 Subjective:  Patient ID: Brittany Mahoney, female    DOB: 14-Mar-1935,  MRN: [id number]  Brittany Mahoney presents to clinic today for painful elongated mycotic toenails 1-5 bilaterally which are tender when wearing enclosed shoe gear. Pain is relieved with periodic professional debridement.  Chief Complaint  Patient presents with   Nail Problem    RFC patient last seen PCP in November , knows who her PCP is .   Patient is accompanied by her daughter on today's visit. Patient wore a new pair of slippers, same brand, different style and began to have discomfort on medial aspect of right foot. Daughter discontinued slippers and resumed her former pair of shoes. Patient developed a small lesion on medial ankle right foot. There has only been discomfort. No drainage, redness, or swelling from area.  PCP is [name], MD.  Allergies  Allergen Reactions   Codeine    Erythromycin    Morphine  And Codeine     loopy   Penicillins Hives and Swelling    Has patient had a PCN reaction causing immediate rash, facial/tongue/throat swelling, SOB or lightheadedness with hypotension: Yes Has patient had a PCN reaction causing severe rash involving mucus membranes or skin necrosis: No Has patient had a PCN reaction that required hospitalization No Has patient had a PCN reaction occurring within the last 10 years: No  If all of the above answers are NO, then may proceed with Cephalosporin use.     Review of Systems: Negative except as noted in the HPI.  Objective: No changes noted in today's physical examination. There were no vitals filed for this visit. Brittany Mahoney is a pleasant 89 y.o. female WD, WN in NAD. AAO x 3.  Vascular Examination: CFT <3 seconds b/l. DP/PT pulses faintly palpable b/l. Skin temperature gradient warm to warm b/l. No pain with calf compression. No ischemia or gangrene. No cyanosis or clubbing noted b/l.    Neurological Examination: Sensation grossly intact b/l with 10  gram monofilament. Vibratory sensation intact b/l.   Dermatological Examination: Pedal skin warm and supple b/l.   No open wounds. No interdigital macerations.  Toenails 1-5 b/l thick, discolored, elongated with subungual debris and pain on dorsal palpation.    Mild pressure keratosis noted medial malleolus with tenderness to palpation. No erythema, no edema, no drainage, no fluctuance.  Musculoskeletal Examination: Muscle strength 5/5 to all lower extremity muscle groups bilaterally. Pes planovalgus deformity noted b/l lower extremities.  Radiographs: None  Assessment/Plan: 1. Pain due to onychomycosis of toenails of both feet   2. Pressure injury of right ankle, unstageable (HCC)   -Patient was evaluated today. All questions/concerns addressed on today's visit. -Patient to continue soft, supportive shoe gear daily. -Patient sustained pressure injury medial right ankle which is resolving as source of pressure has been alleviated. Mild hyperkeratosis gently filed. No further treatment required by patient. -Mycotic toenails 1-5 bilaterally were debrided in length and girth with sterile nail nippers and dremel without incident. -Patient/POA to call should there be question/concern in the interim.   Return in about 9 weeks (around 11/26/2023).  Delon LITTIE Merlin, DPM      [place] [place] LOCATION: [address], [place] [address]  Office [phone]   [facility] LOCATION: [address] [place], [place] [address] Office [phone]

## 2023-10-03 DIAGNOSIS — H04123 Dry eye syndrome of bilateral lacrimal glands: Secondary | ICD-10-CM | POA: Diagnosis not present

## 2023-10-23 ENCOUNTER — Ambulatory Visit (INDEPENDENT_AMBULATORY_CARE_PROVIDER_SITE_OTHER): Payer: Medicare Other

## 2023-10-23 VITALS — Ht 65.0 in | Wt 130.0 lb

## 2023-10-23 DIAGNOSIS — Z0000 Encounter for general adult medical examination without abnormal findings: Secondary | ICD-10-CM | POA: Diagnosis not present

## 2023-10-23 NOTE — Patient Instructions (Signed)
Brittany Mahoney , Thank you for taking time to come for your Medicare Wellness Visit. I appreciate your ongoing commitment to your health goals. Please review the following plan we discussed and let me know if I can assist you in the future.   Referrals/Orders/Follow-Ups/Clinician Recommendations:  Next Medicare Annual Wellness Visit:  October 26, 2024 at 1:50 pm telephone visit.   This is a list of the screening recommended for you and due dates:  Health Maintenance  Topic Date Due   DTaP/Tdap/Td vaccine (1 - Tdap) Never done   Zoster (Shingles) Vaccine (1 of 2) Never done   COVID-19 Vaccine (3 - 2024-25 season) 05/12/2023   Medicare Annual Wellness Visit  10/22/2024   Pneumonia Vaccine  Completed   Flu Shot  Completed   DEXA scan (bone density measurement)  Completed   HPV Vaccine  Aged Out    Advanced directives: (Copy Requested) Please bring a copy of your health care power of attorney and living will to the office to be added to your chart at your convenience.  Next Medicare Annual Wellness Visit scheduled for next year: yes  Preventive Care 60 Years and Older, Female Preventive care refers to lifestyle choices and visits with your health care provider that can promote health and wellness. Preventive care visits are also called wellness exams. What can I expect for my preventive care visit? Counseling Your health care provider may ask you questions about your: Medical history, including: Past medical problems. Family medical history. Pregnancy and menstrual history. History of falls. Current health, including: Memory and ability to understand (cognition). Emotional well-being. Home life and relationship well-being. Sexual activity and sexual health. Lifestyle, including: Alcohol, nicotine or tobacco, and drug use. Access to firearms. Diet, exercise, and sleep habits. Work and work Astronomer. Sunscreen use. Safety issues such as seatbelt and bike helmet use. Physical  exam Your health care provider will check your: Height and weight. These may be used to calculate your BMI (body mass index). BMI is a measurement that tells if you are at a healthy weight. Waist circumference. This measures the distance around your waistline. This measurement also tells if you are at a healthy weight and may help predict your risk of certain diseases, such as type 2 diabetes and high blood pressure. Heart rate and blood pressure. Body temperature. Skin for abnormal spots. What immunizations do I need?  Vaccines are usually given at various ages, according to a schedule. Your health care provider will recommend vaccines for you based on your age, medical history, and lifestyle or other factors, such as travel or where you work. What tests do I need? Screening Your health care provider may recommend screening tests for certain conditions. This may include: Lipid and cholesterol levels. Hepatitis C test. Hepatitis B test. HIV (human immunodeficiency virus) test. STI (sexually transmitted infection) testing, if you are at risk. Lung cancer screening. Colorectal cancer screening. Diabetes screening. This is done by checking your blood sugar (glucose) after you have not eaten for a while (fasting). Mammogram. Talk with your health care provider about how often you should have regular mammograms. BRCA-related cancer screening. This may be done if you have a family history of breast, ovarian, tubal, or peritoneal cancers. Bone density scan. This is done to screen for osteoporosis. Talk with your health care provider about your test results, treatment options, and if necessary, the need for more tests. Follow these instructions at home: Eating and drinking  Eat a diet that includes fresh fruits and vegetables,  whole grains, lean protein, and low-fat dairy products. Limit your intake of foods with high amounts of sugar, saturated fats, and salt. Take vitamin and mineral  supplements as recommended by your health care provider. Do not drink alcohol if your health care provider tells you not to drink. If you drink alcohol: Limit how much you have to 0-1 drink a day. Know how much alcohol is in your drink. In the [place] one drink equals one 12 oz bottle of beer (355 mL), one 5 oz glass of wine (148 mL), or one 1 oz glass of hard liquor (44 mL). Lifestyle Brush your teeth every morning and night with fluoride toothpaste. Floss one time each day. Exercise for at least 30 minutes 5 or more days each week. Do not use any products that contain nicotine or tobacco. These products include cigarettes, chewing tobacco, and vaping devices, such as e-cigarettes. If you need help quitting, ask your health care provider. Do not use drugs. If you are sexually active, practice safe sex. Use a condom or other form of protection in order to prevent STIs. Take aspirin only as told by your health care provider. Make sure that you understand how much to take and what form to take. Work with your health care provider to find out whether it is safe and beneficial for you to take aspirin daily. Ask your health care provider if you need to take a cholesterol-lowering medicine (statin). Find healthy ways to manage stress, such as: Meditation, yoga, or listening to music. Journaling. Talking to a trusted person. Spending time with friends and family. Minimize exposure to UV radiation to reduce your risk of skin cancer. Safety Always wear your seat belt while driving or riding in a vehicle. Do not drive: If you have been drinking alcohol. Do not ride with someone who has been drinking. When you are tired or distracted. While texting. If you have been using any mind-altering substances or drugs. Wear a helmet and other protective equipment during sports activities. If you have firearms in your house, make sure you follow all gun safety procedures. What's next? Visit your health care  provider once a year for an annual wellness visit. Ask your health care provider how often you should have your eyes and teeth checked. Stay up to date on all vaccines. This information is not intended to replace advice given to you by your health care provider. Make sure you discuss any questions you have with your health care provider. Document Revised: 02/22/2021 Document Reviewed: 02/22/2021 Elsevier Patient Education  2024 [employer].  Understanding Your Risk for Falls Millions of people have serious injuries from falls each year. It is important to understand your risk of falling. Talk with your health care provider about your risk and what you can do to lower it. If you do have a serious fall, make sure to tell your provider. Falling once raises your risk of falling again. How can falls affect me? Serious injuries from falls are common. These include: Broken bones, such as hip fractures. Head injuries, such as traumatic brain injuries (TBI) or concussions. A fear of falling can cause you to avoid activities and stay at home. This can make your muscles weaker and raise your risk for a fall. What can increase my risk? There are a number of risk factors that increase your risk for falling. The more risk factors you have, the higher your risk of falling. Serious injuries from a fall happen most often to people who are  older than 87 years old. Teenagers and young adults ages 72-29 are also at higher risk. Common risk factors include: Weakness in the lower body. Being generally weak or confused due to long-term (chronic) illness. Dizziness or balance problems. Poor vision. Medicines that cause dizziness or drowsiness. These may include: Medicines for your blood pressure, heart, anxiety, insomnia, or swelling (edema). Pain medicines. Muscle relaxants. Other risk factors include: Drinking alcohol. Having had a fall in the past. Having foot pain or wearing improper footwear. Working at  a dangerous job. Having any of the following in your home: Tripping hazards, such as floor clutter or loose rugs. Poor lighting. Pets. Having dementia or memory loss. What actions can I take to lower my risk of falling?     Physical activity Stay physically fit. Do strength and balance exercises. Consider taking a regular class to build strength and balance. Yoga and tai chi are good options. Vision Have your eyes checked every year and your prescription for glasses or contacts updated as needed. Shoes and walking aids Wear non-skid shoes. Wear shoes that have rubber soles and low heels. Do not wear high heels. Do not walk around the house in socks or slippers. Use a cane or walker as told by your provider. Home safety Attach secure railings on both sides of your stairs. Install grab bars for your bathtub, shower, and toilet. Use a non-skid mat in your bathtub or shower. Attach bath mats securely with double-sided, non-slip rug tape. Use good lighting in all rooms. Keep a flashlight near your bed. Make sure there is a clear path from your bed to the bathroom. Use night-lights. Do not use throw rugs. Make sure all carpeting is taped or tacked down securely. Remove all clutter from walkways and stairways, including extension cords. Repair uneven or broken steps and floors. Avoid walking on icy or slippery surfaces. Walk on the grass instead of on icy or slick sidewalks. Use ice melter to get rid of ice on walkways in the winter. Use a cordless phone. Questions to ask your health care provider Can you help me check my risk for a fall? Do any of my medicines make me more likely to fall? Should I take a vitamin D supplement? What exercises can I do to improve my strength and balance? Should I make an appointment to have my vision checked? Do I need a bone density test to check for weak bones (osteoporosis)? Would it help to use a cane or a walker? Where to find more  information Centers for Disease Control and Prevention, [employer]: TonerPromos.no Community-Based Fall Prevention Programs: TonerPromos.no [employer] on Aging: BaseRingTones.pl Contact a health care provider if: You fall at home. You are afraid of falling at home. You feel weak, drowsy, or dizzy. This information is not intended to replace advice given to you by your health care provider. Make sure you discuss any questions you have with your health care provider. Document Revised: 04/30/2022 Document Reviewed: 04/30/2022 Elsevier Patient Education  2024 [employer].

## 2023-10-23 NOTE — Progress Notes (Signed)
Because this visit was a virtual/telehealth visit,  certain criteria was not obtained, such a blood pressure, CBG if applicable, and timed get up and go. Any medications not marked as "taking" were not mentioned during the medication reconciliation part of the visit. Any vitals not documented were not able to be obtained due to this being a telehealth visit or patient was unable to self-report a recent blood pressure reading due to a lack of equipment at home via telehealth. Vitals that have been documented are verbally provided by the patient.  Interactive audio and video telecommunications were attempted between this provider and patient, however failed, due to patient having technical difficulties OR patient did not have access to video capability.  We continued and completed visit with audio only.  Subjective:   Brittany Mahoney is a 89 y.o. female who presents for Medicare Annual (Subsequent) preventive examination.  Visit Complete: Virtual I connected with  Brittany Mahoney on 10/23/23 by a audio enabled telemedicine application and verified that I am speaking with the correct person using two identifiers.  Patient Location: Home  Provider Location: Home Office  I discussed the limitations of evaluation and management by telemedicine. The patient expressed understanding and agreed to proceed.  Vital Signs: Because this visit was a virtual/telehealth visit, some criteria may be missing or patient reported. Any vitals not documented were not able to be obtained and vitals that have been documented are patient reported.  Cardiac Risk Factors include: advanced age (>73men, >75 women)     Objective:    Today's Vitals   10/23/23 0804  Weight: 130 lb (59 kg)  Height: 5\' 5"  (1.651 m)   Body mass index is 21.63 kg/m.     10/23/2023    8:03 AM 07/31/2022    9:44 AM 06/26/2021   11:31 AM 03/30/2020    1:05 PM 12/10/2019    1:56 PM 10/02/2018    8:12 AM 09/16/2018    8:19 AM  Advanced Directives   Does Patient Have a Medical Advance Directive? No Yes Yes Yes Yes Yes Yes  Type of Furniture conservator/restorer;Living will Living will;Healthcare Power of [employer] Power of [employer] Power of [place];Living will Healthcare Power of [place];Living will Healthcare Power of [place];Living will  Does patient want to make changes to medical advance directive?    No - Patient declined No - Patient declined    Copy of Healthcare Power of Attorney in Chart?  No - copy requested No - copy requested No - copy requested     Would patient like information on creating a medical advance directive? No - Patient declined  No - Patient declined        Current Medications (verified) Outpatient Encounter Medications as of 10/23/2023  Medication Sig   Calcium Carb-Cholecalciferol (CALTRATE 600+D3 PO) Take by mouth.   celecoxib (CELEBREX) 100 MG capsule Take 1 capsule (100 mg total) by mouth 2 (two) times daily as needed for mild pain or moderate pain.   Cholecalciferol 1.25 MG (50000 UT) TABS Take 5,000 Int'l Units by mouth daily.   Cranberry 500 MG CAPS Take 500 mg by mouth daily.   donepezil (ARICEPT) 5 MG tablet Take 1 tablet (5 mg total) by mouth at bedtime.   LUMIGAN 0.01 % SOLN INSTILL 1 DROP IN INTO BOTH EYES AT BEDTIME   NONFORMULARY OR COMPOUNDED ITEM Antifungal solution: Terbinafine 3%, Fluconazole 2%, Tea Tree Oil 5%, Urea 10%, Ibuprofen 2% in DMSO suspension #65mL  Nutritional Supplements (DHEA PO) Take 15 mg by mouth every morning.   No facility-administered encounter medications on file as of 10/23/2023.    Allergies (verified) Codeine, Erythromycin, Morphine and codeine, and Penicillins   History: Past Medical History:  Diagnosis Date   Age-related osteoporosis without current pathological fracture    Arthritis    Arthropathy, unspecified    Broken hip (HCC)    Cancer (HCC)    uterus   Chronic kidney disease, stage 1    Closed right hip  fracture (HCC) 04/09/2016   Endometrial cancer, grade I (HCC) 03/21/2015   Fracture of unspecified part of neck of right femur, sequela    Glaucoma    Headache(784.0)    migraines after periods   Hepatitis    Hx of cancer of endometrium 03/06/2013   Osteoporosis    Other abnormalities of gait and mobility    Other amnesia    Pain in left knee    Pain in right foot    Pain in right shoulder    Personal history of (healed) traumatic fracture    PMB (postmenopausal bleeding)    Repeated falls    Tremor, unspecified    Past Surgical History:  Procedure Laterality Date   ABDOMINAL HYSTERECTOMY  2007   TAH and BSO   COLONOSCOPY  07/31/2006   Dr.Rourk   DILATION AND CURETTAGE OF UTERUS     HIP FRACTURE SURGERY     INTRAMEDULLARY (IM) NAIL INTERTROCHANTERIC Right 04/10/2016   Procedure: INTRAMEDULLARY (IM) NAIL RIGHT HIP FRACTURE;  Surgeon: Kathryne Hitch, MD;  Location: [facility] OR;  Service: Orthopedics;  Laterality: Right;   Family History  Problem Relation Age of Onset   Stroke Maternal Grandfather    Stroke Paternal Grandfather    Cancer Mother    Cancer Father        lymphatic sarcoma   Early death Sister    Early death Son    Diabetes Son    Heart disease Other    Cancer Other    Tuberculosis Other    Diabetes Other    Thyroid disease Other    Stroke Other    Heart disease Maternal Aunt    Heart disease Maternal Uncle    Social History   Socioeconomic History   Marital status: Widowed    Spouse name: Not on file   Number of children: Not on file   Years of education: Not on file   Highest education level: Not on file  Occupational History   Not on file  Tobacco Use   Smoking status: Never   Smokeless tobacco: Never  Vaping Use   Vaping status: Never Used  Substance and Sexual Activity   Alcohol use: No   Drug use: No   Sexual activity: Not Currently    Birth control/protection: Surgical  Other Topics Concern   Not on file  Social History Narrative    Widow since 2016,married for 55 years.Lives with daughter.Airline pilot for [employer] ,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   Social Drivers of Corporate investment banker Strain: Low Risk  (10/23/2023)   Overall Financial Resource Strain (CARDIA)    Difficulty of Paying Living Expenses: Not hard at all  Food Insecurity: No Food Insecurity (10/23/2023)   Hunger Vital Sign    Worried About Running Out of Food in the Last Year: Never true    Ran Out of Food in the Last Year: Never true  Transportation Needs: No Transportation Needs (10/23/2023)   PRAPARE -  Administrator, Civil Service (Medical): No    Lack of Transportation (Non-Medical): No  Physical Activity: Inactive (10/23/2023)   Exercise Vital Sign    Days of Exercise per Week: 0 days    Minutes of Exercise per Session: 0 min  Stress: No Stress Concern Present (10/23/2023)   [employer] of Occupational Health - Occupational Stress Questionnaire    Feeling of Stress : Not at all  Social Connections: Socially Isolated (10/23/2023)   Social Connection and Isolation Panel [NHANES]    Frequency of Communication with Friends and Family: More than three times a week    Frequency of Social Gatherings with Friends and Family: More than three times a week    Attends Religious Services: Never    Database administrator or Organizations: No    Attends Banker Meetings: Never    Marital Status: Widowed    Tobacco Counseling Counseling given: Yes   Clinical Intake:  Pre-visit preparation completed: Yes  Pain : No/denies pain     BMI - recorded: 21.63 Nutritional Status: BMI of 19-24  Normal Nutritional Risks: None Diabetes: No  How often do you need to have someone help you when you read instructions, pamphlets, or other written materials from your doctor or pharmacy?: 1 - Never  Interpreter Needed?: No  Information entered by :: Maryjean Ka CMA   Activities of Daily Living    10/23/2023    8:15 AM   In your present state of health, do you have any difficulty performing the following activities:  Hearing? 0  Vision? 0  Difficulty concentrating or making decisions? 0  Walking or climbing stairs? 0  Dressing or bathing? 1  Comment pt is able to bath herself but her daughter helps her get into the shower.  Doing errands, shopping? 0  Preparing Food and eating ? Y  Comment daughter prepares food that has to be cooked. worried that patient will turn stove on and forget to cut it back off.  Using the Toilet? N  In the past six months, have you accidently leaked urine? N  Do you have problems with loss of bowel control? N  Managing your Medications? N  Managing your Finances? N  Housekeeping or managing your Housekeeping? N    Patient Care Team: Anabel Halon, MD as PCP - General (Internal Medicine) Freddie Breech, DPM as Consulting Physician (Podiatry) Daisy Lazar, DO (Optometry)  Indicate any recent Medical Services you may have received from other than [facility] providers in the past year (date may be approximate).     Assessment:   This is a routine wellness examination for [facility].  Hearing/Vision screen Hearing Screening - Comments:: Patient denies any hearing difficulties.   Vision Screening - Comments:: Wears rx glasses - up to date with routine eye exams  Patient sees Dr. Daisy Lazar w/ [facility] [place] office.     Goals Addressed             This Visit's Progress    Patient Stated       Stay active in the house. Enjoys cleaning the house when she can go from one end to the other.        Depression Screen    10/23/2023    8:21 AM 07/12/2023    9:46 AM 11/05/2022   10:23 AM 07/31/2022    9:45 AM 11/03/2021   11:02 AM 06/26/2021   11:33 AM 06/26/2021   11:29 AM  PHQ  2/9 Scores  PHQ - 2 Score 0 0 3 0 2 0 0  PHQ- 9 Score 0  10  3      Fall Risk    10/23/2023    8:15 AM 07/12/2023    9:46 AM 11/05/2022   10:23 AM 07/31/2022    9:45 AM 05/04/2022    10:10 AM  Fall Risk   Falls in the past year? 1 1 1 1 1   Number falls in past yr: 1 1 0 0 1  Injury with Fall? 1 0 0 1 1  Risk for fall due to : History of fall(s);Impaired balance/gait;Orthopedic patient Impaired balance/gait;Impaired mobility  History of fall(s) History of fall(s);Impaired balance/gait  Follow up Falls prevention discussed;Education provided Falls evaluation completed  Falls evaluation completed Falls evaluation completed;Education provided;Falls prevention discussed    MEDICARE RISK AT HOME: Medicare Risk at Home Any stairs in or around the home?: No If so, are there any without handrails?: No Home free of loose throw rugs in walkways, pet beds, electrical cords, etc?: Yes Adequate lighting in your home to reduce risk of falls?: Yes Life alert?: No Use of a cane, walker or w/c?: Yes Grab bars in the bathroom?: Yes Shower chair or bench in shower?: Yes Elevated toilet seat or a handicapped toilet?: Yes  TIMED UP AND GO:  Was the test performed?  No    Cognitive Function:    06/26/2021   11:34 AM 10/26/2019   11:02 AM  MMSE - Mini Mental State Exam  Not completed: Unable to complete   Orientation to time  5  Orientation to Place  5  Registration  3  Attention/ Calculation  3  Recall  2  Language- name 2 objects  2  Language- repeat  1  Language- follow 3 step command  2  Language- read & follow direction  1  Write a sentence  1  Copy design  1  Total score  26        10/23/2023    8:12 AM 07/31/2022    9:48 AM 06/26/2021   11:34 AM 03/30/2020    1:08 PM  6CIT Screen  What Year? 0 points 0 points 0 points 0 points  What month? 0 points 0 points 0 points 0 points  What time? 0 points 0 points 0 points 0 points  Count back from 20 0 points 0 points 0 points 0 points  Months in reverse 0 points 0 points 0 points 0 points  Repeat phrase 0 points 0 points 2 points 0 points  Total Score 0 points 0 points 2 points 0 points     Immunizations Immunization History  Administered Date(s) Administered   Fluad Quad(high Dose 65+) 07/11/2019, 07/13/2020, 06/23/2021   Fluad Trivalent(High Dose 65+) 07/12/2023   Influenza, High Dose Seasonal PF 07/02/2017, 07/19/2018   PFIZER(Purple Top)SARS-COV-2 Vaccination 12/12/2019, 01/06/2020   Pneumococcal Conjugate-13 09/14/2020   Pneumococcal Polysaccharide-23 06/14/2014    TDAP status: Due, Education has been provided regarding the importance of this vaccine. Advised may receive this vaccine at local pharmacy or Health Dept. Aware to provide a copy of the vaccination record if obtained from local pharmacy or Health Dept. Verbalized acceptance and understanding.  Flu Vaccine status: Up to date  Pneumococcal vaccine status: Up to date  Covid-19 vaccine status: Information provided on how to obtain vaccines.   Qualifies for Shingles Vaccine? Yes   Zostavax completed No   Shingrix Completed?: No.    Education has been provided  regarding the importance of this vaccine. Patient has been advised to call insurance company to determine out of pocket expense if they have not yet received this vaccine. Advised may also receive vaccine at local pharmacy or Health Dept. Verbalized acceptance and understanding.  Screening Tests Health Maintenance  Topic Date Due   DTaP/Tdap/Td (1 - Tdap) Never done   Zoster Vaccines- Shingrix (1 of 2) Never done   COVID-19 Vaccine (3 - 2024-25 season) 05/12/2023   Medicare Annual Wellness (AWV)  08/01/2023   Pneumonia Vaccine 38+ Years old  Completed   INFLUENZA VACCINE  Completed   DEXA SCAN  Completed   HPV VACCINES  Aged Out    Health Maintenance  Health Maintenance Due  Topic Date Due   DTaP/Tdap/Td (1 - Tdap) Never done   Zoster Vaccines- Shingrix (1 of 2) Never done   COVID-19 Vaccine (3 - 2024-25 season) 05/12/2023   Medicare Annual Wellness (AWV)  08/01/2023    Colorectal cancer screening: No longer required.   Mammogram  status: No longer required due to age.  Bone Density Screening: Not age appropriate for this patient.   Lung Cancer Screening: (Low Dose CT Chest recommended if Age 61-80 years, 20 pack-year currently smoking OR have quit w/in 15years.) does not qualify.   Lung Cancer Screening Referral: na  Additional Screening:  Hepatitis C Screening: does not qualify   Vision Screening: Recommended annual ophthalmology exams for early detection of glaucoma and other disorders of the eye. Is the patient up to date with their annual eye exam?  Yes  Who is the provider or what is the name of the office in which the patient attends annual eye exams? [facility]   Dental Screening: Recommended annual dental exams for proper oral hygiene  Diabetic Foot Exam: na  Community Resource Referral / Chronic Care Management: CRR required this visit?  No   CCM required this visit?  No     Plan:     I have personally reviewed and noted the following in the patient's chart:   Medical and social history Use of alcohol, tobacco or illicit drugs  Current medications and supplements including opioid prescriptions. Patient is not currently taking opioid prescriptions. Functional ability and status Nutritional status Physical activity Advanced directives List of other physicians Hospitalizations, surgeries, and ER visits in previous 12 months Vitals Screenings to include cognitive, depression, and falls Referrals and appointments  In addition, I have reviewed and discussed with patient certain preventive protocols, quality metrics, and best practice recommendations. A written personalized care plan for preventive services as well as general preventive health recommendations were provided to patient.     Jordan Hawks Gerald Kuehl, CMA   10/23/2023   After Visit Summary: (MyChart) Due to this being a telephonic visit, the after visit summary with patients personalized plan was offered to patient via MyChart    Nurse Notes: see routing comment

## 2023-12-03 ENCOUNTER — Ambulatory Visit: Payer: Medicare Other | Admitting: Podiatry

## 2023-12-03 ENCOUNTER — Encounter: Payer: Self-pay | Admitting: Podiatry

## 2023-12-03 VITALS — Ht 65.0 in | Wt 130.0 lb

## 2023-12-03 DIAGNOSIS — M79674 Pain in right toe(s): Secondary | ICD-10-CM | POA: Diagnosis not present

## 2023-12-03 DIAGNOSIS — M79675 Pain in left toe(s): Secondary | ICD-10-CM | POA: Diagnosis not present

## 2023-12-03 DIAGNOSIS — B351 Tinea unguium: Secondary | ICD-10-CM | POA: Diagnosis not present

## 2023-12-08 NOTE — Progress Notes (Signed)
  Subjective:  Patient ID: Brittany Mahoney, female    DOB: 1935/01/08,  MRN: [id number]  89 y.o. female presents with painful elongated mycotic toenails 1-5 bilaterally which are tender when wearing enclosed shoe gear. Pain is relieved with periodic professional debridement. She is accompanied by her daughter on today's visit. Chief Complaint  Patient presents with   RFC    She is here for a nail trim, PCP is Dr patel and seen 8 months ago.      PCP: Anabel Halon, MD.  New problem(s): None.   Review of Systems: Negative except as noted in the HPI.   Allergies  Allergen Reactions   Penicillins Hives and Swelling    Has patient had a PCN reaction causing immediate rash, facial/tongue/throat swelling, SOB or lightheadedness with hypotension: Yes Has patient had a PCN reaction causing severe rash involving mucus membranes or skin necrosis: No Has patient had a PCN reaction that required hospitalization No Has patient had a PCN reaction occurring within the last 10 years: No  If all of the above answers are "NO", then may proceed with Cephalosporin use.    Codeine    Erythromycin    Morphine And Codeine Other (See Comments)    loopy    Objective:  There were no vitals filed for this visit. Constitutional Patient is a pleasant 89 y.o. female WD, WN in NAD. AAO x 3.  Vascular Capillary fill time to digits <3 seconds.  DP/PT pulse(s) are faintly palpable b/l lower extremities. Pedal hair absent b/l. Lower extremity skin temperature gradient warm to cool b/l. No pain with calf compression b/l. No cyanosis or clubbing noted. No ischemia nor gangrene noted b/l.   Neurologic Protective sensation intact 5/5 intact bilaterally with 10g monofilament b/l. Vibratory sensation intact b/l. No clonus b/l.   Dermatologic Pedal skin is thin, shiny and atrophic b/l.  No open wounds b/l lower extremities. No interdigital macerations b/l lower extremities. Toenails 1-5 b/l elongated, discolored,  dystrophic, thickened, crumbly with subungual debris and tenderness to dorsal palpation. Minimal hyperkeratos(is/es) noted medial malleolus RLE..  Orthopedic: Normal muscle strength 5/5 to all lower extremity muscle groups bilaterally. Pes planovalgus deformity noted b/l lower extremities right >left.   Last HgA1c:      No data to display           Assessment:   1. Pain due to onychomycosis of toenails of both feet    Plan:  Consent given for treatment. Patient examined. All patient's and/or POA's questions/concerns addressed on today's visit.Toenails 1-5 debrided in length and girth without incident. Continue soft, supportive shoe gear daily. Report any pedal injuries to medical professional. Call office if there are any questions/concerns. -Patient/POA to call should there be question/concern in the interim.  Return in about 9 weeks (around 02/04/2024).  Freddie Breech, DPM      [place] LOCATION: [address] [place] [address]                   Office [phone]   [facility] LOCATION: [address] [place], [place] [address] Office [phone]

## 2024-01-07 ENCOUNTER — Telehealth: Payer: Self-pay

## 2024-01-07 NOTE — Telephone Encounter (Signed)
 Copied from CRM [id number]. Topic: Appointments - Appointment Cancel/Reschedule >> Jan 07, 2024  8:32 AM Oddis Bench wrote: Patient/patient representative is calling to reschedule an appointment. Refer to attachments for appointment information.

## 2024-01-10 ENCOUNTER — Ambulatory Visit: Payer: Medicare Other | Admitting: Internal Medicine

## 2024-02-04 ENCOUNTER — Ambulatory Visit: Payer: Medicare Other | Admitting: Podiatry

## 2024-02-11 ENCOUNTER — Ambulatory Visit: Payer: Medicare Other | Admitting: Podiatry

## 2024-03-10 ENCOUNTER — Other Ambulatory Visit: Payer: Self-pay

## 2024-03-10 ENCOUNTER — Inpatient Hospital Stay (HOSPITAL_COMMUNITY)
Admission: EM | Admit: 2024-03-10 | Discharge: 2024-03-12 | DRG: 543 | Disposition: A | Discharge status: Skilled Nursing Facility | Attending: Internal Medicine | Admitting: Internal Medicine

## 2024-03-10 ENCOUNTER — Emergency Department (HOSPITAL_COMMUNITY)

## 2024-03-10 ENCOUNTER — Encounter (HOSPITAL_COMMUNITY): Payer: Self-pay

## 2024-03-10 DIAGNOSIS — B9620 Unspecified Escherichia coli [E. coli] as the cause of diseases classified elsewhere: Secondary | ICD-10-CM | POA: Diagnosis present

## 2024-03-10 DIAGNOSIS — M810 Age-related osteoporosis without current pathological fracture: Secondary | ICD-10-CM | POA: Diagnosis present

## 2024-03-10 DIAGNOSIS — M6281 Muscle weakness (generalized): Secondary | ICD-10-CM | POA: Diagnosis not present

## 2024-03-10 DIAGNOSIS — W1839XA Other fall on same level, initial encounter: Secondary | ICD-10-CM | POA: Diagnosis present

## 2024-03-10 DIAGNOSIS — M159 Polyosteoarthritis, unspecified: Secondary | ICD-10-CM | POA: Diagnosis not present

## 2024-03-10 DIAGNOSIS — Z880 Allergy status to penicillin: Secondary | ICD-10-CM

## 2024-03-10 DIAGNOSIS — F0390 Unspecified dementia without behavioral disturbance: Secondary | ICD-10-CM | POA: Diagnosis not present

## 2024-03-10 DIAGNOSIS — S32050A Wedge compression fracture of fifth lumbar vertebra, initial encounter for closed fracture: Secondary | ICD-10-CM | POA: Diagnosis not present

## 2024-03-10 DIAGNOSIS — E876 Hypokalemia: Secondary | ICD-10-CM

## 2024-03-10 DIAGNOSIS — Z8249 Family history of ischemic heart disease and other diseases of the circulatory system: Secondary | ICD-10-CM | POA: Diagnosis not present

## 2024-03-10 DIAGNOSIS — N181 Chronic kidney disease, stage 1: Secondary | ICD-10-CM | POA: Diagnosis present

## 2024-03-10 DIAGNOSIS — M549 Dorsalgia, unspecified: Secondary | ICD-10-CM | POA: Diagnosis not present

## 2024-03-10 DIAGNOSIS — Z881 Allergy status to other antibiotic agents status: Secondary | ICD-10-CM | POA: Diagnosis not present

## 2024-03-10 DIAGNOSIS — N390 Urinary tract infection, site not specified: Secondary | ICD-10-CM

## 2024-03-10 DIAGNOSIS — Z79899 Other long term (current) drug therapy: Secondary | ICD-10-CM | POA: Diagnosis not present

## 2024-03-10 DIAGNOSIS — W19XXXA Unspecified fall, initial encounter: Secondary | ICD-10-CM

## 2024-03-10 DIAGNOSIS — R9082 White matter disease, unspecified: Secondary | ICD-10-CM | POA: Diagnosis not present

## 2024-03-10 DIAGNOSIS — H409 Unspecified glaucoma: Secondary | ICD-10-CM | POA: Diagnosis present

## 2024-03-10 DIAGNOSIS — M4802 Spinal stenosis, cervical region: Secondary | ICD-10-CM | POA: Diagnosis not present

## 2024-03-10 DIAGNOSIS — M5030 Other cervical disc degeneration, unspecified cervical region: Secondary | ICD-10-CM | POA: Diagnosis not present

## 2024-03-10 DIAGNOSIS — M4856XA Collapsed vertebra, not elsewhere classified, lumbar region, initial encounter for fracture: Secondary | ICD-10-CM | POA: Diagnosis not present

## 2024-03-10 DIAGNOSIS — M4186 Other forms of scoliosis, lumbar region: Secondary | ICD-10-CM | POA: Diagnosis not present

## 2024-03-10 DIAGNOSIS — Z9181 History of falling: Secondary | ICD-10-CM | POA: Diagnosis not present

## 2024-03-10 DIAGNOSIS — M160 Bilateral primary osteoarthritis of hip: Secondary | ICD-10-CM | POA: Diagnosis not present

## 2024-03-10 DIAGNOSIS — Z4789 Encounter for other orthopedic aftercare: Secondary | ICD-10-CM | POA: Diagnosis not present

## 2024-03-10 DIAGNOSIS — Y92009 Unspecified place in unspecified non-institutional (private) residence as the place of occurrence of the external cause: Secondary | ICD-10-CM

## 2024-03-10 DIAGNOSIS — S0003XA Contusion of scalp, initial encounter: Secondary | ICD-10-CM | POA: Diagnosis not present

## 2024-03-10 DIAGNOSIS — Z8542 Personal history of malignant neoplasm of other parts of uterus: Secondary | ICD-10-CM

## 2024-03-10 DIAGNOSIS — Z885 Allergy status to narcotic agent status: Secondary | ICD-10-CM | POA: Diagnosis not present

## 2024-03-10 DIAGNOSIS — M4312 Spondylolisthesis, cervical region: Secondary | ICD-10-CM | POA: Diagnosis not present

## 2024-03-10 DIAGNOSIS — N904 Leukoplakia of vulva: Secondary | ICD-10-CM | POA: Diagnosis not present

## 2024-03-10 DIAGNOSIS — S32000A Wedge compression fracture of unspecified lumbar vertebra, initial encounter for closed fracture: Secondary | ICD-10-CM | POA: Diagnosis present

## 2024-03-10 DIAGNOSIS — S32000D Wedge compression fracture of unspecified lumbar vertebra, subsequent encounter for fracture with routine healing: Secondary | ICD-10-CM | POA: Diagnosis not present

## 2024-03-10 DIAGNOSIS — M1990 Unspecified osteoarthritis, unspecified site: Secondary | ICD-10-CM | POA: Diagnosis present

## 2024-03-10 DIAGNOSIS — R531 Weakness: Principal | ICD-10-CM

## 2024-03-10 DIAGNOSIS — M51369 Other intervertebral disc degeneration, lumbar region without mention of lumbar back pain or lower extremity pain: Secondary | ICD-10-CM | POA: Diagnosis not present

## 2024-03-10 DIAGNOSIS — R488 Other symbolic dysfunctions: Secondary | ICD-10-CM | POA: Diagnosis not present

## 2024-03-10 DIAGNOSIS — M419 Scoliosis, unspecified: Secondary | ICD-10-CM | POA: Diagnosis not present

## 2024-03-10 DIAGNOSIS — M48061 Spinal stenosis, lumbar region without neurogenic claudication: Secondary | ICD-10-CM | POA: Diagnosis not present

## 2024-03-10 DIAGNOSIS — Z90710 Acquired absence of both cervix and uterus: Secondary | ICD-10-CM | POA: Diagnosis not present

## 2024-03-10 DIAGNOSIS — M545 Low back pain, unspecified: Secondary | ICD-10-CM | POA: Diagnosis not present

## 2024-03-10 DIAGNOSIS — R262 Difficulty in walking, not elsewhere classified: Secondary | ICD-10-CM | POA: Diagnosis not present

## 2024-03-10 LAB — CK: Total CK: 54 U/L (ref 38–234)

## 2024-03-10 LAB — CBC
HCT: 41.8 % (ref 36.0–46.0)
Hemoglobin: 13.9 g/dL (ref 12.0–15.0)
MCH: 31 pg (ref 26.0–34.0)
MCHC: 33.3 g/dL (ref 30.0–36.0)
MCV: 93.3 fL (ref 80.0–100.0)
Platelets: 271 10*3/uL (ref 150–400)
RBC: 4.48 MIL/uL (ref 3.87–5.11)
RDW: 12.4 % (ref 11.5–15.5)
WBC: 10.3 10*3/uL (ref 4.0–10.5)
nRBC: 0 % (ref 0.0–0.2)

## 2024-03-10 LAB — URINALYSIS, ROUTINE W REFLEX MICROSCOPIC
Bilirubin Urine: NEGATIVE
Glucose, UA: NEGATIVE mg/dL
Ketones, ur: NEGATIVE mg/dL
Nitrite: POSITIVE — AB
Protein, ur: NEGATIVE mg/dL
Specific Gravity, Urine: 1.009 (ref 1.005–1.030)
pH: 7 (ref 5.0–8.0)

## 2024-03-10 LAB — FOLATE: Folate: 25.6 ng/mL (ref >5.9)

## 2024-03-10 LAB — VITAMIN B12: Vitamin B-12: 172 pg/mL — ABNORMAL LOW (ref 180–914)

## 2024-03-10 LAB — TSH: TSH: 1.41 u[IU]/mL (ref 0.350–4.500)

## 2024-03-10 LAB — BASIC METABOLIC PANEL WITH GFR
Anion gap: 11 (ref 5–15)
BUN: 14 mg/dL (ref 8–23)
CO2: 23 mmol/L (ref 22–32)
Calcium: 8.7 mg/dL — ABNORMAL LOW (ref 8.9–10.3)
Chloride: 107 mmol/L (ref 98–111)
Creatinine, Ser: 0.79 mg/dL (ref 0.44–1.00)
GFR, Estimated: >60 mL/min (ref >60)
Glucose, Bld: 112 mg/dL — ABNORMAL HIGH (ref 70–99)
Potassium: 3.3 mmol/L — ABNORMAL LOW (ref 3.5–5.1)
Sodium: 141 mmol/L (ref 135–145)

## 2024-03-10 MED ORDER — HEPARIN SODIUM (PORCINE) 5000 UNIT/ML IJ SOLN
5000.0000 [IU] | Freq: Three times a day (TID) | INTRAMUSCULAR | Status: DC
  Administered 2024-03-10 – 2024-03-12 (×5): 5000 [IU] via SUBCUTANEOUS
  Filled 2024-03-10 (×5): qty 1

## 2024-03-10 MED ORDER — LACTATED RINGERS IV BOLUS
1000.0000 mL | Freq: Once | INTRAVENOUS | Status: AC
  Administered 2024-03-10: 1000 mL via INTRAVENOUS

## 2024-03-10 MED ORDER — ONDANSETRON HCL 4 MG/2ML IJ SOLN
4.0000 mg | Freq: Once | INTRAMUSCULAR | Status: AC
  Administered 2024-03-10: 4 mg via INTRAVENOUS
  Filled 2024-03-10: qty 2

## 2024-03-10 MED ORDER — SODIUM CHLORIDE 0.9 % IV SOLN
1.0000 g | INTRAVENOUS | Status: DC
  Administered 2024-03-11 – 2024-03-12 (×2): 1 g via INTRAVENOUS
  Filled 2024-03-10 (×2): qty 10

## 2024-03-10 MED ORDER — CRANBERRY 500 MG PO CAPS: 500.0000 mg | ORAL_CAPSULE | Freq: Every day | ORAL | Status: DC

## 2024-03-10 MED ORDER — ACETAMINOPHEN 650 MG RE SUPP: 650.0000 mg | Freq: Four times a day (QID) | RECTAL | Status: DC | PRN

## 2024-03-10 MED ORDER — MORPHINE SULFATE (PF) 2 MG/ML IV SOLN
2.0000 mg | Freq: Once | INTRAVENOUS | Status: AC
  Administered 2024-03-10: 2 mg via INTRAVENOUS
  Filled 2024-03-10: qty 1

## 2024-03-10 MED ORDER — LATANOPROST 0.005 % OP SOLN
1.0000 [drp] | Freq: Every day | OPHTHALMIC | Status: DC
  Filled 2024-03-10: qty 2.5

## 2024-03-10 MED ORDER — ACETAMINOPHEN 325 MG PO TABS: 650.0000 mg | ORAL_TABLET | Freq: Four times a day (QID) | ORAL | Status: DC | PRN

## 2024-03-10 MED ORDER — ONDANSETRON HCL 4 MG/2ML IJ SOLN: 4.0000 mg | Freq: Four times a day (QID) | INTRAMUSCULAR | Status: DC | PRN

## 2024-03-10 MED ORDER — OXYCODONE HCL 5 MG PO TABS
5.0000 mg | ORAL_TABLET | Freq: Four times a day (QID) | ORAL | Status: DC | PRN
  Filled 2024-03-10: qty 1

## 2024-03-10 MED ORDER — SODIUM CHLORIDE 0.9 % IV SOLN
1.0000 g | Freq: Once | INTRAVENOUS | Status: AC
  Administered 2024-03-10: 1 g via INTRAVENOUS
  Filled 2024-03-10: qty 10

## 2024-03-10 MED ORDER — POTASSIUM CHLORIDE 20 MEQ PO PACK
40.0000 meq | PACK | Freq: Once | ORAL | Status: AC
  Administered 2024-03-10: 40 meq via ORAL
  Filled 2024-03-10: qty 2

## 2024-03-10 MED ORDER — ACETAMINOPHEN 500 MG PO TABS
1000.0000 mg | ORAL_TABLET | Freq: Three times a day (TID) | ORAL | Status: DC
  Administered 2024-03-10 – 2024-03-12 (×4): 1000 mg via ORAL
  Filled 2024-03-10 (×5): qty 2

## 2024-03-10 MED ORDER — ONDANSETRON HCL 4 MG PO TABS: 4.0000 mg | ORAL_TABLET | Freq: Four times a day (QID) | ORAL | Status: DC | PRN

## 2024-03-10 NOTE — ED Triage Notes (Signed)
 Pt bib [facility] from home c/o lower back pain that radiates down left leg after a fall this morning. Denies hitting head, no LOC.

## 2024-03-10 NOTE — ED Notes (Signed)
 Cleaned up from saturated in urine.  Brief changed as well.  Patient had one episode of bile vomit upon arrival.

## 2024-03-10 NOTE — Evaluation (Signed)
 Physical Therapy Evaluation Patient Details Name: Brittany Mahoney MRN: [id number] DOB: 01-31-1935 Today's Date: 03/10/2024  History of Present Illness  Brittany Mahoney is a 89 year old female OA of knee, endometrial cancer s/p TAH, cognitive impairment (MoCA 21/30) presents with a mechanical fall.  The patient was in her recliner.  She stood up with her walker and the walker slid out from under her.  She fell and believes she hit her head and right side.  Fortunately, the patient had her cell phone.  She contacted her neighbor who came to help her.  EMS was activated.  Prior to the fall, the patient had been in her usual state of health without any significant complaints.  Patient denies fevers, chills, headache, chest pain, dyspnea, nausea, vomiting, diarrhea, abdominal pain, dysuria, hematuria, hematochezia, and melena.  The patient had been living with her daughter who was helping her.  Unfortunately, her daughter has been hospitalized with a medical illness, and the patient has been alone for over a week.  The patient denies any dizziness, syncope.  She denies any radicular type symptoms.  She denies any dysesthesias in her upper or lower extremities.  She denies any bowel or bladder incontinence.  She has low back pain but this is positional since her fall.  She denies any visual or speech disturbance.  In the ED, the patient was afebrile and hemodynamically stable with oxygen saturation 92% on room air.  WBC 10.3, hemoglobin 13.9, platelet 271.  Sodium 141, potassium 3.3, bicarbonate 23, serum creatinine 0.79.  EKG showed sinus rhythm with no ST ST wave changes.  UA showed 21-50WBC  CT lumbar spine compression fracture, age-indeterminate.  There is mild retropulsion L5.  There is no fracture or dislocation.  There is foraminal stenosis L4-5.  CT of the cervical spine was negative for traumatic injury.  CT brain was negative.   Clinical Impression  Patient demonstrates fair/good carryover for rolling to side  and sitting up from side lying position having to use bed rail and HOB partially raised, tolerated ambulating in room/hallway with slow labored movement without loss of balance and limited mostly due to fatigue and mild in crease in low back pain. Patient tolerated sitting up in chair after therapy - nurse notified. Patient will benefit from continued skilled physical therapy in hospital and recommended venue below to increase strength, balance, endurance for safe ADLs and gait.          If plan is discharge home, recommend the following: A little help with walking and/or transfers;A little help with bathing/dressing/bathroom;Help with stairs or ramp for entrance;Assistance with cooking/housework   Can travel by private vehicle   Yes    Equipment Recommendations None recommended by PT  Recommendations for Other Services       Functional Status Assessment Patient has had a recent decline in their functional status and demonstrates the ability to make significant improvements in function in a reasonable and predictable amount of time.     Precautions / Restrictions Precautions Precautions: Fall Recall of Precautions/Restrictions: Intact Restrictions Weight Bearing Restrictions Per Provider Order: No      Mobility  Bed Mobility Overal bed mobility: Needs Assistance Bed Mobility: Rolling, Sidelying to Sit Rolling: Min assist Sidelying to sit: Min assist       General bed mobility comments: fair/good return for rolling to side and sitting up from side lying position    Transfers Overall transfer level: Needs assistance Equipment used: Rolling walker (2 wheels) Transfers: Sit to/from Stand, Bed  to chair/wheelchair/BSC Sit to Stand: Min assist   Step pivot transfers: Min assist       General transfer comment: increased time, labored movement    Ambulation/Gait Ambulation/Gait assistance: Min assist Gait Distance (Feet): 35 Feet Assistive device: Rolling walker (2  wheels) Gait Pattern/deviations: Decreased step length - right, Decreased step length - left, Decreased stride length Gait velocity: decreased     General Gait Details: slow labored movement without loss of balance, limited mostly due to fatigue and mild increase in back pain  Stairs            Wheelchair Mobility     Tilt Bed    Modified Rankin (Stroke Patients Only)       Balance Overall balance assessment: Needs assistance Sitting-balance support: Feet supported, No upper extremity supported Sitting balance-Leahy Scale: Fair Sitting balance - Comments: fair/good seated at EOB   Standing balance support: Reliant on assistive device for balance, During functional activity, Bilateral upper extremity supported Standing balance-Leahy Scale: Fair Standing balance comment: using RW                             Pertinent Vitals/Pain Pain Assessment Pain Assessment: Faces Faces Pain Scale: Hurts a little bit Pain Location: low back Pain Descriptors / Indicators: Sore Pain Intervention(s): Limited activity within patient's tolerance, Monitored during session, Repositioned    Home Living Family/patient expects to be discharged to:: Private residence Living Arrangements: Alone Available Help at Discharge: Family;Friend(s);Available PRN/intermittently Type of Home: House Home Access: Level entry       Home Layout: One level Home Equipment: Agricultural consultant (2 wheels);Wheelchair - manual;Grab bars - tub/shower;Shower seat      Prior Function Prior Level of Function : Independent/Modified Independent             Mobility Comments: household and short distanced community using RW ADLs Comments: Independent     Extremity/Trunk Assessment   Upper Extremity Assessment Upper Extremity Assessment: Generalized weakness    Lower Extremity Assessment Lower Extremity Assessment: Generalized weakness    Cervical / Trunk Assessment Cervical / Trunk  Assessment: Kyphotic  Communication   Communication Communication: No apparent difficulties    Cognition Arousal: Alert Behavior During Therapy: WFL for tasks assessed/performed   PT - Cognitive impairments: No apparent impairments                         Following commands: Intact       Cueing Cueing Techniques: Verbal cues, Tactile cues     General Comments      Exercises     Assessment/Plan    PT Assessment Patient needs continued PT services  PT Problem List Decreased strength;Decreased activity tolerance;Decreased balance;Decreased mobility       PT Treatment Interventions DME instruction;Gait training;Stair training;Functional mobility training;Therapeutic activities;Therapeutic exercise;Balance training;Patient/family education    PT Goals (Current goals can be found in the Care Plan section)  Acute Rehab PT Goals Patient Stated Goal: return home after rehab PT Goal Formulation: With patient Time For Goal Achievement: 03/24/24 Potential to Achieve Goals: Good    Frequency Min 3X/week     Co-evaluation               AM-PAC PT 6 Clicks Mobility  Outcome Measure Help needed turning from your back to your side while in a flat bed without using bedrails?: A Little Help needed moving from lying on your back to  sitting on the side of a flat bed without using bedrails?: A Little Help needed moving to and from a bed to a chair (including a wheelchair)?: A Little Help needed standing up from a chair using your arms (e.g., wheelchair or bedside chair)?: A Little Help needed to walk in hospital room?: A Little Help needed climbing 3-5 steps with a railing? : A Lot 6 Click Score: 17    End of Session   Activity Tolerance: Patient tolerated treatment well;Patient limited by fatigue Patient left: in chair;with call bell/phone within reach;with chair alarm set Nurse Communication: Mobility status PT Visit Diagnosis: Unsteadiness on feet  (R26.81);Other abnormalities of gait and mobility (R26.89);Muscle weakness (generalized) (M62.81)    Time: 1501-1530 PT Time Calculation (min) (ACUTE ONLY): 29 min   Charges:   PT Evaluation $PT Eval Moderate Complexity: 1 Mod PT Treatments $Therapeutic Activity: 23-37 mins PT General Charges $$ ACUTE PT VISIT: 1 Visit         3:54 PM, 03/10/24 Lynwood Music, MPT Physical Therapist with [facility] [phone] office [phone] mobile phone

## 2024-03-10 NOTE — ED Notes (Signed)
 Patients oxygen level flucatates between 88-92%  Patient complaining of freezing

## 2024-03-10 NOTE — ED Notes (Signed)
 Transported to xray

## 2024-03-10 NOTE — Plan of Care (Signed)
  Problem: Acute Rehab PT Goals(only PT should resolve) Goal: Pt Will Go Supine/Side To Sit Outcome: Progressing Flowsheets (Taken 03/10/2024 1556) Pt will go Supine/Side to Sit:  with contact guard assist  with minimal assist Goal: Patient Will Transfer Sit To/From Stand Outcome: Progressing Flowsheets (Taken 03/10/2024 1556) Patient will transfer sit to/from stand:  with contact guard assist  with minimal assist Goal: Pt Will Transfer Bed To Chair/Chair To Bed Outcome: Progressing Flowsheets (Taken 03/10/2024 1556) Pt will Transfer Bed to Chair/Chair to Bed:  with contact guard assist  with min assist Goal: Pt Will Ambulate Outcome: Progressing Flowsheets (Taken 03/10/2024 1556) Pt will Ambulate:  50 feet  with contact guard assist  with minimal assist  with rolling walker   3:56 PM, 03/10/24 Lynwood Music, MPT Physical Therapist with [facility] [phone] office [phone] mobile phone

## 2024-03-10 NOTE — Hospital Course (Signed)
 89 year old female OA of knee, endometrial cancer s/p TAH, cognitive impairment (MoCA 21/30) presents with a mechanical fall.  The patient was in her recliner.  She stood up with her walker and the walker slid out from under her.  She fell and believes she hit her head and right side.  Fortunately, the patient had her cell phone.  She contacted her neighbor who came to help her.  EMS was activated. Prior to the fall, the patient had been in her usual state of health without any significant complaints.  Patient denies fevers, chills, headache, chest pain, dyspnea, nausea, vomiting, diarrhea, abdominal pain, dysuria, hematuria, hematochezia, and melena. The patient had been living with her daughter who was helping her.  Unfortunately, her daughter has been hospitalized with a medical illness, and the patient has been alone for over a week. The patient denies any dizziness, syncope.  She denies any radicular type symptoms.  She denies any dysesthesias in her upper or lower extremities.  She denies any bowel or bladder incontinence.  She has low back pain but this is positional since her fall.  She denies any visual or speech disturbance. In the ED, the patient was afebrile and hemodynamically stable with oxygen saturation 92% on room air.  WBC 10.3, hemoglobin 13.9, platelet 271.  Sodium 141, potassium 3.3, bicarbonate 23, serum creatinine 0.79.  EKG showed sinus rhythm with no ST ST wave changes.  UA showed 21-50WBC CT lumbar spine compression fracture, age-indeterminate.  There is mild retropulsion L5.  There is no fracture or dislocation.  There is foraminal stenosis L4-5.  CT of the cervical spine was negative for traumatic injury.  CT brain was negative. In the ED, the patient had difficulty getting up TM.  As result, patient was admitted for further evaluation and treatment.

## 2024-03-10 NOTE — ED Notes (Signed)
 Reports she just finished anitbiotics for a UTI.

## 2024-03-10 NOTE — TOC Progression Note (Addendum)
 Transition of Care [facility]) - Progression Note    Patient Details  Name: Brittany Mahoney MRN: [id number] Date of Birth: [birth year]  Transition of Care [facility]) CM/SW Contact  Sharlyne Stabs, RN Phone Number: 03/10/2024, 3:41 PM  Clinical Narrative:   Patient being admitted, lives alone, PT eval pending, Medicare in OBS, [facility] Active. Patient has been to [facility] in the past. TOC following.    Expected Discharge Plan: Skilled Nursing Facility Barriers to Discharge: Continued Medical Work up  Expected Discharge Plan and Services    Social Determinants of Health (SDOH) Interventions SDOH Screenings   Food Insecurity: No Food Insecurity (03/10/2024)  Housing: Low Risk  (03/10/2024)  Transportation Needs: No Transportation Needs (03/10/2024)  Utilities: Not At Risk (03/10/2024)  Alcohol Screen: Low Risk  (10/23/2023)  Depression (PHQ2-9): Low Risk  (10/23/2023)  Financial Resource Strain: Low Risk  (10/23/2023)  Physical Activity: Inactive (10/23/2023)  Social Connections: Socially Isolated (03/10/2024)  Stress: No Stress Concern Present (10/23/2023)  Tobacco Use: Low Risk  (03/10/2024)  Health Literacy: Adequate Health Literacy (10/23/2023)    Readmission Risk Interventions     No data to display

## 2024-03-10 NOTE — H&P (Signed)
 History and Physical    Patient: Brittany Mahoney FMW:[id number] DOB: [birth year] DOA: 03/10/2024 DOS: the patient was seen and examined on 03/10/2024 PCP: [name], MD  Patient coming from: Home  Chief Complaint:  Chief Complaint  Patient presents with   Fall   HPI: Brittany Mahoney is a 89 year old female OA of knee, endometrial cancer s/p TAH, cognitive impairment (MoCA 21/30) presents with a mechanical fall.  The patient was in her recliner.  She stood up with her walker and the walker slid out from under her.  She fell and believes she hit her head and right side.  Fortunately, the patient had her cell phone.  She contacted her neighbor who came to help her.  EMS was activated. Prior to the fall, the patient had been in her usual state of health without any significant complaints.  Patient denies fevers, chills, headache, chest pain, dyspnea, nausea, vomiting, diarrhea, abdominal pain, dysuria, hematuria, hematochezia, and melena. The patient had been living with her daughter who was helping her.  Unfortunately, her daughter has been hospitalized with a medical illness, and the patient has been alone for over a week. The patient denies any dizziness, syncope.  She denies any radicular type symptoms.  She denies any dysesthesias in her upper or lower extremities.  She denies any bowel or bladder incontinence.  She has low back pain but this is positional since her fall.  She denies any visual or speech disturbance. In the ED, the patient was afebrile and hemodynamically stable with oxygen saturation 92% on room air.  WBC 10.3, hemoglobin 13.9, platelet 271.  Sodium 141, potassium 3.3, bicarbonate 23, serum creatinine 0.79.  EKG showed sinus rhythm with no ST ST wave changes.  UA showed 21-50WBC CT lumbar spine compression fracture, age-indeterminate.  There is mild retropulsion L5.  There is no fracture or dislocation.  There is foraminal stenosis L4-5.  CT of the cervical spine was negative for  traumatic injury.  CT brain was negative. In the ED, the patient had difficulty getting up TM.  As result, patient was admitted for further evaluation and treatment.  Review of Systems: As mentioned in the history of present illness. All other systems reviewed and are negative. Past Medical History:  Diagnosis Date   Age-related osteoporosis without current pathological fracture    Arthritis    Arthropathy, unspecified    Broken hip (HCC)    Cancer (HCC)    uterus   Chronic kidney disease, stage 1    Closed right hip fracture (HCC) 04/09/2016   Endometrial cancer, grade I (HCC) 03/21/2015   Fracture of unspecified part of neck of right femur, sequela    Glaucoma    Headache(784.0)    migraines after periods   Hepatitis    Hx of cancer of endometrium 03/06/2013   Osteoporosis    Other abnormalities of gait and mobility    Other amnesia    Pain in left knee    Pain in right foot    Pain in right shoulder    Personal history of (healed) traumatic fracture    PMB (postmenopausal bleeding)    Repeated falls    Tremor, unspecified    Past Surgical History:  Procedure Laterality Date   ABDOMINAL HYSTERECTOMY  2007   TAH and BSO   COLONOSCOPY  07/31/2006   Dr.Rourk   DILATION AND CURETTAGE OF UTERUS     HIP FRACTURE SURGERY     INTRAMEDULLARY (IM) NAIL INTERTROCHANTERIC Right 04/10/2016  Procedure: INTRAMEDULLARY (IM) NAIL RIGHT HIP FRACTURE;  Surgeon: Lonni CINDERELLA Poli, MD;  Location: [facility] OR;  Service: Orthopedics;  Laterality: Right;   Social History:  reports that she has never smoked. She has never used smokeless tobacco. She reports that she does not drink alcohol and does not use drugs.  Allergies  Allergen Reactions   Penicillins Hives and Swelling    Has patient had a PCN reaction causing immediate rash, facial/tongue/throat swelling, SOB or lightheadedness with hypotension: Yes Has patient had a PCN reaction causing severe rash involving mucus membranes or skin  necrosis: No Has patient had a PCN reaction that required hospitalization No Has patient had a PCN reaction occurring within the last 10 years: No  If all of the above answers are NO, then may proceed with Cephalosporin use.    Codeine    Erythromycin    Morphine  And Codeine Other (See Comments)    loopy    Family History  Problem Relation Age of Onset   Stroke Maternal Grandfather    Stroke Paternal Grandfather    Cancer Mother    Cancer Father        lymphatic sarcoma   Early death Sister    Early death Son    Diabetes Son    Heart disease Other    Cancer Other    Tuberculosis Other    Diabetes Other    Thyroid  disease Other    Stroke Other    Heart disease Maternal Aunt    Heart disease Maternal Uncle     Prior to Admission medications   Medication Sig Start Date End Date Taking? Authorizing Provider  bacitracin-polymyxin b (POLYSPORIN) ophthalmic ointment Place 1 Application into both eyes at bedtime. 08/29/23   [provider]  Calcium Carb-Cholecalciferol (CALTRATE 600+D3 PO) Take by mouth.    [provider]  celecoxib  (CELEBREX ) 100 MG capsule Take 1 capsule (100 mg total) by mouth 2 (two) times daily as needed for mild pain or moderate pain. 05/04/22   [name], MD  Cholecalciferol 1.25 MG (50000 UT) TABS Take 5,000 Int'l Units by mouth daily.    [provider]  Cranberry 500 MG CAPS Take 500 mg by mouth daily.    [provider]  LUMIGAN 0.01 % SOLN INSTILL 1 DROP IN INTO BOTH EYES AT BEDTIME 02/07/16   [provider]  NONFORMULARY OR COMPOUNDED ITEM Antifungal solution: Terbinafine 3%, Fluconazole 2%, Tea Tree Oil 5%, Urea 10%, Ibuprofen 2% in DMSO suspension #30mL 02/12/20   [place], Delon CROME, DPM  Nutritional Supplements (DHEA PO) Take 15 mg by mouth every morning.    [provider]    Physical Exam: Vitals:   03/10/24 1000 03/10/24 1010 03/10/24 1030 03/10/24 1039  BP: (!) 149/78  (!) 149/81    Pulse: 75 80 90   Resp: 20 19 19    Temp:    97.7 F (36.5 C)  TempSrc:    Oral  SpO2: 96% (!) 89% 92%   Weight:      Height:       GENERAL:  A&O x 3, NAD, well developed, cooperative, follows commands HEENT: [place]/AT, No thrush, No icterus, No oral ulcers Neck:  No neck mass, No meningismus, soft, supple CV: RRR, no S3, no S4, no rub, no JVD Lungs:  CTA, no wheeze, no rhonchi, good air movement Abd: soft/NT +BS, nondistended Ext: No edema, no lymphangitis, no cyanosis, no rashes Neuro:  CN II-XII intact, strength 4/5 in RUE, RLE,  strength 4/5 LUE, LLE; sensation intact bilateral; no dysmetria; babinski equivocal   Data Reviewed: Data reviewed above in the history  Assessment and Plan: Lumbar compression fracture - At baseline, the patient ambulates with a walker - Now she has having difficulty getting up and transferring - Judicious opioids - PT evaluation - X-ray of the pelvis negative for fracture - CT for cervical spine and brain negative for traumatic injury but did show right scalp hematoma  UTI - UA shows 21- 50 WBC with nitrites - Start empiric ceftriaxone per culture data  Hypokalemia - Replete  Fall/gait stability- -PT evaluation -CK   Advance Care Planning: full code  Consults: none  Family Communication: none  Severity of Illness: The appropriate patient status for this patient is OBSERVATION. Observation status is judged to be reasonable and necessary in order to provide the required intensity of service to ensure the patient's safety. The patient's presenting symptoms, physical exam findings, and initial radiographic and laboratory data in the context of their medical condition is felt to place them at decreased risk for further clinical deterioration. Furthermore, it is anticipated that the patient will be medically stable for discharge from the hospital within 2 midnights of admission.   Author: Alm Schneider, MD 03/10/2024 11:53 AM  For on call review  [email or web address].

## 2024-03-10 NOTE — ED Provider Notes (Signed)
 [place] EMERGENCY DEPARTMENT AT [facility] Provider Note   CSN: [id number] Arrival date & time: 03/10/24  9349     Patient presents with: [place]   Brittany Mahoney is a 89 y.o. female.   Patient c/o fall this AM. Indicates had slept in recliner, had stood up with walker, and walker slid out, and she fell. Denies loc. Post fall, c/o low back pain. No saddle area or leg numbness. No weakness. No change in normal bowel or bladder habits,, other than urgency. No radicular pain. Back pain dull, worse w certain movements, positional changes. Pt had her cell phone and was able to call neighbor and EMS was called. Indicates felt fine last night. Denies new numbness or weakness. No change in speech or vision. No anticoagulant use. No faintness or dizziness.   The history is provided by the patient, the EMS personnel and medical records. The history is limited by the condition of the patient.  Fall Pertinent negatives include no chest pain, no abdominal pain, no headaches and no shortness of breath.       Prior to Admission medications   Medication Sig Start Date End Date Taking? Authorizing Provider  bacitracin-polymyxin b (POLYSPORIN) ophthalmic ointment Place 1 Application into both eyes at bedtime. 08/29/23   [provider]  Calcium Carb-Cholecalciferol (CALTRATE 600+D3 PO) Take by mouth.    [provider]  celecoxib  (CELEBREX ) 100 MG capsule Take 1 capsule (100 mg total) by mouth 2 (two) times daily as needed for mild pain or moderate pain. 05/04/22   [name], MD  Cholecalciferol 1.25 MG (50000 UT) TABS Take 5,000 Int'l Units by mouth daily.    [provider]  Cranberry 500 MG CAPS Take 500 mg by mouth daily.    [provider]  LUMIGAN 0.01 % SOLN INSTILL 1 DROP IN INTO BOTH EYES AT BEDTIME 02/07/16   [provider]  NONFORMULARY OR COMPOUNDED ITEM Antifungal solution: Terbinafine 3%, Fluconazole 2%, Tea Tree Oil 5%, Urea 10%,  Ibuprofen 2% in DMSO suspension #30mL 02/12/20   [place], Delon CROME, DPM  Nutritional Supplements (DHEA PO) Take 15 mg by mouth every morning.    [provider]    Allergies: Penicillins, Codeine, Erythromycin, and Morphine  and codeine    Review of Systems  Constitutional:  Negative for chills and fever.  HENT:  Negative for sore throat.   Eyes:  Negative for visual disturbance.  Respiratory:  Negative for cough and shortness of breath.   Cardiovascular:  Negative for chest pain, palpitations and leg swelling.  Gastrointestinal:  Negative for abdominal pain, blood in stool, nausea and vomiting.  Genitourinary:  Negative for dysuria and flank pain.       Hs utis  Musculoskeletal:  Positive for back pain. Negative for neck pain.  Skin:  Negative for wound.  Neurological:  Negative for syncope, speech difficulty, weakness, numbness and headaches.    Updated Vital Signs BP (!) 148/73   Pulse 85   Temp 98.3 F (36.8 C) (Oral)   Resp (!) 21   Ht 1.651 m (5' 5)   Wt 59 kg   SpO2 93%   BMI 21.63 kg/m   Physical Exam Vitals and nursing note reviewed.  Constitutional:      Appearance: Normal appearance. She is well-developed.  HENT:     Head:     Comments: Contusion scalp    Nose: Nose normal.     Mouth/Throat:     Mouth: Mucous membranes  are moist.   Eyes:     General: No scleral icterus.    Conjunctiva/sclera: Conjunctivae normal.     Pupils: Pupils are equal, round, and reactive to light.   Neck:     Trachea: No tracheal deviation.   Cardiovascular:     Rate and Rhythm: Normal rate and regular rhythm.     Pulses: Normal pulses.     Heart sounds: Normal heart sounds. No murmur heard.    No friction rub. No gallop.  Pulmonary:     Effort: Pulmonary effort is normal. No respiratory distress.     Breath sounds: Normal breath sounds.  Abdominal:     General: Bowel sounds are normal. There is no distension.     Palpations: Abdomen is soft. There is no mass.      Tenderness: There is no abdominal tenderness. There is no guarding.  Genitourinary:    Comments: No cva tenderness.   Musculoskeletal:        General: No swelling.     Cervical back: Normal range of motion and neck supple. No rigidity. No muscular tenderness.     Comments: Lumbar tenderness, otherwise, CTLS spine, non tender, aligned, no step off. Good passive rom bil extremities without pain or focal bony tenderness. Distal pulses palp bil extremities.    Skin:    General: Skin is warm and dry.     Findings: No rash.   Neurological:     Mental Status: She is alert.     Comments: Alert, speech normal. GCS 15. Motor/sens grossly intact bil. Stre 5/5. Sens intact.   Psychiatric:        Mood and Affect: Mood normal.     (all labs ordered are listed, but only abnormal results are displayed) Results for orders placed or performed during the hospital encounter of 03/10/24  CBC   Collection Time: 03/10/24  7:27 AM  Result Value Ref Range   WBC 10.3 4.0 - 10.5 K/uL   RBC 4.48 3.87 - 5.11 MIL/uL   Hemoglobin 13.9 12.0 - 15.0 g/dL   HCT 58.1 63.9 - 53.9 %   MCV 93.3 80.0 - 100.0 fL   MCH 31.0 26.0 - 34.0 pg   MCHC 33.3 30.0 - 36.0 g/dL   RDW 87.5 88.4 - 84.4 %   Platelets 271 150 - 400 K/uL   nRBC 0.0 0.0 - 0.2 %  Basic metabolic panel with GFR   Collection Time: 03/10/24  7:27 AM  Result Value Ref Range   Sodium 141 135 - 145 mmol/L   Potassium 3.3 (L) 3.5 - 5.1 mmol/L   Chloride 107 98 - 111 mmol/L   CO2 23 22 - 32 mmol/L   Glucose, Bld 112 (H) 70 - 99 mg/dL   BUN 14 8 - 23 mg/dL   Creatinine, Ser 9.20 0.44 - 1.00 mg/dL   Calcium 8.7 (L) 8.9 - 10.3 mg/dL   GFR, Estimated >39 >39 mL/min   Anion gap 11 5 - 15  Urinalysis, Routine w reflex microscopic -Urine, Catheterized   Collection Time: 03/10/24  7:44 AM  Result Value Ref Range   Color, Urine YELLOW YELLOW   APPearance HAZY (A) CLEAR   Specific Gravity, Urine 1.009 1.005 - 1.030   pH 7.0 5.0 - 8.0   Glucose, UA  NEGATIVE NEGATIVE mg/dL   Hgb urine dipstick SMALL (A) NEGATIVE   Bilirubin Urine NEGATIVE NEGATIVE   Ketones, ur NEGATIVE NEGATIVE mg/dL   Protein, ur NEGATIVE NEGATIVE mg/dL   Nitrite  POSITIVE (A) NEGATIVE   Leukocytes,Ua LARGE (A) NEGATIVE   RBC / HPF 6-10 0 - 5 RBC/hpf   WBC, UA 21-50 0 - 5 WBC/hpf   Bacteria, UA RARE (A) NONE SEEN   Squamous Epithelial / HPF 0-5 0 - 5 /HPF   Budding Yeast PRESENT      EKG: EKG Interpretation Date/Time:  Tuesday March 10 2024 07:29:10 EDT Ventricular Rate:  81 PR Interval:  151 QRS Duration:  102 QT Interval:  391 QTC Calculation: 454 R Axis:   38  Text Interpretation: Sinus rhythm Confirmed by Bernard Drivers ([id number]) on 03/10/2024 7:36:45 AM  Radiology: CT Cervical Spine Wo Contrast Result Date: 03/10/2024 CLINICAL DATA:  89 year old female with pain radiating down the left leg after a fall this morning. EXAM: CT CERVICAL SPINE WITHOUT CONTRAST TECHNIQUE: Multidetector CT imaging of the cervical spine was performed without intravenous contrast. Multiplanar CT image reconstructions were also generated. RADIATION DOSE REDUCTION: This exam was performed according to the departmental dose-optimization program which includes automated exposure control, adjustment of the mA and/or kV according to patient size and/or use of iterative reconstruction technique. COMPARISON:  Head CT today. FINDINGS: Alignment: Degenerative appearing anterolisthesis of C4 on C5 and C7 on T1. Underlying levoconvex cervical scoliosis, mild straightening and reversal of the normal cervical lordosis. Maintained bilateral posterior element alignment. Skull base and vertebrae: Bone mineralization is within normal limits for age. Visualized skull base is intact. No atlanto-occipital dissociation. C1 and C2 appear intact and aligned. No acute osseous abnormality identified. Soft tissues and spinal canal: No prevertebral fluid or swelling. No visible canal hematoma. Negative visible  noncontrast neck soft tissues. Disc levels: Advanced cervical spine degeneration superimposed on degenerative ankylosis of the right C2-C3 and C4-C5 facets. Bulky cervical disc and endplate degeneration is widespread. Cervical spinal stenosis at C5-C6 and C6-C7 is moderate to severe by CT. Upper chest: Visible upper thoracic levels appear intact. Asymmetric sclerotic deformity of the head of the visible left clavicle, could be degenerative or posttraumatic. Negative lung apices. IMPRESSION: 1. No acute traumatic injury identified in the cervical spine. 2. Advanced cervical spine degeneration superimposed on multilevel spondylolisthesis and degenerative facet ankylosis at C2-C3 and C4-C5. Moderate to severe multifactorial spinal stenosis suspected at C5-C6 and C6-C7. Electronically Signed   By: VEAR Hurst M.D.   On: 03/10/2024 08:57   CT Lumbar Spine Wo Contrast Result Date: 03/10/2024 CLINICAL DATA:  89 year old female with pain radiating down the left leg after a fall this morning. EXAM: CT LUMBAR SPINE WITHOUT CONTRAST TECHNIQUE: Multidetector CT imaging of the lumbar spine was performed without intravenous contrast administration. Multiplanar CT image reconstructions were also generated. RADIATION DOSE REDUCTION: This exam was performed according to the departmental dose-optimization program which includes automated exposure control, adjustment of the mA and/or kV according to patient size and/or use of iterative reconstruction technique. COMPARISON:  CT Abdomen and Pelvis 04/22/2006. Lumbar radiographs 08/01/2018. FINDINGS: Segmentation: Normal. Alignment: Levoconvex upper and dextroconvex lower lumbar scoliosis, moderate. Mildly exaggerated lower lumbar lordosis. Grade 1 anterolisthesis of L4 on L5 measuring about 5 mm. Vertebrae: L5 superior endplate compression is new since 2019, but also appears partially sclerotic. L5 loss of height is 35-40%. Mild retropulsion of the posterosuperior endplate. L5 posterior  elements appear intact and aligned. Vertebral osteopenia elsewhere. No other convincing lumbar endplate compression fracture. Chronic posterior left 12th rib fracture. Intact visible sacrum and SI joints. No other acute osseous abnormality. Paraspinal and other soft tissues: Chronic cholelithiasis. Calcified aortic atherosclerosis. Normal caliber abdominal  aorta. Negative other visible noncontrast abdominal viscera. Lumbar paraspinal soft tissues are within normal limits. Disc levels: Chronic lumbar spine degeneration including levels of advanced disc and facet disease with vacuum disc and vacuum facet phenomena. Multifactorial lumbar spinal stenosis by CT is severe at L4-L5 (series 10, image 77) with severe bilateral lateral recess and left foraminal stenosis. IMPRESSION: 1. L5 superior endplate compression fracture is new since 2019 but age indeterminate. If specific therapy such as vertebroplasty is desired, Lumbar MRI or Nuclear Medicine Whole-body Bone Scan would best determine acuity. 2. Mild retropulsion of the L5 posterosuperior endplate is superimposed on degenerative grade 1 spondylolisthesis at L4-L5, advanced disc and facet degeneration there. Subsequent severe spinal, lateral recess, left foraminal stenosis. Query left L4 and/or L5 radiculitis. 3. No other acute osseous abnormality identified. Aortic Atherosclerosis (ICD10-I70.0). Electronically Signed   By: VEAR Hurst M.D.   On: 03/10/2024 08:54   CT Head Wo Contrast Result Date: 03/10/2024 CLINICAL DATA:  89 year old female with pain radiating down the left leg after a fall this morning. EXAM: CT HEAD WITHOUT CONTRAST TECHNIQUE: Contiguous axial images were obtained from the base of the skull through the vertex without intravenous contrast. RADIATION DOSE REDUCTION: This exam was performed according to the departmental dose-optimization program which includes automated exposure control, adjustment of the mA and/or kV according to patient size and/or  use of iterative reconstruction technique. COMPARISON:  Head CT 08/15/2016. FINDINGS: Brain: Cerebral volume not significantly changed since 2017. No midline shift, ventriculomegaly, mass effect, evidence of mass lesion, intracranial hemorrhage or evidence of cortically based acute infarction. Chronic but progressed, now confluent bilateral cerebral white matter hypodensity. Vascular: Calcified atherosclerosis at the skull base. No suspicious intracranial vascular hyperdensity. Skull: Intact. No acute osseous abnormality identified. Chronic TMJ degeneration. Sinuses/Orbits: Stable paranasal sinuses and mastoids, well aerated. Other: Right vertex mild scalp hematoma or contusion series 505, image 78. No scalp soft tissue gas. Underlying calvarium appears intact. No other acute orbit or scalp soft tissue injury identified. IMPRESSION: 1. Right vertex scalp hematoma or contusion. No skull fracture. 2. No acute intracranial abnormality. Progressed chronic white matter disease since 2017. Electronically Signed   By: VEAR Hurst M.D.   On: 03/10/2024 08:42     Procedures   Medications Ordered in the ED  morphine  (PF) 2 MG/ML injection 2 mg (has no administration in time range)  ondansetron  (ZOFRAN ) injection 4 mg (has no administration in time range)  potassium chloride  (KLOR-CON ) packet 40 mEq (has no administration in time range)  lactated ringers  bolus 1,000 mL (has no administration in time range)  cefTRIAXone (ROCEPHIN) 1 g in sodium chloride  0.9 % 100 mL IVPB (has no administration in time range)                                    Medical Decision Making Problems Addressed: Accidental fall, initial encounter: acute illness or injury with systemic symptoms that poses a threat to life or bodily functions Acute UTI: acute illness or injury with systemic symptoms that poses a threat to life or bodily functions Closed compression fracture of L5 vertebra, initial encounter (HCC): acute illness or injury  with systemic symptoms that poses a threat to life or bodily functions Generalized weakness: acute illness or injury with systemic symptoms that poses a threat to life or bodily functions Hypokalemia: acute illness or injury  Amount and/or Complexity of Data Reviewed Independent Historian: EMS  Details: hx External Data Reviewed: notes. Labs: ordered. Decision-making details documented in ED Course. Radiology: ordered and independent interpretation performed. Decision-making details documented in ED Course. ECG/medicine tests: ordered and independent interpretation performed. Decision-making details documented in ED Course. Discussion of management or test interpretation with external provider(s): hospitalists  Risk Prescription drug management. Decision regarding hospitalization.   Iv ns. Continuous pulse ox and cardiac monitoring. Labs ordered/sent. Imaging ordered.   Differential diagnosis includes head injury, back/neck injury, anemia, dehydration, etc. Dispo decision including potential need for admission considered - will get labs and imaging and reassess.   Reviewed nursing notes and prior charts for additional history. External reports reviewed. Additional history from: EMS.   Cardiac monitor: sinus rhythm, rate 80.  Labs reviewed/interpreted by me - wbc and hgb normal. K mildly low, kcl po. Ua w possible uti, 21-50 wbc, hx uti, cx sent. Ivf, iv abx.   CT reviewed/interpreted by me - no hem. No c spine fx. Lumbar compression fx with mild retropulsion. No saddle area or leg numbness. Bilateral lower extremities nvi.   Morphine  2 mg iv. Zofran  iv.   Pain improved w meds, but not able to stand/ambulate due to pain.   Given fall, weakness, compression fx, symptomatic uti, inability to ambulate - will consult hospitalists for admission.         Final diagnoses:  None    ED Discharge Orders     None          Bernard Drivers, MD 03/10/24 1112

## 2024-03-11 DIAGNOSIS — Z79899 Other long term (current) drug therapy: Secondary | ICD-10-CM | POA: Diagnosis not present

## 2024-03-11 DIAGNOSIS — M48061 Spinal stenosis, lumbar region without neurogenic claudication: Secondary | ICD-10-CM | POA: Diagnosis present

## 2024-03-11 DIAGNOSIS — W19XXXA Unspecified fall, initial encounter: Secondary | ICD-10-CM

## 2024-03-11 DIAGNOSIS — Z881 Allergy status to other antibiotic agents status: Secondary | ICD-10-CM | POA: Diagnosis not present

## 2024-03-11 DIAGNOSIS — Z90710 Acquired absence of both cervix and uterus: Secondary | ICD-10-CM | POA: Diagnosis not present

## 2024-03-11 DIAGNOSIS — N181 Chronic kidney disease, stage 1: Secondary | ICD-10-CM | POA: Diagnosis present

## 2024-03-11 DIAGNOSIS — H409 Unspecified glaucoma: Secondary | ICD-10-CM | POA: Diagnosis present

## 2024-03-11 DIAGNOSIS — E876 Hypokalemia: Secondary | ICD-10-CM | POA: Diagnosis present

## 2024-03-11 DIAGNOSIS — Z880 Allergy status to penicillin: Secondary | ICD-10-CM | POA: Diagnosis not present

## 2024-03-11 DIAGNOSIS — S0003XA Contusion of scalp, initial encounter: Secondary | ICD-10-CM | POA: Diagnosis present

## 2024-03-11 DIAGNOSIS — R531 Weakness: Secondary | ICD-10-CM | POA: Diagnosis not present

## 2024-03-11 DIAGNOSIS — Y92009 Unspecified place in unspecified non-institutional (private) residence as the place of occurrence of the external cause: Secondary | ICD-10-CM | POA: Diagnosis not present

## 2024-03-11 DIAGNOSIS — Z8542 Personal history of malignant neoplasm of other parts of uterus: Secondary | ICD-10-CM | POA: Diagnosis not present

## 2024-03-11 DIAGNOSIS — S32050A Wedge compression fracture of fifth lumbar vertebra, initial encounter for closed fracture: Secondary | ICD-10-CM

## 2024-03-11 DIAGNOSIS — M545 Low back pain, unspecified: Secondary | ICD-10-CM | POA: Diagnosis present

## 2024-03-11 DIAGNOSIS — M810 Age-related osteoporosis without current pathological fracture: Secondary | ICD-10-CM | POA: Diagnosis present

## 2024-03-11 DIAGNOSIS — N390 Urinary tract infection, site not specified: Secondary | ICD-10-CM | POA: Diagnosis present

## 2024-03-11 DIAGNOSIS — M4856XA Collapsed vertebra, not elsewhere classified, lumbar region, initial encounter for fracture: Secondary | ICD-10-CM | POA: Diagnosis present

## 2024-03-11 DIAGNOSIS — B9620 Unspecified Escherichia coli [E. coli] as the cause of diseases classified elsewhere: Secondary | ICD-10-CM | POA: Diagnosis present

## 2024-03-11 DIAGNOSIS — Z8249 Family history of ischemic heart disease and other diseases of the circulatory system: Secondary | ICD-10-CM | POA: Diagnosis not present

## 2024-03-11 DIAGNOSIS — M1990 Unspecified osteoarthritis, unspecified site: Secondary | ICD-10-CM | POA: Diagnosis present

## 2024-03-11 DIAGNOSIS — Z885 Allergy status to narcotic agent status: Secondary | ICD-10-CM | POA: Diagnosis not present

## 2024-03-11 DIAGNOSIS — W1839XA Other fall on same level, initial encounter: Secondary | ICD-10-CM | POA: Diagnosis present

## 2024-03-11 LAB — BASIC METABOLIC PANEL WITH GFR
Anion gap: 5 (ref 5–15)
BUN: 12 mg/dL (ref 8–23)
CO2: 25 mmol/L (ref 22–32)
Calcium: 8.9 mg/dL (ref 8.9–10.3)
Chloride: 109 mmol/L (ref 98–111)
Creatinine, Ser: 0.84 mg/dL (ref 0.44–1.00)
GFR, Estimated: >60 mL/min (ref >60)
Glucose, Bld: 97 mg/dL (ref 70–99)
Potassium: 3.7 mmol/L (ref 3.5–5.1)
Sodium: 139 mmol/L (ref 135–145)

## 2024-03-11 LAB — CBC
HCT: 42.1 % (ref 36.0–46.0)
Hemoglobin: 14.3 g/dL (ref 12.0–15.0)
MCH: 31.6 pg (ref 26.0–34.0)
MCHC: 34 g/dL (ref 30.0–36.0)
MCV: 93.1 fL (ref 80.0–100.0)
Platelets: 255 10*3/uL (ref 150–400)
RBC: 4.52 MIL/uL (ref 3.87–5.11)
RDW: 12.5 % (ref 11.5–15.5)
WBC: 7.4 10*3/uL (ref 4.0–10.5)
nRBC: 0 % (ref 0.0–0.2)

## 2024-03-11 LAB — MAGNESIUM: Magnesium: 2.2 mg/dL (ref 1.7–2.4)

## 2024-03-11 NOTE — TOC Progression Note (Signed)
 Transition of Care [facility]) - Progression Note    Patient Details  Name: Brittany Mahoney MRN: [id number] Date of Birth: 14-Oct-1934  Transition [facility]) CM/SW Contact  Hoy DELENA Bigness, LCSW Phone Number: 03/11/2024, 11:14 AM  Clinical Narrative:    Pt accepted offer for [facility].  Pt able to transfer to SNF under [facility] waiver.     Expected Discharge Plan: Skilled Nursing Facility Barriers to Discharge: Continued Medical Work up  Expected Discharge Plan and Services In-house Referral: Clinical Social Work Discharge Planning Services: NA Post Acute Care Choice: Skilled Nursing Facility Living arrangements for the past 2 months: Single Family Home                 DME Arranged: N/A DME Agency: NA                   Social Determinants of Health (SDOH) Interventions SDOH Screenings   Food Insecurity: No Food Insecurity (03/10/2024)  Housing: Low Risk  (03/10/2024)  Transportation Needs: No Transportation Needs (03/10/2024)  Utilities: Not At Risk (03/10/2024)  Alcohol Screen: Low Risk  (10/23/2023)  Depression (PHQ2-9): Low Risk  (10/23/2023)  Financial Resource Strain: Low Risk  (10/23/2023)  Physical Activity: Inactive (10/23/2023)  Social Connections: Socially Isolated (03/10/2024)  Stress: No Stress Concern Present (10/23/2023)  Tobacco Use: Low Risk  (03/10/2024)  Health Literacy: Adequate Health Literacy (10/23/2023)    Readmission Risk Interventions     No data to display

## 2024-03-11 NOTE — Plan of Care (Signed)
   Problem: Activity: Goal: Risk for activity intolerance will decrease Outcome: Progressing   Problem: Coping: Goal: Level of anxiety will decrease Outcome: Progressing   Problem: Safety: Goal: Ability to remain free from injury will improve Outcome: Progressing

## 2024-03-11 NOTE — Plan of Care (Signed)
   Problem: Education: Goal: Knowledge of General Education information will improve Description Including pain rating scale, medication(s)/side effects and non-pharmacologic comfort measures Outcome: Progressing   Problem: Health Behavior/Discharge Planning: Goal: Ability to manage health-related needs will improve Outcome: Progressing

## 2024-03-11 NOTE — NC FL2 (Signed)
 [place]  MEDICAID FL2 LEVEL OF CARE FORM     IDENTIFICATION  Patient Name: Brittany Mahoney Birthdate: 11-11-1934 Sex: female Admission Date (Current Location): 03/10/2024  [facility] and [place] Number:  Geophysical data processor and Address:  [facility],  [address], [place] [address]      Provider Number: [id number]  Attending Physician Name and Address:  Davia Nydia POUR, MD  Relative Name and Phone Number:  Dominick Jama Caldron (Other)  [phone]    Current Level of Care: Hospital Recommended Level of Care: Skilled Nursing Facility Prior Approval Number:    Date Approved/Denied:   PASRR Number: [id number]  Discharge Plan: SNF    Current Diagnoses: Patient Active Problem List   Diagnosis Date Noted   Lumbar compression fracture (HCC) 03/10/2024   Fall at home, initial encounter 03/10/2024   Hypokalemia 03/10/2024   Acute UTI 03/10/2024   Current mild episode of major depressive disorder (HCC) 11/05/2022   Elevated serum creatinine 11/05/2022   Physical deconditioning 11/03/2021   OA (osteoarthritis) of knee 06/23/2021   Osteoporosis    Other abnormalities of gait and mobility    Dementia (HCC)    Personal history of (healed) traumatic fracture    Repeated falls    Tremor, unspecified    History of uterine cancer 04/01/2017   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    Orientation RESPIRATION BLADDER Height & Weight     Self, Time, Situation, Place  Normal Continent Weight: 139 lb 8.8 oz (63.3 kg) Height:  5' 6.5 (168.9 cm)  BEHAVIORAL SYMPTOMS/MOOD NEUROLOGICAL BOWEL NUTRITION STATUS      Continent Diet (Regular)  AMBULATORY STATUS COMMUNICATION OF NEEDS Skin   Limited Assist Verbally Normal                       Personal Care Assistance Level of Assistance  Bathing, Feeding, Dressing Bathing Assistance: Limited assistance Feeding assistance: Independent Dressing Assistance: Limited assistance     Functional Limitations Info  Sight, Hearing,  Speech Sight Info: Impaired Financial trader) Hearing Info: Adequate Speech Info: Adequate    SPECIAL CARE FACTORS FREQUENCY  PT (By licensed PT), OT (By licensed OT)     PT Frequency: 5x/wk OT Frequency: 5x/wk            Contractures Contractures Info: Not present    Additional Factors Info  Code Status, Allergies Code Status Info: FULL Allergies Info: Penicillins, Codeine, Erythromycin, Morphine  And Codeine           Current Medications (03/11/2024):  This is the current hospital active medication list Current Facility-Administered Medications  Medication Dose Route Frequency Provider Last Rate Last Admin   acetaminophen  (TYLENOL ) tablet 650 mg  650 mg Oral Q6H PRN Tat, David, MD       Or   acetaminophen  (TYLENOL ) suppository 650 mg  650 mg Rectal Q6H PRN Tat, Alm, MD       acetaminophen  (TYLENOL ) tablet 1,000 mg  1,000 mg Oral Q8H Tat, Alm, MD   1,000 mg at 03/10/24 2116   cefTRIAXone (ROCEPHIN) 1 g in sodium chloride  0.9 % 100 mL IVPB  1 g Intravenous Q24H Evonnie Alm, MD 200 mL/hr at 03/11/24 0855 1 g at 03/11/24 0855   heparin injection 5,000 Units  5,000 Units Subcutaneous Q8H Tat, David, MD   5,000 Units at 03/11/24 0501   latanoprost  (XALATAN ) 0.005 % ophthalmic solution 1 drop  1 drop Both Eyes QHS Evonnie Alm, MD       ondansetron  (ZOFRAN )  tablet 4 mg  4 mg Oral Q6H PRN Tat, David, MD       Or   ondansetron  (ZOFRAN ) injection 4 mg  4 mg Intravenous Q6H PRN Tat, Alm, MD       oxyCODONE (Oxy IR/ROXICODONE) immediate release tablet 5 mg  5 mg Oral Q6H PRN Evonnie Alm, MD         Discharge Medications: Please see discharge summary for a list of discharge medications.  Relevant Imaging Results:  Relevant Lab Results:   Additional Information SSN: [id number]  Hoy DELENA Bigness, LCSW

## 2024-03-11 NOTE — Progress Notes (Signed)
 [facility]                                                                              Brittany Mahoney, is a 89 y.o. female, DOB - [birth year], FMW:[id number] Admit date - 03/10/2024    Outpatient Primary MD for the patient is [name], MD  LOS - 0  days  Chief Complaint  Patient presents with   Fall       Brief summary   Patient is a 89 year old female with OA, endometrial CA status post TAH cognitive impairment (MoCA 21/30) presents with a mechanical fall. The patient was in her recliner. She stood up with her walker and the walker slid out from under her. She fell and believes she hit her head and right side. Fortunately, the patient had her cell phone. She contacted her neighbor who came to help her. EMS was activated.  Patient has been living with her daughter who was helping her, unfortunately daughter has been hospitalized with medical illness and patient has been alone for over a week.  The patient reported low back pain, positional since her fall. In the ED, CT lumbar spine showed age-indeterminate L5 compression fracture with mild retropulsion UA positive for UTI  Assessment & Plan    Principal Problem:   Lumbar compression fracture (HCC) Active Problems:   Fall at home, initial encounter   Hypokalemia   Acute UTI  Mechanical fall with gait instability, lumbar compression fracture - At baseline, the patient ambulates with a walker - Now she has having difficulty getting up and transferring - PT evaluation recommended SNF - X-ray of the pelvis negative for fracture - CT for cervical spine and brain negative for traumatic injury but did show right scalp hematoma -Pain is controlled   Acute urinary tract infection - UA were positive for UTI, follow urine culture and sensitivities -Continue IV Rocephin   Hypokalemia - Replete as needed  History of endometrial CA status post TAH - Outpatient follow-up with onc   Estimated body mass index is  22.19 kg/m as calculated from the following:   Height as of this encounter: 5' 6.5 (1.689 m).   Weight as of this encounter: 63.3 kg.  Code Status:  DVT Prophylaxis:  heparin injection 5,000 Units Start: 03/10/24 2200   Level of Care: Level of care: Med-Surg Family Communication: Updated patient Disposition Plan:      Remains inpatient appropriate: Awaiting urine culture and sensitivities, possible DC to SNF in a.m. if available   Procedures:    Consultants:     Antimicrobials:   Anti-infectives (From admission, onward)    Start     Dose/Rate Route Frequency Ordered Stop   03/11/24 0800  cefTRIAXone (ROCEPHIN) 1 g in sodium chloride  0.9 % 100 mL IVPB        1 g 200 mL/hr over 30 Minutes Intravenous Every 24 hours 03/10/24 1245     03/10/24 0930  cefTRIAXone (ROCEPHIN) 1 g in sodium chloride  0.9 % 100 mL IVPB        1 g 200 mL/hr over 30 Minutes Intravenous  Once 03/10/24 0924 03/10/24 1003  Medications  acetaminophen   1,000 mg Oral Q8H   heparin  5,000 Units Subcutaneous Q8H   latanoprost   1 drop Both Eyes QHS      Subjective:   Brittany Mahoney was seen and examined today.  No acute complaints.  Pain is controlled while lying down.  No chest pain, shortness of breath, fever or chills abdominal pain.    Objective:   Vitals:   03/10/24 1626 03/10/24 1955 03/10/24 2353 03/11/24 0502  BP: 116/81 114/67 102/62 (!) 117/53  Pulse: 82 84 76 77  Resp: 20 18 18 16   Temp: 98 F (36.7 C) 97.8 F (36.6 C) 98.6 F (37 C) 98.8 F (37.1 C)  TempSrc: Oral Oral  Oral  SpO2: 94% 95% 94% 92%  Weight:      Height:        Intake/Output Summary (Last 24 hours) at 03/11/2024 1214 Last data filed at 03/11/2024 1018 Gross per 24 hour  Intake --  Output 950 ml  Net -950 ml     Wt Readings from Last 3 Encounters:  03/10/24 63.3 kg  12/03/23 59 kg  10/23/23 59 kg     Exam General: Alert and oriented x 3, NAD Cardiovascular: S1 S2 auscultated,   RRR Respiratory: Clear to auscultation bilaterally, no wheezing Gastrointestinal: Soft, nontender, nondistended, + bowel sounds Ext: no pedal edema bilaterally Neuro: Strength 5/5 upper and lower extremities bilaterally Psych: Normal affect     Data Reviewed:  I have personally reviewed following labs    CBC Lab Results  Component Value Date   WBC 7.4 03/11/2024   RBC 4.52 03/11/2024   HGB 14.3 03/11/2024   HCT 42.1 03/11/2024   MCV 93.1 03/11/2024   MCH 31.6 03/11/2024   PLT 255 03/11/2024   MCHC 34.0 03/11/2024   RDW 12.5 03/11/2024   LYMPHSABS 1.9 08/15/2016   MONOABS 0.4 08/15/2016   EOSABS 0.1 08/15/2016   BASOSABS 0.1 08/15/2016     Last metabolic panel Lab Results  Component Value Date   NA 139 03/11/2024   K 3.7 03/11/2024   CL 109 03/11/2024   CO2 25 03/11/2024   BUN 12 03/11/2024   CREATININE 0.84 03/11/2024   GLUCOSE 97 03/11/2024   GFRNONAA >60 03/11/2024   GFRAA 62 09/14/2020   CALCIUM 8.9 03/11/2024   PROT 7.4 09/14/2020   ALBUMIN 4.1 04/20/2019   BILITOT 1.1 09/14/2020   ALKPHOS 88 04/20/2019   AST 13 09/14/2020   ALT 14 09/14/2020   ANIONGAP 5 03/11/2024    CBG (last 3)  No results for input(s): GLUCAP in the last 72 hours.    Coagulation Profile: No results for input(s): INR, PROTIME in the last 168 hours.   Radiology Studies: I have personally reviewed the imaging studies  DG HIP UNILAT WITH PELVIS 2-3 VIEWS RIGHT Result Date: 03/10/2024 CLINICAL DATA:  Status post fall. Low back pain radiating into the left leg. EXAM: DG HIP (WITH OR WITHOUT PELVIS) 2-3V RIGHT; DG HIP (WITH OR WITHOUT PELVIS) 2-3V LEFT COMPARISON:  Pelvic and left hip radiographs 08/01/2018. FINDINGS: The bones appear mildly demineralized. There are stable postsurgical changes from previous right femoral intramedullary nail and dynamic screw fixation for an intertrochanteric fracture. The visualized hardware is intact without loosening. The distal end of the  intramedullary nail is not imaged. No evidence of acute fracture or dislocation. Stable mild degenerative changes of both hips and sacroiliac joints. IMPRESSION: No evidence of acute fracture or dislocation. Stable postsurgical changes in the right femur.  Electronically Signed   By: Elsie Perone M.D.   On: 03/10/2024 10:18   DG HIP UNILAT WITH PELVIS 2-3 VIEWS LEFT Result Date: 03/10/2024 CLINICAL DATA:  Status post fall. Low back pain radiating into the left leg. EXAM: DG HIP (WITH OR WITHOUT PELVIS) 2-3V RIGHT; DG HIP (WITH OR WITHOUT PELVIS) 2-3V LEFT COMPARISON:  Pelvic and left hip radiographs 08/01/2018. FINDINGS: The bones appear mildly demineralized. There are stable postsurgical changes from previous right femoral intramedullary nail and dynamic screw fixation for an intertrochanteric fracture. The visualized hardware is intact without loosening. The distal end of the intramedullary nail is not imaged. No evidence of acute fracture or dislocation. Stable mild degenerative changes of both hips and sacroiliac joints. IMPRESSION: No evidence of acute fracture or dislocation. Stable postsurgical changes in the right femur. Electronically Signed   By: Elsie Perone M.D.   On: 03/10/2024 10:18   CT Cervical Spine Wo Contrast Result Date: 03/10/2024 CLINICAL DATA:  89 year old female with pain radiating down the left leg after a fall this morning. EXAM: CT CERVICAL SPINE WITHOUT CONTRAST TECHNIQUE: Multidetector CT imaging of the cervical spine was performed without intravenous contrast. Multiplanar CT image reconstructions were also generated. RADIATION DOSE REDUCTION: This exam was performed according to the departmental dose-optimization program which includes automated exposure control, adjustment of the mA and/or kV according to patient size and/or use of iterative reconstruction technique. COMPARISON:  Head CT today. FINDINGS: Alignment: Degenerative appearing anterolisthesis of C4 on C5 and C7 on  T1. Underlying levoconvex cervical scoliosis, mild straightening and reversal of the normal cervical lordosis. Maintained bilateral posterior element alignment. Skull base and vertebrae: Bone mineralization is within normal limits for age. Visualized skull base is intact. No atlanto-occipital dissociation. C1 and C2 appear intact and aligned. No acute osseous abnormality identified. Soft tissues and spinal canal: No prevertebral fluid or swelling. No visible canal hematoma. Negative visible noncontrast neck soft tissues. Disc levels: Advanced cervical spine degeneration superimposed on degenerative ankylosis of the right C2-C3 and C4-C5 facets. Bulky cervical disc and endplate degeneration is widespread. Cervical spinal stenosis at C5-C6 and C6-C7 is moderate to severe by CT. Upper chest: Visible upper thoracic levels appear intact. Asymmetric sclerotic deformity of the head of the visible left clavicle, could be degenerative or posttraumatic. Negative lung apices. IMPRESSION: 1. No acute traumatic injury identified in the cervical spine. 2. Advanced cervical spine degeneration superimposed on multilevel spondylolisthesis and degenerative facet ankylosis at C2-C3 and C4-C5. Moderate to severe multifactorial spinal stenosis suspected at C5-C6 and C6-C7. Electronically Signed   By: VEAR Hurst M.D.   On: 03/10/2024 08:57   CT Lumbar Spine Wo Contrast Result Date: 03/10/2024 CLINICAL DATA:  89 year old female with pain radiating down the left leg after a fall this morning. EXAM: CT LUMBAR SPINE WITHOUT CONTRAST TECHNIQUE: Multidetector CT imaging of the lumbar spine was performed without intravenous contrast administration. Multiplanar CT image reconstructions were also generated. RADIATION DOSE REDUCTION: This exam was performed according to the departmental dose-optimization program which includes automated exposure control, adjustment of the mA and/or kV according to patient size and/or use of iterative reconstruction  technique. COMPARISON:  CT Abdomen and Pelvis 04/22/2006. Lumbar radiographs 08/01/2018. FINDINGS: Segmentation: Normal. Alignment: Levoconvex upper and dextroconvex lower lumbar scoliosis, moderate. Mildly exaggerated lower lumbar lordosis. Grade 1 anterolisthesis of L4 on L5 measuring about 5 mm. Vertebrae: L5 superior endplate compression is new since 2019, but also appears partially sclerotic. L5 loss of height is 35-40%. Mild retropulsion of the posterosuperior  endplate. L5 posterior elements appear intact and aligned. Vertebral osteopenia elsewhere. No other convincing lumbar endplate compression fracture. Chronic posterior left 12th rib fracture. Intact visible sacrum and SI joints. No other acute osseous abnormality. Paraspinal and other soft tissues: Chronic cholelithiasis. Calcified aortic atherosclerosis. Normal caliber abdominal aorta. Negative other visible noncontrast abdominal viscera. Lumbar paraspinal soft tissues are within normal limits. Disc levels: Chronic lumbar spine degeneration including levels of advanced disc and facet disease with vacuum disc and vacuum facet phenomena. Multifactorial lumbar spinal stenosis by CT is severe at L4-L5 (series 10, image 77) with severe bilateral lateral recess and left foraminal stenosis. IMPRESSION: 1. L5 superior endplate compression fracture is new since 2019 but age indeterminate. If specific therapy such as vertebroplasty is desired, Lumbar MRI or Nuclear Medicine Whole-body Bone Scan would best determine acuity. 2. Mild retropulsion of the L5 posterosuperior endplate is superimposed on degenerative grade 1 spondylolisthesis at L4-L5, advanced disc and facet degeneration there. Subsequent severe spinal, lateral recess, left foraminal stenosis. Query left L4 and/or L5 radiculitis. 3. No other acute osseous abnormality identified. Aortic Atherosclerosis (ICD10-I70.0). Electronically Signed   By: VEAR Hurst M.D.   On: 03/10/2024 08:54   CT Head Wo  Contrast Result Date: 03/10/2024 CLINICAL DATA:  89 year old female with pain radiating down the left leg after a fall this morning. EXAM: CT HEAD WITHOUT CONTRAST TECHNIQUE: Contiguous axial images were obtained from the base of the skull through the vertex without intravenous contrast. RADIATION DOSE REDUCTION: This exam was performed according to the departmental dose-optimization program which includes automated exposure control, adjustment of the mA and/or kV according to patient size and/or use of iterative reconstruction technique. COMPARISON:  Head CT 08/15/2016. FINDINGS: Brain: Cerebral volume not significantly changed since 2017. No midline shift, ventriculomegaly, mass effect, evidence of mass lesion, intracranial hemorrhage or evidence of cortically based acute infarction. Chronic but progressed, now confluent bilateral cerebral white matter hypodensity. Vascular: Calcified atherosclerosis at the skull base. No suspicious intracranial vascular hyperdensity. Skull: Intact. No acute osseous abnormality identified. Chronic TMJ degeneration. Sinuses/Orbits: Stable paranasal sinuses and mastoids, well aerated. Other: Right vertex mild scalp hematoma or contusion series 505, image 78. No scalp soft tissue gas. Underlying calvarium appears intact. No other acute orbit or scalp soft tissue injury identified. IMPRESSION: 1. Right vertex scalp hematoma or contusion. No skull fracture. 2. No acute intracranial abnormality. Progressed chronic white matter disease since 2017. Electronically Signed   By: VEAR Hurst M.D.   On: 03/10/2024 08:42       Lynetta Tomczak M.D. [facility] 03/11/2024, 12:14 PM  Available via Epic secure chat 7am-7pm After 7 pm, please refer to night coverage provider listed on amion.

## 2024-03-11 NOTE — Care Management Obs Status (Signed)
 MEDICARE OBSERVATION STATUS NOTIFICATION   Patient Details  Name: Brittany Mahoney MRN: [id number] Date of Birth: Jan 23, 1935   Medicare Observation Status Notification Given:  Yes    Duwaine LITTIE Ada 03/11/2024, 10:16 AM

## 2024-03-11 NOTE — TOC Initial Note (Signed)
 Transition of Care [facility]) - Initial/Assessment Note    Patient Details  Name: Brittany Mahoney MRN: [id number] Date of Birth: 11-10-1934  Transition of Care [facility]) CM/SW Contact:    Hoy DELENA Bigness, LCSW Phone Number: 03/11/2024, 10:10 AM  Clinical Narrative:                 Pt admitted due to mechanical fall at home. Pt currently from home alone as her daughter who typically lives with her is at [facility] for rehab. Pt has RW at home she uses at baseline for ambulation. Pt reluctantly agrees to have referrals sent out for SNF placement. Pt would like placement at [facility] nursing center as this is where he daughter currently is.  Referrals have been faxed out and currently awaiting bed offers.   Expected Discharge Plan: Skilled Nursing Facility Barriers to Discharge: Continued Medical Work up   Patient Goals and CMS Choice Patient states their goals for this hospitalization and ongoing recovery are:: To go home          Expected Discharge Plan and Services In-house Referral: Clinical Social Work Discharge Planning Services: NA Post Acute Care Choice: Skilled Nursing Facility Living arrangements for the past 2 months: Single Family Home                 DME Arranged: N/A DME Agency: NA                  Prior Living Arrangements/Services Living arrangements for the past 2 months: Single Family Home Lives with:: Self Patient language and need for interpreter reviewed:: Yes Do you feel safe going back to the place where you live?: Yes      Need for Family Participation in Patient Care: No (Comment) Care giver support system in place?: No (comment) Current home services: DME (RW) Criminal Activity/Legal Involvement Pertinent to Current Situation/Hospitalization: No - Comment as needed  Activities of Daily Living   ADL Screening (condition at time of admission) Independently performs ADLs?: Yes (appropriate for developmental age) Is the patient deaf or have difficulty hearing?:  No Does the patient have difficulty seeing, even when wearing glasses/contacts?: No Does the patient have difficulty concentrating, remembering, or making decisions?: No  Permission Sought/Granted Permission sought to share information with : Facility Medical sales representative, Family Supports Permission granted to share information with : Yes, Verbal Permission Granted  Share Information with NAME: Dominick Jama Caldron (Other)  [phone]  Permission granted to share info w AGENCY: SNF's        Emotional Assessment Appearance:: Appears stated age Attitude/Demeanor/Rapport: Engaged Affect (typically observed): Pleasant Orientation: : Oriented to Self, Oriented to Place, Oriented to  Time, Oriented to Situation Alcohol / Substance Use: Not Applicable Psych Involvement: No (comment)  Admission diagnosis:  Hypokalemia [E87.6] Acute UTI [N39.0] Lumbar compression fracture (HCC) [S32.000A] Generalized weakness [R53.1] Accidental fall, initial encounter [T80.XXXA] Closed compression fracture of L5 vertebra, initial encounter [facility]) [S32.050A] Patient Active Problem List   Diagnosis Date Noted   Lumbar compression fracture (HCC) 03/10/2024   Fall at home, initial encounter 03/10/2024   Hypokalemia 03/10/2024   Acute UTI 03/10/2024   Current mild episode of major depressive disorder (HCC) 11/05/2022   Elevated serum creatinine 11/05/2022   Physical deconditioning 11/03/2021   OA (osteoarthritis) of knee 06/23/2021   Osteoporosis    Other abnormalities of gait and mobility    Dementia (HCC)    Personal history of (healed) traumatic fracture    Repeated falls    Tremor, unspecified  History of uterine cancer 04/01/2017   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   PCP:  [name], MD Pharmacy:   [facility] [address] [place] [address] [place] [address] [place] [address] [place] [address] Phone: [phone] Fax: [phone]     Social Drivers of Health (SDOH) Social  History: SDOH Screenings   Food Insecurity: No Food Insecurity (03/10/2024)  Housing: Low Risk  (03/10/2024)  Transportation Needs: No Transportation Needs (03/10/2024)  Utilities: Not At Risk (03/10/2024)  Alcohol Screen: Low Risk  (10/23/2023)  Depression (PHQ2-9): Low Risk  (10/23/2023)  Financial Resource Strain: Low Risk  (10/23/2023)  Physical Activity: Inactive (10/23/2023)  Social Connections: Socially Isolated (03/10/2024)  Stress: No Stress Concern Present (10/23/2023)  Tobacco Use: Low Risk  (03/10/2024)  Health Literacy: Adequate Health Literacy (10/23/2023)   SDOH Interventions: Social Connections Interventions: Inpatient TOC, Intervention Not Indicated   Readmission Risk Interventions     No data to display

## 2024-03-12 DIAGNOSIS — R5381 Other malaise: Secondary | ICD-10-CM | POA: Diagnosis not present

## 2024-03-12 DIAGNOSIS — S32050G Wedge compression fracture of fifth lumbar vertebra, subsequent encounter for fracture with delayed healing: Secondary | ICD-10-CM | POA: Diagnosis not present

## 2024-03-12 DIAGNOSIS — H409 Unspecified glaucoma: Secondary | ICD-10-CM | POA: Diagnosis not present

## 2024-03-12 DIAGNOSIS — W19XXXA Unspecified fall, initial encounter: Secondary | ICD-10-CM | POA: Diagnosis not present

## 2024-03-12 DIAGNOSIS — N390 Urinary tract infection, site not specified: Secondary | ICD-10-CM | POA: Diagnosis not present

## 2024-03-12 DIAGNOSIS — Z8542 Personal history of malignant neoplasm of other parts of uterus: Secondary | ICD-10-CM | POA: Diagnosis not present

## 2024-03-12 DIAGNOSIS — S32050A Wedge compression fracture of fifth lumbar vertebra, initial encounter for closed fracture: Secondary | ICD-10-CM | POA: Diagnosis not present

## 2024-03-12 DIAGNOSIS — F02B18 Dementia in other diseases classified elsewhere, moderate, with other behavioral disturbance: Secondary | ICD-10-CM | POA: Diagnosis not present

## 2024-03-12 DIAGNOSIS — G309 Alzheimer's disease, unspecified: Secondary | ICD-10-CM | POA: Diagnosis not present

## 2024-03-12 DIAGNOSIS — E876 Hypokalemia: Secondary | ICD-10-CM | POA: Diagnosis not present

## 2024-03-12 DIAGNOSIS — E538 Deficiency of other specified B group vitamins: Secondary | ICD-10-CM | POA: Diagnosis not present

## 2024-03-12 DIAGNOSIS — S32000D Wedge compression fracture of unspecified lumbar vertebra, subsequent encounter for fracture with routine healing: Secondary | ICD-10-CM | POA: Diagnosis not present

## 2024-03-12 DIAGNOSIS — R531 Weakness: Secondary | ICD-10-CM | POA: Diagnosis not present

## 2024-03-12 DIAGNOSIS — M6281 Muscle weakness (generalized): Secondary | ICD-10-CM | POA: Diagnosis not present

## 2024-03-12 DIAGNOSIS — R488 Other symbolic dysfunctions: Secondary | ICD-10-CM | POA: Diagnosis not present

## 2024-03-12 DIAGNOSIS — M810 Age-related osteoporosis without current pathological fracture: Secondary | ICD-10-CM | POA: Diagnosis not present

## 2024-03-12 DIAGNOSIS — Z9181 History of falling: Secondary | ICD-10-CM | POA: Diagnosis not present

## 2024-03-12 DIAGNOSIS — M159 Polyosteoarthritis, unspecified: Secondary | ICD-10-CM | POA: Diagnosis not present

## 2024-03-12 DIAGNOSIS — R262 Difficulty in walking, not elsewhere classified: Secondary | ICD-10-CM | POA: Diagnosis not present

## 2024-03-12 DIAGNOSIS — N904 Leukoplakia of vulva: Secondary | ICD-10-CM | POA: Diagnosis not present

## 2024-03-12 DIAGNOSIS — F0390 Unspecified dementia without behavioral disturbance: Secondary | ICD-10-CM | POA: Diagnosis not present

## 2024-03-12 LAB — URINE CULTURE: Culture: 60000 — AB

## 2024-03-12 MED ORDER — ONDANSETRON HCL 4 MG PO TABS: 4.0000 mg | ORAL_TABLET | Freq: Four times a day (QID) | ORAL | Status: DC | PRN

## 2024-03-12 MED ORDER — OXYCODONE HCL 5 MG PO TABS: 5.0000 mg | ORAL_TABLET | Freq: Four times a day (QID) | ORAL | 0 refills | Status: DC | PRN

## 2024-03-12 MED ORDER — ACETAMINOPHEN 500 MG PO TABS: 1000.0000 mg | ORAL_TABLET | Freq: Three times a day (TID) | ORAL | Status: DC | PRN

## 2024-03-12 MED ORDER — CEPHALEXIN 500 MG PO CAPS
500.0000 mg | ORAL_CAPSULE | Freq: Two times a day (BID) | ORAL | Status: AC
Start: 2024-03-13 — End: 2024-03-17

## 2024-03-12 NOTE — Progress Notes (Signed)
 Mobility Specialist Progress Note:    03/12/24 1218  Mobility  Activity Refused mobility   Pt refused mobility, stated I will stay right here. All needs met.   Sherrilee Ditty Mobility Specialist Please contact via Special educational needs teacher or  Rehab office at [phone]

## 2024-03-12 NOTE — Progress Notes (Signed)
 Pt exp an uneventful night. However, this morning she presented with mild confusion, requesting to get out of bed to feed her five cats. Gently reminded pt she was in the hospital. Will continue to monitor for any other changes.

## 2024-03-12 NOTE — Care Management Important Message (Signed)
 Important Message  Patient Details  Name: Brittany Mahoney MRN: [id number] Date of Birth: [birth year]   Important Message Given:  Yes - Medicare IM     Loye Reininger L Miriam Liles 03/12/2024, 12:17 PM

## 2024-03-12 NOTE — Plan of Care (Signed)
  Problem: Clinical Measurements: Goal: Ability to maintain clinical measurements within normal limits will improve Outcome: Progressing Goal: Will remain free from infection Outcome: Progressing   Problem: Activity: Goal: Risk for activity intolerance will decrease Outcome: Progressing   Problem: Coping: Goal: Level of anxiety will decrease Outcome: Progressing   Problem: Elimination: Goal: Will not experience complications related to urinary retention Outcome: Progressing   Problem: Pain Managment: Goal: General experience of comfort will improve and/or be controlled Outcome: Progressing   Problem: Safety: Goal: Ability to remain free from injury will improve Outcome: Progressing

## 2024-03-12 NOTE — TOC Transition Note (Signed)
 Transition of Care [facility]) - Discharge Note   Patient Details  Name: Brittany Mahoney MRN: [id number] Date of Birth: 11-Jan-1935  Transition of Care [facility]) CM/SW Contact:  Hoy DELENA Bigness, LCSW Phone Number: 03/12/2024, 11:10 AM   Clinical Narrative:    Pt to transfer to [facility] for ST SNF. Pt will be going to room 159. RN to call report to [phone]. Spoke with pt's POA- Jama Jenkins Sayer and confirmed discharge plans. No further TOC needs identified. TOC signing off.    Final next level of care: Skilled Nursing Facility Barriers to Discharge: Barriers Resolved   Patient Goals and CMS Choice Patient states their goals for this hospitalization and ongoing recovery are:: To go home CMS Medicare.gov Compare Post Acute Care list provided to:: Patient Choice offered to / list presented to : Patient [place] ownership interest in [facility].provided to:: Patient    Discharge Placement   Existing PASRR number confirmed : 03/11/24          Patient chooses bed at: [facility] Patient to be transferred to facility by: Facility staff Name of family member notified: Jama Jenkins Sayer Patient and family notified of of transfer: 03/12/24  Discharge Plan and Services Additional resources added to the After Visit Summary for   In-house Referral: Clinical Social Work Discharge Planning Services: NA Post Acute Care Choice: Skilled Nursing Facility          DME Arranged: N/A DME Agency: NA       HH Arranged: NA HH Agency: NA        Social Drivers of Health (SDOH) Interventions SDOH Screenings   Food Insecurity: No Food Insecurity (03/10/2024)  Housing: Low Risk  (03/10/2024)  Transportation Needs: No Transportation Needs (03/10/2024)  Utilities: Not At Risk (03/10/2024)  Alcohol Screen: Low Risk  (10/23/2023)  Depression (PHQ2-9): Low Risk  (10/23/2023)  Financial Resource Strain: Low Risk  (10/23/2023)  Physical Activity: Inactive (10/23/2023)  Social Connections: Socially  Isolated (03/10/2024)  Stress: No Stress Concern Present (10/23/2023)  Tobacco Use: Low Risk  (03/10/2024)  Health Literacy: Adequate Health Literacy (10/23/2023)     Readmission Risk Interventions    03/12/2024   11:07 AM  Readmission Risk Prevention Plan  Post Dischage Appt Complete  Medication Screening Complete  Transportation Screening Complete

## 2024-03-12 NOTE — Progress Notes (Signed)
 Spoke with Delon, RN at [facility] center and provided patient report.

## 2024-03-12 NOTE — Discharge Summary (Signed)
 Physician Discharge Summary   Patient: Brittany Mahoney MRN: [id number] DOB: May 08, 1935  Admit date:     03/10/2024  Discharge date: 03/12/24  Discharge Physician: Nydia Distance, MD    PCP: [name], MD   Recommendations at discharge:   Continue Keflex 500 mg twice daily for 4 more days Physical therapy, Occupational Therapy to follow  Discharge Diagnoses:    Lumbar compression fracture/L5 compression fracture (HCC)    Fall at home, initial encounter   Hypokalemia   Acute UTI   Hospital Course:  Patient is a 89 year old female with OA, endometrial CA status post TAH cognitive impairment (MoCA 21/30) presents with a mechanical fall. The patient was in her recliner. She stood up with her walker and the walker slid out from under her. She fell and believes she hit her head and right side. Fortunately, the patient had her cell phone. She contacted her neighbor who came to help her. EMS was activated.  Patient has been living with her daughter who was helping her, unfortunately daughter has been hospitalized with medical illness and patient has been alone for over a week.  The patient reported low back pain, positional since her fall. In the ED, CT lumbar spine showed age-indeterminate L5 compression fracture with mild retropulsion UA positive for UTI  Assessment and Plan:  Mechanical fall with gait instability, lumbar compression fracture - At baseline, the patient ambulates with a walker - Now she has having difficulty getting up and transferring - PT evaluation recommended SNF - X-ray of the pelvis negative for fracture - CT for cervical spine and brain negative for traumatic injury but did show right scalp hematoma -Pain is controlled - Plan to discharge to SNF today   Acute urinary tract infection - UA positive for UTI, urine culture showed 60,000 colonies of E. coli,  - Patient received IV Rocephin inpatient, transition to oral Keflex for 4 more days to complete full  course X 7 days   Hypokalemia - Replaced.  Potassium is at 3.7 at discharge   History of endometrial CA status post TAH - Outpatient follow-up with oncology     Estimated body mass index is 22.19 kg/m as calculated from the following:   Height as of this encounter: 5' 6.5 (1.689 m).   Weight as of this encounter: 63.3 kg.        Pain control - [place]  Controlled Substance Reporting System database was reviewed. and patient was instructed, not to drive, operate heavy machinery, perform activities at heights, swimming or participation in water activities or provide baby-sitting services while on Pain, Sleep and Anxiety Medications; until their outpatient Physician has advised to do so again. Also recommended to not to take more than prescribed Pain, Sleep and Anxiety Medications.  Consultants: none  Procedures performed: None Disposition: Skilled nursing facility Diet recommendation:   DISCHARGE MEDICATION: Allergies as of 03/12/2024       Reactions   Penicillins Hives, Swelling   Has patient had a PCN reaction causing immediate rash, facial/tongue/throat swelling, SOB or lightheadedness with hypotension: Yes Has patient had a PCN reaction causing severe rash involving mucus membranes or skin necrosis: No Has patient had a PCN reaction that required hospitalization No Has patient had a PCN reaction occurring within the last 10 years: No  If all of the above answers are NO, then may proceed with Cephalosporin use.   Codeine    Erythromycin    Morphine  And Codeine Other (See Comments)   loopy  Medication List     TAKE these medications    acetaminophen  500 MG tablet Commonly known as: TYLENOL  Take 2 tablets (1,000 mg total) by mouth every 8 (eight) hours as needed for mild pain (pain score 1-3).   cephALEXin 500 MG capsule Commonly known as: KEFLEX Take 1 capsule (500 mg total) by mouth 2 (two) times daily for 4 days. Start taking on: March 13, 2024    Lumigan 0.01 % Soln Generic drug: bimatoprost INSTILL 1 DROP IN INTO BOTH EYES AT BEDTIME   ondansetron  4 MG tablet Commonly known as: ZOFRAN  Take 1 tablet (4 mg total) by mouth every 6 (six) hours as needed for nausea.   oxyCODONE 5 MG immediate release tablet Commonly known as: Oxy IR/ROXICODONE Take 1 tablet (5 mg total) by mouth every 6 (six) hours as needed for moderate pain (pain score 4-6) or severe pain (pain score 7-10).        Contact information for follow-up providers     [name], MD. Schedule an appointment as soon as possible for a visit in 2 week(s).   Specialty: Internal Medicine Why: for hospital follow-up Contact information: [address] [place] [address] [phone]              Contact information for after-discharge care     Destination     [facility] .   Service: Skilled Nursing Contact information: [address] [place]  [address] [phone]                    Discharge Exam: Filed Weights   03/10/24 0702 03/10/24 1247  Weight: 59 kg 63.3 kg   S:  no acute complaints, this morning was having some headache.  No nausea vomiting, chest pain, shortness of breath, fevers.  BP (!) 144/80 (BP Location: Left Arm)   Pulse 82   Temp 97.9 F (36.6 C) (Oral)   Resp 18   Ht 5' 6.5 (1.689 m)   Wt 63.3 kg   SpO2 93%   BMI 22.19 kg/m   Physical Exam General: Alert and oriented x 3, NAD Cardiovascular: S1 S2 clear, RRR.  Respiratory: CTAB, no wheezing Gastrointestinal: Soft, nontender, nondistended, NBS Ext: no pedal edema bilaterally Neuro: no new deficits Psych: Normal affect    Condition at discharge: fair  The results of significant diagnostics from this hospitalization (including imaging, microbiology, ancillary and laboratory) are listed below for reference.   Imaging Studies: DG HIP UNILAT WITH PELVIS 2-3 VIEWS RIGHT Result Date: 03/10/2024 CLINICAL DATA:  Status post  fall. Low back pain radiating into the left leg. EXAM: DG HIP (WITH OR WITHOUT PELVIS) 2-3V RIGHT; DG HIP (WITH OR WITHOUT PELVIS) 2-3V LEFT COMPARISON:  Pelvic and left hip radiographs 08/01/2018. FINDINGS: The bones appear mildly demineralized. There are stable postsurgical changes from previous right femoral intramedullary nail and dynamic screw fixation for an intertrochanteric fracture. The visualized hardware is intact without loosening. The distal end of the intramedullary nail is not imaged. No evidence of acute fracture or dislocation. Stable mild degenerative changes of both hips and sacroiliac joints. IMPRESSION: No evidence of acute fracture or dislocation. Stable postsurgical changes in the right femur. Electronically Signed   By: Elsie Perone M.D.   On: 03/10/2024 10:18   DG HIP UNILAT WITH PELVIS 2-3 VIEWS LEFT Result Date: 03/10/2024 CLINICAL DATA:  Status post fall. Low back pain radiating into the left leg. EXAM: DG HIP (WITH OR WITHOUT PELVIS) 2-3V  RIGHT; DG HIP (WITH OR WITHOUT PELVIS) 2-3V LEFT COMPARISON:  Pelvic and left hip radiographs 08/01/2018. FINDINGS: The bones appear mildly demineralized. There are stable postsurgical changes from previous right femoral intramedullary nail and dynamic screw fixation for an intertrochanteric fracture. The visualized hardware is intact without loosening. The distal end of the intramedullary nail is not imaged. No evidence of acute fracture or dislocation. Stable mild degenerative changes of both hips and sacroiliac joints. IMPRESSION: No evidence of acute fracture or dislocation. Stable postsurgical changes in the right femur. Electronically Signed   By: Elsie Perone M.D.   On: 03/10/2024 10:18   CT Cervical Spine Wo Contrast Result Date: 03/10/2024 CLINICAL DATA:  89 year old female with pain radiating down the left leg after a fall this morning. EXAM: CT CERVICAL SPINE WITHOUT CONTRAST TECHNIQUE: Multidetector CT imaging of the cervical spine  was performed without intravenous contrast. Multiplanar CT image reconstructions were also generated. RADIATION DOSE REDUCTION: This exam was performed according to the departmental dose-optimization program which includes automated exposure control, adjustment of the mA and/or kV according to patient size and/or use of iterative reconstruction technique. COMPARISON:  Head CT today. FINDINGS: Alignment: Degenerative appearing anterolisthesis of C4 on C5 and C7 on T1. Underlying levoconvex cervical scoliosis, mild straightening and reversal of the normal cervical lordosis. Maintained bilateral posterior element alignment. Skull base and vertebrae: Bone mineralization is within normal limits for age. Visualized skull base is intact. No atlanto-occipital dissociation. C1 and C2 appear intact and aligned. No acute osseous abnormality identified. Soft tissues and spinal canal: No prevertebral fluid or swelling. No visible canal hematoma. Negative visible noncontrast neck soft tissues. Disc levels: Advanced cervical spine degeneration superimposed on degenerative ankylosis of the right C2-C3 and C4-C5 facets. Bulky cervical disc and endplate degeneration is widespread. Cervical spinal stenosis at C5-C6 and C6-C7 is moderate to severe by CT. Upper chest: Visible upper thoracic levels appear intact. Asymmetric sclerotic deformity of the head of the visible left clavicle, could be degenerative or posttraumatic. Negative lung apices. IMPRESSION: 1. No acute traumatic injury identified in the cervical spine. 2. Advanced cervical spine degeneration superimposed on multilevel spondylolisthesis and degenerative facet ankylosis at C2-C3 and C4-C5. Moderate to severe multifactorial spinal stenosis suspected at C5-C6 and C6-C7. Electronically Signed   By: VEAR Hurst M.D.   On: 03/10/2024 08:57   CT Lumbar Spine Wo Contrast Result Date: 03/10/2024 CLINICAL DATA:  89 year old female with pain radiating down the left leg after a fall  this morning. EXAM: CT LUMBAR SPINE WITHOUT CONTRAST TECHNIQUE: Multidetector CT imaging of the lumbar spine was performed without intravenous contrast administration. Multiplanar CT image reconstructions were also generated. RADIATION DOSE REDUCTION: This exam was performed according to the departmental dose-optimization program which includes automated exposure control, adjustment of the mA and/or kV according to patient size and/or use of iterative reconstruction technique. COMPARISON:  CT Abdomen and Pelvis 04/22/2006. Lumbar radiographs 08/01/2018. FINDINGS: Segmentation: Normal. Alignment: Levoconvex upper and dextroconvex lower lumbar scoliosis, moderate. Mildly exaggerated lower lumbar lordosis. Grade 1 anterolisthesis of L4 on L5 measuring about 5 mm. Vertebrae: L5 superior endplate compression is new since 2019, but also appears partially sclerotic. L5 loss of height is 35-40%. Mild retropulsion of the posterosuperior endplate. L5 posterior elements appear intact and aligned. Vertebral osteopenia elsewhere. No other convincing lumbar endplate compression fracture. Chronic posterior left 12th rib fracture. Intact visible sacrum and SI joints. No other acute osseous abnormality. Paraspinal and other soft tissues: Chronic cholelithiasis. Calcified aortic atherosclerosis. Normal caliber abdominal aorta.  Negative other visible noncontrast abdominal viscera. Lumbar paraspinal soft tissues are within normal limits. Disc levels: Chronic lumbar spine degeneration including levels of advanced disc and facet disease with vacuum disc and vacuum facet phenomena. Multifactorial lumbar spinal stenosis by CT is severe at L4-L5 (series 10, image 77) with severe bilateral lateral recess and left foraminal stenosis. IMPRESSION: 1. L5 superior endplate compression fracture is new since 2019 but age indeterminate. If specific therapy such as vertebroplasty is desired, Lumbar MRI or Nuclear Medicine Whole-body Bone Scan would  best determine acuity. 2. Mild retropulsion of the L5 posterosuperior endplate is superimposed on degenerative grade 1 spondylolisthesis at L4-L5, advanced disc and facet degeneration there. Subsequent severe spinal, lateral recess, left foraminal stenosis. Query left L4 and/or L5 radiculitis. 3. No other acute osseous abnormality identified. Aortic Atherosclerosis (ICD10-I70.0). Electronically Signed   By: VEAR Hurst M.D.   On: 03/10/2024 08:54   CT Head Wo Contrast Result Date: 03/10/2024 CLINICAL DATA:  89 year old female with pain radiating down the left leg after a fall this morning. EXAM: CT HEAD WITHOUT CONTRAST TECHNIQUE: Contiguous axial images were obtained from the base of the skull through the vertex without intravenous contrast. RADIATION DOSE REDUCTION: This exam was performed according to the departmental dose-optimization program which includes automated exposure control, adjustment of the mA and/or kV according to patient size and/or use of iterative reconstruction technique. COMPARISON:  Head CT 08/15/2016. FINDINGS: Brain: Cerebral volume not significantly changed since 2017. No midline shift, ventriculomegaly, mass effect, evidence of mass lesion, intracranial hemorrhage or evidence of cortically based acute infarction. Chronic but progressed, now confluent bilateral cerebral white matter hypodensity. Vascular: Calcified atherosclerosis at the skull base. No suspicious intracranial vascular hyperdensity. Skull: Intact. No acute osseous abnormality identified. Chronic TMJ degeneration. Sinuses/Orbits: Stable paranasal sinuses and mastoids, well aerated. Other: Right vertex mild scalp hematoma or contusion series 505, image 78. No scalp soft tissue gas. Underlying calvarium appears intact. No other acute orbit or scalp soft tissue injury identified. IMPRESSION: 1. Right vertex scalp hematoma or contusion. No skull fracture. 2. No acute intracranial abnormality. Progressed chronic white matter  disease since 2017. Electronically Signed   By: VEAR Hurst M.D.   On: 03/10/2024 08:42    Microbiology: Results for orders placed or performed during the hospital encounter of 03/10/24  Urine Culture     Status: Abnormal   Collection Time: 03/10/24  7:44 AM   Specimen: Urine, Clean Catch  Result Value Ref Range Status   Specimen Description   Final    URINE, CLEAN CATCH Performed at [facility], [address] [place], [place] [address]    Special Requests   Final    NONE Performed at [facility], [address], [place], [place] [address]    Culture 60,000 COLONIES/mL ESCHERICHIA COLI (A)  Final   Report Status 03/12/2024 FINAL  Final   Organism ID, Bacteria ESCHERICHIA COLI (A)  Final      Susceptibility   Escherichia coli - MIC*    AMPICILLIN <=2 SENSITIVE Sensitive     CEFAZOLIN  <=4 SENSITIVE Sensitive     CEFEPIME <=0.12 SENSITIVE Sensitive     CEFTRIAXONE <=0.25 SENSITIVE Sensitive     CIPROFLOXACIN  <=0.25 SENSITIVE Sensitive     GENTAMICIN <=1 SENSITIVE Sensitive     IMIPENEM <=0.25 SENSITIVE Sensitive     NITROFURANTOIN <=16 SENSITIVE Sensitive     TRIMETH /SULFA  <=20 SENSITIVE Sensitive     AMPICILLIN/SULBACTAM <=2 SENSITIVE Sensitive     PIP/TAZO <=4 SENSITIVE Sensitive ug/mL    *  60,000 COLONIES/mL ESCHERICHIA COLI    Labs: CBC: Recent Labs  Lab 03/10/24 0727 03/11/24 0444  WBC 10.3 7.4  HGB 13.9 14.3  HCT 41.8 42.1  MCV 93.3 93.1  PLT 271 255   Basic Metabolic Panel: Recent Labs  Lab 03/10/24 0727 03/11/24 0444  NA 141 139  K 3.3* 3.7  CL 107 109  CO2 23 25  GLUCOSE 112* 97  BUN 14 12  CREATININE 0.79 0.84  CALCIUM 8.7* 8.9  MG  --  2.2   Liver Function Tests: No results for input(s): AST, ALT, ALKPHOS, BILITOT, PROT, ALBUMIN in the last 168 hours. CBG: No results for input(s): GLUCAP in the last 168 hours.  Discharge time spent: greater than 30 minutes.  Signed: Nydia Distance, MD [facility] 03/12/2024

## 2024-03-17 ENCOUNTER — Telehealth: Payer: Self-pay | Admitting: Internal Medicine

## 2024-03-17 NOTE — Telephone Encounter (Unsigned)
 Copied from CRM [id number]. Topic: General - Other >> Mar 17, 2024 10:21 AM Donee H wrote: Reason for CRM: Connor Piety, patient's POA, would like to confirm if Dr. Tobie leaving . She states she heard he was but need to confirm that before she starts looking for another doctor for the patient. Please follow up with her at [phone]

## 2024-03-17 NOTE — Telephone Encounter (Signed)
 Patient POA advised that Dr Tobie is NOT leaving our office.

## 2024-03-18 ENCOUNTER — Ambulatory Visit: Admitting: Internal Medicine

## 2024-03-19 ENCOUNTER — Encounter: Payer: Self-pay | Admitting: Internal Medicine

## 2024-03-19 ENCOUNTER — Non-Acute Institutional Stay (SKILLED_NURSING_FACILITY): Payer: Self-pay | Admitting: Internal Medicine

## 2024-03-19 DIAGNOSIS — N390 Urinary tract infection, site not specified: Secondary | ICD-10-CM | POA: Diagnosis not present

## 2024-03-19 DIAGNOSIS — E538 Deficiency of other specified B group vitamins: Secondary | ICD-10-CM | POA: Diagnosis not present

## 2024-03-19 DIAGNOSIS — S32050G Wedge compression fracture of fifth lumbar vertebra, subsequent encounter for fracture with delayed healing: Secondary | ICD-10-CM | POA: Diagnosis not present

## 2024-03-19 NOTE — Patient Instructions (Signed)
 See assessment and plan under each diagnosis in the problem list and acutely for this visit

## 2024-03-19 NOTE — Assessment & Plan Note (Signed)
 Urine culture revealed only 60,000 colonies E. coli but she did really receive IV Rocephin  as an inpatient with transition to oral Keflex  for 4 additional days.

## 2024-03-19 NOTE — Progress Notes (Signed)
 NURSING HOME LOCATION:  [facility] ROOM NUMBER:  [id number]  CODE STATUS: Full Code  PCP: Suzzane Blanch MD  This is a comprehensive admission note to this SNFperformed on this date less than 30 days from date of admission. Included are preadmission medical/surgical history; reconciled medication list; family history; social history and comprehensive review of systems.  Corrections and additions to the records were documented. Comprehensive physical exam was also performed. Additionally a clinical summary was entered for each active diagnosis pertinent to this admission in the Problem List to enhance continuity of care.  HPI: She was hospitalized 7/1 - 03/12/2024 presenting after a mechanical fall.  She had been in a recliner and stood to use her walker when it slid out from under her.  Using her cell phone she contacted her neighbor who activated EMS.  Her major complaint was low back pain; CT lumbar spine demonstrated age-indeterminate L5 compression fracture with mild retropulsion.  This was associated with difficulty arising and transferring.  PT evaluated her and recommended SNF placement for rehab. She also hit her head and right side in the fall.  CT of the cervical spine and brain were negative for traumatic injury but did show right scalp hematoma. Urine culture revealed 60,000 colonies of E. coli.  She had received IV Rocephin  as an inpatient which was transitioned to oral Keflex  for 4 additional days. Hypokalemia was corrected with a final value of 3.7.  CBC and indices were normal but B12 level was low at 172.  TSH was therapeutic.  Past medical and surgical history: Includes osteoporosis,  history of endometrial cancer, history of hepatitis, and history of neurocognitive impairment. Surgical procedures include abdominal hysterectomy, colonoscopy, and intertrochanteric intramedullary nailing.    Family history: reviewed, non contributory due to advanced age.  Social  history: Nondrinker, non-smoker   Review of systems: Clinical neurocognitive deficits made validity of responses questionable.  She did not remember meeting me despite the fact we discussed her daughter's complex case on multiple occasions.  She did realize that she had a bone compression that affects my hips.  She states it is slowly improving.  She denied any cardiac or neurologic prodrome prior to the fall.  She denies any radiculopathy symptoms or stool / bowel incontinence.  She denies any GU symptoms or any symptoms related to low B12.  Constitutional: No fever, significant weight change, fatigue  Eyes: No redness, discharge, pain, vision change ENT/mouth: No nasal congestion, purulent discharge, earache, change in hearing, sore throat  Cardiovascular: No chest pain, palpitations, paroxysmal nocturnal dyspnea, claudication, edema  Respiratory: No cough, sputum production, hemoptysis, DOE, significant snoring, apnea Gastrointestinal: No heartburn, dysphagia, abdominal pain, nausea /vomiting, rectal bleeding, melena, change in bowels Genitourinary: No dysuria, hematuria, pyuria, incontinence, nocturia Dermatologic: No rash, pruritus, change in appearance of skin Neurologic: No dizziness, headache, syncope, seizures, numbness, tingling Psychiatric: No significant anxiety, depression, insomnia, anorexia Endocrine: No change in hair/skin/nails, excessive thirst, excessive hunger, excessive urination  Hematologic/lymphatic: No significant bruising, lymphadenopathy, abnormal bleeding  Physical exam:  Pertinent or positive findings: Facies tend to be masked.  There is slight ptosis on the left.  There is a small polypoid lesion of the left lower lid.  Dental hygiene is surprisingly good.  There is slight abdominal prominence.  Pedal pulses are decreased to palpation.  She has marked interosseous wasting of the hands.  She has isolated osteoarthritic changes of the hands as well.  General  appearance: no acute distress, increased work of breathing  is present.   Lymphatic: No lymphadenopathy about the head, neck, axilla. Eyes: No conjunctival inflammation or lid edema is present. There is no scleral icterus. Ears:  External ear exam shows no significant lesions or deformities.   Nose:  External nasal examination shows no deformity or inflammation. Nasal mucosa are pink and moist without lesions, exudates Neck:  No thyromegaly, masses, tenderness noted.    Heart:  Normal rate and regular rhythm. S1 and S2 normal without gallop, murmur, click, rub.  Lungs: Chest clear to auscultation without wheezes, rhonchi, rales, rubs. Abdomen: Bowel sounds are normal.  Abdomen is soft and nontender with no organomegaly, hernias, masses. GU: Deferred  Extremities:  No cyanosis, clubbing, edema. Neurologic exam: Balance, Rhomberg, finger to nose testing could not be completed due to clinical state Deep tendon reflexes are equal Skin: Warm & dry w/o tenting. No significant lesions or rash.  See clinical summary under each active problem in the Problem List with associated updated therapeutic plan

## 2024-03-19 NOTE — Assessment & Plan Note (Addendum)
 CBC and indices normal but B12 level 172.  Supplementation with 1000 mcg of B12 daily with recheck of B12 level in 6-12 weeks to verify adequate oral absorption.

## 2024-03-19 NOTE — Assessment & Plan Note (Signed)
PT/OT at SNF as tolerated. ?

## 2024-03-20 ENCOUNTER — Other Ambulatory Visit: Payer: Self-pay | Admitting: *Deleted

## 2024-03-20 NOTE — Patient Outreach (Signed)
 Mrs. Konczal admitted to [facility] SNF under [facility] SNF waiver.   Writer to follow for transition plans.   [name], MSN, RN, BSN [place]  [facility] RN Post- Acute Care Manager Direct Dial: [phone]

## 2024-03-27 ENCOUNTER — Other Ambulatory Visit: Payer: Self-pay | Admitting: Adult Health

## 2024-03-27 ENCOUNTER — Non-Acute Institutional Stay (SKILLED_NURSING_FACILITY): Payer: Self-pay | Admitting: Adult Health

## 2024-03-27 ENCOUNTER — Encounter: Payer: Self-pay | Admitting: Adult Health

## 2024-03-27 DIAGNOSIS — G309 Alzheimer's disease, unspecified: Secondary | ICD-10-CM

## 2024-03-27 DIAGNOSIS — R5381 Other malaise: Secondary | ICD-10-CM

## 2024-03-27 DIAGNOSIS — S32050G Wedge compression fracture of fifth lumbar vertebra, subsequent encounter for fracture with delayed healing: Secondary | ICD-10-CM

## 2024-03-27 DIAGNOSIS — F02B18 Dementia in other diseases classified elsewhere, moderate, with other behavioral disturbance: Secondary | ICD-10-CM | POA: Diagnosis not present

## 2024-03-27 MED ORDER — LUMIGAN 0.01 % OP SOLN: 1.0000 [drp] | Freq: Every day | OPHTHALMIC | 0 refills | Status: AC

## 2024-03-27 MED ORDER — LATANOPROST 0.005 % OP SOLN: 1.0000 [drp] | Freq: Every day | OPHTHALMIC | 0 refills | Status: AC

## 2024-03-27 NOTE — Progress Notes (Signed)
 Location:  [facility] Room Number: 159/P Place of Service:  SNF (31)   CODE STATUS: full   Allergies  Allergen Reactions   Penicillins Hives and Swelling    Has patient had a PCN reaction causing immediate rash, facial/tongue/throat swelling, SOB or lightheadedness with hypotension: Yes Has patient had a PCN reaction causing severe rash involving mucus membranes or skin necrosis: No Has patient had a PCN reaction that required hospitalization No Has patient had a PCN reaction occurring within the last 10 years: No  If all of the above answers are NO, then may proceed with Cephalosporin use.    Codeine    Erythromycin    Morphine  And Codeine Other (See Comments)    loopy    Chief Complaint  Patient presents with   Discharge Note    HPI:  She is being discharged to home with home health for pt/ot. She will need her prescriptions written and will need to have her prescriptions written and will need to follow up with her medical provider. She had been hospitalized for an L5 compression fracture. She was admitted to this facility for short term rehab. Therapy: ambulate 250 feet with walker; steps with bilateral hand rails; upper and lower body independent with assistive device; brp independent with assist equipment.   Past Medical History:  Diagnosis Date   Age-related osteoporosis without current pathological fracture    Arthritis    Arthropathy, unspecified    Broken hip (HCC)    Cancer (HCC)    uterus   Chronic kidney disease, stage 1    Closed right hip fracture (HCC) 04/09/2016   Endometrial cancer, grade I (HCC) 03/21/2015   Fracture of unspecified part of neck of right femur, sequela    Glaucoma    Headache(784.0)    migraines after periods   Hepatitis    Hx of cancer of endometrium 03/06/2013   Osteoporosis    Other abnormalities of gait and mobility    Other amnesia    Pain in left knee    Pain in right foot    Pain in right shoulder     Personal history of (healed) traumatic fracture    PMB (postmenopausal bleeding)    Repeated falls    Tremor, unspecified     Past Surgical History:  Procedure Laterality Date   ABDOMINAL HYSTERECTOMY  2007   TAH and BSO   COLONOSCOPY  07/31/2006   Dr.Rourk   DILATION AND CURETTAGE OF UTERUS     HIP FRACTURE SURGERY     INTRAMEDULLARY (IM) NAIL INTERTROCHANTERIC Right 04/10/2016   Procedure: INTRAMEDULLARY (IM) NAIL RIGHT HIP FRACTURE;  Surgeon: Lonni CINDERELLA Poli, MD;  Location: [facility] OR;  Service: Orthopedics;  Laterality: Right;    Social History   Socioeconomic History   Marital status: Widowed    Spouse name: Not on file   Number of children: Not on file   Years of education: Not on file   Highest education level: Not on file  Occupational History   Not on file  Tobacco Use   Smoking status: Never   Smokeless tobacco: Never  Vaping Use   Vaping status: Never Used  Substance and Sexual Activity   Alcohol use: No   Drug use: No   Sexual activity: Not Currently    Birth control/protection: Surgical  Other Topics Concern   Not on file  Social History Narrative   Widow since 2016,married for 55 years.Lives with daughter.Airline pilot for [employer] ,  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   Social Drivers of Corporate investment banker Strain: Low Risk  (10/23/2023)   Overall Financial Resource Strain (CARDIA)    Difficulty of Paying Living Expenses: Not hard at all  Food Insecurity: No Food Insecurity (03/10/2024)   Hunger Vital Sign    Worried About Running Out of Food in the Last Year: Never true    Ran Out of Food in the Last Year: Never true  Transportation Needs: No Transportation Needs (03/10/2024)   PRAPARE - Administrator, Civil Service (Medical): No    Lack of Transportation (Non-Medical): No  Physical Activity: Inactive (10/23/2023)   Exercise Vital Sign    Days of Exercise per Week: 0 days    Minutes of Exercise per Session: 0 min  Stress:  No Stress Concern Present (10/23/2023)   [employer] of Occupational Health - Occupational Stress Questionnaire    Feeling of Stress : Not at all  Social Connections: Socially Isolated (03/10/2024)   Social Connection and Isolation Panel    Frequency of Communication with Friends and Family: More than three times a week    Frequency of Social Gatherings with Friends and Family: More than three times a week    Attends Religious Services: Never    Database administrator or Organizations: No    Attends Banker Meetings: Never    Marital Status: Widowed  Intimate Partner Violence: Not At Risk (03/10/2024)   Humiliation, Afraid, Rape, and Kick questionnaire    Fear of Current or Ex-Partner: No    Emotionally Abused: No    Physically Abused: No    Sexually Abused: No   Family History  Problem Relation Age of Onset   Stroke Maternal Grandfather    Stroke Paternal Grandfather    Cancer Mother    Cancer Father        lymphatic sarcoma   Early death Sister    Early death Son    Diabetes Son    Heart disease Other    Cancer Other    Tuberculosis Other    Diabetes Other    Thyroid  disease Other    Stroke Other    Heart disease Maternal Aunt    Heart disease Maternal Uncle       VITAL SIGNS BP 116/63 Comment: Taken on 03/22/24  Ht 5' 7 (1.702 m)   Wt 143 lb 12.8 oz (65.2 kg)   BMI 22.52 kg/m   Outpatient Encounter Medications as of 03/27/2024  Medication Sig   acetaminophen  (TYLENOL ) 500 MG tablet Take 2 tablets (1,000 mg total) by mouth every 8 (eight) hours as needed for mild pain (pain score 1-3).   Calcium Carb-Cholecalciferol (CALCIUM CARBONATE-VITAMIN D3 PO) Take 500 mg by mouth daily.   cyanocobalamin  (VITAMIN B12) 1000 MCG tablet Take 1,000 mcg by mouth daily.   [DISCONTINUED] latanoprost  (XALATAN ) 0.005 % ophthalmic solution Place 1 drop into both eyes daily.   [DISCONTINUED] LUMIGAN  0.01 % SOLN INSTILL 1 DROP IN INTO BOTH EYES AT BEDTIME    [DISCONTINUED] ondansetron  (ZOFRAN ) 4 MG tablet Take 1 tablet (4 mg total) by mouth every 6 (six) hours as needed for nausea. (Patient not taking: Reported on 03/27/2024)   [DISCONTINUED] oxyCODONE  (OXY IR/ROXICODONE ) 5 MG immediate release tablet Take 1 tablet (5 mg total) by mouth every 6 (six) hours as needed for moderate pain (pain score 4-6) or severe pain (pain score 7-10). (Patient not taking: Reported on 03/27/2024)   No facility-administered encounter medications  on file as of 03/27/2024.     SIGNIFICANT DIAGNOSTIC EXAMS  Review of Systems  Constitutional:  Negative for malaise/fatigue.  Respiratory:  Negative for cough and shortness of breath.   Cardiovascular:  Negative for chest pain, palpitations and leg swelling.  Gastrointestinal:  Negative for abdominal pain, constipation and heartburn.  Musculoskeletal:  Negative for back pain, joint pain and myalgias.  Skin: Negative.   Neurological:  Negative for dizziness.  Psychiatric/Behavioral:  The patient is not nervous/anxious.     Physical Exam Constitutional:      General: She is not in acute distress.    Appearance: She is well-developed. She is not diaphoretic.  Neck:     Thyroid : No thyromegaly.  Cardiovascular:     Rate and Rhythm: Normal rate and regular rhythm.     Pulses: Normal pulses.     Heart sounds: Normal heart sounds.  Pulmonary:     Effort: Pulmonary effort is normal. No respiratory distress.     Breath sounds: Normal breath sounds.  Abdominal:     General: Bowel sounds are normal. There is no distension.     Palpations: Abdomen is soft.     Tenderness: There is no abdominal tenderness.  Musculoskeletal:        General: Normal range of motion.     Cervical back: Neck supple.     Right lower leg: No edema.     Left lower leg: No edema.  Lymphadenopathy:     Cervical: No cervical adenopathy.  Skin:    General: Skin is warm and dry.  Neurological:     Mental Status: She is alert and oriented to  person, place, and time.      ASSESSMENT/ PLAN:   Patient is being discharged with the following home health services:  pt/ot to evaluate and treat as indicated for gait balance strength adl training   Patient is being discharged with the following durable medical equipment:  none needed   Patient has been advised to f/u with their PCP in 1-2 weeks to for a transitions of care visit.  Social services at their facility was responsible for arranging this appointment.  Pt was provided with adequate prescriptions of noncontrolled medications to reach the scheduled appointment .  For controlled substances, a limited supply was provided as appropriate for the individual patient.  If the pt normally receives these medications from a pain clinic or has a contract with another physician, these medications should be received from that clinic or physician only).    A 30 day supply of her prescription medications have been sent to her pharmacy  Time spent with patient: 35 minutes: medications; home health; dme.  Barnie Seip NP [facility]   call [phone]

## 2024-03-30 ENCOUNTER — Telehealth: Payer: Self-pay

## 2024-03-30 NOTE — Transitions of Care (Post Inpatient/ED Visit) (Unsigned)
   03/30/2024  Name: Brittany Mahoney MRN: [id number] DOB: Oct 03, 1934  Today's TOC FU Call Status: Today's TOC FU Call Status:: Unsuccessful Call (1st Attempt) Unsuccessful Call (1st Attempt) Date: 03/30/24  Attempted to reach the patient regarding the most recent Inpatient/ED visit.  Follow Up Plan: Additional outreach attempts will be made to reach the patient to complete the Transitions of Care (Post Inpatient/ED visit) call.   Signature  Avelina Essex, CMA (AAMA)  [facility] [phone]

## 2024-04-01 DIAGNOSIS — H409 Unspecified glaucoma: Secondary | ICD-10-CM | POA: Diagnosis not present

## 2024-04-01 DIAGNOSIS — B9620 Unspecified Escherichia coli [E. coli] as the cause of diseases classified elsewhere: Secondary | ICD-10-CM | POA: Diagnosis not present

## 2024-04-01 DIAGNOSIS — N390 Urinary tract infection, site not specified: Secondary | ICD-10-CM | POA: Diagnosis not present

## 2024-04-01 DIAGNOSIS — Z9181 History of falling: Secondary | ICD-10-CM | POA: Diagnosis not present

## 2024-04-01 DIAGNOSIS — M159 Polyosteoarthritis, unspecified: Secondary | ICD-10-CM | POA: Diagnosis not present

## 2024-04-01 DIAGNOSIS — F0390 Unspecified dementia without behavioral disturbance: Secondary | ICD-10-CM | POA: Diagnosis not present

## 2024-04-01 DIAGNOSIS — M8008XD Age-related osteoporosis with current pathological fracture, vertebra(e), subsequent encounter for fracture with routine healing: Secondary | ICD-10-CM | POA: Diagnosis not present

## 2024-04-01 DIAGNOSIS — E538 Deficiency of other specified B group vitamins: Secondary | ICD-10-CM | POA: Diagnosis not present

## 2024-04-02 DIAGNOSIS — N390 Urinary tract infection, site not specified: Secondary | ICD-10-CM | POA: Diagnosis not present

## 2024-04-02 DIAGNOSIS — B9620 Unspecified Escherichia coli [E. coli] as the cause of diseases classified elsewhere: Secondary | ICD-10-CM | POA: Diagnosis not present

## 2024-04-02 DIAGNOSIS — F0390 Unspecified dementia without behavioral disturbance: Secondary | ICD-10-CM | POA: Diagnosis not present

## 2024-04-02 DIAGNOSIS — M8008XD Age-related osteoporosis with current pathological fracture, vertebra(e), subsequent encounter for fracture with routine healing: Secondary | ICD-10-CM | POA: Diagnosis not present

## 2024-04-02 DIAGNOSIS — H409 Unspecified glaucoma: Secondary | ICD-10-CM | POA: Diagnosis not present

## 2024-04-02 DIAGNOSIS — M159 Polyosteoarthritis, unspecified: Secondary | ICD-10-CM | POA: Diagnosis not present

## 2024-04-03 ENCOUNTER — Inpatient Hospital Stay: Payer: Self-pay | Admitting: Nurse Practitioner

## 2024-04-06 ENCOUNTER — Ambulatory Visit

## 2024-04-06 VITALS — BP 111/70 | HR 91 | Ht 66.0 in | Wt 143.0 lb

## 2024-04-06 DIAGNOSIS — S32050G Wedge compression fracture of fifth lumbar vertebra, subsequent encounter for fracture with delayed healing: Secondary | ICD-10-CM | POA: Diagnosis not present

## 2024-04-06 NOTE — Progress Notes (Signed)
 Established Patient Office Visit  Subjective   Patient ID: Brittany Mahoney, female    DOB: 1935-08-25  Age: 89 y.o. MRN: [id number]  Chief Complaint  Patient presents with   Hospitalization Follow-up    HPI  Discharge Diagnoses:    Lumbar compression fracture/L5 compression fracture (HCC)    Fall at home, initial encounter   Hypokalemia   Acute UTI    Hospital Course: 03/10/24-03/12/24 Patient is a 89 year old female with OA, endometrial CA status post TAH cognitive impairment (MoCA 21/30) presents with a mechanical fall. The patient was in her recliner. She stood up with her walker and the walker slid out from under her. She fell and believes she hit her head and right side. Fortunately, the patient had her cell phone. She contacted her neighbor who came to help her. EMS was activated.  Patient has been living with her daughter who was helping her, unfortunately daughter has been hospitalized with medical illness and patient has been alone for over a week.  The patient reported low back pain, positional since her fall. In the ED, CT lumbar spine showed age-indeterminate L5 compression fracture with mild retropulsion UA positive for UTI   Assessment and Plan:  Mechanical fall with gait instability, lumbar compression fracture - At baseline, the patient ambulates with a walker - Now she has having difficulty getting up and transferring - PT evaluation recommended SNF - X-ray of the pelvis negative for fracture - CT for cervical spine and brain negative for traumatic injury but did show right scalp hematoma -Pain is controlled - Plan to discharge to SNF today   Acute urinary tract infection - UA positive for UTI, urine culture showed 60,000 colonies of E. coli,  - Patient received IV Rocephin  inpatient, transition to oral Keflex  for 4 more days to complete full course X 7 days    Hypokalemia - Replaced.  Potassium is at 3.7 at discharge   History of endometrial CA status post  TAH - Outpatient follow-up with oncology     Estimated body mass index is 22.19 kg/m as calculated from the following:   Height as of this encounter: 5' 6.5 (1.689 m).   Weight as of this encounter: 63.3 kg.  Patient Active Problem List   Diagnosis Date Noted   B12 deficiency 03/19/2024   Lumbar compression fracture (HCC) 03/10/2024   Fall at home, initial encounter 03/10/2024   Hypokalemia 03/10/2024   Acute UTI 03/10/2024   Current mild episode of major depressive disorder (HCC) 11/05/2022   Elevated serum creatinine 11/05/2022   Physical deconditioning 11/03/2021   OA (osteoarthritis) of knee 06/23/2021   Osteoporosis    Other abnormalities of gait and mobility    Dementia (HCC)    Personal history of (healed) traumatic fracture    Repeated falls    Tremor, unspecified    History of uterine cancer 04/01/2017   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  Constitutional:      General: She is not in acute distress.    Appearance: Normal appearance. She is not diaphoretic.  HENT:     Head: Normocephalic and atraumatic.     Nose: Nose normal.     Mouth/Throat:     Mouth: Mucous membranes are moist.  Eyes:     General: No scleral icterus.    Extraocular Movements: Extraocular movements intact.     Pupils: Pupils are equal, round, and reactive to light.  Cardiovascular:     Rate and Rhythm: Normal rate and regular rhythm.     Pulses: Normal pulses.     Heart sounds: Normal heart sounds. No murmur heard. Pulmonary:     Effort: Pulmonary effort is normal.     Breath sounds:  Normal breath sounds. No wheezing or rales.  Musculoskeletal:        General: Tenderness (B/l hips) present.     Cervical back: Normal range of motion and neck supple. No tenderness.     Right lower leg: No edema.     Left lower leg: No edema.  Skin:    General: Skin is warm.     Comments: Livedo reticularis over b/l legs, chronic  Neurological:     General: No focal deficit present.     Mental Status: She is alert and oriented to person, place, and time. Mental status is at baseline.     Motor: Weakness (3/5 in b/l LE, 2/5 in b/l LE) present.     Gait: Gait abnormal (ambulates with walker).  Psychiatric:        Mood and Affect: Mood normal.        Behavior: Behavior normal.        Thought Content: Thought content normal.      No results found for any visits on 04/06/24.  Last CBC Lab Results  Component Value Date   WBC 7.4 03/11/2024   HGB 14.3 03/11/2024   HCT 42.1 03/11/2024   MCV 93.1 03/11/2024   MCH 31.6 03/11/2024   RDW 12.5 03/11/2024   PLT 255 03/11/2024   Last metabolic panel Lab Results  Component Value Date   GLUCOSE 97 03/11/2024   NA 139 03/11/2024   K 3.7 03/11/2024   CL 109 03/11/2024   CO2 25 03/11/2024   BUN 12 03/11/2024   CREATININE 0.84 03/11/2024   GFRNONAA >60 03/11/2024   CALCIUM 8.9 03/11/2024   PROT 7.4 09/14/2020   ALBUMIN 4.1 04/20/2019   BILITOT 1.1 09/14/2020   ALKPHOS 88 04/20/2019   AST 13 09/14/2020   ALT 14 09/14/2020   ANIONGAP 5 03/11/2024      The ASCVD Risk score (Arnett DK, et al., 2019) failed to calculate for the following reasons:   The 2019 ASCVD risk score is only valid for ages 2 to 52    Assessment & Plan:   Problem List Items Addressed This Visit       Musculoskeletal and Integument   Lumbar compression fracture (HCC) - Primary   PT/OT at SNF as tolerated.       Return in about 3 months (around 07/07/2024) for chronic follow-up with PCP.    Leita Longs, FNP

## 2024-04-07 ENCOUNTER — Ambulatory Visit: Payer: Medicare Other | Admitting: Podiatry

## 2024-04-08 DIAGNOSIS — B9620 Unspecified Escherichia coli [E. coli] as the cause of diseases classified elsewhere: Secondary | ICD-10-CM | POA: Diagnosis not present

## 2024-04-08 DIAGNOSIS — M8008XD Age-related osteoporosis with current pathological fracture, vertebra(e), subsequent encounter for fracture with routine healing: Secondary | ICD-10-CM | POA: Diagnosis not present

## 2024-04-08 DIAGNOSIS — F0390 Unspecified dementia without behavioral disturbance: Secondary | ICD-10-CM | POA: Diagnosis not present

## 2024-04-08 DIAGNOSIS — N390 Urinary tract infection, site not specified: Secondary | ICD-10-CM | POA: Diagnosis not present

## 2024-04-08 DIAGNOSIS — H409 Unspecified glaucoma: Secondary | ICD-10-CM | POA: Diagnosis not present

## 2024-04-08 DIAGNOSIS — M159 Polyosteoarthritis, unspecified: Secondary | ICD-10-CM | POA: Diagnosis not present

## 2024-04-13 NOTE — Assessment & Plan Note (Signed)
PT/OT at SNF as tolerated. ?

## 2024-04-14 ENCOUNTER — Ambulatory Visit (INDEPENDENT_AMBULATORY_CARE_PROVIDER_SITE_OTHER): Admitting: Podiatry

## 2024-04-14 ENCOUNTER — Ambulatory Visit: Admitting: Podiatry

## 2024-04-14 ENCOUNTER — Telehealth: Payer: Self-pay

## 2024-04-14 DIAGNOSIS — M79675 Pain in left toe(s): Secondary | ICD-10-CM

## 2024-04-14 DIAGNOSIS — B351 Tinea unguium: Secondary | ICD-10-CM | POA: Diagnosis not present

## 2024-04-14 DIAGNOSIS — M79674 Pain in right toe(s): Secondary | ICD-10-CM | POA: Diagnosis not present

## 2024-04-14 NOTE — Telephone Encounter (Signed)
 Copied from CRM #[id number]. Topic: Clinical - Home Health Verbal Orders >> Apr 14, 2024  8:48 AM Rosaria BRAVO wrote: Caller/Agency: [facility]. Callback Number: [phone] Service Requested: Occupational Therapy Frequency:   Approval for plan of care  1w2  Following week a phone call  1w2 Following week a phone call  1w1  Any new concerns about the patient? No.

## 2024-04-14 NOTE — Telephone Encounter (Signed)
Verbal orders left on vm.

## 2024-04-15 ENCOUNTER — Ambulatory Visit: Payer: Self-pay

## 2024-04-15 DIAGNOSIS — B9620 Unspecified Escherichia coli [E. coli] as the cause of diseases classified elsewhere: Secondary | ICD-10-CM | POA: Diagnosis not present

## 2024-04-15 DIAGNOSIS — F0390 Unspecified dementia without behavioral disturbance: Secondary | ICD-10-CM | POA: Diagnosis not present

## 2024-04-15 DIAGNOSIS — H409 Unspecified glaucoma: Secondary | ICD-10-CM | POA: Diagnosis not present

## 2024-04-15 DIAGNOSIS — N390 Urinary tract infection, site not specified: Secondary | ICD-10-CM | POA: Diagnosis not present

## 2024-04-15 DIAGNOSIS — M159 Polyosteoarthritis, unspecified: Secondary | ICD-10-CM | POA: Diagnosis not present

## 2024-04-15 DIAGNOSIS — M8008XD Age-related osteoporosis with current pathological fracture, vertebra(e), subsequent encounter for fracture with routine healing: Secondary | ICD-10-CM | POA: Diagnosis not present

## 2024-04-15 NOTE — Telephone Encounter (Signed)
 FYI Only or Action Required?: FYI only for provider.  Patient was last seen in primary care on 04/06/2024 by Bevely Doffing, FNP.  Called Nurse Triage reporting Fall.  Symptoms began several days ago.  Interventions attempted: Nothing.  Symptoms are: unchanged.  Triage Disposition: Information or Advice Only Call  Copied from CRM [id number]. Topic: Clinical - Red Word Triage >> Apr 15, 2024  9:38 AM Elle L wrote: Red Word that prompted transfer to Nurse Triage: Sydelle, Physical Therapist with [facility], was calling to report that the patient had a fall on Monday going over a small step on her door and fell and hit her knee. She had no bleeding but she has soreness. She is currently with the patient. Answer Assessment - Initial Assessment Questions 1. MECHANISM: How did the fall happen?     Mechanical fall 3. ONSET: When did the fall happen? (e.g., minutes, hours, or days ago)     3 days ago 4. LOCATION: What part of the body hit the ground? (e.g., back, buttocks, head, hips, knees, hands, head, stomach)     Knee pain since fall, no bleeding  Pt does not want to be seen, PT calling per protocol.  Protocols used: [facility]

## 2024-04-16 DIAGNOSIS — H409 Unspecified glaucoma: Secondary | ICD-10-CM | POA: Diagnosis not present

## 2024-04-16 DIAGNOSIS — M159 Polyosteoarthritis, unspecified: Secondary | ICD-10-CM | POA: Diagnosis not present

## 2024-04-16 DIAGNOSIS — M8008XD Age-related osteoporosis with current pathological fracture, vertebra(e), subsequent encounter for fracture with routine healing: Secondary | ICD-10-CM | POA: Diagnosis not present

## 2024-04-16 DIAGNOSIS — B9620 Unspecified Escherichia coli [E. coli] as the cause of diseases classified elsewhere: Secondary | ICD-10-CM | POA: Diagnosis not present

## 2024-04-16 DIAGNOSIS — N390 Urinary tract infection, site not specified: Secondary | ICD-10-CM | POA: Diagnosis not present

## 2024-04-16 DIAGNOSIS — F0390 Unspecified dementia without behavioral disturbance: Secondary | ICD-10-CM | POA: Diagnosis not present

## 2024-04-17 ENCOUNTER — Other Ambulatory Visit: Payer: Self-pay | Admitting: *Deleted

## 2024-04-17 NOTE — Patient Outreach (Signed)
 Post-Acute Care Manager follow. Per [facility], Brittany Mahoney discharged from [facility] on 03/27/24. [facility] waiver was utilized for admission at the time.   [facility] is providing home health services.  [name], MSN, RN, BSN [place]  [facility] RN Post- Acute Care Manager Direct Dial: [phone]

## 2024-04-19 ENCOUNTER — Encounter: Payer: Self-pay | Admitting: Podiatry

## 2024-04-19 NOTE — Progress Notes (Signed)
  Subjective:  Patient ID: Brittany Mahoney, female    DOB: 07-16-1935,  MRN: [id number]  Brittany Mahoney presents to clinic today for painful thick toenails that are difficult to trim. Pain interferes with ambulation. Aggravating factors include wearing enclosed shoe gear. Pain is relieved with periodic professional debridement. She is accompanied by POA, Brittany Mahoney, on todays' visit. Patient states her daughter is ill. Brittany Mahoney does have help coming to her home to assist her. Chief Complaint  Patient presents with   Nail Problem    Thick painful toenails, 4 month follow up    New problem(s): None.   PCP is Brittany Suzzane POUR, MD.  Allergies  Allergen Reactions   Penicillins Hives and Swelling    Has patient had a PCN reaction causing immediate rash, facial/tongue/throat swelling, SOB or lightheadedness with hypotension: Yes Has patient had a PCN reaction causing severe rash involving mucus membranes or skin necrosis: No Has patient had a PCN reaction that required hospitalization No Has patient had a PCN reaction occurring within the last 10 years: No  If all of the above answers are NO, then may proceed with Cephalosporin use.    Codeine    Erythromycin    Morphine  And Codeine Other (See Comments)    loopy    Review of Systems: Negative except as noted in the HPI.  Objective: No changes noted in today's physical examination. There were no vitals filed for this visit. Brittany Mahoney is a pleasant 89 y.o. female WD, WN in NAD. AAO x 3.  Vascular Examination: CFT <3 seconds b/l. DP/PT pulses faintly palpable b/l. Digital hair absent.  Skin temperature gradient warm to warm b/l. No pain with calf compression. No ischemia or gangrene. No cyanosis or clubbing noted b/l. No edema noted b/l LE.   Neurological Examination: Sensation grossly intact b/l with 10 gram monofilament. Vibratory sensation intact b/l.   Dermatological Examination: Pedal skin warm and supple b/l.   No open wounds.  No interdigital macerations.  Toenails 1-5 b/l thick, discolored, elongated with subungual debris and pain on dorsal palpation.    Musculoskeletal Examination: Muscle strength 5/5 to all lower extremity muscle groups bilaterally. Pes planovalgus deformity noted b/l lower extremities right >left.  Radiographs: None  Assessment/Plan: 1. Pain due to onychomycosis of toenails of both feet   Patient was evaluated and treated. All patient's and/or POA's questions/concerns addressed on today's visit. Mycotic toenails 1-5 debrided in length and girth without incident. Continue soft, supportive shoe gear daily. Report any pedal injuries to medical professional. Call office if there are any quesitons/concerns. -Patient/POA to call should there be question/concern in the interim.   Return in about 3 months (around 07/15/2024).  Brittany Mahoney      [place] LOCATION: [address] [place] [address]                   Office [phone]   [facility] LOCATION: [address] [place], [place] [address] Office [phone]

## 2024-04-28 DIAGNOSIS — M159 Polyosteoarthritis, unspecified: Secondary | ICD-10-CM | POA: Diagnosis not present

## 2024-04-28 DIAGNOSIS — M8008XD Age-related osteoporosis with current pathological fracture, vertebra(e), subsequent encounter for fracture with routine healing: Secondary | ICD-10-CM | POA: Diagnosis not present

## 2024-04-28 DIAGNOSIS — B9620 Unspecified Escherichia coli [E. coli] as the cause of diseases classified elsewhere: Secondary | ICD-10-CM | POA: Diagnosis not present

## 2024-04-28 DIAGNOSIS — H409 Unspecified glaucoma: Secondary | ICD-10-CM | POA: Diagnosis not present

## 2024-04-28 DIAGNOSIS — N390 Urinary tract infection, site not specified: Secondary | ICD-10-CM | POA: Diagnosis not present

## 2024-04-28 DIAGNOSIS — F0390 Unspecified dementia without behavioral disturbance: Secondary | ICD-10-CM | POA: Diagnosis not present

## 2024-04-29 DIAGNOSIS — B9620 Unspecified Escherichia coli [E. coli] as the cause of diseases classified elsewhere: Secondary | ICD-10-CM | POA: Diagnosis not present

## 2024-04-29 DIAGNOSIS — F0390 Unspecified dementia without behavioral disturbance: Secondary | ICD-10-CM | POA: Diagnosis not present

## 2024-04-29 DIAGNOSIS — M159 Polyosteoarthritis, unspecified: Secondary | ICD-10-CM | POA: Diagnosis not present

## 2024-04-29 DIAGNOSIS — N390 Urinary tract infection, site not specified: Secondary | ICD-10-CM | POA: Diagnosis not present

## 2024-04-29 DIAGNOSIS — M8008XD Age-related osteoporosis with current pathological fracture, vertebra(e), subsequent encounter for fracture with routine healing: Secondary | ICD-10-CM | POA: Diagnosis not present

## 2024-04-29 DIAGNOSIS — H409 Unspecified glaucoma: Secondary | ICD-10-CM | POA: Diagnosis not present

## 2024-05-01 DIAGNOSIS — Z9181 History of falling: Secondary | ICD-10-CM | POA: Diagnosis not present

## 2024-05-01 DIAGNOSIS — E538 Deficiency of other specified B group vitamins: Secondary | ICD-10-CM | POA: Diagnosis not present

## 2024-05-01 DIAGNOSIS — F0390 Unspecified dementia without behavioral disturbance: Secondary | ICD-10-CM | POA: Diagnosis not present

## 2024-05-01 DIAGNOSIS — M159 Polyosteoarthritis, unspecified: Secondary | ICD-10-CM | POA: Diagnosis not present

## 2024-05-01 DIAGNOSIS — B9620 Unspecified Escherichia coli [E. coli] as the cause of diseases classified elsewhere: Secondary | ICD-10-CM | POA: Diagnosis not present

## 2024-05-01 DIAGNOSIS — M8008XD Age-related osteoporosis with current pathological fracture, vertebra(e), subsequent encounter for fracture with routine healing: Secondary | ICD-10-CM | POA: Diagnosis not present

## 2024-05-01 DIAGNOSIS — H409 Unspecified glaucoma: Secondary | ICD-10-CM | POA: Diagnosis not present

## 2024-05-01 DIAGNOSIS — N390 Urinary tract infection, site not specified: Secondary | ICD-10-CM | POA: Diagnosis not present

## 2024-05-04 DIAGNOSIS — N390 Urinary tract infection, site not specified: Secondary | ICD-10-CM | POA: Diagnosis not present

## 2024-05-04 DIAGNOSIS — F0390 Unspecified dementia without behavioral disturbance: Secondary | ICD-10-CM | POA: Diagnosis not present

## 2024-05-04 DIAGNOSIS — M8008XD Age-related osteoporosis with current pathological fracture, vertebra(e), subsequent encounter for fracture with routine healing: Secondary | ICD-10-CM | POA: Diagnosis not present

## 2024-05-04 DIAGNOSIS — B9620 Unspecified Escherichia coli [E. coli] as the cause of diseases classified elsewhere: Secondary | ICD-10-CM | POA: Diagnosis not present

## 2024-05-04 DIAGNOSIS — H409 Unspecified glaucoma: Secondary | ICD-10-CM | POA: Diagnosis not present

## 2024-05-04 DIAGNOSIS — M159 Polyosteoarthritis, unspecified: Secondary | ICD-10-CM | POA: Diagnosis not present

## 2024-05-06 DIAGNOSIS — M8008XD Age-related osteoporosis with current pathological fracture, vertebra(e), subsequent encounter for fracture with routine healing: Secondary | ICD-10-CM | POA: Diagnosis not present

## 2024-05-06 DIAGNOSIS — F0390 Unspecified dementia without behavioral disturbance: Secondary | ICD-10-CM | POA: Diagnosis not present

## 2024-05-06 DIAGNOSIS — B9620 Unspecified Escherichia coli [E. coli] as the cause of diseases classified elsewhere: Secondary | ICD-10-CM | POA: Diagnosis not present

## 2024-05-06 DIAGNOSIS — N390 Urinary tract infection, site not specified: Secondary | ICD-10-CM | POA: Diagnosis not present

## 2024-05-06 DIAGNOSIS — H409 Unspecified glaucoma: Secondary | ICD-10-CM | POA: Diagnosis not present

## 2024-05-06 DIAGNOSIS — M159 Polyosteoarthritis, unspecified: Secondary | ICD-10-CM | POA: Diagnosis not present

## 2024-05-15 DIAGNOSIS — B9620 Unspecified Escherichia coli [E. coli] as the cause of diseases classified elsewhere: Secondary | ICD-10-CM | POA: Diagnosis not present

## 2024-05-15 DIAGNOSIS — M8008XD Age-related osteoporosis with current pathological fracture, vertebra(e), subsequent encounter for fracture with routine healing: Secondary | ICD-10-CM | POA: Diagnosis not present

## 2024-05-15 DIAGNOSIS — N390 Urinary tract infection, site not specified: Secondary | ICD-10-CM | POA: Diagnosis not present

## 2024-05-15 DIAGNOSIS — M159 Polyosteoarthritis, unspecified: Secondary | ICD-10-CM | POA: Diagnosis not present

## 2024-05-15 DIAGNOSIS — F0390 Unspecified dementia without behavioral disturbance: Secondary | ICD-10-CM | POA: Diagnosis not present

## 2024-05-15 DIAGNOSIS — H409 Unspecified glaucoma: Secondary | ICD-10-CM | POA: Diagnosis not present

## 2024-05-18 DIAGNOSIS — H409 Unspecified glaucoma: Secondary | ICD-10-CM | POA: Diagnosis not present

## 2024-05-18 DIAGNOSIS — N390 Urinary tract infection, site not specified: Secondary | ICD-10-CM | POA: Diagnosis not present

## 2024-05-18 DIAGNOSIS — F0390 Unspecified dementia without behavioral disturbance: Secondary | ICD-10-CM | POA: Diagnosis not present

## 2024-05-18 DIAGNOSIS — M8008XD Age-related osteoporosis with current pathological fracture, vertebra(e), subsequent encounter for fracture with routine healing: Secondary | ICD-10-CM | POA: Diagnosis not present

## 2024-05-18 DIAGNOSIS — B9620 Unspecified Escherichia coli [E. coli] as the cause of diseases classified elsewhere: Secondary | ICD-10-CM | POA: Diagnosis not present

## 2024-05-18 DIAGNOSIS — M159 Polyosteoarthritis, unspecified: Secondary | ICD-10-CM | POA: Diagnosis not present

## 2024-05-22 DIAGNOSIS — F0390 Unspecified dementia without behavioral disturbance: Secondary | ICD-10-CM | POA: Diagnosis not present

## 2024-05-22 DIAGNOSIS — M159 Polyosteoarthritis, unspecified: Secondary | ICD-10-CM | POA: Diagnosis not present

## 2024-05-22 DIAGNOSIS — N390 Urinary tract infection, site not specified: Secondary | ICD-10-CM | POA: Diagnosis not present

## 2024-05-22 DIAGNOSIS — H409 Unspecified glaucoma: Secondary | ICD-10-CM | POA: Diagnosis not present

## 2024-05-22 DIAGNOSIS — B9620 Unspecified Escherichia coli [E. coli] as the cause of diseases classified elsewhere: Secondary | ICD-10-CM | POA: Diagnosis not present

## 2024-05-22 DIAGNOSIS — M8008XD Age-related osteoporosis with current pathological fracture, vertebra(e), subsequent encounter for fracture with routine healing: Secondary | ICD-10-CM | POA: Diagnosis not present

## 2024-05-26 DIAGNOSIS — M8008XD Age-related osteoporosis with current pathological fracture, vertebra(e), subsequent encounter for fracture with routine healing: Secondary | ICD-10-CM | POA: Diagnosis not present

## 2024-05-26 DIAGNOSIS — F0390 Unspecified dementia without behavioral disturbance: Secondary | ICD-10-CM | POA: Diagnosis not present

## 2024-05-26 DIAGNOSIS — N390 Urinary tract infection, site not specified: Secondary | ICD-10-CM | POA: Diagnosis not present

## 2024-05-26 DIAGNOSIS — H409 Unspecified glaucoma: Secondary | ICD-10-CM | POA: Diagnosis not present

## 2024-05-26 DIAGNOSIS — M159 Polyosteoarthritis, unspecified: Secondary | ICD-10-CM | POA: Diagnosis not present

## 2024-05-26 DIAGNOSIS — B9620 Unspecified Escherichia coli [E. coli] as the cause of diseases classified elsewhere: Secondary | ICD-10-CM | POA: Diagnosis not present

## 2024-06-16 ENCOUNTER — Ambulatory Visit (INDEPENDENT_AMBULATORY_CARE_PROVIDER_SITE_OTHER): Admitting: Podiatry

## 2024-06-16 DIAGNOSIS — Z91199 Patient's noncompliance with other medical treatment and regimen due to unspecified reason: Secondary | ICD-10-CM

## 2024-06-16 NOTE — Progress Notes (Signed)
 1. No-show for appointment

## 2024-06-19 ENCOUNTER — Encounter (HOSPITAL_COMMUNITY): Payer: Self-pay | Admitting: Emergency Medicine

## 2024-06-19 ENCOUNTER — Inpatient Hospital Stay (HOSPITAL_COMMUNITY)
Admission: EM | Admit: 2024-06-19 | Discharge: 2024-06-21 | DRG: 689 | Disposition: A | Attending: Family Medicine | Admitting: Family Medicine

## 2024-06-19 ENCOUNTER — Emergency Department (HOSPITAL_COMMUNITY)

## 2024-06-19 ENCOUNTER — Other Ambulatory Visit: Payer: Self-pay

## 2024-06-19 DIAGNOSIS — R519 Headache, unspecified: Secondary | ICD-10-CM | POA: Diagnosis not present

## 2024-06-19 DIAGNOSIS — M25561 Pain in right knee: Secondary | ICD-10-CM | POA: Diagnosis not present

## 2024-06-19 DIAGNOSIS — Z043 Encounter for examination and observation following other accident: Secondary | ICD-10-CM | POA: Diagnosis not present

## 2024-06-19 DIAGNOSIS — Y92009 Unspecified place in unspecified non-institutional (private) residence as the place of occurrence of the external cause: Secondary | ICD-10-CM | POA: Diagnosis not present

## 2024-06-19 DIAGNOSIS — R4182 Altered mental status, unspecified: Secondary | ICD-10-CM | POA: Diagnosis not present

## 2024-06-19 DIAGNOSIS — N390 Urinary tract infection, site not specified: Secondary | ICD-10-CM | POA: Diagnosis present

## 2024-06-19 DIAGNOSIS — Z885 Allergy status to narcotic agent status: Secondary | ICD-10-CM | POA: Diagnosis not present

## 2024-06-19 DIAGNOSIS — Y92000 Kitchen of unspecified non-institutional (private) residence as  the place of occurrence of the external cause: Secondary | ICD-10-CM

## 2024-06-19 DIAGNOSIS — R748 Abnormal levels of other serum enzymes: Secondary | ICD-10-CM | POA: Diagnosis not present

## 2024-06-19 DIAGNOSIS — Z23 Encounter for immunization: Secondary | ICD-10-CM

## 2024-06-19 DIAGNOSIS — R296 Repeated falls: Secondary | ICD-10-CM | POA: Diagnosis present

## 2024-06-19 DIAGNOSIS — F0390 Unspecified dementia without behavioral disturbance: Secondary | ICD-10-CM | POA: Diagnosis present

## 2024-06-19 DIAGNOSIS — Z833 Family history of diabetes mellitus: Secondary | ICD-10-CM

## 2024-06-19 DIAGNOSIS — Z90710 Acquired absence of both cervix and uterus: Secondary | ICD-10-CM

## 2024-06-19 DIAGNOSIS — Z8542 Personal history of malignant neoplasm of other parts of uterus: Secondary | ICD-10-CM | POA: Diagnosis not present

## 2024-06-19 DIAGNOSIS — M1711 Unilateral primary osteoarthritis, right knee: Secondary | ICD-10-CM | POA: Diagnosis not present

## 2024-06-19 DIAGNOSIS — R410 Disorientation, unspecified: Secondary | ICD-10-CM | POA: Diagnosis not present

## 2024-06-19 DIAGNOSIS — G9341 Metabolic encephalopathy: Secondary | ICD-10-CM | POA: Diagnosis present

## 2024-06-19 DIAGNOSIS — Z9079 Acquired absence of other genital organ(s): Secondary | ICD-10-CM | POA: Diagnosis not present

## 2024-06-19 DIAGNOSIS — Z880 Allergy status to penicillin: Secondary | ICD-10-CM

## 2024-06-19 DIAGNOSIS — R7401 Elevation of levels of liver transaminase levels: Secondary | ICD-10-CM | POA: Diagnosis present

## 2024-06-19 DIAGNOSIS — N3000 Acute cystitis without hematuria: Principal | ICD-10-CM | POA: Diagnosis present

## 2024-06-19 DIAGNOSIS — Z66 Do not resuscitate: Secondary | ICD-10-CM | POA: Diagnosis present

## 2024-06-19 DIAGNOSIS — R319 Hematuria, unspecified: Secondary | ICD-10-CM | POA: Diagnosis not present

## 2024-06-19 DIAGNOSIS — R102 Pelvic and perineal pain unspecified side: Secondary | ICD-10-CM | POA: Diagnosis not present

## 2024-06-19 DIAGNOSIS — Z881 Allergy status to other antibiotic agents status: Secondary | ICD-10-CM | POA: Diagnosis not present

## 2024-06-19 DIAGNOSIS — G9340 Encephalopathy, unspecified: Secondary | ICD-10-CM | POA: Diagnosis not present

## 2024-06-19 DIAGNOSIS — Z90722 Acquired absence of ovaries, bilateral: Secondary | ICD-10-CM

## 2024-06-19 DIAGNOSIS — Z8249 Family history of ischemic heart disease and other diseases of the circulatory system: Secondary | ICD-10-CM

## 2024-06-19 DIAGNOSIS — Z79899 Other long term (current) drug therapy: Secondary | ICD-10-CM

## 2024-06-19 DIAGNOSIS — R000 Tachycardia, unspecified: Secondary | ICD-10-CM | POA: Diagnosis not present

## 2024-06-19 DIAGNOSIS — W19XXXA Unspecified fall, initial encounter: Principal | ICD-10-CM

## 2024-06-19 DIAGNOSIS — R404 Transient alteration of awareness: Secondary | ICD-10-CM | POA: Diagnosis not present

## 2024-06-19 DIAGNOSIS — M542 Cervicalgia: Secondary | ICD-10-CM | POA: Diagnosis not present

## 2024-06-19 DIAGNOSIS — R402411 Glasgow coma scale score 13-15, in the field [EMT or ambulance]: Secondary | ICD-10-CM | POA: Diagnosis not present

## 2024-06-19 DIAGNOSIS — M25461 Effusion, right knee: Secondary | ICD-10-CM | POA: Diagnosis not present

## 2024-06-19 LAB — COMPREHENSIVE METABOLIC PANEL WITH GFR
ALT: 57 U/L — ABNORMAL HIGH (ref 0–44)
AST: 53 U/L — ABNORMAL HIGH (ref 15–41)
Albumin: 3.9 g/dL (ref 3.5–5.0)
Alkaline Phosphatase: 121 U/L (ref 38–126)
Anion gap: 12 (ref 5–15)
BUN: 12 mg/dL (ref 8–23)
CO2: 26 mmol/L (ref 22–32)
Calcium: 9.8 mg/dL (ref 8.9–10.3)
Chloride: 99 mmol/L (ref 98–111)
Creatinine, Ser: 1.01 mg/dL — ABNORMAL HIGH (ref 0.44–1.00)
GFR, Estimated: 54 mL/min — ABNORMAL LOW (ref >60)
Glucose, Bld: 152 mg/dL — ABNORMAL HIGH (ref 70–99)
Potassium: 4 mmol/L (ref 3.5–5.1)
Sodium: 137 mmol/L (ref 135–145)
Total Bilirubin: 1.1 mg/dL (ref 0.0–1.2)
Total Protein: 7 g/dL (ref 6.5–8.1)

## 2024-06-19 LAB — URINALYSIS, W/ REFLEX TO CULTURE (INFECTION SUSPECTED)
Bilirubin Urine: NEGATIVE
Glucose, UA: NEGATIVE mg/dL
Ketones, ur: NEGATIVE mg/dL
Leukocytes,Ua: NEGATIVE
Nitrite: NEGATIVE
Protein, ur: 30 mg/dL — AB
Specific Gravity, Urine: 1.014 (ref 1.005–1.030)
WBC, UA: >50 WBC/hpf (ref 0–5)
pH: 6 (ref 5.0–8.0)

## 2024-06-19 LAB — CBC WITH DIFFERENTIAL/PLATELET
Abs Immature Granulocytes: 0.03 K/uL (ref 0.00–0.07)
Basophils Absolute: 0 K/uL (ref 0.0–0.1)
Basophils Relative: 0 %
Eosinophils Absolute: 0 K/uL (ref 0.0–0.5)
Eosinophils Relative: 0 %
HCT: 43.6 % (ref 36.0–46.0)
Hemoglobin: 14.8 g/dL (ref 12.0–15.0)
Immature Granulocytes: 0 %
Lymphocytes Relative: 4 %
Lymphs Abs: 0.4 K/uL — ABNORMAL LOW (ref 0.7–4.0)
MCH: 31.1 pg (ref 26.0–34.0)
MCHC: 33.9 g/dL (ref 30.0–36.0)
MCV: 91.6 fL (ref 80.0–100.0)
Monocytes Absolute: 0.6 K/uL (ref 0.1–1.0)
Monocytes Relative: 6 %
Neutro Abs: 9.2 K/uL — ABNORMAL HIGH (ref 1.7–7.7)
Neutrophils Relative %: 90 %
Platelets: 262 K/uL (ref 150–400)
RBC: 4.76 MIL/uL (ref 3.87–5.11)
RDW: 12.4 % (ref 11.5–15.5)
WBC: 10.2 K/uL (ref 4.0–10.5)
nRBC: 0 % (ref 0.0–0.2)

## 2024-06-19 LAB — URINE DRUG SCREEN
Amphetamines: NEGATIVE
Barbiturates: NEGATIVE
Benzodiazepines: NEGATIVE
Cocaine: NEGATIVE
Fentanyl: NEGATIVE
Methadone Scn, Ur: NEGATIVE
Opiates: NEGATIVE
Tetrahydrocannabinol: NEGATIVE

## 2024-06-19 LAB — CK: Total CK: 247 U/L — ABNORMAL HIGH (ref 38–234)

## 2024-06-19 LAB — AMMONIA: Ammonia: 13 umol/L (ref 9–35)

## 2024-06-19 LAB — TSH: TSH: 1.28 u[IU]/mL (ref 0.350–4.500)

## 2024-06-19 LAB — ETHANOL: Alcohol, Ethyl (B): <15 mg/dL (ref <15)

## 2024-06-19 MED ORDER — SODIUM CHLORIDE 0.9 % IV BOLUS
1000.0000 mL | Freq: Once | INTRAVENOUS | Status: AC
  Administered 2024-06-19: 1000 mL via INTRAVENOUS

## 2024-06-19 MED ORDER — CEFTRIAXONE SODIUM 1 G IJ SOLR
1.0000 g | Freq: Once | INTRAMUSCULAR | Status: AC
  Administered 2024-06-20: 1 g via INTRAVENOUS
  Filled 2024-06-19: qty 10

## 2024-06-19 NOTE — ED Notes (Signed)
 ED Provider at bedside.

## 2024-06-19 NOTE — ED Triage Notes (Signed)
 Pt arrives via EMS from home after unwitnessed fall with unknown downtime. Pt lives at home alone with people that come check on her frequently. Pt was found on the ground in kitchen. Pt states unknown reason for falling but denies any complaints or injuries. C-collar placed by EMS.

## 2024-06-19 NOTE — ED Notes (Signed)
 Patient transported to CT

## 2024-06-19 NOTE — H&P (Signed)
 History and Physical    Patient: Brittany Mahoney FMW:[id number] DOB: Oct 05, 1934 DOA: 06/19/2024 DOS: the patient was seen and examined on 06/20/2024 PCP: [name], MD  Patient coming from: Home  Chief Complaint:  Chief Complaint  Patient presents with   Fall   HPI: Brittany Mahoney is a 89 y.o. female with medical history significant of OA of knee, endometrial cancer s/p TAH, cognitive impairment (MoCA 21/30) who presents to the emergency department from home via EMS due to fall at home.  Patient was unable to provide history, history was obtained from EDP and ED medical record.  Per report, patient lives alone and sustained a fall, so she started to yell till a neighbor found on the ground in the kitchen.  Family member states that patient is similar episode before was noted to have UTI and she has been complaining of some UTI symptoms.  Patient does not know how she fell.  She was recently admitted here from 7/1 to 7/30 due to mechanical fall with gait instability, lumbar compression fracture and acute urinary tract infection which was treated with IV Rocephin  inpatient and transition to oral Keflex  for 4 more days to complete full course for 7 days.  ED Course:  In the emergency department, she was hemodynamically stable.  Workup in the ED showed normal CBC, BMP was normal except for blood glucose of 152, creatinine 1.01.  AST 53, ALT 57, ALP 121, total CK 247, alcohol level was less than 15, urinalysis was suggestive of UTI, ammonia 15. Right knee x-ray showed moderate close arthritis with small joint effusion but no acute bony abnormality Chest x-ray showed no acute cardiopulmonary disease CT head and CT cervical spine showed no acute intracranial abnormality and multilevel degenerative change without acute abnormality. She was treated with IV ceftriaxone , IV hydration was provided.  Review of Systems: Review of systems as noted in the HPI. All other systems reviewed and are  negative.   Past Medical History:  Diagnosis Date   Age-related osteoporosis without current pathological fracture    Arthritis    Arthropathy, unspecified    Broken hip (HCC)    Cancer (HCC)    uterus   Chronic kidney disease, stage 1    Closed right hip fracture (HCC) 04/09/2016   Endometrial cancer, grade I (HCC) 03/21/2015   Fracture of unspecified part of neck of right femur, sequela    Glaucoma    Headache(784.0)    migraines after periods   Hepatitis    Hx of cancer of endometrium 03/06/2013   Osteoporosis    Other abnormalities of gait and mobility    Other amnesia    Pain in left knee    Pain in right foot    Pain in right shoulder    Personal history of (healed) traumatic fracture    PMB (postmenopausal bleeding)    Repeated falls    Tremor, unspecified    Past Surgical History:  Procedure Laterality Date   ABDOMINAL HYSTERECTOMY  2007   TAH and BSO   COLONOSCOPY  07/31/2006   Dr.Rourk   DILATION AND CURETTAGE OF UTERUS     HIP FRACTURE SURGERY     INTRAMEDULLARY (IM) NAIL INTERTROCHANTERIC Right 04/10/2016   Procedure: INTRAMEDULLARY (IM) NAIL RIGHT HIP FRACTURE;  Surgeon: Lonni CINDERELLA Poli, MD;  Location: [facility] OR;  Service: Orthopedics;  Laterality: Right;    Social History:  reports that she has never smoked. She has never used smokeless tobacco. She reports that she  does not drink alcohol and does not use drugs.   Allergies  Allergen Reactions   Penicillins Hives and Swelling    Has patient had a PCN reaction causing immediate rash, facial/tongue/throat swelling, SOB or lightheadedness with hypotension: Yes Has patient had a PCN reaction causing severe rash involving mucus membranes or skin necrosis: No Has patient had a PCN reaction that required hospitalization No Has patient had a PCN reaction occurring within the last 10 years: No  If all of the above answers are NO, then may proceed with Cephalosporin use.    Codeine    Erythromycin     Morphine  And Codeine Other (See Comments)    loopy    Family History  Problem Relation Age of Onset   Stroke Maternal Grandfather    Stroke Paternal Grandfather    Cancer Mother    Cancer Father        lymphatic sarcoma   Early death Sister    Early death Son    Diabetes Son    Heart disease Other    Cancer Other    Tuberculosis Other    Diabetes Other    Thyroid  disease Other    Stroke Other    Heart disease Maternal Aunt    Heart disease Maternal Uncle      Prior to Admission medications   Medication Sig Start Date End Date Taking? Authorizing Provider  acetaminophen  (TYLENOL ) 500 MG tablet Take 2 tablets (1,000 mg total) by mouth every 8 (eight) hours as needed for mild pain (pain score 1-3). 03/12/24   Rai, Ripudeep K, MD  Calcium Carb-Cholecalciferol (CALCIUM CARBONATE-VITAMIN D3 PO) Take 500 mg by mouth daily.    [provider]  cyanocobalamin  (VITAMIN B12) 1000 MCG tablet Take 1,000 mcg by mouth daily.    [provider]  latanoprost  (XALATAN ) 0.005 % ophthalmic solution Place 1 drop into both eyes daily. 03/27/24   Landy Barnie RAMAN, NP  LUMIGAN  0.01 % SOLN Place 1 drop into both eyes at bedtime. 03/27/24   Landy Barnie RAMAN, NP    Physical Exam: BP 130/62 (BP Location: Left Arm)   Pulse 95   Temp 98.5 F (36.9 C) (Oral)   Resp 16   Ht 5' 6 (1.676 m)   Wt 61.3 kg   SpO2 97%   BMI 21.81 kg/m   General: 89 y.o. year-old female well developed well nourished in no acute distress.  Alert and oriented x3. HEENT: NCAT, EOMI Neck: Supple, trachea medial Cardiovascular: Regular rate and rhythm with no rubs or gallops.  No thyromegaly or JVD noted.  No lower extremity edema. 2/4 pulses in all 4 extremities. Respiratory: Clear to auscultation with no wheezes or rales. Good inspiratory effort. Abdomen: Soft, nontender nondistended with normal bowel sounds x4 quadrants. Muskuloskeletal: Normal range of motion of lower extremities.  No cyanosis, clubbing or  edema noted bilaterally Neuro: CN II-XII intact, strength 5/5 x 4, sensation, reflexes intact Skin: No ulcerative lesions noted or rashes Psychiatry: Judgement and insight appear normal. Mood is appropriate for condition and setting          Labs on Admission:  Basic Metabolic Panel: Recent Labs  Lab 06/19/24 2125  NA 137  K 4.0  CL 99  CO2 26  GLUCOSE 152*  BUN 12  CREATININE 1.01*  CALCIUM 9.8   Liver Function Tests: Recent Labs  Lab 06/19/24 2125  AST 53*  ALT 57*  ALKPHOS 121  BILITOT 1.1  PROT 7.0  ALBUMIN 3.9  No results for input(s): LIPASE, AMYLASE in the last 168 hours. Recent Labs  Lab 06/19/24 2125  AMMONIA 13   CBC: Recent Labs  Lab 06/19/24 2125  WBC 10.2  NEUTROABS 9.2*  HGB 14.8  HCT 43.6  MCV 91.6  PLT 262   Cardiac Enzymes: Recent Labs  Lab 06/19/24 2125  CKTOTAL 247*    BNP (last 3 results) No results for input(s): BNP in the last 8760 hours.  ProBNP (last 3 results) No results for input(s): PROBNP in the last 8760 hours.  CBG: No results for input(s): GLUCAP in the last 168 hours.  Radiological Exams on Admission: DG Chest 1 View Result Date: 06/19/2024 CLINICAL DATA:  Fall EXAM: CHEST  1 VIEW COMPARISON:  04/09/2016 FINDINGS: Heart and mediastinal contours are within normal limits. No focal opacities or effusions. No acute bony abnormality. No pneumothorax. Thoracolumbar scoliosis. IMPRESSION: No active cardiopulmonary disease. Electronically Signed   By: Franky Crease M.D.   On: 06/19/2024 22:12   DG Knee Complete 4 Views Right Result Date: 06/19/2024 CLINICAL DATA:  Fall, pain EXAM: RIGHT KNEE - COMPLETE 4+ VIEW COMPARISON:  None Available. FINDINGS: Hardware partially visualized in the distal femur. Moderate tricompartmental osteoarthritis with joint space narrowing and spurring. Small joint effusion. No acute bony abnormality. Specifically, no fracture, subluxation, or dislocation. IMPRESSION: Moderate  osteoarthritis with small joint effusion. No acute bony abnormality. Electronically Signed   By: Franky Crease M.D.   On: 06/19/2024 22:11   DG Pelvis 1-2 Views Result Date: 06/19/2024 CLINICAL DATA:  Fall, pain EXAM: PELVIS - 1-2 VIEW COMPARISON:  03/10/2024 FINDINGS: Hardware noted in the right hip, unchanged. Hip joints and SI joints symmetric. No acute bony abnormality. Specifically, no fracture, subluxation, or dislocation. IMPRESSION: No acute bony abnormality. Electronically Signed   By: Franky Crease M.D.   On: 06/19/2024 22:10   CT Head Wo Contrast Result Date: 06/19/2024 CLINICAL DATA:  Un witnessed fall with headaches and neck pain, initial encounter EXAM: CT HEAD WITHOUT CONTRAST CT CERVICAL SPINE WITHOUT CONTRAST TECHNIQUE: Multidetector CT imaging of the head and cervical spine was performed following the standard protocol without intravenous contrast. Multiplanar CT image reconstructions of the cervical spine were also generated. RADIATION DOSE REDUCTION: This exam was performed according to the departmental dose-optimization program which includes automated exposure control, adjustment of the mA and/or kV according to patient size and/or use of iterative reconstruction technique. COMPARISON:  03/10/2024 FINDINGS: CT HEAD FINDINGS Brain: No evidence of acute infarction, hemorrhage, hydrocephalus, extra-axial collection or mass lesion/mass effect. Scattered hypodense foci are noted within the left lateral ventricle as well as in the subarachnoid space and along the skull base near the cavernous sinus. These were not present on the prior exam. Vascular: No hyperdense vessel or unexpected calcification. Skull: Normal. Negative for fracture or focal lesion. Sinuses/Orbits: No acute finding. Other: None. CT CERVICAL SPINE FINDINGS Alignment: Loss of the normal cervical lordosis is noted. Skull base and vertebrae: 7 cervical segments are well visualized. Multilevel disc space narrowing and osteophytic  changes are seen. Facet hypertrophic changes are noted as well. Mild anterolisthesis of C3 on C4 and C4 on C5 is noted. The odontoid is within normal limits. No acute fracture or acute facet abnormality is noted. Soft tissues and spinal canal: Surrounding soft tissue structures are within normal limits. Upper chest: Visualized lung apices are unremarkable. Other: None IMPRESSION: CT of the head: No acute intracranial abnormality noted. Scattered hypodense foci are noted as described likely representing venous air from  recent IV start. CT of the cervical spine: Multilevel degenerative change without acute abnormality. Electronically Signed   By: Oneil Devonshire M.D.   On: 06/19/2024 22:01   CT Cervical Spine Wo Contrast Result Date: 06/19/2024 CLINICAL DATA:  Un witnessed fall with headaches and neck pain, initial encounter EXAM: CT HEAD WITHOUT CONTRAST CT CERVICAL SPINE WITHOUT CONTRAST TECHNIQUE: Multidetector CT imaging of the head and cervical spine was performed following the standard protocol without intravenous contrast. Multiplanar CT image reconstructions of the cervical spine were also generated. RADIATION DOSE REDUCTION: This exam was performed according to the departmental dose-optimization program which includes automated exposure control, adjustment of the mA and/or kV according to patient size and/or use of iterative reconstruction technique. COMPARISON:  03/10/2024 FINDINGS: CT HEAD FINDINGS Brain: No evidence of acute infarction, hemorrhage, hydrocephalus, extra-axial collection or mass lesion/mass effect. Scattered hypodense foci are noted within the left lateral ventricle as well as in the subarachnoid space and along the skull base near the cavernous sinus. These were not present on the prior exam. Vascular: No hyperdense vessel or unexpected calcification. Skull: Normal. Negative for fracture or focal lesion. Sinuses/Orbits: No acute finding. Other: None. CT CERVICAL SPINE FINDINGS Alignment:  Loss of the normal cervical lordosis is noted. Skull base and vertebrae: 7 cervical segments are well visualized. Multilevel disc space narrowing and osteophytic changes are seen. Facet hypertrophic changes are noted as well. Mild anterolisthesis of C3 on C4 and C4 on C5 is noted. The odontoid is within normal limits. No acute fracture or acute facet abnormality is noted. Soft tissues and spinal canal: Surrounding soft tissue structures are within normal limits. Upper chest: Visualized lung apices are unremarkable. Other: None IMPRESSION: CT of the head: No acute intracranial abnormality noted. Scattered hypodense foci are noted as described likely representing venous air from recent IV start. CT of the cervical spine: Multilevel degenerative change without acute abnormality. Electronically Signed   By: Oneil Devonshire M.D.   On: 06/19/2024 22:01    EKG: I independently viewed the EKG done and my findings are as followed: Sinus tachycardia at a rate of 101 bpm  Assessment/Plan Present on Admission:  UTI (urinary tract infection)  Principal Problem:   UTI (urinary tract infection) Active Problems:   Fall at home, initial encounter   Transaminasemia   Elevated CPK  UTI POA Urinalysis was positive for UTI Continue IV ceftriaxone  Urine culture pending  Fall at home Continue fall precaution Continue PT/OT eval and treat Patient has had prior falls/she lives alone, consider placement prior to discharge  Elevated CPK CPK 247 This is possibly due to muscle breakdown when patient fell Continue gentle hydration  Transaminasemia AST 53, ALT 57, patient denies any abdominal pain Continue to monitor liver enzymes  DVT prophylaxis: Lovenox   Code Status: Full code  Family Communication: None at bedside  Consults: None  Severity of Illness: The appropriate patient status for this patient is INPATIENT. Inpatient status is judged to be reasonable and necessary in order to provide the required  intensity of service to ensure the patient's safety. The patient's presenting symptoms, physical exam findings, and initial radiographic and laboratory data in the context of their chronic comorbidities is felt to place them at high risk for further clinical deterioration. Furthermore, it is not anticipated that the patient will be medically stable for discharge from the hospital within 2 midnights of admission.   * I certify that at the point of admission it is my clinical judgment that the patient will  require inpatient hospital care spanning beyond 2 midnights from the point of admission due to high intensity of service, high risk for further deterioration and high frequency of surveillance required.*  Author: Aris Moman, DO 06/20/2024 3:53 AM  For on call review [email or web address].

## 2024-06-20 DIAGNOSIS — R7401 Elevation of levels of liver transaminase levels: Secondary | ICD-10-CM | POA: Insufficient documentation

## 2024-06-20 DIAGNOSIS — N3000 Acute cystitis without hematuria: Secondary | ICD-10-CM

## 2024-06-20 DIAGNOSIS — R748 Abnormal levels of other serum enzymes: Secondary | ICD-10-CM | POA: Insufficient documentation

## 2024-06-20 LAB — COMPREHENSIVE METABOLIC PANEL WITH GFR
ALT: 58 U/L — ABNORMAL HIGH (ref 0–44)
AST: 54 U/L — ABNORMAL HIGH (ref 15–41)
Albumin: 3.7 g/dL (ref 3.5–5.0)
Alkaline Phosphatase: 110 U/L (ref 38–126)
Anion gap: 11 (ref 5–15)
BUN: 10 mg/dL (ref 8–23)
CO2: 23 mmol/L (ref 22–32)
Calcium: 9.2 mg/dL (ref 8.9–10.3)
Chloride: 105 mmol/L (ref 98–111)
Creatinine, Ser: 0.75 mg/dL (ref 0.44–1.00)
GFR, Estimated: >60 mL/min (ref >60)
Glucose, Bld: 110 mg/dL — ABNORMAL HIGH (ref 70–99)
Potassium: 3.6 mmol/L (ref 3.5–5.1)
Sodium: 139 mmol/L (ref 135–145)
Total Bilirubin: 1 mg/dL (ref 0.0–1.2)
Total Protein: 6.4 g/dL — ABNORMAL LOW (ref 6.5–8.1)

## 2024-06-20 LAB — CBC
HCT: 43.1 % (ref 36.0–46.0)
Hemoglobin: 14.6 g/dL (ref 12.0–15.0)
MCH: 31.5 pg (ref 26.0–34.0)
MCHC: 33.9 g/dL (ref 30.0–36.0)
MCV: 93.1 fL (ref 80.0–100.0)
Platelets: 231 K/uL (ref 150–400)
RBC: 4.63 MIL/uL (ref 3.87–5.11)
RDW: 12.6 % (ref 11.5–15.5)
WBC: 8.4 K/uL (ref 4.0–10.5)
nRBC: 0 % (ref 0.0–0.2)

## 2024-06-20 LAB — PHOSPHORUS: Phosphorus: 2.7 mg/dL (ref 2.5–4.6)

## 2024-06-20 LAB — MAGNESIUM: Magnesium: 2.3 mg/dL (ref 1.7–2.4)

## 2024-06-20 MED ORDER — ENOXAPARIN SODIUM 40 MG/0.4ML IJ SOSY
40.0000 mg | PREFILLED_SYRINGE | INTRAMUSCULAR | Status: DC
  Administered 2024-06-20 – 2024-06-21 (×2): 40 mg via SUBCUTANEOUS
  Filled 2024-06-20 (×2): qty 0.4

## 2024-06-20 MED ORDER — ACETAMINOPHEN 325 MG PO TABS
650.0000 mg | ORAL_TABLET | Freq: Four times a day (QID) | ORAL | Status: DC | PRN
  Administered 2024-06-20: 650 mg via ORAL
  Filled 2024-06-20: qty 2

## 2024-06-20 MED ORDER — LACTATED RINGERS IV SOLN: INTRAVENOUS | Status: AC

## 2024-06-20 MED ORDER — ACETAMINOPHEN 650 MG RE SUPP: 650.0000 mg | Freq: Four times a day (QID) | RECTAL | Status: DC | PRN

## 2024-06-20 MED ORDER — ONDANSETRON HCL 4 MG PO TABS: 4.0000 mg | ORAL_TABLET | Freq: Four times a day (QID) | ORAL | Status: DC | PRN

## 2024-06-20 MED ORDER — SODIUM CHLORIDE 0.9 % IV SOLN
1.0000 g | INTRAVENOUS | Status: DC
  Administered 2024-06-20: 1 g via INTRAVENOUS
  Filled 2024-06-20: qty 10

## 2024-06-20 MED ORDER — LATANOPROST 0.005 % OP SOLN
1.0000 [drp] | Freq: Every day | OPHTHALMIC | Status: DC
  Administered 2024-06-20: 1 [drp] via OPHTHALMIC
  Filled 2024-06-20: qty 2.5

## 2024-06-20 MED ORDER — ONDANSETRON HCL 4 MG/2ML IJ SOLN: 4.0000 mg | Freq: Four times a day (QID) | INTRAMUSCULAR | Status: DC | PRN

## 2024-06-20 MED ORDER — INFLUENZA VAC SPLIT HIGH-DOSE 0.5 ML IM SUSY
0.5000 mL | PREFILLED_SYRINGE | INTRAMUSCULAR | Status: AC
  Administered 2024-06-21: 0.5 mL via INTRAMUSCULAR
  Filled 2024-06-20: qty 0.5

## 2024-06-20 NOTE — Progress Notes (Signed)
   06/20/24 1135  TOC Brief Assessment  Insurance and Status Reviewed  Patient has primary care physician Yes  Home environment has been reviewed Home alone  Prior level of function: Independent  Prior/Current Home Services No current home services  Social Drivers of Health Review SDOH reviewed no interventions necessary  Readmission risk has been reviewed Yes  Transition of care needs no transition of care needs at this time   Inpatient Care Manager (ICM) has reviewed patient and no ICM needs have been identified at this time. We will continue to monitor patient advancement through interdisciplinary progression rounds. If new patient transition needs arise, please place a ICM consult.

## 2024-06-20 NOTE — Progress Notes (Signed)
 PROGRESS NOTE   Brittany Mahoney  FMW:[id number] DOB: 04/09/1935 DOA: 06/19/2024 PCP: Brittany Suzzane POUR, MD   Chief Complaint  Patient presents with   Fall   Level of care: Med-Surg  Brief Admission History:  89 y.o. female with medical history significant of OA of knee, endometrial cancer s/p TAH, cognitive impairment (MoCA 21/30) who presented to the emergency department from home via EMS due to fall at home.  Patient was unable to provide history, history was obtained from EDP and ED medical record.  Per report, patient lives alone and sustained a fall, so she started to yell till a neighbor found on the ground in the kitchen.  Family member states that patient is similar episode before was noted to have UTI and she has been complaining of some UTI symptoms.  Patient does not know how she fell.   She was recently admitted here from 7/1 to 7/30 due to mechanical fall with gait instability, lumbar compression fracture and acute urinary tract infection which was treated with IV Rocephin  inpatient and transition to oral Keflex  for 4 more days to complete full course for 7 days.   ED Course:  In the emergency department, she was hemodynamically stable.  Workup in the ED showed normal CBC, BMP was normal except for blood glucose of 152, creatinine 1.01.  AST 53, ALT 57, ALP 121, total CK 247, alcohol level was less than 15, urinalysis was suggestive of UTI, ammonia 15.  Right knee x-ray showed moderate close arthritis with small joint effusion but no acute bony abnormality.  Chest x-ray showed no acute cardiopulmonary disease.  CT head and CT cervical spine showed no acute intracranial abnormality and multilevel degenerative change without acute abnormality.  She was treated with IV ceftriaxone , IV hydration was provided.   Assessment and Plan:  UTI POA Urinalysis was positive for UTI Continue IV ceftriaxone  Urine culture pending   Fall at home Continue fall precaution Continue PT/OT eval and  treat for evaluation Patient has had prior falls/she lives alone, consider placement prior to discharge   Elevated CPK CPK 247 This is possibly due to muscle breakdown when patient fell Continue gentle hydration   Transaminasemia AST 53, ALT 57, patient denies any abdominal pain Continue to monitor liver enzymes   DVT prophylaxis: enoxaparin  Code Status: DNR ([place] [employer]) Family Communication:  Disposition:    Consultants:   Procedures:   Antimicrobials:  Ceftriaxone  10/10>>  Subjective: Pt says she is tired today, denies burning with urination, denies chest pain and SOB, denies fever and chills.   Objective: Vitals:   06/20/24 0000 06/20/24 0039 06/20/24 0406 06/20/24 1303  BP: 123/67 130/62 (!) 128/52 116/68  Pulse: 90 95 91 88  Resp: (!) 21 16 17  (!) 21  Temp:  98.5 F (36.9 C) 99.5 F (37.5 C) 99 F (37.2 C)  TempSrc:  Oral Oral Oral  SpO2: 92% 97% 92% 94%  Weight:  61.3 kg    Height:        Intake/Output Summary (Last 24 hours) at 06/20/2024 1459 Last data filed at 06/20/2024 1221 Gross per 24 hour  Intake 1820 ml  Output --  Net 1820 ml   Filed Weights   06/20/24 0039  Weight: 61.3 kg   Examination:  General exam: Appears calm and comfortable  Respiratory system: Clear to auscultation. Respiratory effort normal. Cardiovascular system: normal S1 & S2 heard. No JVD, murmurs, rubs, gallops or clicks. No pedal edema. Gastrointestinal system: Abdomen is nondistended, soft  and nontender. No organomegaly or masses felt. Normal bowel sounds heard. Central nervous system: Alert and oriented. No focal neurological deficits. Extremities: Symmetric 5 x 5 power. Skin: No rashes, lesions or ulcers. Psychiatry: Judgement and insight appear normal. Mood & affect appropriate.   Data Reviewed: I have personally reviewed following labs and imaging studies  CBC: Recent Labs  Lab 06/19/24 2125 06/20/24 0336  WBC 10.2 8.4  NEUTROABS 9.2*  --   HGB 14.8  14.6  HCT 43.6 43.1  MCV 91.6 93.1  PLT 262 231    Basic Metabolic Panel: Recent Labs  Lab 06/19/24 2125 06/20/24 0336  NA 137 139  K 4.0 3.6  CL 99 105  CO2 26 23  GLUCOSE 152* 110*  BUN 12 10  CREATININE 1.01* 0.75  CALCIUM 9.8 9.2  MG  --  2.3  PHOS  --  2.7    CBG: No results for input(s): GLUCAP in the last 168 hours.  No results found for this or any previous visit (from the past 240 hours).   Radiology Studies: DG Chest 1 View Result Date: 06/19/2024 CLINICAL DATA:  Fall EXAM: CHEST  1 VIEW COMPARISON:  04/09/2016 FINDINGS: Heart and mediastinal contours are within normal limits. No focal opacities or effusions. No acute bony abnormality. No pneumothorax. Thoracolumbar scoliosis. IMPRESSION: No active cardiopulmonary disease. Electronically Signed   By: Franky Crease M.D.   On: 06/19/2024 22:12   DG Knee Complete 4 Views Right Result Date: 06/19/2024 CLINICAL DATA:  Fall, pain EXAM: RIGHT KNEE - COMPLETE 4+ VIEW COMPARISON:  None Available. FINDINGS: Hardware partially visualized in the distal femur. Moderate tricompartmental osteoarthritis with joint space narrowing and spurring. Small joint effusion. No acute bony abnormality. Specifically, no fracture, subluxation, or dislocation. IMPRESSION: Moderate osteoarthritis with small joint effusion. No acute bony abnormality. Electronically Signed   By: Franky Crease M.D.   On: 06/19/2024 22:11   DG Pelvis 1-2 Views Result Date: 06/19/2024 CLINICAL DATA:  Fall, pain EXAM: PELVIS - 1-2 VIEW COMPARISON:  03/10/2024 FINDINGS: Hardware noted in the right hip, unchanged. Hip joints and SI joints symmetric. No acute bony abnormality. Specifically, no fracture, subluxation, or dislocation. IMPRESSION: No acute bony abnormality. Electronically Signed   By: Franky Crease M.D.   On: 06/19/2024 22:10   CT Head Wo Contrast Result Date: 06/19/2024 CLINICAL DATA:  Un witnessed fall with headaches and neck pain, initial encounter EXAM:  CT HEAD WITHOUT CONTRAST CT CERVICAL SPINE WITHOUT CONTRAST TECHNIQUE: Multidetector CT imaging of the head and cervical spine was performed following the standard protocol without intravenous contrast. Multiplanar CT image reconstructions of the cervical spine were also generated. RADIATION DOSE REDUCTION: This exam was performed according to the departmental dose-optimization program which includes automated exposure control, adjustment of the mA and/or kV according to patient size and/or use of iterative reconstruction technique. COMPARISON:  03/10/2024 FINDINGS: CT HEAD FINDINGS Brain: No evidence of acute infarction, hemorrhage, hydrocephalus, extra-axial collection or mass lesion/mass effect. Scattered hypodense foci are noted within the left lateral ventricle as well as in the subarachnoid space and along the skull base near the cavernous sinus. These were not present on the prior exam. Vascular: No hyperdense vessel or unexpected calcification. Skull: Normal. Negative for fracture or focal lesion. Sinuses/Orbits: No acute finding. Other: None. CT CERVICAL SPINE FINDINGS Alignment: Loss of the normal cervical lordosis is noted. Skull base and vertebrae: 7 cervical segments are well visualized. Multilevel disc space narrowing and osteophytic changes are seen. Facet hypertrophic changes are  noted as well. Mild anterolisthesis of C3 on C4 and C4 on C5 is noted. The odontoid is within normal limits. No acute fracture or acute facet abnormality is noted. Soft tissues and spinal canal: Surrounding soft tissue structures are within normal limits. Upper chest: Visualized lung apices are unremarkable. Other: None IMPRESSION: CT of the head: No acute intracranial abnormality noted. Scattered hypodense foci are noted as described likely representing venous air from recent IV start. CT of the cervical spine: Multilevel degenerative change without acute abnormality. Electronically Signed   By: Oneil Devonshire M.D.   On:  06/19/2024 22:01   CT Cervical Spine Wo Contrast Result Date: 06/19/2024 CLINICAL DATA:  Un witnessed fall with headaches and neck pain, initial encounter EXAM: CT HEAD WITHOUT CONTRAST CT CERVICAL SPINE WITHOUT CONTRAST TECHNIQUE: Multidetector CT imaging of the head and cervical spine was performed following the standard protocol without intravenous contrast. Multiplanar CT image reconstructions of the cervical spine were also generated. RADIATION DOSE REDUCTION: This exam was performed according to the departmental dose-optimization program which includes automated exposure control, adjustment of the mA and/or kV according to patient size and/or use of iterative reconstruction technique. COMPARISON:  03/10/2024 FINDINGS: CT HEAD FINDINGS Brain: No evidence of acute infarction, hemorrhage, hydrocephalus, extra-axial collection or mass lesion/mass effect. Scattered hypodense foci are noted within the left lateral ventricle as well as in the subarachnoid space and along the skull base near the cavernous sinus. These were not present on the prior exam. Vascular: No hyperdense vessel or unexpected calcification. Skull: Normal. Negative for fracture or focal lesion. Sinuses/Orbits: No acute finding. Other: None. CT CERVICAL SPINE FINDINGS Alignment: Loss of the normal cervical lordosis is noted. Skull base and vertebrae: 7 cervical segments are well visualized. Multilevel disc space narrowing and osteophytic changes are seen. Facet hypertrophic changes are noted as well. Mild anterolisthesis of C3 on C4 and C4 on C5 is noted. The odontoid is within normal limits. No acute fracture or acute facet abnormality is noted. Soft tissues and spinal canal: Surrounding soft tissue structures are within normal limits. Upper chest: Visualized lung apices are unremarkable. Other: None IMPRESSION: CT of the head: No acute intracranial abnormality noted. Scattered hypodense foci are noted as described likely representing venous  air from recent IV start. CT of the cervical spine: Multilevel degenerative change without acute abnormality. Electronically Signed   By: Oneil Devonshire M.D.   On: 06/19/2024 22:01    Scheduled Meds:  enoxaparin  (LOVENOX ) injection  40 mg Subcutaneous Q24H   [START ON 06/21/2024] Influenza vac split trivalent PF  0.5 mL Intramuscular Tomorrow-1000   Continuous Infusions:  cefTRIAXone  (ROCEPHIN )  IV       LOS: 1 day   Time spent: 53 mins  Mulan Adan Vicci, MD How to contact the [facility] Attending or Consulting provider 7A - 7P or covering provider during after hours 7P -7A, for this patient?  Check the care team in [facility] and look for a) attending/consulting [facility] provider listed and b) the [facility] team listed Log into [email or web address] to find provider on call.  Locate the [facility] provider you are looking for under [facility] and page to a number that you can be directly reached. If you still have difficulty reaching the provider, please page the [facility] (Director on Call) for the Hospitalists listed on amion for assistance.  06/20/2024, 2:59 PM

## 2024-06-20 NOTE — ED Provider Notes (Signed)
 [place] EMERGENCY DEPARTMENT AT [facility] [place] Provider Note  CSN: [id number] Arrival date & time: 06/19/24 8061  Chief Complaint(s) Fall  HPI Brittany Mahoney is a 89 y.o. female history of dementia, lives alone presenting to the emergency department with fall.  Patient here with family member, reports yesterday she was at her baseline.  Today, patient fell, was yelling and someone found her on the ground in the kitchen.  She denies any complaints.  Patient does seem somewhat confused.  Family member reports that she had a similar episode and was found to have a UTI.  Patient had been complaining of some UTI symptoms.  She denies any pain.  She does not really know how she fell.  History slightly limited due to confusion/AMS   Past Medical History Past Medical History:  Diagnosis Date   Age-related osteoporosis without current pathological fracture    Arthritis    Arthropathy, unspecified    Broken hip (HCC)    Cancer (HCC)    uterus   Chronic kidney disease, stage 1    Closed right hip fracture (HCC) 04/09/2016   Endometrial cancer, grade I (HCC) 03/21/2015   Fracture of unspecified part of neck of right femur, sequela    Glaucoma    Headache(784.0)    migraines after periods   Hepatitis    Hx of cancer of endometrium 03/06/2013   Osteoporosis    Other abnormalities of gait and mobility    Other amnesia    Pain in left knee    Pain in right foot    Pain in right shoulder    Personal history of (healed) traumatic fracture    PMB (postmenopausal bleeding)    Repeated falls    Tremor, unspecified    Patient Active Problem List   Diagnosis Date Noted   UTI (urinary tract infection) 06/19/2024   B12 deficiency 03/19/2024   Lumbar compression fracture (HCC) 03/10/2024   Fall at home, initial encounter 03/10/2024   Hypokalemia 03/10/2024   Acute UTI 03/10/2024   Current mild episode of major depressive disorder 11/05/2022   Elevated serum creatinine 11/05/2022    Physical deconditioning 11/03/2021   OA (osteoarthritis) of knee 06/23/2021   Osteoporosis    Other abnormalities of gait and mobility    Dementia (HCC)    Personal history of (healed) traumatic fracture    Repeated falls    Tremor, unspecified    History of uterine cancer 04/01/2017   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 Authorizing Provider  acetaminophen  (TYLENOL ) 500 MG tablet Take 2 tablets (1,000 mg total) by mouth every 8 (eight) hours as needed for mild pain (pain score 1-3). 03/12/24   Rai, Ripudeep K, MD  Calcium Carb-Cholecalciferol (CALCIUM CARBONATE-VITAMIN D3 PO) Take 500 mg by mouth daily.    [provider]  cyanocobalamin  (VITAMIN B12) 1000 MCG tablet Take 1,000 mcg by mouth daily.    [provider]  latanoprost  (XALATAN ) 0.005 % ophthalmic solution Place 1 drop into both eyes daily. 03/27/24   Landy Barnie RAMAN, NP  LUMIGAN  0.01 % SOLN Place 1 drop into both eyes at bedtime. 03/27/24   Landy Barnie RAMAN, NP  Past Surgical History Past Surgical History:  Procedure Laterality Date   ABDOMINAL HYSTERECTOMY  2007   TAH and BSO   COLONOSCOPY  07/31/2006   Dr.Rourk   DILATION AND CURETTAGE OF UTERUS     HIP FRACTURE SURGERY     INTRAMEDULLARY (IM) NAIL INTERTROCHANTERIC Right 04/10/2016   Procedure: INTRAMEDULLARY (IM) NAIL RIGHT HIP FRACTURE;  Surgeon: Lonni CINDERELLA Poli, MD;  Location: [facility] OR;  Service: Orthopedics;  Laterality: Right;   Family History Family History  Problem Relation Age of Onset   Stroke Maternal Grandfather    Stroke Paternal Grandfather    Cancer Mother    Cancer Father        lymphatic sarcoma   Early death Sister    Early death Son    Diabetes Son    Heart disease Other    Cancer Other    Tuberculosis Other     Diabetes Other    Thyroid  disease Other    Stroke Other    Heart disease Maternal Aunt    Heart disease Maternal Uncle     Social History Social History   Tobacco Use   Smoking status: Never   Smokeless tobacco: Never  Vaping Use   Vaping status: Never Used  Substance Use Topics   Alcohol use: No   Drug use: No   Allergies Penicillins, Codeine, Erythromycin, and Morphine  and codeine  Review of Systems Review of Systems  All other systems reviewed and are negative.   Physical Exam Vital Signs  I have reviewed the triage vital signs BP 122/65   Pulse 93   Temp 98 F (36.7 C) (Oral)   Resp 20   Ht 5' 6 (1.676 m)   SpO2 91%   BMI 23.08 kg/m  Physical Exam Vitals and nursing note reviewed.  Constitutional:      General: She is not in acute distress.    Appearance: She is well-developed.  HENT:     Head: Normocephalic and atraumatic.     Mouth/Throat:     Mouth: Mucous membranes are moist.  Eyes:     Pupils: Pupils are equal, round, and reactive to light.  Cardiovascular:     Rate and Rhythm: Normal rate and regular rhythm.     Heart sounds: No murmur heard. Pulmonary:     Effort: Pulmonary effort is normal. No respiratory distress.     Breath sounds: Normal breath sounds.  Abdominal:     General: Abdomen is flat.     Palpations: Abdomen is soft.     Tenderness: There is no abdominal tenderness.  Musculoskeletal:        General: No tenderness.     Right lower leg: No edema.     Left lower leg: No edema.     Comments: No midline C, T, L-spine tenderness.  Upper extremities atraumatic.  Some tenderness around the right knee with chronic arthritic changes, ROM intact.  Skin:    General: Skin is warm and dry.  Neurological:     General: No focal deficit present.     Mental Status: She is alert. Mental status is at baseline.  Psychiatric:        Mood and Affect: Mood normal.        Behavior: Behavior normal.     ED Results and  Treatments Labs (all labs ordered are listed, but only abnormal results are displayed) Labs Reviewed  COMPREHENSIVE METABOLIC PANEL WITH GFR - Abnormal; Notable for the following components:  Result Value   Glucose, Bld 152 (*)    Creatinine, Ser 1.01 (*)    AST 53 (*)    ALT 57 (*)    GFR, Estimated 54 (*)    All other components within normal limits  CBC WITH DIFFERENTIAL/PLATELET - Abnormal; Notable for the following components:   Neutro Abs 9.2 (*)    Lymphs Abs 0.4 (*)    All other components within normal limits  URINALYSIS, W/ REFLEX TO CULTURE (INFECTION SUSPECTED) - Abnormal; Notable for the following components:   Color, Urine AMBER (*)    APPearance HAZY (*)    Hgb urine dipstick MODERATE (*)    Protein, ur 30 (*)    Bacteria, UA RARE (*)    All other components within normal limits  CK - Abnormal; Notable for the following components:   Total CK 247 (*)    All other components within normal limits  URINE CULTURE  ETHANOL  URINE DRUG SCREEN  TSH  AMMONIA                                                                                                                          Radiology DG Chest 1 View Result Date: 06/19/2024 CLINICAL DATA:  Fall EXAM: CHEST  1 VIEW COMPARISON:  04/09/2016 FINDINGS: Heart and mediastinal contours are within normal limits. No focal opacities or effusions. No acute bony abnormality. No pneumothorax. Thoracolumbar scoliosis. IMPRESSION: No active cardiopulmonary disease. Electronically Signed   By: Franky Crease M.D.   On: 06/19/2024 22:12   DG Knee Complete 4 Views Right Result Date: 06/19/2024 CLINICAL DATA:  Fall, pain EXAM: RIGHT KNEE - COMPLETE 4+ VIEW COMPARISON:  None Available. FINDINGS: Hardware partially visualized in the distal femur. Moderate tricompartmental osteoarthritis with joint space narrowing and spurring. Small joint effusion. No acute bony abnormality. Specifically, no fracture, subluxation, or dislocation.  IMPRESSION: Moderate osteoarthritis with small joint effusion. No acute bony abnormality. Electronically Signed   By: Franky Crease M.D.   On: 06/19/2024 22:11   DG Pelvis 1-2 Views Result Date: 06/19/2024 CLINICAL DATA:  Fall, pain EXAM: PELVIS - 1-2 VIEW COMPARISON:  03/10/2024 FINDINGS: Hardware noted in the right hip, unchanged. Hip joints and SI joints symmetric. No acute bony abnormality. Specifically, no fracture, subluxation, or dislocation. IMPRESSION: No acute bony abnormality. Electronically Signed   By: Franky Crease M.D.   On: 06/19/2024 22:10   CT Head Wo Contrast Result Date: 06/19/2024 CLINICAL DATA:  Un witnessed fall with headaches and neck pain, initial encounter EXAM: CT HEAD WITHOUT CONTRAST CT CERVICAL SPINE WITHOUT CONTRAST TECHNIQUE: Multidetector CT imaging of the head and cervical spine was performed following the standard protocol without intravenous contrast. Multiplanar CT image reconstructions of the cervical spine were also generated. RADIATION DOSE REDUCTION: This exam was performed according to the departmental dose-optimization program which includes automated exposure control, adjustment of the mA and/or kV according to patient size and/or use of iterative reconstruction technique. COMPARISON:  03/10/2024  FINDINGS: CT HEAD FINDINGS Brain: No evidence of acute infarction, hemorrhage, hydrocephalus, extra-axial collection or mass lesion/mass effect. Scattered hypodense foci are noted within the left lateral ventricle as well as in the subarachnoid space and along the skull base near the cavernous sinus. These were not present on the prior exam. Vascular: No hyperdense vessel or unexpected calcification. Skull: Normal. Negative for fracture or focal lesion. Sinuses/Orbits: No acute finding. Other: None. CT CERVICAL SPINE FINDINGS Alignment: Loss of the normal cervical lordosis is noted. Skull base and vertebrae: 7 cervical segments are well visualized. Multilevel disc space  narrowing and osteophytic changes are seen. Facet hypertrophic changes are noted as well. Mild anterolisthesis of C3 on C4 and C4 on C5 is noted. The odontoid is within normal limits. No acute fracture or acute facet abnormality is noted. Soft tissues and spinal canal: Surrounding soft tissue structures are within normal limits. Upper chest: Visualized lung apices are unremarkable. Other: None IMPRESSION: CT of the head: No acute intracranial abnormality noted. Scattered hypodense foci are noted as described likely representing venous air from recent IV start. CT of the cervical spine: Multilevel degenerative change without acute abnormality. Electronically Signed   By: Oneil Devonshire M.D.   On: 06/19/2024 22:01   CT Cervical Spine Wo Contrast Result Date: 06/19/2024 CLINICAL DATA:  Un witnessed fall with headaches and neck pain, initial encounter EXAM: CT HEAD WITHOUT CONTRAST CT CERVICAL SPINE WITHOUT CONTRAST TECHNIQUE: Multidetector CT imaging of the head and cervical spine was performed following the standard protocol without intravenous contrast. Multiplanar CT image reconstructions of the cervical spine were also generated. RADIATION DOSE REDUCTION: This exam was performed according to the departmental dose-optimization program which includes automated exposure control, adjustment of the mA and/or kV according to patient size and/or use of iterative reconstruction technique. COMPARISON:  03/10/2024 FINDINGS: CT HEAD FINDINGS Brain: No evidence of acute infarction, hemorrhage, hydrocephalus, extra-axial collection or mass lesion/mass effect. Scattered hypodense foci are noted within the left lateral ventricle as well as in the subarachnoid space and along the skull base near the cavernous sinus. These were not present on the prior exam. Vascular: No hyperdense vessel or unexpected calcification. Skull: Normal. Negative for fracture or focal lesion. Sinuses/Orbits: No acute finding. Other: None. CT CERVICAL  SPINE FINDINGS Alignment: Loss of the normal cervical lordosis is noted. Skull base and vertebrae: 7 cervical segments are well visualized. Multilevel disc space narrowing and osteophytic changes are seen. Facet hypertrophic changes are noted as well. Mild anterolisthesis of C3 on C4 and C4 on C5 is noted. The odontoid is within normal limits. No acute fracture or acute facet abnormality is noted. Soft tissues and spinal canal: Surrounding soft tissue structures are within normal limits. Upper chest: Visualized lung apices are unremarkable. Other: None IMPRESSION: CT of the head: No acute intracranial abnormality noted. Scattered hypodense foci are noted as described likely representing venous air from recent IV start. CT of the cervical spine: Multilevel degenerative change without acute abnormality. Electronically Signed   By: Oneil Devonshire M.D.   On: 06/19/2024 22:01    Pertinent labs & imaging results that were available during my care of the patient were reviewed by me and considered in my medical decision making (see MDM for details).  Medications Ordered in ED Medications  cefTRIAXone  (ROCEPHIN ) 1 g in sodium chloride  0.9 % 100 mL IVPB (has no administration in time range)  sodium chloride  0.9 % bolus 1,000 mL (0 mLs Intravenous Stopped 06/19/24 2312)  Procedures Procedures  (including critical care time)  Medical Decision Making / ED Course   MDM:  89 year old presenting to the emergency department with fall at home, confusion.  Patient overall well-appearing, vital signs reassuring.  Physical examination overall without sign of significant traumatic injury.  Some pain around the right knee but appears to have some chronic arthritic changes.  Patient did have a similar presentation with UTI.  Labs today show evidence of UTI.  Other workup for underlying  cause of confusion including CT head, metabolic panel, TSH, ammonia are reassuring for other cause of confusion.  No evidence of any traumatic injury from her fall.  She lives alone and does not think she will be able to do well at home given increased confusion.  Discussed with hospitalist Dr. Adefeso who is admitted patient.      Additional history obtained: -Additional history obtained from ems -External records from outside source obtained and reviewed including: Chart review including previous notes, labs, imaging, consultation notes including prior notes    Lab Tests: -I ordered, reviewed, and interpreted labs.   The pertinent results include:   Labs Reviewed  COMPREHENSIVE METABOLIC PANEL WITH GFR - Abnormal; Notable for the following components:      Result Value   Glucose, Bld 152 (*)    Creatinine, Ser 1.01 (*)    AST 53 (*)    ALT 57 (*)    GFR, Estimated 54 (*)    All other components within normal limits  CBC WITH DIFFERENTIAL/PLATELET - Abnormal; Notable for the following components:   Neutro Abs 9.2 (*)    Lymphs Abs 0.4 (*)    All other components within normal limits  URINALYSIS, W/ REFLEX TO CULTURE (INFECTION SUSPECTED) - Abnormal; Notable for the following components:   Color, Urine AMBER (*)    APPearance HAZY (*)    Hgb urine dipstick MODERATE (*)    Protein, ur 30 (*)    Bacteria, UA RARE (*)    All other components within normal limits  CK - Abnormal; Notable for the following components:   Total CK 247 (*)    All other components within normal limits  URINE CULTURE  ETHANOL  URINE DRUG SCREEN  TSH  AMMONIA    Notable for UTI  EKG   EKG Interpretation Date/Time:  Friday June 19 2024 21:30:42 EDT Ventricular Rate:  101 PR Interval:  155 QRS Duration:  90 QT Interval:  352 QTC Calculation: 457 R Axis:   51  Text Interpretation: Sinus tachycardia Low voltage, precordial leads Abnormal R-wave progression, early transition Confirmed by  Francesca Fallow ([id number]) on 06/19/2024 11:21:16 PM         Imaging Studies ordered: I ordered imaging studies including CXR, CT scans On my interpretation imaging demonstrates no acute process  I independently visualized and interpreted imaging. I agree with the radiologist interpretation   Medicines ordered and prescription drug management: Meds ordered this encounter  Medications   sodium chloride  0.9 % bolus 1,000 mL   cefTRIAXone  (ROCEPHIN ) 1 g in sodium chloride  0.9 % 100 mL IVPB    Antibiotic Indication::   UTI    -I have reviewed the patients home medicines and have made adjustments as needed  Reevaluation: After the interventions noted above, I reevaluated the patient and found that their symptoms have improved  Co morbidities that complicate the patient evaluation  Past Medical History:  Diagnosis Date   Age-related osteoporosis without current pathological fracture    Arthritis  Arthropathy, unspecified    Broken hip (HCC)    Cancer (HCC)    uterus   Chronic kidney disease, stage 1    Closed right hip fracture (HCC) 04/09/2016   Endometrial cancer, grade I (HCC) 03/21/2015   Fracture of unspecified part of neck of right femur, sequela    Glaucoma    Headache(784.0)    migraines after periods   Hepatitis    Hx of cancer of endometrium 03/06/2013   Osteoporosis    Other abnormalities of gait and mobility    Other amnesia    Pain in left knee    Pain in right foot    Pain in right shoulder    Personal history of (healed) traumatic fracture    PMB (postmenopausal bleeding)    Repeated falls    Tremor, unspecified       Dispostion: Disposition decision including need for hospitalization was considered, and patient admitted to the hospital.    Final Clinical Impression(s) / ED Diagnoses Final diagnoses:  Fall, initial encounter  Acute cystitis without hematuria  Acute metabolic encephalopathy     This chart was dictated using voice  recognition software.  Despite best efforts to proofread,  errors can occur which can change the documentation meaning.    Francesca Elsie CROME, MD 06/20/24 [phone]

## 2024-06-20 NOTE — Plan of Care (Signed)
   Problem: Education: Goal: Knowledge of General Education information will improve Description: Including pain rating scale, medication(s)/side effects and non-pharmacologic comfort measures Outcome: Not Progressing   Problem: Health Behavior/Discharge Planning: Goal: Ability to manage health-related needs will improve Outcome: Not Progressing   Problem: Clinical Measurements: Goal: Will remain free from infection Outcome: Not Progressing

## 2024-06-20 NOTE — Hospital Course (Addendum)
 89 y.o. female with medical history significant of OA of knee, endometrial cancer s/p TAH, cognitive impairment (MoCA 21/30) who presented to the emergency department from home via EMS due to fall at home.  Patient was unable to provide history, history was obtained from EDP and ED medical record.  Per report, patient lives alone and sustained a fall, so she started to yell till a neighbor found on the ground in the kitchen.  Family member states that patient is similar episode before was noted to have UTI and she has been complaining of some UTI symptoms.  Patient does not know how she fell.   She was recently admitted here from 7/1 to 7/30 due to mechanical fall with gait instability, lumbar compression fracture and acute urinary tract infection which was treated with IV Rocephin  inpatient and transition to oral Keflex  for 4 more days to complete full course for 7 days.   ED Course:  In the emergency department, she was hemodynamically stable.  Workup in the ED showed normal CBC, BMP was normal except for blood glucose of 152, creatinine 1.01.  AST 53, ALT 57, ALP 121, total CK 247, alcohol level was less than 15, urinalysis was suggestive of UTI, ammonia 15.  Right knee x-ray showed moderate close arthritis with small joint effusion but no acute bony abnormality.  Chest x-ray showed no acute cardiopulmonary disease.  CT head and CT cervical spine showed no acute intracranial abnormality and multilevel degenerative change without acute abnormality.  She was treated with IV ceftriaxone , IV hydration was provided.

## 2024-06-21 DIAGNOSIS — W19XXXA Unspecified fall, initial encounter: Secondary | ICD-10-CM | POA: Diagnosis not present

## 2024-06-21 DIAGNOSIS — N3000 Acute cystitis without hematuria: Secondary | ICD-10-CM | POA: Diagnosis not present

## 2024-06-21 DIAGNOSIS — R7401 Elevation of levels of liver transaminase levels: Secondary | ICD-10-CM | POA: Diagnosis not present

## 2024-06-21 DIAGNOSIS — R748 Abnormal levels of other serum enzymes: Secondary | ICD-10-CM | POA: Diagnosis not present

## 2024-06-21 LAB — CBC
HCT: 40.8 % (ref 36.0–46.0)
Hemoglobin: 13.8 g/dL (ref 12.0–15.0)
MCH: 30.9 pg (ref 26.0–34.0)
MCHC: 33.8 g/dL (ref 30.0–36.0)
MCV: 91.3 fL (ref 80.0–100.0)
Platelets: 202 K/uL (ref 150–400)
RBC: 4.47 MIL/uL (ref 3.87–5.11)
RDW: 12.5 % (ref 11.5–15.5)
WBC: 5.2 K/uL (ref 4.0–10.5)
nRBC: 0 % (ref 0.0–0.2)

## 2024-06-21 LAB — CK: Total CK: 223 U/L (ref 38–234)

## 2024-06-21 LAB — BASIC METABOLIC PANEL WITH GFR
Anion gap: 9 (ref 5–15)
BUN: 12 mg/dL (ref 8–23)
CO2: 25 mmol/L (ref 22–32)
Calcium: 8.9 mg/dL (ref 8.9–10.3)
Chloride: 103 mmol/L (ref 98–111)
Creatinine, Ser: 0.76 mg/dL (ref 0.44–1.00)
GFR, Estimated: >60 mL/min (ref >60)
Glucose, Bld: 88 mg/dL (ref 70–99)
Potassium: 3.8 mmol/L (ref 3.5–5.1)
Sodium: 137 mmol/L (ref 135–145)

## 2024-06-21 MED ORDER — FOSFOMYCIN TROMETHAMINE 3 G PO PACK
3.0000 g | PACK | Freq: Once | ORAL | Status: AC
  Administered 2024-06-21: 3 g via ORAL
  Filled 2024-06-21: qty 3

## 2024-06-21 NOTE — Discharge Instructions (Signed)
IMPORTANT INFORMATION: PAY CLOSE ATTENTION   PHYSICIAN DISCHARGE INSTRUCTIONS  Follow with Primary care provider  Patel, Rutwik K, MD  and other consultants as instructed by your Hospitalist Physician  SEEK MEDICAL CARE OR RETURN TO EMERGENCY ROOM IF SYMPTOMS COME BACK, WORSEN OR NEW PROBLEM DEVELOPS   Please note: You were cared for by a hospitalist during your hospital stay. Every effort will be made to forward records to your primary care provider.  You can request that your primary care provider send for your hospital records if they have not received them.  Once you are discharged, your primary care physician will handle any further medical issues. Please note that NO REFILLS for any discharge medications will be authorized once you are discharged, as it is imperative that you return to your primary care physician (or establish a relationship with a primary care physician if you do not have one) for your post hospital discharge needs so that they can reassess your need for medications and monitor your lab values.  Please get a complete blood count and chemistry panel checked by your Primary MD at your next visit, and again as instructed by your Primary MD.  Get Medicines reviewed and adjusted: Please take all your medications with you for your next visit with your Primary MD  Laboratory/radiological data: Please request your Primary MD to go over all hospital tests and procedure/radiological results at the follow up, please ask your primary care provider to get all Hospital records sent to his/her office.  In some cases, they will be blood work, cultures and biopsy results pending at the time of your discharge. Please request that your primary care provider follow up on these results.  If you are diabetic, please bring your blood sugar readings with you to your follow up appointment with primary care.    Please call and make your follow up appointments as soon as possible.    Also Note  the following: If you experience worsening of your admission symptoms, develop shortness of breath, life threatening emergency, suicidal or homicidal thoughts you must seek medical attention immediately by calling 911 or calling your MD immediately  if symptoms less severe.  You must read complete instructions/literature along with all the possible adverse reactions/side effects for all the Medicines you take and that have been prescribed to you. Take any new Medicines after you have completely understood and accpet all the possible adverse reactions/side effects.   Do not drive when taking Pain medications or sleeping medications (Benzodiazepines)  Do not take more than prescribed Pain, Sleep and Anxiety Medications. It is not advisable to combine anxiety,sleep and pain medications without talking with your primary care practitioner  Special Instructions: If you have smoked or chewed Tobacco  in the last 2 yrs please stop smoking, stop any regular Alcohol  and or any Recreational drug use.  Wear Seat belts while driving.  Do not drive if taking any narcotic, mind altering or controlled substances or recreational drugs or alcohol.       

## 2024-06-21 NOTE — Plan of Care (Signed)
  Problem: Acute Rehab PT Goals(only PT should resolve) Goal: Pt Will Go Supine/Side To Sit Outcome: Progressing Flowsheets (Taken 06/21/2024 0927) Pt will go Supine/Side to Sit: Independently Goal: Patient Will Transfer Sit To/From Stand Outcome: Progressing Flowsheets (Taken 06/21/2024 0927) Patient will transfer sit to/from stand: Independently Goal: Pt Will Transfer Bed To Chair/Chair To Bed Outcome: Progressing Flowsheets (Taken 06/21/2024 0927) Pt will Transfer Bed to Chair/Chair to Bed: with modified independence Goal: Pt Will Ambulate Outcome: Progressing Flowsheets (Taken 06/21/2024 0927) Pt will Ambulate:  100 feet  with modified independence  with supervision  Lang Ada, PT, DPT [facility] Office: [phone] 9:28 AM, 06/21/24

## 2024-06-21 NOTE — Plan of Care (Signed)
  Problem: Education: Goal: Knowledge of General Education information will improve Description: Including pain rating scale, medication(s)/side effects and non-pharmacologic comfort measures Outcome: Progressing   Problem: Clinical Measurements: Goal: Ability to maintain clinical measurements within normal limits will improve Outcome: Progressing   Problem: Activity: Goal: Risk for activity intolerance will decrease Outcome: Progressing   Problem: Coping: Goal: Level of anxiety will decrease Outcome: Progressing   Problem: Elimination: Goal: Will not experience complications related to urinary retention Outcome: Progressing   Problem: Safety: Goal: Ability to remain free from injury will improve Outcome: Progressing   Problem: Skin Integrity: Goal: Risk for impaired skin integrity will decrease Outcome: Progressing

## 2024-06-21 NOTE — Evaluation (Signed)
 Physical Therapy Evaluation Patient Details Name: Brittany Mahoney MRN: [id number] DOB: 1935/03/31 Today's Date: 06/21/2024  History of Present Illness  Brittany Mahoney is a 89 y.o. female with medical history significant of OA of knee, endometrial cancer s/p TAH, cognitive impairment (MoCA 21/30) who presents to the emergency department from home via EMS due to fall at home.  Patient was unable to provide history, history was obtained from EDP and ED medical record.  Per report, patient lives alone and sustained a fall, so she started to yell till a neighbor found on the ground in the kitchen.  Family member states that patient is similar episode before was noted to have UTI and she has been complaining of some UTI symptoms.  Patient does not know how she fell.   Clinical Impression  Patient demonstrates decreased LE strength, impaired functional mobility, and impaired balance. Patient also demonstrates difficulty with ambulation during today's session with decreased stride length and velocity noted. Patient does demonstrate ability to be modified independent with bed mobility, functional mobility and ambulation. Patient requires education on fall precaution, importance of lowering controllably for sitting, and PT recommendations. Patient would benefit from skilled physical therapy for increased endurance with ambulation, increased LE strength, and balance for improved gait quality, return to higher level of function with ADLs, and progress towards therapy goals.         If plan is discharge home, recommend the following: A little help with walking and/or transfers;A little help with bathing/dressing/bathroom;Assistance with cooking/housework;Assistance with feeding;Assist for transportation;Help with stairs or ramp for entrance   Can travel by private vehicle        Equipment Recommendations None recommended by PT  Recommendations for Other Services       Functional Status Assessment Patient  has had a recent decline in their functional status and demonstrates the ability to make significant improvements in function in a reasonable and predictable amount of time.     Precautions / Restrictions Precautions Precautions: Fall Recall of Precautions/Restrictions: Intact Restrictions Weight Bearing Restrictions Per Provider Order: No      Mobility  Bed Mobility Overal bed mobility: Modified Independent                  Transfers Overall transfer level: Modified independent                      Ambulation/Gait Ambulation/Gait assistance: Modified independent (Device/Increase time) Gait Distance (Feet): 20 Feet Assistive device: Rolling walker (2 wheels) Gait Pattern/deviations: Step-to pattern, Decreased stride length, Wide base of support Gait velocity: decreased     General Gait Details: RW and gait belt for safety  Stairs            Wheelchair Mobility     Tilt Bed    Modified Rankin (Stroke Patients Only)       Balance Overall balance assessment: Modified Independent                                           Pertinent Vitals/Pain Pain Assessment Pain Assessment: No/denies pain    Home Living Family/patient expects to be discharged to:: Private residence Living Arrangements: Alone Available Help at Discharge: Family;Friend(s);Available PRN/intermittently Type of Home: House Home Access: Level entry       Home Layout: One level Home Equipment: Agricultural consultant (2 wheels);Wheelchair - manual;Grab bars - tub/shower;Shower seat  Additional Comments: lives alone, has friends and neighbors that check on her    Prior Function Prior Level of Function : Independent/Modified Independent             Mobility Comments: spends a lot of time in the [facility] at home, can get around in it all around the house ADLs Comments: pts neighbors and friends cook and bring her groceries     Extremity/Trunk Assessment   Upper  Extremity Assessment Upper Extremity Assessment: Overall WFL for tasks assessed    Lower Extremity Assessment Lower Extremity Assessment: Generalized weakness    Cervical / Trunk Assessment Cervical / Trunk Assessment: Kyphotic  Communication   Communication Communication: No apparent difficulties    Cognition Arousal: Alert Behavior During Therapy: WFL for tasks assessed/performed   PT - Cognitive impairments: No apparent impairments                         Following commands: Intact       Cueing Cueing Techniques: Verbal cues, Visual cues     General Comments      Exercises     Assessment/Plan    PT Assessment Patient needs continued PT services  PT Problem List Decreased strength;Decreased range of motion;Decreased activity tolerance;Decreased balance;Decreased mobility       PT Treatment Interventions DME instruction;Stair training;Balance training;Functional mobility training;Neuromuscular re-education;Gait training;Therapeutic exercise;Therapeutic activities    PT Goals (Current goals can be found in the Care Plan section)  Acute Rehab PT Goals Patient Stated Goal: to return home PT Goal Formulation: With patient Time For Goal Achievement: 06/27/24 Potential to Achieve Goals: Good    Frequency Min 3X/week     Co-evaluation               AM-PAC PT 6 Clicks Mobility  Outcome Measure Help needed turning from your back to your side while in a flat bed without using bedrails?: A Little Help needed moving from lying on your back to sitting on the side of a flat bed without using bedrails?: A Little Help needed moving to and from a bed to a chair (including a wheelchair)?: A Little Help needed standing up from a chair using your arms (e.g., wheelchair or bedside chair)?: A Little Help needed to walk in hospital room?: A Little Help needed climbing 3-5 steps with a railing? : A Lot 6 Click Score: 17    End of Session Equipment Utilized  During Treatment: Gait belt Activity Tolerance: Patient tolerated treatment well Patient left: in chair;with chair alarm set;with call bell/phone within reach;with family/visitor present Nurse Communication: Mobility status;Precautions PT Visit Diagnosis: Unsteadiness on feet (R26.81);Other abnormalities of gait and mobility (R26.89);History of falling (Z91.81);Muscle weakness (generalized) (M62.81);Difficulty in walking, not elsewhere classified (R26.2)    Time: 9167-9086 PT Time Calculation (min) (ACUTE ONLY): 41 min   Charges:   PT Evaluation $PT Eval Low Complexity: 1 Low PT Treatments $Therapeutic Activity: 8-22 mins PT General Charges $$ ACUTE PT VISIT: 1 Visit         Lang Ada, PT, DPT [facility] Office: [phone] 9:27 AM, 06/21/24

## 2024-06-21 NOTE — Discharge Summary (Signed)
 Physician Discharge Summary  Brittany Mahoney FMW:[id number] DOB: [birth year] DOA: 06/19/2024  PCP: Brittany Suzzane POUR, MD  Admit date: 06/19/2024 Discharge date: 06/21/2024  Admitted From:  Home  Disposition: Home with HH   Recommendations for Outpatient Follow-up:  Follow up with PCP in 1 weeks Please consider ordering outpatient palliative care consultation   Home Health:  PT  Discharge Condition: STABLE   CODE STATUS: DNR DIET: regular    Brief Hospitalization Summary: Please see all hospital notes, images, labs for full details of the hospitalization. Admission provider HPI:  89 y.o. female with medical history significant of OA of knee, endometrial cancer s/p TAH, cognitive impairment (MoCA 21/30) who presented to the emergency department from home via EMS due to fall at home.  Patient was unable to provide history, history was obtained from EDP and ED medical record.  Per report, patient lives alone and sustained a fall, so she started to yell till a neighbor found on the ground in the kitchen.  Family member states that patient is similar episode before was noted to have UTI and she has been complaining of some UTI symptoms.  Patient does not know how she fell.   She was recently admitted here from 7/1 to 7/30 due to mechanical fall with gait instability, lumbar compression fracture and acute urinary tract infection which was treated with IV Rocephin  inpatient and transition to oral Keflex  for 4 more days to complete full course for 7 days.   ED Course:  In the emergency department, she was hemodynamically stable.  Workup in the ED showed normal CBC, BMP was normal except for blood glucose of 152, creatinine 1.01.  AST 53, ALT 57, ALP 121, total CK 247, alcohol level was less than 15, urinalysis was suggestive of UTI, ammonia 15.  Right knee x-ray showed moderate close arthritis with small joint effusion but no acute bony abnormality.  Chest x-ray showed no acute cardiopulmonary disease.   CT head and CT cervical spine showed no acute intracranial abnormality and multilevel degenerative change without acute abnormality.  She was treated with IV ceftriaxone , IV hydration was provided.  Hospital Course by listed problems   UTI POA Urinalysis suggesting UTI Pt was treated with IV ceftriaxone  Urine culture still pending Fosfomycin x 1 dose given prior to discharge home to complete therapy    Fall at home Continue fall precaution Continue PT/OT eval and treat for evaluation--recommendation for home health PT services Patient reports having good support system at home and accepted HHPT services   Elevated CPK---resolved  CPK 247-- now normalized after IV Fluid This is possibly due to muscle breakdown when patient fell Treated with gentle hydration   Transaminasemia AST 53, ALT 57, patient denies any abdominal pain Continue to monitor liver enzymes   Discharge Diagnoses:  Principal Problem:   UTI (urinary tract infection) Active Problems:   Fall at home, initial encounter   Transaminasemia   Elevated CPK   Discharge Instructions:  Allergies as of 06/21/2024       Reactions   Penicillins Hives, Swelling   Has patient had a PCN reaction causing immediate rash, facial/tongue/throat swelling, SOB or lightheadedness with hypotension: Yes Has patient had a PCN reaction causing severe rash involving mucus membranes or skin necrosis: No Has patient had a PCN reaction that required hospitalization No Has patient had a PCN reaction occurring within the last 10 years: No  If all of the above answers are NO, then may proceed with Cephalosporin use.   Codeine  Erythromycin    Morphine  And Codeine Other (See Comments)   loopy        Medication List     TAKE these medications    acetaminophen  500 MG tablet Commonly known as: TYLENOL  Take 2 tablets (1,000 mg total) by mouth every 8 (eight) hours as needed for mild pain (pain score 1-3).   latanoprost  0.005 %  ophthalmic solution Commonly known as: XALATAN  Place 1 drop into both eyes daily.   Lumigan  0.01 % Soln Generic drug: bimatoprost  Place 1 drop into both eyes at bedtime.        Follow-up Information     Brittany Suzzane POUR, MD. Schedule an appointment as soon as possible for a visit in 1 week(s).   Specialty: Internal Medicine Why: Hospital Follow Up Contact information: [address] [place] [address] [phone]                Allergies  Allergen Reactions   Penicillins Hives and Swelling    Has patient had a PCN reaction causing immediate rash, facial/tongue/throat swelling, SOB or lightheadedness with hypotension: Yes Has patient had a PCN reaction causing severe rash involving mucus membranes or skin necrosis: No Has patient had a PCN reaction that required hospitalization No Has patient had a PCN reaction occurring within the last 10 years: No  If all of the above answers are NO, then may proceed with Cephalosporin use.    Codeine    Erythromycin    Morphine  And Codeine Other (See Comments)    loopy   Allergies as of 06/21/2024       Reactions   Penicillins Hives, Swelling   Has patient had a PCN reaction causing immediate rash, facial/tongue/throat swelling, SOB or lightheadedness with hypotension: Yes Has patient had a PCN reaction causing severe rash involving mucus membranes or skin necrosis: No Has patient had a PCN reaction that required hospitalization No Has patient had a PCN reaction occurring within the last 10 years: No  If all of the above answers are NO, then may proceed with Cephalosporin use.   Codeine    Erythromycin    Morphine  And Codeine Other (See Comments)   loopy        Medication List     TAKE these medications    acetaminophen  500 MG tablet Commonly known as: TYLENOL  Take 2 tablets (1,000 mg total) by mouth every 8 (eight) hours as needed for mild pain (pain score 1-3).   latanoprost  0.005 % ophthalmic  solution Commonly known as: XALATAN  Place 1 drop into both eyes daily.   Lumigan  0.01 % Soln Generic drug: bimatoprost  Place 1 drop into both eyes at bedtime.        Procedures/Studies: DG Chest 1 View Result Date: 06/19/2024 CLINICAL DATA:  Fall EXAM: CHEST  1 VIEW COMPARISON:  04/09/2016 FINDINGS: Heart and mediastinal contours are within normal limits. No focal opacities or effusions. No acute bony abnormality. No pneumothorax. Thoracolumbar scoliosis. IMPRESSION: No active cardiopulmonary disease. Electronically Signed   By: Franky Crease M.D.   On: 06/19/2024 22:12   DG Knee Complete 4 Views Right Result Date: 06/19/2024 CLINICAL DATA:  Fall, pain EXAM: RIGHT KNEE - COMPLETE 4+ VIEW COMPARISON:  None Available. FINDINGS: Hardware partially visualized in the distal femur. Moderate tricompartmental osteoarthritis with joint space narrowing and spurring. Small joint effusion. No acute bony abnormality. Specifically, no fracture, subluxation, or dislocation. IMPRESSION: Moderate osteoarthritis with small joint effusion. No acute bony abnormality. Electronically Signed   By: Franky  Dover M.D.   On: 06/19/2024 22:11   DG Pelvis 1-2 Views Result Date: 06/19/2024 CLINICAL DATA:  Fall, pain EXAM: PELVIS - 1-2 VIEW COMPARISON:  03/10/2024 FINDINGS: Hardware noted in the right hip, unchanged. Hip joints and SI joints symmetric. No acute bony abnormality. Specifically, no fracture, subluxation, or dislocation. IMPRESSION: No acute bony abnormality. Electronically Signed   By: Franky Crease M.D.   On: 06/19/2024 22:10   CT Head Wo Contrast Result Date: 06/19/2024 CLINICAL DATA:  Un witnessed fall with headaches and neck pain, initial encounter EXAM: CT HEAD WITHOUT CONTRAST CT CERVICAL SPINE WITHOUT CONTRAST TECHNIQUE: Multidetector CT imaging of the head and cervical spine was performed following the standard protocol without intravenous contrast. Multiplanar CT image reconstructions of the  cervical spine were also generated. RADIATION DOSE REDUCTION: This exam was performed according to the departmental dose-optimization program which includes automated exposure control, adjustment of the mA and/or kV according to patient size and/or use of iterative reconstruction technique. COMPARISON:  03/10/2024 FINDINGS: CT HEAD FINDINGS Brain: No evidence of acute infarction, hemorrhage, hydrocephalus, extra-axial collection or mass lesion/mass effect. Scattered hypodense foci are noted within the left lateral ventricle as well as in the subarachnoid space and along the skull base near the cavernous sinus. These were not present on the prior exam. Vascular: No hyperdense vessel or unexpected calcification. Skull: Normal. Negative for fracture or focal lesion. Sinuses/Orbits: No acute finding. Other: None. CT CERVICAL SPINE FINDINGS Alignment: Loss of the normal cervical lordosis is noted. Skull base and vertebrae: 7 cervical segments are well visualized. Multilevel disc space narrowing and osteophytic changes are seen. Facet hypertrophic changes are noted as well. Mild anterolisthesis of C3 on C4 and C4 on C5 is noted. The odontoid is within normal limits. No acute fracture or acute facet abnormality is noted. Soft tissues and spinal canal: Surrounding soft tissue structures are within normal limits. Upper chest: Visualized lung apices are unremarkable. Other: None IMPRESSION: CT of the head: No acute intracranial abnormality noted. Scattered hypodense foci are noted as described likely representing venous air from recent IV start. CT of the cervical spine: Multilevel degenerative change without acute abnormality. Electronically Signed   By: Oneil Devonshire M.D.   On: 06/19/2024 22:01   CT Cervical Spine Wo Contrast Result Date: 06/19/2024 CLINICAL DATA:  Un witnessed fall with headaches and neck pain, initial encounter EXAM: CT HEAD WITHOUT CONTRAST CT CERVICAL SPINE WITHOUT CONTRAST TECHNIQUE: Multidetector  CT imaging of the head and cervical spine was performed following the standard protocol without intravenous contrast. Multiplanar CT image reconstructions of the cervical spine were also generated. RADIATION DOSE REDUCTION: This exam was performed according to the departmental dose-optimization program which includes automated exposure control, adjustment of the mA and/or kV according to patient size and/or use of iterative reconstruction technique. COMPARISON:  03/10/2024 FINDINGS: CT HEAD FINDINGS Brain: No evidence of acute infarction, hemorrhage, hydrocephalus, extra-axial collection or mass lesion/mass effect. Scattered hypodense foci are noted within the left lateral ventricle as well as in the subarachnoid space and along the skull base near the cavernous sinus. These were not present on the prior exam. Vascular: No hyperdense vessel or unexpected calcification. Skull: Normal. Negative for fracture or focal lesion. Sinuses/Orbits: No acute finding. Other: None. CT CERVICAL SPINE FINDINGS Alignment: Loss of the normal cervical lordosis is noted. Skull base and vertebrae: 7 cervical segments are well visualized. Multilevel disc space narrowing and osteophytic changes are seen. Facet hypertrophic changes are noted as well. Mild anterolisthesis of C3  on C4 and C4 on C5 is noted. The odontoid is within normal limits. No acute fracture or acute facet abnormality is noted. Soft tissues and spinal canal: Surrounding soft tissue structures are within normal limits. Upper chest: Visualized lung apices are unremarkable. Other: None IMPRESSION: CT of the head: No acute intracranial abnormality noted. Scattered hypodense foci are noted as described likely representing venous air from recent IV start. CT of the cervical spine: Multilevel degenerative change without acute abnormality. Electronically Signed   By: Oneil Devonshire M.D.   On: 06/19/2024 22:01     Subjective: Pt reports that she feels well and has good support  at home and feels she can manage well at home.  Says she has everything she needs at home right now.    Discharge Exam: Vitals:   06/20/24 2028 06/21/24 0404  BP: 102/63 137/69  Pulse: 69 83  Resp: 20 17  Temp: 98.9 F (37.2 C) 98.8 F (37.1 C)  SpO2: 96% 95%   Vitals:   06/20/24 0406 06/20/24 1303 06/20/24 2028 06/21/24 0404  BP: (!) 128/52 116/68 102/63 137/69  Pulse: 91 88 69 83  Resp: 17 (!) 21 20 17   Temp: 99.5 F (37.5 C) 99 F (37.2 C) 98.9 F (37.2 C) 98.8 F (37.1 C)  TempSrc: Oral Oral    SpO2: 92% 94% 96% 95%  Weight:      Height:       General: Pt is alert, awake, not in acute distress Cardiovascular: normal S1/S2 +, no rubs, no gallops Respiratory: CTA bilaterally, no wheezing, no rhonchi Abdominal: Soft, NT, ND, bowel sounds + Extremities: no edema, no cyanosis   The results of significant diagnostics from this hospitalization (including imaging, microbiology, ancillary and laboratory) are listed below for reference.     Microbiology: No results found for this or any previous visit (from the past 240 hours).   Labs: BNP (last 3 results) No results for input(s): BNP in the last 8760 hours. Basic Metabolic Panel: Recent Labs  Lab 06/19/24 2125 06/20/24 0336 06/21/24 0413  NA 137 139 137  K 4.0 3.6 3.8  CL 99 105 103  CO2 26 23 25   GLUCOSE 152* 110* 88  BUN 12 10 12   CREATININE 1.01* 0.75 0.76  CALCIUM 9.8 9.2 8.9  MG  --  2.3  --   PHOS  --  2.7  --    Liver Function Tests: Recent Labs  Lab 06/19/24 2125 06/20/24 0336  AST 53* 54*  ALT 57* 58*  ALKPHOS 121 110  BILITOT 1.1 1.0  PROT 7.0 6.4*  ALBUMIN 3.9 3.7   No results for input(s): LIPASE, AMYLASE in the last 168 hours. Recent Labs  Lab 06/19/24 2125  AMMONIA 13   CBC: Recent Labs  Lab 06/19/24 2125 06/20/24 0336 06/21/24 0413  WBC 10.2 8.4 5.2  NEUTROABS 9.2*  --   --   HGB 14.8 14.6 13.8  HCT 43.6 43.1 40.8  MCV 91.6 93.1 91.3  PLT 262 231 202   Cardiac  Enzymes: Recent Labs  Lab 06/19/24 2125 06/21/24 0413  CKTOTAL 247* 223   BNP: Invalid input(s): POCBNP CBG: No results for input(s): GLUCAP in the last 168 hours. D-Dimer No results for input(s): DDIMER in the last 72 hours. Hgb A1c No results for input(s): HGBA1C in the last 72 hours. Lipid Profile No results for input(s): CHOL, HDL, LDLCALC, TRIG, CHOLHDL, LDLDIRECT in the last 72 hours. Thyroid  function studies Recent Labs    06/19/24  2125  TSH 1.280   Anemia work up No results for input(s): VITAMINB12, FOLATE, FERRITIN, TIBC, IRON, RETICCTPCT in the last 72 hours. Urinalysis    Component Value Date/Time   COLORURINE AMBER (A) 06/19/2024 2305   APPEARANCEUR HAZY (A) 06/19/2024 2305   LABSPEC 1.014 06/19/2024 2305   PHURINE 6.0 06/19/2024 2305   GLUCOSEU NEGATIVE 06/19/2024 2305   HGBUR MODERATE (A) 06/19/2024 2305   BILIRUBINUR NEGATIVE 06/19/2024 2305   BILIRUBINUR negative 10/26/2019 1134   KETONESUR NEGATIVE 06/19/2024 2305   PROTEINUR 30 (A) 06/19/2024 2305   UROBILINOGEN 0.2 10/26/2019 1134   NITRITE NEGATIVE 06/19/2024 2305   LEUKOCYTESUR NEGATIVE 06/19/2024 2305   Sepsis Labs Recent Labs  Lab 06/19/24 2125 06/20/24 0336 06/21/24 0413  WBC 10.2 8.4 5.2   Microbiology No results found for this or any previous visit (from the past 240 hours).  Time coordinating discharge: 33 mins  SIGNED:  Afton Louder, MD  [facility] 06/21/2024, 10:21 AM How to contact the [facility] Attending or Consulting provider 7A - 7P or covering provider during after hours 7P -7A, for this patient?  Check the care team in [facility] and look for a) attending/consulting [facility] provider listed and b) the [facility] team listed Log into [email or web address] and use [place]'s universal password to access. If you do not have the password, please contact the hospital operator. Locate the [facility] provider you are looking for under [facility] and page to a  number that you can be directly reached. If you still have difficulty reaching the provider, please page the [facility] (Director on Call) for the Hospitalists listed on amion for assistance.

## 2024-06-21 NOTE — TOC Transition Note (Signed)
 Transition of Care [facility]) - Discharge Note   Patient Details  Name: Brittany Mahoney MRN: [id number] Date of Birth: 03-02-1935  Transition of Care [facility]) CM/SW Contact:  Lucie Lunger, LCSWA Phone Number: 06/21/2024, 12:24 PM  Clinical Narrative:    CSW updated that PT is recommending HH for pt at D/C. CSW met with pt at bedside to complete assessment. Pt states she lives alone and is agreeable to [facility] being arranged. Pt states she has a walker in the home. CSW received message from RN that pt first contact Jama Caldron is requesting call. CSW confirmed with pt that CSW could call. CSW spoke to Jama Caldron who states she is concerned about pt in the home alone. CSW explained that SNF is not recommended at this time and that [facility] can be arranged. CSW also reviewed private care giving services and provided resources. CSW notes that pt was active with [facility] in past, they are agreeable to this again. MD placed [facility] PT/OT/RN/Aide/SW orders. CSW spoke to [place] with [facility] who states they can accept [facility] referral at this time. TOC signing off.   Final next level of care: Home w Home Health Services Barriers to Discharge: Barriers Resolved   Patient Goals and CMS Choice Patient states their goals for this hospitalization and ongoing recovery are:: return home CMS Medicare.gov Compare Post Acute Care list provided to:: Patient Choice offered to / list presented to : Patient, [facility] POA / Guardian      Discharge Placement                       Discharge Plan and Services Additional resources added to the After Visit Summary for                            HH Arranged: RN, PT, OT, Nurse's Aide, Social Work [employer] Agency: [facility] Date [facility] Agency Contacted: 06/21/24   Representative spoke with at [facility] Agency: [facility]  Social Drivers of Health (SDOH) Interventions SDOH Screenings   Food Insecurity: No Food Insecurity (06/20/2024)  Housing: Low Risk  (06/20/2024)  Transportation  Needs: No Transportation Needs (06/20/2024)  Utilities: Not At Risk (06/20/2024)  Alcohol Screen: Low Risk  (10/23/2023)  Depression (PHQ2-9): High Risk (04/06/2024)  Financial Resource Strain: Low Risk  (10/23/2023)  Physical Activity: Inactive (10/23/2023)  Social Connections: Patient Unable To Answer (06/20/2024)  Stress: No Stress Concern Present (10/23/2023)  Tobacco Use: Low Risk  (06/19/2024)  Health Literacy: Adequate Health Literacy (10/23/2023)     Readmission Risk Interventions    03/12/2024   11:07 AM  Readmission Risk Prevention Plan  Post Dischage Appt Complete  Medication Screening Complete  Transportation Screening Complete

## 2024-06-22 ENCOUNTER — Telehealth: Payer: Self-pay

## 2024-06-22 NOTE — Transitions of Care (Post Inpatient/ED Visit) (Signed)
   06/22/2024  Name: Brittany Mahoney MRN: [id number] DOB: Feb 12, 1935  Today's TOC FU Call Status: Today's TOC FU Call Status:: Unsuccessful Call (1st Attempt) Unsuccessful Call (1st Attempt) Date: 06/22/24  Attempted to reach the patient regarding the most recent Inpatient/ED visit.  Follow Up Plan: Additional outreach attempts will be made to reach the patient to complete the Transitions of Care (Post Inpatient/ED visit) call.   Alan Ee, RN, BSN, CEN [employer]- Transition of Care Team.  [facility] [phone]

## 2024-06-23 DIAGNOSIS — G9341 Metabolic encephalopathy: Secondary | ICD-10-CM | POA: Diagnosis not present

## 2024-06-23 DIAGNOSIS — M1711 Unilateral primary osteoarthritis, right knee: Secondary | ICD-10-CM | POA: Diagnosis not present

## 2024-06-23 DIAGNOSIS — M25461 Effusion, right knee: Secondary | ICD-10-CM | POA: Diagnosis not present

## 2024-06-23 DIAGNOSIS — Z9181 History of falling: Secondary | ICD-10-CM | POA: Diagnosis not present

## 2024-06-23 DIAGNOSIS — M4302 Spondylolysis, cervical region: Secondary | ICD-10-CM | POA: Diagnosis not present

## 2024-06-23 DIAGNOSIS — F320 Major depressive disorder, single episode, mild: Secondary | ICD-10-CM | POA: Diagnosis not present

## 2024-06-23 DIAGNOSIS — N3000 Acute cystitis without hematuria: Secondary | ICD-10-CM | POA: Diagnosis not present

## 2024-06-23 DIAGNOSIS — E538 Deficiency of other specified B group vitamins: Secondary | ICD-10-CM | POA: Diagnosis not present

## 2024-06-23 DIAGNOSIS — R296 Repeated falls: Secondary | ICD-10-CM | POA: Diagnosis not present

## 2024-06-23 DIAGNOSIS — N904 Leukoplakia of vulva: Secondary | ICD-10-CM | POA: Diagnosis not present

## 2024-06-23 DIAGNOSIS — R7401 Elevation of levels of liver transaminase levels: Secondary | ICD-10-CM | POA: Diagnosis not present

## 2024-06-23 DIAGNOSIS — M4135 Thoracogenic scoliosis, thoracolumbar region: Secondary | ICD-10-CM | POA: Diagnosis not present

## 2024-06-23 DIAGNOSIS — F0393 Unspecified dementia, unspecified severity, with mood disturbance: Secondary | ICD-10-CM | POA: Diagnosis not present

## 2024-06-23 LAB — URINE CULTURE: Culture: >=100000 — AB

## 2024-06-24 ENCOUNTER — Telehealth: Payer: Self-pay

## 2024-06-24 NOTE — Transitions of Care (Post Inpatient/ED Visit) (Signed)
   06/24/2024  Name: Brittany Mahoney MRN: [id number] DOB: 01-06-1935  Today's TOC FU Call Status: Today's TOC FU Call Status:: Unsuccessful Call (2nd Attempt) Unsuccessful Call (2nd Attempt) Date: 06/24/24  Attempted to reach the patient regarding the most recent Inpatient/ED visit. Left a HIPAA approved voicemail message to phone number provided in demographics per DPR.    Follow Up Plan: Additional outreach attempts will be made to reach the patient to complete the Transitions of Care (Post Inpatient/ED visit) call.   Richerd Fish, RN, BSN, CCM [facility], [facility] RN Care Manager Direct Dial: [phone]

## 2024-06-25 ENCOUNTER — Telehealth: Payer: Self-pay

## 2024-06-25 DIAGNOSIS — F320 Major depressive disorder, single episode, mild: Secondary | ICD-10-CM | POA: Diagnosis not present

## 2024-06-25 DIAGNOSIS — M4135 Thoracogenic scoliosis, thoracolumbar region: Secondary | ICD-10-CM | POA: Diagnosis not present

## 2024-06-25 DIAGNOSIS — M25461 Effusion, right knee: Secondary | ICD-10-CM | POA: Diagnosis not present

## 2024-06-25 DIAGNOSIS — N3000 Acute cystitis without hematuria: Secondary | ICD-10-CM | POA: Diagnosis not present

## 2024-06-25 DIAGNOSIS — M1711 Unilateral primary osteoarthritis, right knee: Secondary | ICD-10-CM | POA: Diagnosis not present

## 2024-06-25 DIAGNOSIS — F0393 Unspecified dementia, unspecified severity, with mood disturbance: Secondary | ICD-10-CM | POA: Diagnosis not present

## 2024-06-25 NOTE — Transitions of Care (Post Inpatient/ED Visit) (Signed)
   06/25/2024  Name: EMILINE MANCEBO MRN: [id number] DOB: 1935/04/08  Today's TOC FU Call Status: Today's TOC FU Call Status:: Unsuccessful Call (3rd Attempt) Unsuccessful Call (3rd Attempt) Date: 06/25/24  Attempted to reach the patient regarding the most recent Inpatient/ED visit. Left a HIPAA approved voicemail message to phone number provided in demographics per DPR.    Follow Up Plan: No further outreach attempts will be made at this time. We have been unable to contact the patient.  Richerd Fish, RN, BSN, CCM [facility], [facility] RN Care Manager Direct Dial: [phone]

## 2024-07-01 DIAGNOSIS — M1711 Unilateral primary osteoarthritis, right knee: Secondary | ICD-10-CM | POA: Diagnosis not present

## 2024-07-01 DIAGNOSIS — F320 Major depressive disorder, single episode, mild: Secondary | ICD-10-CM | POA: Diagnosis not present

## 2024-07-01 DIAGNOSIS — N3000 Acute cystitis without hematuria: Secondary | ICD-10-CM | POA: Diagnosis not present

## 2024-07-01 DIAGNOSIS — F0393 Unspecified dementia, unspecified severity, with mood disturbance: Secondary | ICD-10-CM | POA: Diagnosis not present

## 2024-07-01 DIAGNOSIS — M25461 Effusion, right knee: Secondary | ICD-10-CM | POA: Diagnosis not present

## 2024-07-01 DIAGNOSIS — M4135 Thoracogenic scoliosis, thoracolumbar region: Secondary | ICD-10-CM | POA: Diagnosis not present

## 2024-07-07 DIAGNOSIS — N3000 Acute cystitis without hematuria: Secondary | ICD-10-CM | POA: Diagnosis not present

## 2024-07-07 DIAGNOSIS — M1711 Unilateral primary osteoarthritis, right knee: Secondary | ICD-10-CM | POA: Diagnosis not present

## 2024-07-07 DIAGNOSIS — F0393 Unspecified dementia, unspecified severity, with mood disturbance: Secondary | ICD-10-CM | POA: Diagnosis not present

## 2024-07-07 DIAGNOSIS — M25461 Effusion, right knee: Secondary | ICD-10-CM | POA: Diagnosis not present

## 2024-07-07 DIAGNOSIS — F320 Major depressive disorder, single episode, mild: Secondary | ICD-10-CM | POA: Diagnosis not present

## 2024-07-07 DIAGNOSIS — M4135 Thoracogenic scoliosis, thoracolumbar region: Secondary | ICD-10-CM | POA: Diagnosis not present

## 2024-07-08 ENCOUNTER — Ambulatory Visit: Admitting: Internal Medicine

## 2024-07-08 ENCOUNTER — Encounter: Payer: Self-pay | Admitting: Internal Medicine

## 2024-07-08 VITALS — BP 110/71 | HR 64 | Ht 66.0 in | Wt 137.6 lb

## 2024-07-08 DIAGNOSIS — S32050D Wedge compression fracture of fifth lumbar vertebra, subsequent encounter for fracture with routine healing: Secondary | ICD-10-CM | POA: Diagnosis not present

## 2024-07-08 DIAGNOSIS — R296 Repeated falls: Secondary | ICD-10-CM

## 2024-07-08 DIAGNOSIS — G309 Alzheimer's disease, unspecified: Secondary | ICD-10-CM | POA: Diagnosis not present

## 2024-07-08 DIAGNOSIS — F02B18 Dementia in other diseases classified elsewhere, moderate, with other behavioral disturbance: Secondary | ICD-10-CM | POA: Diagnosis not present

## 2024-07-08 DIAGNOSIS — M8000XD Age-related osteoporosis with current pathological fracture, unspecified site, subsequent encounter for fracture with routine healing: Secondary | ICD-10-CM

## 2024-07-08 DIAGNOSIS — N3000 Acute cystitis without hematuria: Secondary | ICD-10-CM | POA: Diagnosis not present

## 2024-07-08 NOTE — Assessment & Plan Note (Signed)
 Was recently treated with IV antibiotic and oral fosfomycin Currently denies dysuria or urinary frequency Advised to maintain adequate hydration for now

## 2024-07-08 NOTE — Assessment & Plan Note (Addendum)
 Has had multiple falls due to gait problem Had lumbar compression fracture in 07/25 and had another fall on 06/19/24 She is able to walk with rolling walker, at her baseline Uses walker at home Advised for proper hydration and to avoid skipping any meal Avoid sudden positional changes Recent hospitalization charts reviewed, including blood tests

## 2024-07-08 NOTE — Patient Instructions (Addendum)
 Please maintain at least 64 ounces of fluid intake in a day.  Please continue home PT for now.  Please check with [facility] for additional help at home. [address] [place], [place] [address] Phone: [phone]

## 2024-07-08 NOTE — Assessment & Plan Note (Addendum)
 Due to a mechanical fall in 07/25 Had PT at SNF Currently undergoing home PT

## 2024-07-08 NOTE — Progress Notes (Signed)
 Established Patient Office Visit  Subjective:  Patient ID: Brittany Mahoney, female    DOB: 03-Jan-1935  Age: 89 y.o. MRN: [id number]  CC:  Chief Complaint  Patient presents with   Medical Management of Chronic Issues    3 month f/u     HPI Brittany Mahoney is a 89 y.o. female with past medical history of osteoporosis, OA of knee, endometrial cancer s/p TAH, dementia and recurrent falls who presents for f/u of her chronic medical conditions. Her friend/healthcare power of attorney is present during the visit today.  Recurrent falls: She had a fall at home on 06/19/24, likely due to stumbling upon a step in her home.  She was on the floor for some time, after which her neighbor noticed when he came to drop some food for her.  In ER, she was found to have elevated CPK, AKI and UTI.  She was treated with IV fluids and antibiotics.  CT head and cervical spine was negative for any acute injury.  She had another fall in 07/25, which led to lumbar compression fracture, and is still undergoing home PT for it.  She currently uses rolling walker for support.  Denies any episode of chest pain or dyspnea.  She has intermittent dizziness, especially with sudden positional change.  Dementia: Her MoCA was 21/30 in the last visit.  She lost her daughter in 07/25 due to a stroke, who was her primary caregiver.  She feels down at times, but denies feeling depressed.  She feels lonely, and states that she tries to stay active with her cat.  She does play puzzles at times.  She sleeps about 4-5 hours at nighttime, but takes naps during the day. Denies any SI of HI currently.  She has history of severe episode of depression in the past.  Her neighbors currently help her with meals.  Her friend helps her with the finances.  I discussed about assisted living facility, but she prefers to stay at home for now.  She takes Caltrate for osteoporosis. She still does not want PT. Denies any recent fall since the last  visit.   Past Medical History:  Diagnosis Date   Age-related osteoporosis without current pathological fracture    Arthritis    Arthropathy, unspecified    Broken hip (HCC)    Cancer (HCC)    uterus   Chronic kidney disease, stage 1    Closed right hip fracture (HCC) 04/09/2016   Endometrial cancer, grade I (HCC) 03/21/2015   Fracture of unspecified part of neck of right femur, sequela    Glaucoma    Headache(784.0)    migraines after periods   Hepatitis    Hx of cancer of endometrium 03/06/2013   Osteoporosis    Other abnormalities of gait and mobility    Other amnesia    Pain in left knee    Pain in right foot    Pain in right shoulder    Personal history of (healed) traumatic fracture    PMB (postmenopausal bleeding)    Repeated falls    Tremor, unspecified     Past Surgical History:  Procedure Laterality Date   ABDOMINAL HYSTERECTOMY  2007   TAH and BSO   COLONOSCOPY  07/31/2006   Dr.Rourk   DILATION AND CURETTAGE OF UTERUS     HIP FRACTURE SURGERY     INTRAMEDULLARY (IM) NAIL INTERTROCHANTERIC Right 04/10/2016   Procedure: INTRAMEDULLARY (IM) NAIL RIGHT HIP FRACTURE;  Surgeon: Lonni CINDERELLA Poli, MD;  Location: [facility] OR;  Service: Orthopedics;  Laterality: Right;    Family History  Problem Relation Age of Onset   Stroke Maternal Grandfather    Stroke Paternal Grandfather    Cancer Mother    Cancer Father        lymphatic sarcoma   Early death Sister    Early death Son    Diabetes Son    Heart disease Other    Cancer Other    Tuberculosis Other    Diabetes Other    Thyroid  disease Other    Stroke Other    Heart disease Maternal Aunt    Heart disease Maternal Uncle     Social History   Socioeconomic History   Marital status: Widowed    Spouse name: Not on file   Number of children: Not on file   Years of education: Not on file   Highest education level: Not on file  Occupational History   Not on file  Tobacco Use   Smoking status: Never    Smokeless tobacco: Never  Vaping Use   Vaping status: Never Used  Substance and Sexual Activity   Alcohol use: No   Drug use: No   Sexual activity: Not Currently    Birth control/protection: Surgical  Other Topics Concern   Not on file  Social History Narrative   Widow since 2016,married for 55 years.Lives with daughter.Airline Pilot for [employer] ,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   Social Drivers of Corporate Investment Banker Strain: Low Risk  (10/23/2023)   Overall Financial Resource Strain (CARDIA)    Difficulty of Paying Living Expenses: Not hard at all  Food Insecurity: No Food Insecurity (06/20/2024)   Hunger Vital Sign    Worried About Running Out of Food in the Last Year: Never true    Ran Out of Food in the Last Year: Never true  Transportation Needs: No Transportation Needs (06/20/2024)   PRAPARE - Administrator, Civil Service (Medical): No    Lack of Transportation (Non-Medical): No  Physical Activity: Inactive (10/23/2023)   Exercise Vital Sign    Days of Exercise per Week: 0 days    Minutes of Exercise per Session: 0 min  Stress: No Stress Concern Present (10/23/2023)   [employer] of Occupational Health - Occupational Stress Questionnaire    Feeling of Stress : Not at all  Social Connections: Patient Unable To Answer (06/20/2024)   Social Connection and Isolation Panel    Frequency of Communication with Friends and Family: Patient unable to answer    Frequency of Social Gatherings with Friends and Family: Patient unable to answer    Attends Religious Services: Patient unable to answer    Active Member of Clubs or Organizations: Patient unable to answer    Attends Banker Meetings: Patient unable to answer    Marital Status: Patient unable to answer  Intimate Partner Violence: Not At Risk (06/20/2024)   Humiliation, Afraid, Rape, and Kick questionnaire    Fear of Current or Ex-Partner: No    Emotionally Abused: No     Physically Abused: No    Sexually Abused: No    Outpatient Medications Prior to Visit  Medication Sig Dispense Refill   acetaminophen  (TYLENOL ) 500 MG tablet Take 2 tablets (1,000 mg total) by mouth every 8 (eight) hours as needed for mild pain (pain score 1-3).     latanoprost  (XALATAN ) 0.005 % ophthalmic solution Place 1 drop into both eyes daily. 2.5 mL 0  LUMIGAN  0.01 % SOLN Place 1 drop into both eyes at bedtime. 2.5 mL 0   No facility-administered medications prior to visit.    Allergies  Allergen Reactions   Penicillins Hives and Swelling    Has patient had a PCN reaction causing immediate rash, facial/tongue/throat swelling, SOB or lightheadedness with hypotension: Yes Has patient had a PCN reaction causing severe rash involving mucus membranes or skin necrosis: No Has patient had a PCN reaction that required hospitalization No Has patient had a PCN reaction occurring within the last 10 years: No  If all of the above answers are NO, then may proceed with Cephalosporin use.    Codeine    Erythromycin    Morphine  And Codeine Other (See Comments)    loopy    ROS Review of Systems  Constitutional:  Negative for chills and fever.  HENT:  Negative for congestion, sinus pressure, sinus pain and sore throat.   Eyes:  Negative for pain and discharge.  Respiratory:  Negative for cough and shortness of breath.   Cardiovascular:  Negative for chest pain and palpitations.  Gastrointestinal:  Negative for abdominal pain, diarrhea, nausea and vomiting.  Endocrine: Negative for polydipsia and polyuria.  Genitourinary:  Negative for dysuria and hematuria.  Musculoskeletal:  Positive for arthralgias, back pain and gait problem. Negative for neck pain and neck stiffness.  Skin:  Negative for rash.  Neurological:  Positive for tremors. Negative for dizziness and weakness.  Psychiatric/Behavioral:  Positive for dysphoric mood and sleep disturbance. Negative for agitation and behavioral  problems.       Objective:    Physical Exam Vitals reviewed.  Constitutional:      General: She is not in acute distress.    Appearance: She is not diaphoretic.     Comments: In wheelchair  HENT:     Head: Normocephalic and atraumatic.     Nose: Nose normal.     Mouth/Throat:     Mouth: Mucous membranes are moist.  Eyes:     General: No scleral icterus.    Extraocular Movements: Extraocular movements intact.  Cardiovascular:     Rate and Rhythm: Normal rate and regular rhythm.     Pulses: Normal pulses.     Heart sounds: Normal heart sounds. No murmur heard. Pulmonary:     Breath sounds: Normal breath sounds. No wheezing or rales.  Musculoskeletal:     Cervical back: Neck supple. No tenderness.     Right lower leg: No edema.     Left lower leg: No edema.  Skin:    General: Skin is warm.     Comments: Livedo reticularis over b/l legs, chronic  Neurological:     General: No focal deficit present.     Mental Status: She is alert and oriented to person, place, and time. Mental status is at baseline.     Motor: Weakness (3/5 in b/l LE, 2/5 in b/l LE) present.  Psychiatric:        Mood and Affect: Mood normal.        Behavior: Behavior normal.     BP 110/71   Pulse 64   Ht 5' 6 (1.676 m)   Wt 137 lb 9.6 oz (62.4 kg)   SpO2 100%   BMI 22.21 kg/m  Wt Readings from Last 3 Encounters:  07/08/24 137 lb 9.6 oz (62.4 kg)  06/20/24 135 lb 2.3 oz (61.3 kg)  04/06/24 143 lb (64.9 kg)    Lab Results  Component Value Date  TSH 1.280 06/19/2024   Lab Results  Component Value Date   WBC 5.2 06/21/2024   HGB 13.8 06/21/2024   HCT 40.8 06/21/2024   MCV 91.3 06/21/2024   PLT 202 06/21/2024   Lab Results  Component Value Date   NA 137 06/21/2024   K 3.8 06/21/2024   CO2 25 06/21/2024   GLUCOSE 88 06/21/2024   BUN 12 06/21/2024   CREATININE 0.76 06/21/2024   BILITOT 1.0 06/20/2024   ALKPHOS 110 06/20/2024   AST 54 (H) 06/20/2024   ALT 58 (H) 06/20/2024   PROT  6.4 (L) 06/20/2024   ALBUMIN 3.7 06/20/2024   CALCIUM 8.9 06/21/2024   ANIONGAP 9 06/21/2024   EGFR 50 (L) 07/12/2023   Lab Results  Component Value Date   CHOL 125 03/09/2020   Lab Results  Component Value Date   HDL 40 (L) 03/09/2020   Lab Results  Component Value Date   LDLCALC 70 03/09/2020   Lab Results  Component Value Date   TRIG 74 03/09/2020   Lab Results  Component Value Date   CHOLHDL 3.1 03/09/2020   No results found for: HGBA1C    Assessment & Plan:   Problem List Items Addressed This Visit       Nervous and Auditory   Dementia (HCC) with other behavioral disturbance - Primary   Mostly short-term memory issues with mild agitation at times Independent with ADLs MoCA: 21/30 in the last visit Did not tolerate donepezil  Checked BMP, TSH, vitamin D  and B12 Lives alone, but her friend helps with her finances and neighbors bring meals for her - prefers to avoid assisted living facility for now Discussed with healthcare power of attorney - if she has recurrent fall or changes in mental status, will need to be placed in assisted living facility        Musculoskeletal and Integument   Osteoporosis   Last DEXA scan reviewed Was on Fosamax  in the past Currently on Caltrate plus vitamin D , prefers to avoid other treatment for now Had stopped HRT with estradiol  and progesterone  considering her age      Lumbar compression fracture (HCC)   Due to a mechanical fall in 07/25 Had PT at SNF Currently undergoing home PT        Genitourinary   UTI (urinary tract infection)   Was recently treated with IV antibiotic and oral fosfomycin Currently denies dysuria or urinary frequency Advised to maintain adequate hydration for now        Other   Repeated falls   Has had multiple falls due to gait problem Had lumbar compression fracture in 07/25 and had another fall on 06/19/24 She is able to walk with rolling walker, at her baseline Uses walker at  home Advised for proper hydration and to avoid skipping any meal Avoid sudden positional changes Recent hospitalization charts reviewed, including blood tests        No orders of the defined types were placed in this encounter.   Follow-up: Return in about 6 months (around 01/06/2025).    Suzzane MARLA Blanch, MD

## 2024-07-08 NOTE — Assessment & Plan Note (Addendum)
 Mostly short-term memory issues with mild agitation at times Independent with ADLs MoCA: 21/30 in the last visit Did not tolerate donepezil  Checked BMP, TSH, vitamin D  and B12 Lives alone, but her friend helps with her finances and neighbors bring meals for her - prefers to avoid assisted living facility for now Discussed with healthcare power of attorney - if she has recurrent fall or changes in mental status, will need to be placed in assisted living facility

## 2024-07-08 NOTE — Assessment & Plan Note (Signed)
 Last DEXA scan reviewed Was on Fosamax  in the past Currently on Caltrate plus vitamin D , prefers to avoid other treatment for now Had stopped HRT with estradiol  and progesterone  considering her age

## 2024-07-14 DIAGNOSIS — M1711 Unilateral primary osteoarthritis, right knee: Secondary | ICD-10-CM | POA: Diagnosis not present

## 2024-07-14 DIAGNOSIS — F320 Major depressive disorder, single episode, mild: Secondary | ICD-10-CM | POA: Diagnosis not present

## 2024-07-14 DIAGNOSIS — F0393 Unspecified dementia, unspecified severity, with mood disturbance: Secondary | ICD-10-CM | POA: Diagnosis not present

## 2024-07-14 DIAGNOSIS — N3000 Acute cystitis without hematuria: Secondary | ICD-10-CM | POA: Diagnosis not present

## 2024-07-14 DIAGNOSIS — M4135 Thoracogenic scoliosis, thoracolumbar region: Secondary | ICD-10-CM | POA: Diagnosis not present

## 2024-07-14 DIAGNOSIS — M25461 Effusion, right knee: Secondary | ICD-10-CM | POA: Diagnosis not present

## 2024-07-22 DIAGNOSIS — N3000 Acute cystitis without hematuria: Secondary | ICD-10-CM | POA: Diagnosis not present

## 2024-07-22 DIAGNOSIS — M4135 Thoracogenic scoliosis, thoracolumbar region: Secondary | ICD-10-CM | POA: Diagnosis not present

## 2024-07-22 DIAGNOSIS — F320 Major depressive disorder, single episode, mild: Secondary | ICD-10-CM | POA: Diagnosis not present

## 2024-07-22 DIAGNOSIS — F0393 Unspecified dementia, unspecified severity, with mood disturbance: Secondary | ICD-10-CM | POA: Diagnosis not present

## 2024-07-22 DIAGNOSIS — M1711 Unilateral primary osteoarthritis, right knee: Secondary | ICD-10-CM | POA: Diagnosis not present

## 2024-07-22 DIAGNOSIS — M25461 Effusion, right knee: Secondary | ICD-10-CM | POA: Diagnosis not present

## 2024-07-23 DIAGNOSIS — F320 Major depressive disorder, single episode, mild: Secondary | ICD-10-CM | POA: Diagnosis not present

## 2024-07-23 DIAGNOSIS — G9341 Metabolic encephalopathy: Secondary | ICD-10-CM | POA: Diagnosis not present

## 2024-07-23 DIAGNOSIS — E538 Deficiency of other specified B group vitamins: Secondary | ICD-10-CM | POA: Diagnosis not present

## 2024-07-23 DIAGNOSIS — Z9181 History of falling: Secondary | ICD-10-CM | POA: Diagnosis not present

## 2024-07-23 DIAGNOSIS — R7401 Elevation of levels of liver transaminase levels: Secondary | ICD-10-CM | POA: Diagnosis not present

## 2024-07-23 DIAGNOSIS — F0393 Unspecified dementia, unspecified severity, with mood disturbance: Secondary | ICD-10-CM | POA: Diagnosis not present

## 2024-07-23 DIAGNOSIS — M1711 Unilateral primary osteoarthritis, right knee: Secondary | ICD-10-CM | POA: Diagnosis not present

## 2024-07-23 DIAGNOSIS — M4302 Spondylolysis, cervical region: Secondary | ICD-10-CM | POA: Diagnosis not present

## 2024-07-23 DIAGNOSIS — M4135 Thoracogenic scoliosis, thoracolumbar region: Secondary | ICD-10-CM | POA: Diagnosis not present

## 2024-07-23 DIAGNOSIS — N904 Leukoplakia of vulva: Secondary | ICD-10-CM | POA: Diagnosis not present

## 2024-07-23 DIAGNOSIS — N3000 Acute cystitis without hematuria: Secondary | ICD-10-CM | POA: Diagnosis not present

## 2024-07-23 DIAGNOSIS — M25461 Effusion, right knee: Secondary | ICD-10-CM | POA: Diagnosis not present

## 2024-07-23 DIAGNOSIS — R296 Repeated falls: Secondary | ICD-10-CM | POA: Diagnosis not present

## 2024-07-30 DIAGNOSIS — F0393 Unspecified dementia, unspecified severity, with mood disturbance: Secondary | ICD-10-CM | POA: Diagnosis not present

## 2024-07-30 DIAGNOSIS — M1711 Unilateral primary osteoarthritis, right knee: Secondary | ICD-10-CM | POA: Diagnosis not present

## 2024-07-30 DIAGNOSIS — M25461 Effusion, right knee: Secondary | ICD-10-CM | POA: Diagnosis not present

## 2024-07-30 DIAGNOSIS — M4135 Thoracogenic scoliosis, thoracolumbar region: Secondary | ICD-10-CM | POA: Diagnosis not present

## 2024-07-30 DIAGNOSIS — F320 Major depressive disorder, single episode, mild: Secondary | ICD-10-CM | POA: Diagnosis not present

## 2024-07-30 DIAGNOSIS — N3000 Acute cystitis without hematuria: Secondary | ICD-10-CM | POA: Diagnosis not present

## 2024-08-04 DIAGNOSIS — M1711 Unilateral primary osteoarthritis, right knee: Secondary | ICD-10-CM | POA: Diagnosis not present

## 2024-08-04 DIAGNOSIS — F320 Major depressive disorder, single episode, mild: Secondary | ICD-10-CM | POA: Diagnosis not present

## 2024-08-04 DIAGNOSIS — M4135 Thoracogenic scoliosis, thoracolumbar region: Secondary | ICD-10-CM | POA: Diagnosis not present

## 2024-08-04 DIAGNOSIS — N3000 Acute cystitis without hematuria: Secondary | ICD-10-CM | POA: Diagnosis not present

## 2024-08-04 DIAGNOSIS — M25461 Effusion, right knee: Secondary | ICD-10-CM | POA: Diagnosis not present

## 2024-08-04 DIAGNOSIS — F0393 Unspecified dementia, unspecified severity, with mood disturbance: Secondary | ICD-10-CM | POA: Diagnosis not present

## 2024-08-11 ENCOUNTER — Ambulatory Visit: Payer: Self-pay

## 2024-08-11 DIAGNOSIS — M1711 Unilateral primary osteoarthritis, right knee: Secondary | ICD-10-CM | POA: Diagnosis not present

## 2024-08-11 DIAGNOSIS — M25461 Effusion, right knee: Secondary | ICD-10-CM | POA: Diagnosis not present

## 2024-08-11 DIAGNOSIS — F0393 Unspecified dementia, unspecified severity, with mood disturbance: Secondary | ICD-10-CM | POA: Diagnosis not present

## 2024-08-11 DIAGNOSIS — M4135 Thoracogenic scoliosis, thoracolumbar region: Secondary | ICD-10-CM | POA: Diagnosis not present

## 2024-08-11 DIAGNOSIS — F320 Major depressive disorder, single episode, mild: Secondary | ICD-10-CM | POA: Diagnosis not present

## 2024-08-11 DIAGNOSIS — N3000 Acute cystitis without hematuria: Secondary | ICD-10-CM | POA: Diagnosis not present

## 2024-08-11 NOTE — Telephone Encounter (Signed)
 FYI Only or Action Required?: FYI only for provider: appointment scheduled on 08/13/24.  Patient was last seen in primary care on 07/08/2024 by [name], MD.  Called Nurse Triage reporting Hip Pain.  Symptoms began today.  Interventions attempted: Prescription medications: naproxen.  Symptoms are: unchanged.  Triage Disposition: See HCP Within 4 Hours (Or PCP Triage)  Patient/caregiver understands and will follow disposition?: Yes  Copied from CRM #[id number]. Topic: Clinical - Red Word Triage >> Aug 11, 2024  1:12 PM Shardie S wrote: [facility] that prompted transfer to Nurse Triage: pain-hips Reason for Disposition  [1] SEVERE pain (e.g., excruciating, unable to do any normal activities) AND [2] not improved after 2 hours of pain medicine  Answer Assessment - Initial Assessment Questions Pt's caregiver, Tammy, called in stating pt is reporting 10/10 pain in the hips. Pt broke her tailbone 3 months ago but has been doing well in recovery, caregiver reports crepitus in the knees and that pt has arthritis. Pt has taken naproxen x1 at 6am today without relief. Pt is using a walker d/t pain. Pt reports not having a ride for an appt until 12/04. Discussed use of tylenol  and heat to relieve pain and discomfort but pt stated she has neither. Appointment scheduled for evaluation. Patient agrees with plan of care, and will call back if anything changes, or if symptoms worsen.     1. LOCATION and RADIATION: Where is the pain located? Does the pain spread (shoot) anywhere else?     Hips  2. QUALITY: What does the pain feel like?  (e.g., sharp, dull, aching, burning)     Feels pulling and grabbing   3. SEVERITY: How bad is the pain? What does it keep you from doing?   (Scale 1-10; or mild, moderate, severe)     0 at rest; 10 with walking   4. ONSET: When did the pain start? Does it come and go, or is it there all the time?     This morning  5. WORK OR EXERCISE: Has there  been any recent work or exercise that involved this part of the body?      None   6. CAUSE: What do you think is causing the hip pain?      Unknown   7. AGGRAVATING FACTORS: What makes the hip pain worse? (e.g., walking, climbing stairs, running)     Walking   8. OTHER SYMPTOMS: Do you have any other symptoms? (e.g., back pain, pain shooting down leg,  fever, rash)     None  Protocols used: Hip Pain-A-AH

## 2024-08-13 ENCOUNTER — Encounter: Payer: Self-pay | Admitting: Family Medicine

## 2024-08-13 ENCOUNTER — Ambulatory Visit: Payer: Self-pay | Admitting: Family Medicine

## 2024-08-13 VITALS — BP 144/80 | HR 94 | Temp 97.9°F | Ht 66.0 in | Wt 142.0 lb

## 2024-08-13 DIAGNOSIS — G8929 Other chronic pain: Secondary | ICD-10-CM | POA: Diagnosis not present

## 2024-08-13 DIAGNOSIS — M545 Low back pain, unspecified: Secondary | ICD-10-CM | POA: Insufficient documentation

## 2024-08-13 MED ORDER — NAPROXEN 500 MG PO TABS: 500.0000 mg | ORAL_TABLET | Freq: Two times a day (BID) | ORAL | 3 refills | Status: DC | PRN

## 2024-08-13 NOTE — Progress Notes (Signed)
 Subjective:  Patient ID: Brittany Mahoney, female    DOB: Jan 21, 1935  Age: 89 y.o. MRN: [id number]  CC:   Chief Complaint  Patient presents with   bilateral hip pain     Falls at 6 month and 2 months ago related to UTI's - PT done at [facility] and currently at home- arthritis takes naproxen otc bid     HPI:  89 year old female presents for evaluation of the above.  Patient reports ongoing hip pain.  She has had 2 falls in the past 6 months.  She is doing physical therapy.  She is taking some over-the-counter Aleve.  She is using her walker often not appropriately.  Patient Active Problem List   Diagnosis Date Noted   Low back pain 08/13/2024   Transaminasemia 06/20/2024   UTI (urinary tract infection) 06/19/2024   B12 deficiency 03/19/2024   Lumbar compression fracture (HCC) 03/10/2024   Current mild episode of major depressive disorder 11/05/2022   Physical deconditioning 11/03/2021   OA (osteoarthritis) of knee 06/23/2021   Osteoporosis    Other abnormalities of gait and mobility    Dementia (HCC) with other behavioral disturbance    Personal history of (healed) traumatic fracture    Repeated falls    Tremor, unspecified    History of uterine cancer 04/01/2017   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    Social Hx   Social History   Socioeconomic History   Marital status: Widowed    Spouse name: Not on file   Number of children: Not on file   Years of education: Not on file   Highest education level: Not on file  Occupational History   Not on file  Tobacco Use   Smoking status: Never   Smokeless tobacco: Never  Vaping Use   Vaping status: Never Used  Substance and Sexual Activity   Alcohol use: No   Drug use: No   Sexual activity: Not Currently    Birth control/protection: Surgical  Other Topics Concern   Not on file  Social History Narrative   Widow since 2016,married for 55 years.Lives with daughter.Airline Pilot for [employer] ,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    Social Drivers of Corporate Investment Banker Strain: Low Risk  (10/23/2023)   Overall Financial Resource Strain (CARDIA)    Difficulty of Paying Living Expenses: Not hard at all  Food Insecurity: No Food Insecurity (06/20/2024)   Hunger Vital Sign    Worried About Running Out of Food in the Last Year: Never true    Ran Out of Food in the Last Year: Never true  Transportation Needs: No Transportation Needs (06/20/2024)   PRAPARE - Administrator, Civil Service (Medical): No    Lack of Transportation (Non-Medical): No  Physical Activity: Inactive (10/23/2023)   Exercise Vital Sign    Days of Exercise per Week: 0 days    Minutes of Exercise per Session: 0 min  Stress: No Stress Concern Present (10/23/2023)   [employer] of Occupational Health - Occupational Stress Questionnaire    Feeling of Stress : Not at all  Social Connections: Patient Unable To Answer (06/20/2024)   Social Connection and Isolation Panel    Frequency of Communication with Friends and Family: Patient unable to answer    Frequency of Social Gatherings with Friends and Family: Patient unable to answer    Attends Religious Services: Patient unable to answer    Active Member of Clubs or Organizations: Patient unable to answer  Attends Banker Meetings: Patient unable to answer    Marital Status: Patient unable to answer    Review of Systems Per HPI  Objective:  BP (!) 144/80   Pulse 94   Temp 97.9 F (36.6 C)   Ht 5' 6 (1.676 m)   Wt 142 lb (64.4 kg)   SpO2 97%   BMI 22.92 kg/m      08/13/2024    9:22 AM 07/08/2024    9:50 AM 06/21/2024    2:30 PM  BP/Weight  Systolic BP 144 110 118  Diastolic BP 80 71 84  Wt. (Lbs) 142 137.6   BMI 22.92 kg/m2 22.21 kg/m2     Physical Exam Constitutional:      General: She is not in acute distress.    Appearance: Normal appearance.  HENT:     Head: Normocephalic and atraumatic.  Cardiovascular:     Rate and Rhythm: Normal  rate and regular rhythm.  Pulmonary:     Effort: Pulmonary effort is normal.     Breath sounds: Normal breath sounds.  Neurological:     Mental Status: She is alert.  Psychiatric:        Mood and Affect: Mood normal.        Behavior: Behavior normal.     Lab Results  Component Value Date   WBC 5.2 06/21/2024   HGB 13.8 06/21/2024   HCT 40.8 06/21/2024   PLT 202 06/21/2024   GLUCOSE 88 06/21/2024   CHOL 125 03/09/2020   TRIG 74 03/09/2020   HDL 40 (L) 03/09/2020   LDLCALC 70 03/09/2020   ALT 58 (H) 06/20/2024   AST 54 (H) 06/20/2024   NA 137 06/21/2024   K 3.8 06/21/2024   CL 103 06/21/2024   CREATININE 0.76 06/21/2024   BUN 12 06/21/2024   CO2 25 06/21/2024   TSH 1.280 06/19/2024   INR 1.00 04/09/2016     Assessment & Plan:  Chronic bilateral low back pain without sciatica Assessment & Plan: Patient has some hip osteoarthritis but I believe that her pain is mostly coming from the low back.  She has extensive degenerative changes in the low back.  Naproxen as directed.  Advised to use it sparingly if she can.  Follow-up closely with PCP.  Orders: -     Naproxen; Take 1 tablet (500 mg total) by mouth 2 (two) times daily as needed for moderate pain (pain score 4-6).  Dispense: 60 tablet; Refill: 3    Talayla Doyel DO [facility]

## 2024-08-13 NOTE — Patient Instructions (Signed)
 Medication as directed.  Use sparingly.  Take care  Dr. Bluford

## 2024-08-13 NOTE — Assessment & Plan Note (Signed)
 Patient has some hip osteoarthritis but I believe that her pain is mostly coming from the low back.  She has extensive degenerative changes in the low back.  Naproxen as directed.  Advised to use it sparingly if she can.  Follow-up closely with PCP.

## 2024-08-17 DIAGNOSIS — M1711 Unilateral primary osteoarthritis, right knee: Secondary | ICD-10-CM | POA: Diagnosis not present

## 2024-08-17 DIAGNOSIS — N3000 Acute cystitis without hematuria: Secondary | ICD-10-CM | POA: Diagnosis not present

## 2024-08-17 DIAGNOSIS — M25461 Effusion, right knee: Secondary | ICD-10-CM | POA: Diagnosis not present

## 2024-08-17 DIAGNOSIS — F320 Major depressive disorder, single episode, mild: Secondary | ICD-10-CM | POA: Diagnosis not present

## 2024-08-17 DIAGNOSIS — F0393 Unspecified dementia, unspecified severity, with mood disturbance: Secondary | ICD-10-CM | POA: Diagnosis not present

## 2024-08-17 DIAGNOSIS — M4135 Thoracogenic scoliosis, thoracolumbar region: Secondary | ICD-10-CM | POA: Diagnosis not present

## 2024-08-26 ENCOUNTER — Encounter: Payer: Self-pay | Admitting: Podiatry

## 2024-08-26 ENCOUNTER — Ambulatory Visit: Admitting: Podiatry

## 2024-08-26 DIAGNOSIS — M79674 Pain in right toe(s): Secondary | ICD-10-CM | POA: Diagnosis not present

## 2024-08-26 DIAGNOSIS — B351 Tinea unguium: Secondary | ICD-10-CM | POA: Diagnosis not present

## 2024-08-26 DIAGNOSIS — M79675 Pain in left toe(s): Secondary | ICD-10-CM | POA: Diagnosis not present

## 2024-08-26 NOTE — Progress Notes (Signed)
°  Subjective:  Patient ID: Brittany Mahoney, female    DOB: 15-Dec-1934,  MRN: [id number]  Brittany Mahoney presents to clinic today for painful elongated mycotic toenails 1-5 bilaterally which are tender when wearing enclosed shoe gear. Pain is relieved with periodic professional debridement. Sadly, patient lost her daughter in October.  Chief Complaint  Patient presents with   RFC    RFC not Diabetic.  Brittany Suzzane POUR, MD (PCP), 07/08/24    New problem(s): None.   PCP is Brittany Suzzane POUR, MD.  Allergies[1]  Review of Systems: Negative except as noted in the HPI.  Objective: No changes noted in today's physical examination. There were no vitals filed for this visit. Brittany Mahoney is a pleasant 89 y.o. female WD, WN in NAD. AAO x 3.  Vascular Examination: CFT <3 seconds b/l. DP/PT pulses faintly palpable b/l. Digital hair absent.  Skin temperature gradient warm to warm b/l. No pain with calf compression. No ischemia or gangrene. No cyanosis or clubbing noted b/l. No edema noted b/l LE.   Neurological Examination: Sensation grossly intact b/l with 10 gram monofilament. Vibratory sensation intact b/l.   Dermatological Examination: Pedal skin warm and supple b/l.   No open wounds. No interdigital macerations.  Toenails 1-5 b/l thick, discolored, elongated with subungual debris and pain on dorsal palpation.    Musculoskeletal Examination: Muscle strength 5/5 to all lower extremity muscle groups bilaterally. Pes planovalgus deformity noted b/l lower extremities right >left.  Radiographs: None  Assessment/Plan: 1. Pain due to onychomycosis of toenails of both feet   Extended condolences to patient on the loss of her daughter. Consent given for treatment. Patient examined. All patient's and/or POA's questions/concerns addressed on today's visit. Toenails 1-5 b/l debrided in length and girth without incident. Continue foot and shoe inspections daily. Monitor blood glucose per  PCP/Endocrinologist's recommendations. Continue soft, supportive shoe gear daily. Report any pedal injuries to medical professional. Call office if there are any questions/concerns. -Patient/POA to call should there be question/concern in the interim.   Return in about 3 months (around 11/24/2024).  Brittany Mahoney, DPM      [place] LOCATION: [address] [place] [address]                   Office [phone]   [place] LOCATION: [address] [place], [place] [address] Office [phone]      [1]  Allergies Allergen Reactions   Penicillins Hives and Swelling    Has patient had a PCN reaction causing immediate rash, facial/tongue/throat swelling, SOB or lightheadedness with hypotension: Yes Has patient had a PCN reaction causing severe rash involving mucus membranes or skin necrosis: No Has patient had a PCN reaction that required hospitalization No Has patient had a PCN reaction occurring within the last 10 years: No  If all of the above answers are NO, then may proceed with Cephalosporin use.    Codeine    Erythromycin    Morphine  And Codeine Other (See Comments)    loopy

## 2024-09-26 ENCOUNTER — Emergency Department (HOSPITAL_COMMUNITY)

## 2024-09-26 ENCOUNTER — Inpatient Hospital Stay (HOSPITAL_COMMUNITY)
Admission: EM | Admit: 2024-09-26 | Discharge: 2024-09-30 | DRG: 956 | Disposition: A | Discharge status: Skilled Nursing Facility | Attending: Internal Medicine | Admitting: Internal Medicine

## 2024-09-26 ENCOUNTER — Other Ambulatory Visit: Payer: Self-pay

## 2024-09-26 ENCOUNTER — Encounter (HOSPITAL_COMMUNITY): Payer: Self-pay | Admitting: Emergency Medicine

## 2024-09-26 DIAGNOSIS — Z8349 Family history of other endocrine, nutritional and metabolic diseases: Secondary | ICD-10-CM

## 2024-09-26 DIAGNOSIS — Z808 Family history of malignant neoplasm of other organs or systems: Secondary | ICD-10-CM

## 2024-09-26 DIAGNOSIS — Z66 Do not resuscitate: Secondary | ICD-10-CM | POA: Diagnosis present

## 2024-09-26 DIAGNOSIS — S7292XA Unspecified fracture of left femur, initial encounter for closed fracture: Secondary | ICD-10-CM | POA: Diagnosis not present

## 2024-09-26 DIAGNOSIS — H409 Unspecified glaucoma: Secondary | ICD-10-CM | POA: Diagnosis present

## 2024-09-26 DIAGNOSIS — W010XXA Fall on same level from slipping, tripping and stumbling without subsequent striking against object, initial encounter: Secondary | ICD-10-CM | POA: Diagnosis present

## 2024-09-26 DIAGNOSIS — G309 Alzheimer's disease, unspecified: Secondary | ICD-10-CM | POA: Diagnosis present

## 2024-09-26 DIAGNOSIS — F02A0 Dementia in other diseases classified elsewhere, mild, without behavioral disturbance, psychotic disturbance, mood disturbance, and anxiety: Secondary | ICD-10-CM | POA: Diagnosis present

## 2024-09-26 DIAGNOSIS — S064XAA Epidural hemorrhage with loss of consciousness status unknown, initial encounter: Secondary | ICD-10-CM | POA: Diagnosis present

## 2024-09-26 DIAGNOSIS — Z823 Family history of stroke: Secondary | ICD-10-CM

## 2024-09-26 DIAGNOSIS — Z809 Family history of malignant neoplasm, unspecified: Secondary | ICD-10-CM | POA: Diagnosis not present

## 2024-09-26 DIAGNOSIS — Z880 Allergy status to penicillin: Secondary | ICD-10-CM | POA: Diagnosis not present

## 2024-09-26 DIAGNOSIS — Z885 Allergy status to narcotic agent status: Secondary | ICD-10-CM | POA: Diagnosis not present

## 2024-09-26 DIAGNOSIS — N390 Urinary tract infection, site not specified: Secondary | ICD-10-CM | POA: Diagnosis present

## 2024-09-26 DIAGNOSIS — Y92009 Unspecified place in unspecified non-institutional (private) residence as the place of occurrence of the external cause: Secondary | ICD-10-CM

## 2024-09-26 DIAGNOSIS — M810 Age-related osteoporosis without current pathological fracture: Secondary | ICD-10-CM | POA: Diagnosis present

## 2024-09-26 DIAGNOSIS — Z833 Family history of diabetes mellitus: Secondary | ICD-10-CM

## 2024-09-26 DIAGNOSIS — Z8542 Personal history of malignant neoplasm of other parts of uterus: Secondary | ICD-10-CM | POA: Diagnosis not present

## 2024-09-26 DIAGNOSIS — F32A Depression, unspecified: Secondary | ICD-10-CM | POA: Diagnosis not present

## 2024-09-26 DIAGNOSIS — Z87440 Personal history of urinary (tract) infections: Secondary | ICD-10-CM | POA: Diagnosis not present

## 2024-09-26 DIAGNOSIS — S72002A Fracture of unspecified part of neck of left femur, initial encounter for closed fracture: Secondary | ICD-10-CM | POA: Diagnosis not present

## 2024-09-26 DIAGNOSIS — Z8249 Family history of ischemic heart disease and other diseases of the circulatory system: Secondary | ICD-10-CM | POA: Diagnosis not present

## 2024-09-26 DIAGNOSIS — S72142A Displaced intertrochanteric fracture of left femur, initial encounter for closed fracture: Principal | ICD-10-CM | POA: Diagnosis present

## 2024-09-26 DIAGNOSIS — Z831 Family history of other infectious and parasitic diseases: Secondary | ICD-10-CM

## 2024-09-26 DIAGNOSIS — Z881 Allergy status to other antibiotic agents status: Secondary | ICD-10-CM

## 2024-09-26 DIAGNOSIS — N181 Chronic kidney disease, stage 1: Secondary | ICD-10-CM | POA: Diagnosis present

## 2024-09-26 LAB — URINALYSIS, ROUTINE W REFLEX MICROSCOPIC
Bilirubin Urine: NEGATIVE
Glucose, UA: NEGATIVE mg/dL
Ketones, ur: NEGATIVE mg/dL
Nitrite: POSITIVE — AB
Protein, ur: NEGATIVE mg/dL
Specific Gravity, Urine: 1.017 (ref 1.005–1.030)
WBC, UA: >50 WBC/hpf (ref 0–5)
pH: 5 (ref 5.0–8.0)

## 2024-09-26 LAB — BASIC METABOLIC PANEL WITH GFR
Anion gap: 14 (ref 5–15)
BUN: 19 mg/dL (ref 8–23)
CO2: 21 mmol/L — ABNORMAL LOW (ref 22–32)
Calcium: 9.4 mg/dL (ref 8.9–10.3)
Chloride: 106 mmol/L (ref 98–111)
Creatinine, Ser: 1 mg/dL (ref 0.44–1.00)
GFR, Estimated: 54 mL/min — ABNORMAL LOW
Glucose, Bld: 136 mg/dL — ABNORMAL HIGH (ref 70–99)
Potassium: 3.8 mmol/L (ref 3.5–5.1)
Sodium: 141 mmol/L (ref 135–145)

## 2024-09-26 LAB — CBC WITH DIFFERENTIAL/PLATELET
Abs Immature Granulocytes: 0.08 K/uL — ABNORMAL HIGH (ref 0.00–0.07)
Basophils Absolute: 0.1 K/uL (ref 0.0–0.1)
Basophils Relative: 1 %
Eosinophils Absolute: 0.1 K/uL (ref 0.0–0.5)
Eosinophils Relative: 0 %
HCT: 39.6 % (ref 36.0–46.0)
Hemoglobin: 13 g/dL (ref 12.0–15.0)
Immature Granulocytes: 1 %
Lymphocytes Relative: 8 %
Lymphs Abs: 1.2 K/uL (ref 0.7–4.0)
MCH: 30.5 pg (ref 26.0–34.0)
MCHC: 32.8 g/dL (ref 30.0–36.0)
MCV: 93 fL (ref 80.0–100.0)
Monocytes Absolute: 0.9 K/uL (ref 0.1–1.0)
Monocytes Relative: 6 %
Neutro Abs: 13.4 K/uL — ABNORMAL HIGH (ref 1.7–7.7)
Neutrophils Relative %: 84 %
Platelets: 263 K/uL (ref 150–400)
RBC: 4.26 MIL/uL (ref 3.87–5.11)
RDW: 12.5 % (ref 11.5–15.5)
WBC: 15.7 K/uL — ABNORMAL HIGH (ref 4.0–10.5)
nRBC: 0 % (ref 0.0–0.2)

## 2024-09-26 LAB — PROTIME-INR
INR: 1 (ref 0.8–1.2)
Prothrombin Time: 14.2 s (ref 11.4–15.2)

## 2024-09-26 LAB — CK: Total CK: 89 U/L (ref 38–234)

## 2024-09-26 MED ORDER — HYDRALAZINE HCL 20 MG/ML IJ SOLN
10.0000 mg | INTRAMUSCULAR | Status: DC | PRN
  Administered 2024-09-26: 10 mg via INTRAVENOUS
  Filled 2024-09-26: qty 1

## 2024-09-26 MED ORDER — OXYCODONE HCL 5 MG PO TABS
5.0000 mg | ORAL_TABLET | ORAL | Status: DC | PRN
  Administered 2024-09-28 (×2): 5 mg via ORAL
  Administered 2024-09-29: 10 mg via ORAL
  Filled 2024-09-26 (×2): qty 2
  Filled 2024-09-26: qty 1

## 2024-09-26 MED ORDER — PROCHLORPERAZINE EDISYLATE 10 MG/2ML IJ SOLN: 5.0000 mg | Freq: Four times a day (QID) | INTRAMUSCULAR | Status: DC | PRN

## 2024-09-26 MED ORDER — POLYETHYLENE GLYCOL 3350 17 G PO PACK: 17.0000 g | PACK | Freq: Every day | ORAL | Status: DC | PRN

## 2024-09-26 MED ORDER — LACTATED RINGERS IV SOLN: INTRAVENOUS | Status: DC

## 2024-09-26 MED ORDER — ACETAMINOPHEN 325 MG PO TABS: 650.0000 mg | ORAL_TABLET | Freq: Four times a day (QID) | ORAL | Status: DC | PRN

## 2024-09-26 MED ORDER — SODIUM CHLORIDE 0.9 % IV SOLN
2.0000 g | INTRAVENOUS | Status: DC
  Administered 2024-09-27 – 2024-09-28 (×3): 2 g via INTRAVENOUS
  Filled 2024-09-26 (×3): qty 20

## 2024-09-26 MED ORDER — HYDROMORPHONE HCL 1 MG/ML IJ SOLN
0.5000 mg | INTRAMUSCULAR | Status: DC | PRN
  Administered 2024-09-27: 0.5 mg via INTRAVENOUS
  Filled 2024-09-26: qty 0.5

## 2024-09-26 NOTE — Care Plan (Signed)
 87F with L intertrochanteric hip fracture at [facility] ED. Plan for OR tomorrow, 1/18 at [facility] for internal fixation. Full consult to follow.  -Dale Hock MD

## 2024-09-26 NOTE — Progress Notes (Signed)
 89 y/o F who suffered a fall, found to have small right parietal SDH.   Will get a CT head tomorrow morning to ensure stability prior to undergoing orthopedic procedure SBP<140 Hold all anticoagulation and antiplatelets Ok for diet and activity from my standpoint

## 2024-09-26 NOTE — ED Notes (Addendum)
 Error in charting.

## 2024-09-26 NOTE — H&P (Incomplete)
 " History and Physical  Brittany Mahoney FMW:[id number] DOB: 12-13-1934 DOA: 09/26/2024  Referring physician: Lynwood Roosevelt, PA-EDP  PCP: [name], MD  Outpatient Specialists: Podiatry.- Patient coming from: Home.  Chief Complaint: Fall.  HPI: Brittany Mahoney is a 89 y.o. female with medical history significant for recurrent UTIs, mild dementia, chronic bilateral low back pain without sciatica, presents to the ED after mechanical fall at home.  The patient lives alone.  She answered a phone call at home, turned around, tripped, and fell.  Denies any chest pain.  She was in her usual state of health prior to this event.  EMS was activated.  Patient was brought into the ER for further evaluation.  Vital signs are stable.  Workup in the ER revealed left acute comminuted and displaced intertrochanteric fracture of the left femur with varus angulation.  UA positive for pyuria.  EDP discussed the case with orthopedic surgery who recommended admission by medicine team and to transfer to [facility] for planned surgical fixation in the OR.    Noncontrast head CT showing small lenticular shaped extra-axial hyperdense hemorrhage in the posterior right parietal region, possibly epidural or subdural measuring 5 mm in thickness without overlying fracture, mass effect, or midline shift.  EDP discussed the case with neurosurgery recommended conservative management with observation.  Repeat noncontrast head CT at 5 AM on 09/27/2024.   N.p.o. after midnight.  Admitted by [facility], hospitalist.  ED Course: Temperature 98.2.  BP 111/78, pulse 83 respiratory rate 18, O2 saturation 96% on room air.  Review of Systems: Review of systems as noted in the HPI. All other systems reviewed and are negative.   Past Medical History:  Diagnosis Date   Age-related osteoporosis without current pathological fracture    Arthritis    Arthropathy, unspecified    Broken hip (HCC)    Cancer (HCC)    uterus    Chronic kidney disease, stage 1    Closed right hip fracture (HCC) 04/09/2016   Endometrial cancer, grade I (HCC) 03/21/2015   Fracture of unspecified part of neck of right femur, sequela    Glaucoma    Headache(784.0)    migraines after periods   Hepatitis    Hx of cancer of endometrium 03/06/2013   Osteoporosis    Other abnormalities of gait and mobility    Other amnesia    Pain in left knee    Pain in right foot    Pain in right shoulder    Personal history of (healed) traumatic fracture    PMB (postmenopausal bleeding)    Repeated falls    Tremor, unspecified    Past Surgical History:  Procedure Laterality Date   ABDOMINAL HYSTERECTOMY  2007   TAH and BSO   COLONOSCOPY  07/31/2006   Dr.Rourk   DILATION AND CURETTAGE OF UTERUS     HIP FRACTURE SURGERY     INTRAMEDULLARY (IM) NAIL INTERTROCHANTERIC Right 04/10/2016   Procedure: INTRAMEDULLARY (IM) NAIL RIGHT HIP FRACTURE;  Surgeon: Lonni CINDERELLA Poli, MD;  Location: [facility] OR;  Service: Orthopedics;  Laterality: Right;    Social History:  reports that she has never smoked. She has never used smokeless tobacco. She reports that she does not drink alcohol and does not use drugs.   Allergies[1]  Family History  Problem Relation Age of Onset   Stroke Maternal Grandfather    Stroke Paternal Grandfather    Cancer Mother    Cancer Father  lymphatic sarcoma   Early death Sister    Early death Son    Diabetes Son    Heart disease Other    Cancer Other    Tuberculosis Other    Diabetes Other    Thyroid  disease Other    Stroke Other    Heart disease Maternal Aunt    Heart disease Maternal Uncle       Prior to Admission medications  Medication Sig Start Date End Date Taking? Authorizing Provider  acetaminophen  (TYLENOL ) 500 MG tablet Take 2 tablets (1,000 mg total) by mouth every 8 (eight) hours as needed for mild pain (pain score 1-3). 03/12/24   Rai, Ripudeep K, MD  latanoprost  (XALATAN ) 0.005 % ophthalmic solution  Place 1 drop into both eyes daily. 03/27/24   Landy Barnie RAMAN, NP  LUMIGAN  0.01 % SOLN Place 1 drop into both eyes at bedtime. 03/27/24   Landy Barnie RAMAN, NP  naproxen  (NAPROSYN ) 500 MG tablet Take 1 tablet (500 mg total) by mouth 2 (two) times daily as needed for moderate pain (pain score 4-6). 08/13/24   Cook, Jayce G, DO    Physical Exam: BP 135/67   Pulse 79   Temp 98.2 F (36.8 C) (Oral)   Resp 18   Ht 5' 6 (1.676 m)   Wt 64.4 kg   SpO2 98%   BMI 22.92 kg/m   General: 89 y.o. year-old female well developed well nourished in no acute distress.  Alert and verbal. Cardiovascular: Regular rate and rhythm with no rubs or gallops.  No thyromegaly or JVD noted.  No lower extremity edema. 2/4 pulses in all 4 extremities. Respiratory: Clear to auscultation with no wheezes or rales. Good inspiratory effort. Abdomen: Soft nontender nondistended with normal bowel sounds x4 quadrants. Muskuloskeletal: No cyanosis, clubbing or edema noted bilaterally.  Left lower extremity is externally rotated and shortened. Neuro: CN II-XII intact, strength, sensation, reflexes Skin: No ulcerative lesions noted or rashes Psychiatry: Mood is appropriate for condition and setting          Labs on Admission:  Basic Metabolic Panel: Recent Labs  Lab 09/26/24 1903  NA 141  K 3.8  CL 106  CO2 21*  GLUCOSE 136*  BUN 19  CREATININE 1.00  CALCIUM 9.4   Liver Function Tests: No results for input(s): AST, ALT, ALKPHOS, BILITOT, PROT, ALBUMIN in the last 168 hours. No results for input(s): LIPASE, AMYLASE in the last 168 hours. No results for input(s): AMMONIA in the last 168 hours. CBC: Recent Labs  Lab 09/26/24 1903  WBC 15.7*  NEUTROABS 13.4*  HGB 13.0  HCT 39.6  MCV 93.0  PLT 263   Cardiac Enzymes: Recent Labs  Lab 09/26/24 1903  CKTOTAL 89    BNP (last 3 results) No results for input(s): BNP in the last 8760 hours.  ProBNP (last 3 results) No results for  input(s): PROBNP in the last 8760 hours.  CBG: No results for input(s): GLUCAP in the last 168 hours.  Radiological Exams on Admission: DG Chest Portable 1 View Result Date: 09/26/2024 EXAM: 1 VIEW(S) XRAY OF THE CHEST 09/26/2024 08:25:54 PM COMPARISON: None available. CLINICAL HISTORY: fall fall fall fall FINDINGS: LUNGS AND PLEURA: No focal pulmonary opacity. No pleural effusion. No pneumothorax. HEART AND MEDIASTINUM: No acute abnormality of the cardiac and mediastinal silhouettes. BONES AND SOFT TISSUES: No acute osseous abnormality. IMPRESSION: 1. No acute process. Electronically signed by: Greig Pique MD 09/26/2024 08:26 PM EST RP Workstation: [id number]   DG Shoulder  Left Result Date: 09/26/2024 EXAM: 1 VIEW(S) XRAY OF THE LEFT SHOULDER 09/26/2024 06:37:08 PM COMPARISON: None available. CLINICAL HISTORY: fall fall fall FINDINGS: BONES AND JOINTS: Glenohumeral joint is normally aligned. No acute fracture. No malalignment. The [facility] joint is unremarkable. SOFT TISSUES: No abnormal calcifications. Visualized lung is unremarkable. IMPRESSION: 1. No evidence of acute injury. Electronically signed by: Greig Pique MD 09/26/2024 06:51 PM EST RP Workstation: [id number]   DG Hip Unilat W or Wo Pelvis 2-3 Views Left Result Date: 09/26/2024 EXAM: 2 or 3 VIEW(S) XRAY OF THE BILATERAL HIP 09/26/2024 06:37:08 PM COMPARISON: 03/10/2024 CLINICAL HISTORY: fall fall fall fall fall FINDINGS: BONES AND JOINTS: Acute comminuted and displaced intertrochanteric fracture of the left femur with varus angulation. Partially visualized intramedullary nail fixation of the right femur in place. Old posttraumatic and postsurgical changes of the right femur. LUMBAR SPINE: Degenerative changes of the lower lumbar spine. SOFT TISSUES: Unremarkable. IMPRESSION: 1. Acute comminuted and displaced intertrochanteric fracture of the left femur with varus angulation. 2. Old posttraumatic and postsurgical changes of the right femur  with partially visualized intramedullary nail fixation in place. Electronically signed by: Greig Pique MD 09/26/2024 06:51 PM EST RP Workstation: [id number]   CT Cervical Spine Wo Contrast Result Date: 09/26/2024 EXAM: CT CERVICAL SPINE WITHOUT CONTRAST 09/26/2024 06:16:37 PM TECHNIQUE: CT of the cervical spine was performed without the administration of intravenous contrast. Multiplanar reformatted images are provided for review. Automated exposure control, iterative reconstruction, and/or weight based adjustment of the mA/kV was utilized to reduce the radiation dose to as low as reasonably achievable. COMPARISON: 06/19/2024 CLINICAL HISTORY: Neck trauma (Age >= 65y) FINDINGS: BONES AND ALIGNMENT: No acute fracture. Slight anterolisthesis of C4 on C5 and retrolisthesis of C6 on C7. DEGENERATIVE CHANGES: Advanced degenerative disc disease from C4-C5 through C6-C7. Advanced bilateral degenerative facet disease diffusely. SOFT TISSUES: No prevertebral soft tissue swelling. IMPRESSION: 1. No evidence of acute traumatic injury. 2. Degenerative disc and facet disease as above. Electronically signed by: Franky Crease MD 09/26/2024 06:27 PM EST RP Workstation: [id number]   CT Head Wo Contrast Result Date: 09/26/2024 EXAM: CT HEAD WITHOUT CONTRAST 09/26/2024 06:16:37 PM TECHNIQUE: CT of the head was performed without the administration of intravenous contrast. Automated exposure control, iterative reconstruction, and/or weight based adjustment of the mA/kV was utilized to reduce the radiation dose to as low as reasonably achievable. COMPARISON: 06/19/2024 CLINICAL HISTORY: Head trauma, minor (Age >= 65y) FINDINGS: BRAIN AND VENTRICLES: Small lenticular-shaped extraaxial hyperdense hemorrhage noted in the posterior right parietal region measuring 5 mm in thickness. This could reflect a small epidural or subdural hemorrhage. No mass effect or midline shift. Atrophy and chronic small vessel disease throughout the deep  white matter. No evidence of acute infarct. No hydrocephalus. ORBITS: No acute abnormality. SINUSES: No acute abnormality. SOFT TISSUES AND SKULL: No acute soft tissue abnormality. No overlying fracture. IMPRESSION: 1. Small lenticular-shaped extraaxial hyperdense hemorrhage in the posterior right parietal region, possibly epidural or subdural, measuring 5 mm in thickness, without overlying fracture, mass effect, or midline shift. 2. These results were called to Dr. Birdena at the time of interpretation. Electronically signed by: Franky Crease MD 09/26/2024 06:25 PM EST RP Workstation: [id number]    EKG: I independently viewed the EKG done and my findings are as followed: None available at time of visit.  Assessment/Plan Present on Admission:  Closed left femoral fracture (HCC)  Principal Problem:   Closed left femoral fracture (HCC)  Closed left femoral neck fracture, post mechanical fall, POA Pain control  N.p.o. after midnight Plan for orthopedic surgical repair on 09/27/2024  Presumed urinary tract infection, POA Follow urine culture for ID and sensitivities Rocephin  empirically De-escalate antibiotics as able.  Alzheimer dementia Reorient as needed. Fall precautions  Time: 75 minutes.    DVT prophylaxis: SCDs.  Code Status: DNR/DNI.  Family Communication: The patient medical POA at bedside.  Disposition Plan: Admitted to telemetry unit.  Consults called: Orthopedic surgery.  Admission status: Inpatient status.   Status is: Inpatient The patient requires at least 2 midnights for further evaluation and treatment of present condition.   Terry LOISE Hurst MD [facility] Pager [phone]  If 7PM-7AM, please contact night-coverage [email or web address] Password [facility]  09/27/2024, 12:21 AM      [1]  Allergies Allergen Reactions   Penicillins Hives and Swelling    Has patient had a PCN reaction causing immediate rash, facial/tongue/throat swelling, SOB or  lightheadedness with hypotension: Yes Has patient had a PCN reaction causing severe rash involving mucus membranes or skin necrosis: No Has patient had a PCN reaction that required hospitalization No Has patient had a PCN reaction occurring within the last 10 years: No  If all of the above answers are NO, then may proceed with Cephalosporin use.    Codeine    Erythromycin    Morphine  And Codeine Other (See Comments)    loopy   "

## 2024-09-26 NOTE — ED Provider Notes (Signed)
 Signout received from Lori Idol, PA-C.  In short patient is a 89 year old female otherwise healthy who presents following ground-level fall.  Patient states that she just lost balance and fell on her left hip and hit her head.  Did not lose consciousness.  Was on the ground for 30 minutes before her sister found her.  Denied any preceding chest pain or shortness of breath.  Reportedly this is her third fall in the past 6 months.  Prior to times have been in the setting of a UTI with altered mental status.  Patient is without any urinary symptoms at this time and is alert and oriented.   Physical Exam  BP (!) 163/86   Pulse 80   Ht 5' 6 (1.676 m)   Wt 64.4 kg   SpO2 98%   BMI 22.92 kg/m   Physical Exam Vitals and nursing note reviewed.  Constitutional:      General: She is not in acute distress.    Appearance: She is well-developed.  HENT:     Head: Normocephalic and atraumatic.  Eyes:     Conjunctiva/sclera: Conjunctivae normal.  Cardiovascular:     Rate and Rhythm: Normal rate and regular rhythm.     Heart sounds: No murmur heard. Pulmonary:     Effort: Pulmonary effort is normal. No respiratory distress.     Breath sounds: Normal breath sounds.  Abdominal:     Palpations: Abdomen is soft.     Tenderness: There is no abdominal tenderness.  Musculoskeletal:        General: No swelling.     Cervical back: Neck supple.     Comments: Left lower extremity maintained and shortened and external rotation, tenderness to lateral hip without any gross deformities  Skin:    General: Skin is warm and dry.     Capillary Refill: Capillary refill takes less than 2 seconds.  Neurological:     Mental Status: She is alert.     Comments: Patient is alert and oriented. There is no abnormal phonation. Symmetric smile without facial droop.  Moves all extremities spontaneously. Wiggles toes in L lower extremity.  No sensation deficit. There is no nystagmus. EOMI, PERRL.   Psychiatric:        Mood  and Affect: Mood normal.     Procedures  Procedures  ED Course / MDM   Clinical Course as of 09/26/24 2041  Sat Sep 26, 2024  [id number] 89 year old female here for evaluation of injuries after a fall.  She did hit her head and has significant left hip pain.  CT showing possible intraparenchymal bleed.  X-ray of hip looks fractured.  Awaiting radiology reading and then will need discussed with consultants. [MB]    Clinical Course User Index [MB] Towana Ozell BROCKS, MD   Medical Decision Making Amount and/or Complexity of Data Reviewed Labs: ordered. Radiology: ordered.   Workup notable for acute comminuted and displaced intertrochanteric fracture  Discussed patient with Dr. Germaine with orthopedics.  Recommended admission to hospitalist service at [facility].  N.p.o. at midnight.  Plan for repair tomorrow.  Discussed patient with Dr. Darnella with neurosurgery, recommended observation time 6 hours.  No repeat imaging indicated.  Discussed patient with Dr. Shona.  Agreed for admission        Donnajean Lynwood VEAR DEVONNA 09/26/24 2041    Towana Ozell BROCKS, MD 09/27/24 [phone]

## 2024-09-26 NOTE — ED Provider Notes (Signed)
 " [facility] AT [facility] Provider Note   CSN: [id number] Arrival date & time: 09/26/24  1548     Patient presents with: [place]   Brittany Mahoney is a 89 y.o. female with a history including chronic kidney disease, osteoporosis history of right hip fracture, arthritis was at home when she lost her balance and fell landing on her left side.  Injury occurred prior to arrival today.  Her family member was and route to her home and had just spoken with her and when she arrived 30 minutes later she was found on the floor so she was not on the floor for great length of time.  Patient denies LOC, she does endorse severe left hip pain was unable to get off the floor secondary to this pain.  She also has discomfort in her left shoulder but she states it is improving and was able to rotate it without discomfort during her initial evaluation.  She is unsure if she hit her head but states if I did it was my right forehead.  She denies nausea vomiting, vision changes, chest pain, shortness of breath, dizziness or confusion.   The history is provided by the patient.  Fall Pertinent negatives include no chest pain, no abdominal pain, no headaches and no shortness of breath.       Prior to Admission medications  Medication Sig Start Date End Date Taking? Authorizing Provider  acetaminophen  (TYLENOL ) 500 MG tablet Take 2 tablets (1,000 mg total) by mouth every 8 (eight) hours as needed for mild pain (pain score 1-3). 03/12/24   Rai, Ripudeep K, MD  latanoprost  (XALATAN ) 0.005 % ophthalmic solution Place 1 drop into both eyes daily. 03/27/24   Landy Barnie RAMAN, NP  LUMIGAN  0.01 % SOLN Place 1 drop into both eyes at bedtime. 03/27/24   Landy Barnie RAMAN, NP  naproxen  (NAPROSYN ) 500 MG tablet Take 1 tablet (500 mg total) by mouth 2 (two) times daily as needed for moderate pain (pain score 4-6). 08/13/24   Cook, Jayce G, DO    Allergies: Penicillins, Codeine, Erythromycin, and Morphine   and codeine    Review of Systems  Constitutional:  Negative for fever.  HENT:  Negative for congestion and sore throat.   Eyes: Negative.   Respiratory:  Negative for chest tightness and shortness of breath.   Cardiovascular:  Negative for chest pain.  Gastrointestinal:  Negative for abdominal pain and nausea.  Genitourinary: Negative.   Musculoskeletal:  Positive for arthralgias. Negative for joint swelling and neck pain.  Skin: Negative.  Negative for rash and wound.  Neurological:  Negative for dizziness, weakness, light-headedness, numbness and headaches.  Psychiatric/Behavioral: Negative.      Updated Vital Signs BP (!) 163/86   Pulse 80   Ht 5' 6 (1.676 m)   Wt 64.4 kg   SpO2 98%   BMI 22.92 kg/m   Physical Exam Vitals and nursing note reviewed.  Constitutional:      Appearance: She is well-developed.  HENT:     Head: Normocephalic and atraumatic.  Eyes:     Conjunctiva/sclera: Conjunctivae normal.  Neck:     Comments: No midline C-spine tenderness. Cardiovascular:     Rate and Rhythm: Normal rate and regular rhythm.     Heart sounds: Normal heart sounds.  Pulmonary:     Effort: Pulmonary effort is normal.     Breath sounds: Normal breath sounds. No wheezing.  Abdominal:     General: Bowel sounds are  normal.     Palpations: Abdomen is soft.     Tenderness: There is no abdominal tenderness.  Musculoskeletal:     Left shoulder: Bony tenderness present. Normal range of motion.     Cervical back: Normal range of motion. No tenderness.     Comments: Mild tender to palpation left lateral humeral head, no palpable deformities, patient displays full range of motion of the left shoulder, there is no clavicle tenderness or deformity.  She has significant pain at her lateral left hip at the greater trochanter, the leg is held in external rotation and is shortened.  Pedal pulses are intact.  Skin:    General: Skin is warm and dry.  Neurological:     Mental Status: She  is alert.     (all labs ordered are listed, but only abnormal results are displayed) Labs Reviewed  CBC WITH DIFFERENTIAL/PLATELET - Abnormal; Notable for the following components:      Result Value   WBC 15.7 (*)    Neutro Abs 13.4 (*)    Abs Immature Granulocytes 0.08 (*)    All other components within normal limits  BASIC METABOLIC PANEL WITH GFR - Abnormal; Notable for the following components:   CO2 21 (*)    Glucose, Bld 136 (*)    GFR, Estimated 54 (*)    All other components within normal limits  CK  URINALYSIS, ROUTINE W REFLEX MICROSCOPIC    EKG: None  Radiology: DG Shoulder Left Result Date: 09/26/2024 EXAM: 1 VIEW(S) XRAY OF THE LEFT SHOULDER 09/26/2024 06:37:08 PM COMPARISON: None available. CLINICAL HISTORY: fall fall fall FINDINGS: BONES AND JOINTS: Glenohumeral joint is normally aligned. No acute fracture. No malalignment. The [facility] joint is unremarkable. SOFT TISSUES: No abnormal calcifications. Visualized lung is unremarkable. IMPRESSION: 1. No evidence of acute injury. Electronically signed by: Greig Pique MD 09/26/2024 06:51 PM EST RP Workstation: [id number]   DG Hip Unilat W or Wo Pelvis 2-3 Views Left Result Date: 09/26/2024 EXAM: 2 or 3 VIEW(S) XRAY OF THE BILATERAL HIP 09/26/2024 06:37:08 PM COMPARISON: 03/10/2024 CLINICAL HISTORY: fall fall fall fall fall FINDINGS: BONES AND JOINTS: Acute comminuted and displaced intertrochanteric fracture of the left femur with varus angulation. Partially visualized intramedullary nail fixation of the right femur in place. Old posttraumatic and postsurgical changes of the right femur. LUMBAR SPINE: Degenerative changes of the lower lumbar spine. SOFT TISSUES: Unremarkable. IMPRESSION: 1. Acute comminuted and displaced intertrochanteric fracture of the left femur with varus angulation. 2. Old posttraumatic and postsurgical changes of the right femur with partially visualized intramedullary nail fixation in place. Electronically  signed by: Greig Pique MD 09/26/2024 06:51 PM EST RP Workstation: [id number]   CT Cervical Spine Wo Contrast Result Date: 09/26/2024 EXAM: CT CERVICAL SPINE WITHOUT CONTRAST 09/26/2024 06:16:37 PM TECHNIQUE: CT of the cervical spine was performed without the administration of intravenous contrast. Multiplanar reformatted images are provided for review. Automated exposure control, iterative reconstruction, and/or weight based adjustment of the mA/kV was utilized to reduce the radiation dose to as low as reasonably achievable. COMPARISON: 06/19/2024 CLINICAL HISTORY: Neck trauma (Age >= 65y) FINDINGS: BONES AND ALIGNMENT: No acute fracture. Slight anterolisthesis of C4 on C5 and retrolisthesis of C6 on C7. DEGENERATIVE CHANGES: Advanced degenerative disc disease from C4-C5 through C6-C7. Advanced bilateral degenerative facet disease diffusely. SOFT TISSUES: No prevertebral soft tissue swelling. IMPRESSION: 1. No evidence of acute traumatic injury. 2. Degenerative disc and facet disease as above. Electronically signed by: Franky Crease MD 09/26/2024 06:27 PM EST RP  Workstation: [id number]   CT Head Wo Contrast Result Date: 09/26/2024 EXAM: CT HEAD WITHOUT CONTRAST 09/26/2024 06:16:37 PM TECHNIQUE: CT of the head was performed without the administration of intravenous contrast. Automated exposure control, iterative reconstruction, and/or weight based adjustment of the mA/kV was utilized to reduce the radiation dose to as low as reasonably achievable. COMPARISON: 06/19/2024 CLINICAL HISTORY: Head trauma, minor (Age >= 65y) FINDINGS: BRAIN AND VENTRICLES: Small lenticular-shaped extraaxial hyperdense hemorrhage noted in the posterior right parietal region measuring 5 mm in thickness. This could reflect a small epidural or subdural hemorrhage. No mass effect or midline shift. Atrophy and chronic small vessel disease throughout the deep white matter. No evidence of acute infarct. No hydrocephalus. ORBITS: No acute  abnormality. SINUSES: No acute abnormality. SOFT TISSUES AND SKULL: No acute soft tissue abnormality. No overlying fracture. IMPRESSION: 1. Small lenticular-shaped extraaxial hyperdense hemorrhage in the posterior right parietal region, possibly epidural or subdural, measuring 5 mm in thickness, without overlying fracture, mass effect, or midline shift. 2. These results were called to Dr. Birdena at the time of interpretation. Electronically signed by: Franky Crease MD 09/26/2024 06:25 PM EST RP Workstation: [id number]     Procedures   Medications Ordered in the ED - No data to display  Clinical Course as of 09/26/24 2005  Sat Sep 26, 2024  1352 89 year old female here for evaluation of injuries after a fall.  She did hit her head and has significant left hip pain.  CT showing possible intraparenchymal bleed.  X-ray of hip looks fractured.  Awaiting radiology reading and then will need discussed with consultants. [MB]    Clinical Course User Index [MB] Towana Ozell BROCKS, MD                                 Medical Decision Making Patient presenting for evaluation of a fall which occurred prior to arrival.  Her exam is concerning for a probable left hip fracture.  Plus or minus head injury, CT and plain film imaging has been ordered, results below.  She does have a left hip fracture, she also has a small right posterior epidural bleed.  Of note, patient is not on any blood thinning medications.  Pending lab results which were ordered given probable need for admission, discussed with Katrinka Roosevelt, PA-C who assumes care.  He will talk to neurosurgery and orthopedics regarding patient's injuries and get pt admitted.  Amount and/or Complexity of Data Reviewed Labs: ordered. Radiology: ordered.        Final diagnoses:  Closed fracture of left hip, initial encounter [facility])  Epidural hematoma [facility])    ED Discharge Orders     None          Birdena Clarity, DEVONNA 09/26/24 2005  "

## 2024-09-26 NOTE — ED Triage Notes (Signed)
 Per Ems pt coming from home after mechanical fall on tile floor. Patient c/o left hip pain- unsure if shortened and rotated due to patient not letting staff move leg. Reports hitting back of head on ground. Denies blood thinners or any LOC. Family assumes patient was on ground for about 30 mins.

## 2024-09-27 ENCOUNTER — Inpatient Hospital Stay (HOSPITAL_COMMUNITY)

## 2024-09-27 ENCOUNTER — Encounter (HOSPITAL_COMMUNITY): Payer: Self-pay | Admitting: Internal Medicine

## 2024-09-27 ENCOUNTER — Encounter (HOSPITAL_COMMUNITY)
Admission: EM | Disposition: A | Discharge status: Skilled Nursing Facility | Payer: Self-pay | Source: Home / Self Care | Attending: Internal Medicine

## 2024-09-27 DIAGNOSIS — F32A Depression, unspecified: Secondary | ICD-10-CM | POA: Diagnosis not present

## 2024-09-27 DIAGNOSIS — S72002A Fracture of unspecified part of neck of left femur, initial encounter for closed fracture: Secondary | ICD-10-CM | POA: Diagnosis not present

## 2024-09-27 HISTORY — PX: INTRAMEDULLARY (IM) NAIL INTERTROCHANTERIC: SHX5875

## 2024-09-27 LAB — CBC
HCT: 35.6 % — ABNORMAL LOW (ref 36.0–46.0)
Hemoglobin: 12.2 g/dL (ref 12.0–15.0)
MCH: 31.4 pg (ref 26.0–34.0)
MCHC: 34.3 g/dL (ref 30.0–36.0)
MCV: 91.5 fL (ref 80.0–100.0)
Platelets: 233 K/uL (ref 150–400)
RBC: 3.89 MIL/uL (ref 3.87–5.11)
RDW: 12.7 % (ref 11.5–15.5)
WBC: 11 K/uL — ABNORMAL HIGH (ref 4.0–10.5)
nRBC: 0 % (ref 0.0–0.2)

## 2024-09-27 LAB — BASIC METABOLIC PANEL WITH GFR
Anion gap: 13 (ref 5–15)
BUN: 21 mg/dL (ref 8–23)
CO2: 21 mmol/L — ABNORMAL LOW (ref 22–32)
Calcium: 9 mg/dL (ref 8.9–10.3)
Chloride: 107 mmol/L (ref 98–111)
Creatinine, Ser: 0.8 mg/dL (ref 0.44–1.00)
GFR, Estimated: >60 mL/min
Glucose, Bld: 123 mg/dL — ABNORMAL HIGH (ref 70–99)
Potassium: 3.9 mmol/L (ref 3.5–5.1)
Sodium: 140 mmol/L (ref 135–145)

## 2024-09-27 LAB — MAGNESIUM: Magnesium: 2 mg/dL (ref 1.7–2.4)

## 2024-09-27 LAB — PHOSPHORUS: Phosphorus: 2.8 mg/dL (ref 2.5–4.6)

## 2024-09-27 MED ORDER — ACETAMINOPHEN 10 MG/ML IV SOLN: 1000.0000 mg | Freq: Once | INTRAVENOUS | Status: DC | PRN

## 2024-09-27 MED ORDER — CHLORHEXIDINE GLUCONATE 0.12 % MT SOLN
15.0000 mL | Freq: Once | OROMUCOSAL | Status: AC
  Administered 2024-09-27: 15 mL via OROMUCOSAL

## 2024-09-27 MED ORDER — FENTANYL CITRATE (PF) 100 MCG/2ML IJ SOLN
INTRAMUSCULAR | Status: AC
  Filled 2024-09-27: qty 2

## 2024-09-27 MED ORDER — CEFAZOLIN SODIUM-DEXTROSE 1-4 GM/50ML-% IV SOLN
1.0000 g | Freq: Three times a day (TID) | INTRAVENOUS | Status: DC
  Administered 2024-09-27 – 2024-09-28 (×2): 1 g via INTRAVENOUS
  Filled 2024-09-27 (×4): qty 50

## 2024-09-27 MED ORDER — PHENYLEPHRINE HCL-NACL 20-0.9 MG/250ML-% IV SOLN
INTRAVENOUS | Status: DC | PRN
  Administered 2024-09-27: 50 ug/min via INTRAVENOUS

## 2024-09-27 MED ORDER — CEFAZOLIN SODIUM-DEXTROSE 2-4 GM/100ML-% IV SOLN: 2.0000 g | INTRAVENOUS | Status: DC

## 2024-09-27 MED ORDER — PHENYLEPHRINE 80 MCG/ML (10ML) SYRINGE FOR IV PUSH (FOR BLOOD PRESSURE SUPPORT)
PREFILLED_SYRINGE | INTRAVENOUS | Status: DC | PRN
  Administered 2024-09-27 (×3): 80 ug via INTRAVENOUS

## 2024-09-27 MED ORDER — LACTATED RINGERS IV SOLN: INTRAVENOUS | Status: DC | PRN

## 2024-09-27 MED ORDER — ONDANSETRON HCL 4 MG/2ML IJ SOLN
INTRAMUSCULAR | Status: DC | PRN
  Administered 2024-09-27: 4 mg via INTRAVENOUS

## 2024-09-27 MED ORDER — DEXAMETHASONE SOD PHOSPHATE PF 10 MG/ML IJ SOLN
INTRAMUSCULAR | Status: DC | PRN
  Administered 2024-09-27: 10 mg via INTRAVENOUS

## 2024-09-27 MED ORDER — ORAL CARE MOUTH RINSE: 15.0000 mL | OROMUCOSAL | Status: DC | PRN

## 2024-09-27 MED ORDER — OXYCODONE HCL 5 MG PO TABS: 5.0000 mg | ORAL_TABLET | Freq: Once | ORAL | Status: DC | PRN

## 2024-09-27 MED ORDER — ORAL CARE MOUTH RINSE: 15.0000 mL | Freq: Once | OROMUCOSAL | Status: AC

## 2024-09-27 MED ORDER — LACTATED RINGERS IV SOLN: INTRAVENOUS | Status: DC

## 2024-09-27 MED ORDER — CHLORHEXIDINE GLUCONATE 0.12 % MT SOLN
OROMUCOSAL | Status: AC
  Filled 2024-09-27: qty 15

## 2024-09-27 MED ORDER — ROCURONIUM BROMIDE 10 MG/ML (PF) SYRINGE
PREFILLED_SYRINGE | INTRAVENOUS | Status: DC | PRN
  Administered 2024-09-27: 50 mg via INTRAVENOUS

## 2024-09-27 MED ORDER — PROPOFOL 10 MG/ML IV BOLUS
INTRAVENOUS | Status: DC | PRN
  Administered 2024-09-27: 60 mg via INTRAVENOUS

## 2024-09-27 MED ORDER — SUGAMMADEX SODIUM 200 MG/2ML IV SOLN
INTRAVENOUS | Status: DC | PRN
  Administered 2024-09-27: 200 mg via INTRAVENOUS

## 2024-09-27 MED ORDER — LIDOCAINE 2% (20 MG/ML) 5 ML SYRINGE
INTRAMUSCULAR | Status: DC | PRN
  Administered 2024-09-27: 60 mg via INTRAVENOUS

## 2024-09-27 MED ORDER — FENTANYL CITRATE (PF) 100 MCG/2ML IJ SOLN: 25.0000 ug | INTRAMUSCULAR | Status: DC | PRN

## 2024-09-27 MED ORDER — ACETAMINOPHEN 500 MG PO TABS
ORAL_TABLET | ORAL | Status: AC
  Filled 2024-09-27: qty 2

## 2024-09-27 MED ORDER — TRANEXAMIC ACID-NACL 1000-0.7 MG/100ML-% IV SOLN
1000.0000 mg | INTRAVENOUS | Status: AC
  Administered 2024-09-27: 1000 mg via INTRAVENOUS

## 2024-09-27 MED ORDER — OXYCODONE HCL 5 MG/5ML PO SOLN: 5.0000 mg | Freq: Once | ORAL | Status: DC | PRN

## 2024-09-27 MED ORDER — CEFAZOLIN SODIUM-DEXTROSE 2-3 GM-%(50ML) IV SOLR
INTRAVENOUS | Status: DC | PRN
  Administered 2024-09-27: 2 g via INTRAVENOUS

## 2024-09-27 MED ORDER — CEFAZOLIN SODIUM-DEXTROSE 2-4 GM/100ML-% IV SOLN
INTRAVENOUS | Status: AC
  Filled 2024-09-27: qty 100

## 2024-09-27 MED ORDER — ENSURE PLUS HIGH PROTEIN PO LIQD
237.0000 mL | Freq: Two times a day (BID) | ORAL | Status: DC
  Administered 2024-09-28 – 2024-09-30 (×3): 237 mL via ORAL

## 2024-09-27 MED ORDER — FENTANYL CITRATE (PF) 250 MCG/5ML IJ SOLN
INTRAMUSCULAR | Status: DC | PRN
  Administered 2024-09-27 (×2): 50 ug via INTRAVENOUS

## 2024-09-27 MED ORDER — ACETAMINOPHEN 500 MG PO TABS
1000.0000 mg | ORAL_TABLET | Freq: Once | ORAL | Status: AC
  Administered 2024-09-27: 1000 mg via ORAL

## 2024-09-27 MED ORDER — 0.9 % SODIUM CHLORIDE (POUR BTL) OPTIME
TOPICAL | Status: DC | PRN
  Administered 2024-09-27: 1000 mL

## 2024-09-27 MED ORDER — CHLORHEXIDINE GLUCONATE CLOTH 2 % EX PADS
6.0000 | MEDICATED_PAD | Freq: Every day | CUTANEOUS | Status: DC
  Administered 2024-09-28 – 2024-09-30 (×2): 6 via TOPICAL

## 2024-09-27 MED ORDER — TRANEXAMIC ACID-NACL 1000-0.7 MG/100ML-% IV SOLN
INTRAVENOUS | Status: AC
  Filled 2024-09-27: qty 100

## 2024-09-27 MED ORDER — DROPERIDOL 2.5 MG/ML IJ SOLN: 0.6250 mg | Freq: Once | INTRAMUSCULAR | Status: DC | PRN

## 2024-09-27 NOTE — Anesthesia Postprocedure Evaluation (Signed)
"   Anesthesia Post Note  Patient: Brittany Mahoney  Procedure(s) Performed: LEFT HIP FIXATION, FRACTURE, INTERTROCHANTERIC, WITH INTRAMEDULLARY ROD (Left)     Patient location during evaluation: PACU Anesthesia Type: General Level of consciousness: awake and alert Pain management: pain level controlled Vital Signs Assessment: post-procedure vital signs reviewed and stable Respiratory status: spontaneous breathing, nonlabored ventilation, respiratory function stable and patient connected to nasal cannula oxygen Cardiovascular status: blood pressure returned to baseline and stable Postop Assessment: no apparent nausea or vomiting Anesthetic complications: no   No notable events documented.  Last Vitals:  Vitals:   09/27/24 1418 09/27/24 1623  BP: 116/87 113/70  Pulse: 97 (!) 102  Resp: 16 16  Temp: (!) 36.4 C (!) 36.4 C  SpO2: 96% 98%    Last Pain:  Vitals:   09/27/24 1623  TempSrc: Oral  PainSc:                  Thom JONELLE Peoples      "

## 2024-09-27 NOTE — Anesthesia Procedure Notes (Signed)
 Procedure Name: Intubation Date/Time: 09/27/2024 11:35 AM  Performed by: Arvell Edsel HERO, CRNAPre-anesthesia Checklist: Patient identified, Emergency Drugs available, Suction available, Patient being monitored and Timeout performed Patient Re-evaluated:Patient Re-evaluated prior to induction Oxygen Delivery Method: Circle system utilized Preoxygenation: Pre-oxygenation with 100% oxygen Induction Type: IV induction Ventilation: Mask ventilation without difficulty Laryngoscope Size: Glidescope and 3 Grade View: Grade I Tube type: Oral Tube size: 7.0 mm Number of attempts: 1 Airway Equipment and Method: Patient positioned with wedge pillow and Video-laryngoscopy Placement Confirmation: positive ETCO2, ETT inserted through vocal cords under direct vision and breath sounds checked- equal and bilateral Secured at: 22 cm Tube secured with: Tape Dental Injury: Teeth and Oropharynx as per pre-operative assessment

## 2024-09-27 NOTE — Brief Op Note (Signed)
 09/26/2024 - 09/27/2024  9:45 AM  1:11 PM  PATIENT:  Brittany Mahoney  89 y.o. female  PRE-OPERATIVE DIAGNOSIS:  left hip fracture  POST-OPERATIVE DIAGNOSIS:  left hip fracture  PROCEDURE:  Procedures: LEFT HIP FIXATION, FRACTURE, INTERTROCHANTERIC, WITH INTRAMEDULLARY ROD (Left)  SURGEON:  Surgeons and Role:    DEWAINE Germaine Redbird, MD - Primary  PHYSICIAN ASSISTANT:   ASSISTANTS: Richerd Mariner, PA-C  ANESTHESIA:   general  EBL:  100 mL   BLOOD ADMINISTERED:none  DRAINS: none   LOCAL MEDICATIONS USED:  NONE  SPECIMEN:  No Specimen  DISPOSITION OF SPECIMEN:  N/A  COUNTS:  YES  TOURNIQUET:  * No tourniquets in log *  DICTATION: .Dragon Dictation  PLAN OF CARE: Admit to inpatient   PATIENT DISPOSITION:  PACU - hemodynamically stable.   Delay start of Pharmacological VTE agent (>24hrs) due to surgical blood loss or risk of bleeding: no

## 2024-09-27 NOTE — Progress Notes (Signed)
.. ° ° °  PROCEDURAL EXPEDITER PROGRESS NOTE  Patient Name: Brittany Mahoney  DOB:04/29/1935 Date of Admission: 09/26/2024  Date of Assessment:09/27/24   -------------------------------------------------------------------------------------------------------------------   Brief clinical summary: Pt to OR today for left hip fracture intramedullary nail  Orders in place:  Yes   Labs, test, and orders reviewed: Y  Requires surgical clearance:  Yes - needed clearance from Neuro for the small head bleed. Clearance received  Barriers noted: ED to ED vs ED to Pre-op. It was questioned if the clearance was already done.   Intervention provided by [facility] team: Worked with [facility], anesthesia and pre-op to determine if pt still needed clearance before coming to pre-op.   Barrier resolved:  yes   -------------------------------------------------------------------------------------------------------------------  [employer], [place], [place] Please contact us  directly via secure chat (search for [facility]) or by calling us  at [phone] [facility]).

## 2024-09-27 NOTE — Progress Notes (Signed)
 Repeat CT head is stable.  Recommendations: Okay to proceed with orthopedic surgery from my standpoint No further imaging needed SBP<160 Ok for DVT chemoppx on 1/20 If needed, ok for therapeutic anticoagulation or antiplatelets on 1/23 Follow-up prn

## 2024-09-27 NOTE — ED Notes (Signed)
 [facility] at bedside to take pt to [facility].   Brittany Mahoney has been updated and notified of pt being transferred and will meet her at [facility].

## 2024-09-27 NOTE — Progress Notes (Signed)
 " PROGRESS NOTE   Brittany Mahoney  FMW:[id number] DOB: Feb 06, 1935 DOA: 09/26/2024 PCP: [name], MD   Chief Complaint  Patient presents with   Fall   Level of care: Telemetry  Brief Admission History:  89 y.o. female with medical history significant for recurrent UTIs, mild dementia, chronic bilateral low back pain without sciatica, presents to the ED after mechanical fall at home.  The patient lives alone.  She answered a phone call at home, turned around, tripped, and fell.  Denies any chest pain.  She was in her usual state of health prior to this event.  EMS was activated.  Patient was brought into the ER for further evaluation.   Vital signs are stable.  Workup in the ER revealed left acute comminuted and displaced intertrochanteric fracture of the left femur with varus angulation.  UA positive for pyuria.   EDP discussed the case with orthopedic surgery who recommended admission by medicine team and to transfer to [facility] for planned surgical fixation in the OR.     Noncontrast head CT showing small lenticular shaped extra-axial hyperdense hemorrhage in the posterior right parietal region, possibly epidural or subdural measuring 5 mm in thickness without overlying fracture, mass effect, or midline shift.   EDP discussed the case with neurosurgery recommended conservative management with observation.  Repeat noncontrast head CT at 5 AM on 09/27/2024.     N.p.o. after midnight.  Admitted by [facility], hospitalist.    Assessment and Plan:  Closed left femoral neck fracture, post mechanical fall Pain control N.p.o. Plan for orthopedic surgical repair on 09/27/2024 No medical contraindication to proceed with surgery today  Pt medically cleared for surgery today given repeat CT brain is stable  I discussed this with surgeon Dr. Germaine    Presumed urinary tract infection, POA Follow urine culture for ID and sensitivities Rocephin  empirically De-escalate antibiotics as  able.  Stable small right parietal 4 mm extraaxial hemorrhage  Neurosurgery consulted and repeat CT this morning which was stable  Ok to proceed with orthopedic surgery Hold all anticoagulation and antiplatelets   Alzheimer dementia Reorient as needed. Fall precautions  DVT prophylaxis: SCDs Code Status: DNR Family Communication:  Disposition: transfer to [facility] for surgery   Consultants:  Orthopedics Neurosurgery  Procedures:  Tentative ORIF Antimicrobials:    Subjective: Pt confused but says she is not in a lot of pain, she wants to get up out of bed  Objective: Vitals:   09/27/24 0830 09/27/24 0900 09/27/24 0908 09/27/24 0909  BP: 137/68 135/71 (!) 114/100   Pulse:    94  Resp:   (!) 23 (!) 21  Temp:      TempSrc:      SpO2:    95%  Weight:      Height:        Intake/Output Summary (Last 24 hours) at 09/27/2024 0918 Last data filed at 09/27/2024 0110 Gross per 24 hour  Intake 73.33 ml  Output --  Net 73.33 ml   Filed Weights   09/26/24 1558  Weight: 64.4 kg   Examination:  General exam: Appears calm and comfortable  Respiratory system: Clear to auscultation. Respiratory effort normal. Cardiovascular system: normal S1 & S2 heard. No JVD, murmurs, rubs, gallops or clicks. No pedal edema. Gastrointestinal system: Abdomen is nondistended, soft and nontender. No organomegaly or masses felt. Normal bowel sounds heard. Central nervous system: Alert and oriented. No focal neurological deficits. Extremities: shortened and slightly rotated left leg Skin: No  rashes, lesions or ulcers. Psychiatry: Judgement and insight appear normal. Mood & affect appropriate.   Data Reviewed: I have personally reviewed following labs and imaging studies  CBC: Recent Labs  Lab 09/26/24 1903 09/27/24 0524  WBC 15.7* 11.0*  NEUTROABS 13.4*  --   HGB 13.0 12.2  HCT 39.6 35.6*  MCV 93.0 91.5  PLT 263 233    Basic Metabolic Panel: Recent Labs  Lab 09/26/24 1903  09/27/24 0524  NA 141 140  K 3.8 3.9  CL 106 107  CO2 21* 21*  GLUCOSE 136* 123*  BUN 19 21  CREATININE 1.00 0.80  CALCIUM 9.4 9.0  MG  --  2.0  PHOS  --  2.8    CBG: No results for input(s): GLUCAP in the last 168 hours.  No results found for this or any previous visit (from the past 240 hours).   Radiology Studies: CT HEAD WO CONTRAST ( ) Result Date: 09/27/2024 EXAM: CT HEAD WITHOUT CONTRAST 09/27/2024 05:09:00 AM TECHNIQUE: CT of the head was performed without the administration of intravenous contrast. Automated exposure control, iterative reconstruction, and/or weight based adjustment of the mA/kV was utilized to reduce the radiation dose to as low as reasonably achievable. COMPARISON: 09/26/2024 CLINICAL HISTORY: Head trauma, minor (Age >= 65y) Head trauma, minor (Age >= 65y) Head trauma, minor (Age >= 65y) FINDINGS: BRAIN AND VENTRICLES: Stable small right parietal 4 mm extraaxial hemorrhage. Prominence of sulci and ventricles compatible with cerebral volume loss. Nonspecific hypoattenuation within periventricular and subcortical white matter, likely representing sequela of chronic microvascular ischemic change. No evidence of acute infarct. No mass effect or midline shift. ORBITS: No acute abnormality. SINUSES: Right mastoid effusion. SOFT TISSUES AND SKULL: No acute soft tissue abnormality. No skull fracture. IMPRESSION: 1. Stable small right parietal 4 mm extraaxial hemorrhage. 2. Prominence of sulci and ventricles compatible with cerebral volume loss. 3. Nonspecific hypoattenuation within periventricular and subcortical white matter, likely representing sequela of chronic microvascular ischemic change. Electronically signed by: Evalene Coho MD 09/27/2024 05:34 AM EST RP Workstation: [id number]   DG Chest Portable 1 View Result Date: 09/26/2024 EXAM: 1 VIEW(S) XRAY OF THE CHEST 09/26/2024 08:25:54 PM COMPARISON: None available. CLINICAL HISTORY: fall fall fall fall  FINDINGS: LUNGS AND PLEURA: No focal pulmonary opacity. No pleural effusion. No pneumothorax. HEART AND MEDIASTINUM: No acute abnormality of the cardiac and mediastinal silhouettes. BONES AND SOFT TISSUES: No acute osseous abnormality. IMPRESSION: 1. No acute process. Electronically signed by: Greig Pique MD 09/26/2024 08:26 PM EST RP Workstation: [id number]   DG Shoulder Left Result Date: 09/26/2024 EXAM: 1 VIEW(S) XRAY OF THE LEFT SHOULDER 09/26/2024 06:37:08 PM COMPARISON: None available. CLINICAL HISTORY: fall fall fall FINDINGS: BONES AND JOINTS: Glenohumeral joint is normally aligned. No acute fracture. No malalignment. The [facility] joint is unremarkable. SOFT TISSUES: No abnormal calcifications. Visualized lung is unremarkable. IMPRESSION: 1. No evidence of acute injury. Electronically signed by: Greig Pique MD 09/26/2024 06:51 PM EST RP Workstation: [id number]   DG Hip Unilat W or Wo Pelvis 2-3 Views Left Result Date: 09/26/2024 EXAM: 2 or 3 VIEW(S) XRAY OF THE BILATERAL HIP 09/26/2024 06:37:08 PM COMPARISON: 03/10/2024 CLINICAL HISTORY: fall fall fall fall fall FINDINGS: BONES AND JOINTS: Acute comminuted and displaced intertrochanteric fracture of the left femur with varus angulation. Partially visualized intramedullary nail fixation of the right femur in place. Old posttraumatic and postsurgical changes of the right femur. LUMBAR SPINE: Degenerative changes of the lower lumbar spine. SOFT TISSUES: Unremarkable. IMPRESSION: 1. Acute comminuted and displaced intertrochanteric  fracture of the left femur with varus angulation. 2. Old posttraumatic and postsurgical changes of the right femur with partially visualized intramedullary nail fixation in place. Electronically signed by: Greig Pique MD 09/26/2024 06:51 PM EST RP Workstation: [id number]   CT Cervical Spine Wo Contrast Result Date: 09/26/2024 EXAM: CT CERVICAL SPINE WITHOUT CONTRAST 09/26/2024 06:16:37 PM TECHNIQUE: CT of the cervical spine was  performed without the administration of intravenous contrast. Multiplanar reformatted images are provided for review. Automated exposure control, iterative reconstruction, and/or weight based adjustment of the mA/kV was utilized to reduce the radiation dose to as low as reasonably achievable. COMPARISON: 06/19/2024 CLINICAL HISTORY: Neck trauma (Age >= 65y) FINDINGS: BONES AND ALIGNMENT: No acute fracture. Slight anterolisthesis of C4 on C5 and retrolisthesis of C6 on C7. DEGENERATIVE CHANGES: Advanced degenerative disc disease from C4-C5 through C6-C7. Advanced bilateral degenerative facet disease diffusely. SOFT TISSUES: No prevertebral soft tissue swelling. IMPRESSION: 1. No evidence of acute traumatic injury. 2. Degenerative disc and facet disease as above. Electronically signed by: Franky Crease MD 09/26/2024 06:27 PM EST RP Workstation: [id number]   CT Head Wo Contrast Result Date: 09/26/2024 EXAM: CT HEAD WITHOUT CONTRAST 09/26/2024 06:16:37 PM TECHNIQUE: CT of the head was performed without the administration of intravenous contrast. Automated exposure control, iterative reconstruction, and/or weight based adjustment of the mA/kV was utilized to reduce the radiation dose to as low as reasonably achievable. COMPARISON: 06/19/2024 CLINICAL HISTORY: Head trauma, minor (Age >= 65y) FINDINGS: BRAIN AND VENTRICLES: Small lenticular-shaped extraaxial hyperdense hemorrhage noted in the posterior right parietal region measuring 5 mm in thickness. This could reflect a small epidural or subdural hemorrhage. No mass effect or midline shift. Atrophy and chronic small vessel disease throughout the deep white matter. No evidence of acute infarct. No hydrocephalus. ORBITS: No acute abnormality. SINUSES: No acute abnormality. SOFT TISSUES AND SKULL: No acute soft tissue abnormality. No overlying fracture. IMPRESSION: 1. Small lenticular-shaped extraaxial hyperdense hemorrhage in the posterior right parietal region, possibly  epidural or subdural, measuring 5 mm in thickness, without overlying fracture, mass effect, or midline shift. 2. These results were called to Dr. Birdena at the time of interpretation. Electronically signed by: Franky Crease MD 09/26/2024 06:25 PM EST RP Workstation: [id number]   Scheduled Meds: Continuous Infusions:  cefTRIAXone  (ROCEPHIN )  IV Stopped (09/27/24 0110)   lactated ringers  50 mL/hr at 09/27/24 0917     LOS: 1 day   Time spent: 50 mins  Tiffaney Heimann Vicci, MD How to contact the [facility] Attending or Consulting provider 7A - 7P or covering provider during after hours 7P -7A, for this patient?  Check the care team in [facility] and look for a) attending/consulting [facility] provider listed and b) the TR[facility] team listed Log into [email or web address] to find provider on call.  Locate the [facility] provider you are looking for under [facility] and page to a number that you can be directly reached. If you still have difficulty reaching the provider, please page the [facility] (Director on Call) for the Hospitalists listed on amion for assistance.  09/27/2024, 9:18 AM    "

## 2024-09-27 NOTE — Op Note (Signed)
 PATIENT ID:      Brittany Mahoney  MRN:     [id number] DOB/AGE:    09/17/1934 / 89 y.o.                                             _____________________________           Brittany Mahoney        I assisted Surgeon:  DEWAINE Dale Hock, MD   ON THE PROCEDURE:  Procedures: LEFT HIP FIXATION, FRACTURE, INTERTROCHANTERIC, WITH INTRAMEDULLARY ROD on 09/27/2024    I provided my assistance on this case for assistance with exposure, retraction, bleeding control, instrumentation, and closure.         Electronicallly signed by SURGERY ASSISTING PHYSICIAN ASSISTANT  Richerd CINDERELLA Bolk 09/27/2024  8:45 PM

## 2024-09-27 NOTE — Hospital Course (Signed)
 89 y.o. female with past medical history significant for recurrent UTIs, mild dementia, chronic bilateral low back pain without sciatica, presented to the ED after mechanical fall at home.  Vitals were stable.  CBC showed WBC at 13.0.  Hemoglobin of 9.3.  X-ray of the hips showed revealed left acute comminuted and displaced intertrochanteric fracture of the left femur with varus angulation.  UA positive for pyuria. Non-contrast head CT showing small lenticular shaped extra-axial hyperdense hemorrhage in the posterior right parietal region, possibly epidural or subdural measuring 5 mm in thickness without overlying fracture, mass effect, or midline shift..  ED provider discussed the case with neurosurgery recommended conservative management with observation  Assessment and Plan:   Closed left femoral neck fracture, post mechanical fall Status post orthopedic surgical repair on 09/27/2024.  Further care as per orthopedics.  Continue PT OT evaluation. Presumed urinary tract infection, POA Follow urine culture and sensitivity.  Continue IV Rocephin .  Complete 3-day course.   Stable small right parietal 4 mm extraaxial hemorrhage  Neurosurgery was consulted during hospitalization and CT head scan was repeated which remained stable so patient was considered stable for orthopedic surgery.  Anticoagulation and antiplatelets on hold   Alzheimer dementia Continue delirium precautions.  Fall precautions.

## 2024-09-27 NOTE — Discharge Instructions (Signed)
 Orthopaedic Surgery Discharge Instructions  Surgery: Intramedullary fixation of left hip fracture  Weight bearing: Weightbearing as tolerated using walker  Dressings/Incisions: Keep clean and dry at all times. If dressings become wet or soiled, they should be changed with regular dry gauze or a clean bandage. It is OK to shower, gently pat incision or dressing dry afterwards. Leave steri-strips in place until they fall off on their own. DO NOT put anything on your incisions (cream, ointment, lotion, etc).   Follow up appointment: You are scheduled to follow up with Dr. Germaine. If you do not know when your follow up appointment is, please call [phone] to schedule your appointment.

## 2024-09-27 NOTE — Progress Notes (Addendum)
 " Progress Note   Patient: BRENT NOTO FMW:[id number] DOB: 1934/11/27 DOA: 09/26/2024     1 DOS: the patient was seen and examined on 09/27/2024    Brief hospital course: 89 y.o. female with medical history significant for recurrent UTIs, mild dementia, chronic bilateral low back pain without sciatica who presented to the ED at [facility] after a mechanical fall at home.  Workup in the ER revealed left acute comminuted and displaced intertrochanteric fracture of the left femur with varus angulation.  UA positive for pyuria.   EDP discussed the case with orthopedic surgery who recommended admission by medicine team and to transfer to [facility] for planned surgical fixation in the OR.     Noncontrast head CT showing small lenticular shaped extra-axial hyperdense hemorrhage in the posterior right parietal region, possibly epidural or subdural measuring 5 mm in thickness without overlying fracture, mass effect, or midline shift. EDP discussed the case with neurosurgery recommended conservative management with observation.  Noncontrast head CT was repeated on 09/27/2024 and noted to be stable.    Patient underwent IM fixation of left hip fracture by Orthopedics on 1/18.   Assessment and Plan:  Closed left femoral neck fracture, post mechanical fall Orthopedics consulted. Patient transferred to [facility] for surgery. Patient now s/p IM fixation of left hip by orthopedics on 1/18. Per orthopedics, Weightbearing as tolerated using a walker Dressing: Leave Aquacel in place until postop visit.  Okay to change with regular dry gauze if needed OK to resume DVT chemoppx or anticoagulation on post op day 1; recommend 4 weeks of chemoppx with ASA 81 BID if no other AC is in place  Perioperative ancef  for 24 PT beginning on POD#1 Discharge instructions placed in Epic   - Will hold off DVT ppx till 1/20 as recommended by Neurosurgery in light of intracranial hemorrhage as indicated below.  Presumed  urinary tract infection, POA Follow urine culture for ID and sensitivities Rocephin  empirically De-escalate antibiotics as able.   Stable small right parietal 4 mm extraaxial hemorrhage  Noted on CT done on admission.  Neurosurgery consulted and repeated CT 1/18, which was stable  Per neurosurgery,  1. No further imaging needed 2. SBP<160 3. Ok for DVT chemoppx on 1/20 4. If needed, ok for therapeutic anticoagulation or antiplatelets on 1/23 5. Follow-up prn  Alzheimer dementia Reorient as needed. Fall precautions     Subjective: Patient seen postoperatively and says she feels alright. Pain is well controlled.   Physical Exam: BP 116/87 (BP Location: Left Arm)   Pulse 97   Temp 98.9 F (37.2 C)   Resp 16   Ht 5' 6 (1.676 m)   Wt 64.4 kg   SpO2 96%   BMI 22.92 kg/m     General: Alert, oriented X3  Eyes: Pupils equal, reactive  Oral cavity: moist mucous membranes  Head: Atraumatic, normocephalic  Neck: supple  Chest: clear to auscultation. No crackles, no wheezes  CVS: S1,S2 RRR. No murmurs  Abd: No distention, soft, non-tender. No masses palpable  Neurological: Grossly intact.    Data Reviewed:    Latest Ref Rng & Units 09/27/2024    5:24 AM 09/26/2024    7:03 PM 06/21/2024    4:13 AM  CBC  WBC 4.0 - 10.5 K/uL 11.0  15.7  5.2   Hemoglobin 12.0 - 15.0 g/dL 87.7  86.9  86.1   Hematocrit 36.0 - 46.0 % 35.6  39.6  40.8   Platelets 150 - 400 K/uL  233  263  202       Latest Ref Rng & Units 09/27/2024    5:24 AM 09/26/2024    7:03 PM 06/21/2024    4:13 AM  BMP  Glucose 70 - 99 mg/dL 876  863  88   BUN 8 - 23 mg/dL 21  19  12    Creatinine 0.44 - 1.00 mg/dL 9.19  8.99  9.23   Sodium 135 - 145 mmol/L 140  141  137   Potassium 3.5 - 5.1 mmol/L 3.9  3.8  3.8   Chloride 98 - 111 mmol/L 107  106  103   CO2 22 - 32 mmol/L 21  21  25    Calcium 8.9 - 10.3 mg/dL 9.0  9.4  8.9      Family Communication: Spoke with family at bedside.  Disposition: Status is:  Inpatient  DVT ppx: on hold till 1/20       Author: MDALA-GAUSI, GOLDEN PILLOW, MD 09/27/2024 2:30 PM  For on call review [email or web address].    "

## 2024-09-27 NOTE — ED Notes (Signed)
 Report given to Preop/OR per charge.

## 2024-09-27 NOTE — Consult Note (Signed)
 Orthopedic Consultation Note  Current Hospital Day : Hospital Day: 2  Reason For Consult: Left intertrochanteric hip fracture  History of Present Illness:  Brittany Mahoney is a 89 y.o. female who who had a fall and presented to [facility], ER yesterday evening.  She found of a left intertrochanteric hip fracture.  I was called as orthopedics on-call to evaluate and recommended transfer to [facility] for surgical management.  Presently she complains of left hip pain.  Her family is at bedside.  They are understanding of the recommended treatment plan due to her previous intertrochanteric hip fracture on contralateral side in 2017.  At baseline she lives at home by herself and ambulates using a walker.  Past Medical History:  Diagnosis Date   Age-related osteoporosis without current pathological fracture    Arthritis    Arthropathy, unspecified    Broken hip (HCC)    Cancer (HCC)    uterus   Chronic kidney disease, stage 1    Closed right hip fracture (HCC) 04/09/2016   Endometrial cancer, grade I (HCC) 03/21/2015   Fracture of unspecified part of neck of right femur, sequela    Glaucoma    Headache(784.0)    migraines after periods   Hepatitis    Hx of cancer of endometrium 03/06/2013   Osteoporosis    Other abnormalities of gait and mobility    Other amnesia    Pain in left knee    Pain in right foot    Pain in right shoulder    Personal history of (healed) traumatic fracture    PMB (postmenopausal bleeding)    Repeated falls    Tremor, unspecified     Past Surgical History:  Procedure Laterality Date   ABDOMINAL HYSTERECTOMY  2007   TAH and BSO   COLONOSCOPY  07/31/2006   Dr.Rourk   DILATION AND CURETTAGE OF UTERUS     HIP FRACTURE SURGERY     INTRAMEDULLARY (IM) NAIL INTERTROCHANTERIC Right 04/10/2016   Procedure: INTRAMEDULLARY (IM) NAIL RIGHT HIP FRACTURE;  Surgeon: Brittany CINDERELLA Poli, MD;  Location: [facility] OR;  Service: Orthopedics;  Laterality: Right;    Prior to  Admission medications  Medication Sig Start Date End Date Taking? Authorizing Provider  acetaminophen  (TYLENOL ) 500 MG tablet Take 2 tablets (1,000 mg total) by mouth every 8 (eight) hours as needed for mild pain (pain score 1-3). 03/12/24  Yes Rai, Ripudeep K, MD  latanoprost  (XALATAN ) 0.005 % ophthalmic solution Place 1 drop into both eyes daily. 03/27/24  Yes Brittany Barnie RAMAN, NP  LUMIGAN  0.01 % SOLN Place 1 drop into both eyes at bedtime. 03/27/24  Yes Brittany Barnie RAMAN, NP  naproxen  (NAPROSYN ) 500 MG tablet Take 1 tablet (500 mg total) by mouth 2 (two) times daily as needed for moderate pain (pain score 4-6). 08/13/24  Yes Brittany Jacqulyn MATSU, DO    Physical Examination Left lower Extremity: Skin intact + ankle dorsiflexion/plantarflexion/EHL SILT SP/DP/T Foot wwp   Imaging: X-rays of the left hip reviewed interpreted demonstrating intertrochanteric left hip fracture with some comminution.  Assessment:   Brittany Mahoney is a 89 y.o. female with left intertrochanteric hip fracture.  We discussed nonoperative management versus internal fixation Ris and benefits of both.  Patient and family were in favor of proceeding with internal fixation.  Discussed risk of nonunion, malunion, neurovascular injury, hardware failure, hip or leg pain and stiffness as well as risk of anesthesia among others.  Of note she was noted to have a  small head bleed which was stable on CT scan.  No further optimization per medicine and anesthesia who the case was discussed with  Plan:   OR today for L hip fracture fixation Appreciate medicine pre-op eval/optimization

## 2024-09-27 NOTE — Op Note (Signed)
 Operative Note  Brittany Mahoney  Surgery Date: 09/26/2024 - 09/27/2024 Surgeon: Dale Hock, MD Assistant(s): Richerd Mariner, PA-C  Preop Diagnosis(es):  Left intertrochanteric hip fracture  Postop Diagnosis(es):  Left intertrochanteric hip fracture  Operative Procedure(s): Intramedullary fixation of left intertrochanteric hip fracture   Anesthesia: The patient had administration of general anesthesia. Further details can be found in the anesthesia record.  Estimated Blood Loss: 100 mL  Complications:  None noted intraoperatively  Drains: None  Implants:  [employer] 38 x 11.5 nail with 100 mm lag screw. Full detailed list below.  Indications:  Brittany Mahoney is a 89 y.o. year old female who presented to [facility] emergency room due to hip pain after a fall.  She was found have intertrochanteric hip fracture.  We discussed nonoperative and surgical treatment with her and her POA that her preference was to proceed with surgical management.  She was transferred to [facility] for this procedure..  After thorough discussion of the risks and benefits of surgical management and alternative nonoperative treatment options, they elected to proceed with surgical treatment. Risks and complications were discussed and understood including, but not limited to, bleeding, infection, stiffness, numbness, damage to surrounding structures (including blood vessels and nerves), failure of the procedure, need for secondary or revision procedures, failure of healing, incomplete functional recovery, and worsening or chronic pain. Additional risks pertinent to the surgery and anesthesia also include pulmonary compromise, blood clots/pulmonary embolism, cardiac complications, and death. No guarantees were stated or implied. All questions were answered to the best of my ability and the patient verbalized understanding.    Description of Procedure:  The patient was identified in the holding area,  taken to the operating room and underwent successful induction of anesthesia. They were then placed on the operating table in a supine position on the Hana table, with all bony prominences well padded. Preprocedure antibiotics were administered. A time out was performed and all parties were in agreement with the patient identification, surgical site and planned procedure. The extremity was then prepped and draped in usual sterile fashion. A second time out was performed prior to incision, once again confirming the patient, site, procedure and expectations of surgery.  C-arm was brought in and the fracture was reduced with traction, and internal rotation.   Incision was made just proximal to the tip of the greater trochanter. Dissection was taken down through skin and subcutaneous tissue with scalpel. The guide pin was placed through the incision down to the tip of the greater trochanter and once an adequate start site was obtained on biplanar fluoroscopy, the guide pin was advanced into the proximal femur down to the level of the lesser trochanter.  Next, the opening reamer was used to ream out the proximal femur over the guide pin, while using a soft tissue guide.   A ball tipped guide rod was inserted and taken down to the distal femur.  Location was confirmed with biplanar image.  A depth gauge was used to determine length and sequential reaming was performed over the guide rod until up to a size 13 reamer The size 11.5 nail was opened and assembled on the back table. The nail was then inserted into the femoral canal and taken down until it was fully seated. Biplanar fluoroscopy confirmed intramedullary location.  A lateral incision was then made for the insertion of the lateral guide, through skin, subcutaneous tissue and iliotibial band. Through the lateral guide, a guide pin was advanced into  the neck and head of the femur. It was confirmed to be in adequate position, center center on the AP and  lateral views of the hip.  The measuring guide was used to determine that a 100 mm length screw would be needed.  Next, the lag screw drill was used through the lateral guide, reconfirming our screw length based on the drill. The lag screw was then inserted on hand. Proximally, the set screw was tightened to lock rotation.  A secondary compression screw was then placed inferiorly for further fixation.  One distal interlocking screw was placed after bicortical drilling using a perfect circle technique.    The proximal guide was then removed. Final x-rays were obtained showing adequate fracture reduction and stabilization with restoration of the valgus neck angle,  as well as nail and screw length and placement. The wounds were then thoroughly irrigated and closed in layered fashion with 0 Vicryl, 2-0 monocryl and 3-0 monocryl on the skin.     All counts at the end of the case were correct.  Post-Operative Condition: The patient was transferred to the PACU in stable condition.  Post-Operative Plan: They will be weightbearing as tolerated using walker.  They will follow our hip fracture rehabilitation protocol.  Return to clinic in 2 weeks for first postop visit.  Implant Record:  Implant Name Type Inv. Item Serial No. Manufacturer Lot No. LRB No. Used Action  NAIL TRIGEN LEFT 11.5X38-125 - [id number] Nail NAIL TRIGEN LEFT 11.5X38-125  [facility] [employer] [id number] Left 1 Implanted  SCREW LAG COMPR KIT 100/95 - [id number] Screw SCREW LAG COMPR KIT 100/95  [facility] [employer] [id number] Left 1 Implanted  SCREW TRIGEN LOW PROF 5.0X37.5 - [id number] Screw SCREW TRIGEN LOW PROF 5.0X37.5  [employer] [id number] Left 1 Implanted

## 2024-09-27 NOTE — Anesthesia Preprocedure Evaluation (Addendum)
"                                    Anesthesia Evaluation  Patient identified by MRN, date of birth, ID band Patient awake    Reviewed: Allergy & Precautions, NPO status , Patient's Chart, lab work & pertinent test results  History of Anesthesia Complications Negative for: history of anesthetic complications  Airway Mallampati: II  TM Distance: >3 FB Neck ROM: Full    Dental no notable dental hx. (+)    Pulmonary neg COPD   Pulmonary exam normal breath sounds clear to auscultation       Cardiovascular (-) angina (-) Past MI Normal cardiovascular exam Rhythm:Regular Rate:Normal     Neuro/Psych  Headaches, neg Seizures PSYCHIATRIC DISORDERS  Depression   Dementia    GI/Hepatic ,neg GERD  ,,(+) Hepatitis -  Endo/Other    Renal/GU Renal InsufficiencyRenal disease     Musculoskeletal  (+) Arthritis ,  left hip fracture   Abdominal   Peds  Hematology   Anesthesia Other Findings   Reproductive/Obstetrics                              Anesthesia Physical Anesthesia Plan  ASA: 3  Anesthesia Plan: General   Post-op Pain Management: Tylenol  PO (pre-op)*   Induction: Intravenous  PONV Risk Score and Plan: 3 and Ondansetron , Dexamethasone  and Treatment may vary due to age or medical condition  Airway Management Planned: Oral ETT  Additional Equipment: None  Intra-op Plan:   Post-operative Plan: Extubation in OR  Informed Consent: I have reviewed the patients History and Physical, chart, labs and discussed the procedure including the risks, benefits and alternatives for the proposed anesthesia with the patient or authorized representative who has indicated his/her understanding and acceptance.   Patient has DNR.  Discussed DNR with patient, Discussed DNR with power of attorney and Continue DNR.   Dental advisory given and Consent reviewed with POA  Plan Discussed with: CRNA  Anesthesia Plan Comments:           Anesthesia Quick Evaluation  "

## 2024-09-27 NOTE — ED Notes (Signed)
 Paged Dr Vicci to Dr Germaine . About transfer

## 2024-09-27 NOTE — Progress Notes (Signed)
 Orthopaedic Surgery Progress Note  Resting in PACU.  No acute events  Exam: Left Lower Extremity: Dressings clean dry and intact + ankle dorsiflexion/plantarflexion/EHL SILT SP/DP/T Foot wwp   Assessment: 89 y.o. female * Day of Surgery * status post intramedullary fixation of left intertrochanteric hip fracture  Plan: Weightbearing as tolerated using a walker Dressing: Leave Aquacel in place until postop visit.  Okay to change with regular dry gauze if needed OK to resume DVT chemoppx or anticoagulation on post op day 1; recommend 4 weeks of chemoppx with ASA 81 BID if no other AC is in place Perioperative ancef  for 24 PT beginning on POD#1 Discharge instructions placed in Epic

## 2024-09-27 NOTE — Transfer of Care (Signed)
 Immediate Anesthesia Transfer of Care Note  Patient: Brittany Mahoney  Procedure(s) Performed: LEFT HIP FIXATION, FRACTURE, INTERTROCHANTERIC, WITH INTRAMEDULLARY ROD (Left)  Patient Location: PACU  Anesthesia Type:General  Level of Consciousness: awake  Airway & Oxygen Therapy: Patient Spontanous Breathing and Patient connected to face mask oxygen  Post-op Assessment: Report given to RN, Post -op Vital signs reviewed and stable, Patient moving all extremities, and Patient moving all extremities X 4  Post vital signs: Reviewed and stable  Last Vitals:  Vitals Value Taken Time  BP 145/84 09/27/24 13:31  Temp    Pulse 98 09/27/24 13:35  Resp 16 09/27/24 13:35  SpO2 96 % 09/27/24 13:35  Vitals shown include unfiled device data.  Last Pain:  Vitals:   09/27/24 1033  TempSrc: Oral  PainSc: 0-No pain         Complications: No notable events documented.

## 2024-09-28 ENCOUNTER — Encounter (HOSPITAL_COMMUNITY): Payer: Self-pay | Admitting: Sports Medicine

## 2024-09-28 DIAGNOSIS — S7292XA Unspecified fracture of left femur, initial encounter for closed fracture: Secondary | ICD-10-CM | POA: Diagnosis not present

## 2024-09-28 LAB — BASIC METABOLIC PANEL WITH GFR
Anion gap: 9 (ref 5–15)
BUN: 20 mg/dL (ref 8–23)
CO2: 24 mmol/L (ref 22–32)
Calcium: 8.9 mg/dL (ref 8.9–10.3)
Chloride: 104 mmol/L (ref 98–111)
Creatinine, Ser: 0.9 mg/dL (ref 0.44–1.00)
GFR, Estimated: >60 mL/min
Glucose, Bld: 114 mg/dL — ABNORMAL HIGH (ref 70–99)
Potassium: 4.1 mmol/L (ref 3.5–5.1)
Sodium: 137 mmol/L (ref 135–145)

## 2024-09-28 LAB — CBC
HCT: 27.5 % — ABNORMAL LOW (ref 36.0–46.0)
Hemoglobin: 9.3 g/dL — ABNORMAL LOW (ref 12.0–15.0)
MCH: 31.4 pg (ref 26.0–34.0)
MCHC: 33.8 g/dL (ref 30.0–36.0)
MCV: 92.9 fL (ref 80.0–100.0)
Platelets: 189 K/uL (ref 150–400)
RBC: 2.96 MIL/uL — ABNORMAL LOW (ref 3.87–5.11)
RDW: 12.8 % (ref 11.5–15.5)
WBC: 13 K/uL — ABNORMAL HIGH (ref 4.0–10.5)
nRBC: 0 % (ref 0.0–0.2)

## 2024-09-28 NOTE — TOC CAGE-AID Note (Signed)
 Transition of Care [facility]) - CAGE-AID Screening  Patient Details  Name: Brittany Mahoney MRN: [id number] Date of Birth: 1935/04/20  Clinical Narrative:  Patient oriented x1, unable to participate in substance abuse screening at this time.  CAGE-AID Screening: Substance Abuse Screening unable to be completed due to: : Patient unable to participate (patient currently only oriented x1)

## 2024-09-28 NOTE — Progress Notes (Signed)
 Orthopaedic Surgery Progress Note  Got up with PT yesterday. Moderate hip pain  Exam: Left Lower Extremity: Dressings clean dry and intact + ankle dorsiflexion/plantarflexion/EHL SILT SP/DP/T Foot wwp   Assessment: 89 y.o. female 1 Day Post-Op status post intramedullary fixation of left intertrochanteric hip fracture  Plan: Weightbearing as tolerated using a walker Dressing: Leave Aquacel in place until postop visit.  Okay to change with regular dry gauze if needed OK to resume DVT chemoppx or anticoagulation on post op day 1; recommend 4 weeks of chemoppx with ASA 81 BID if no other AC is in place UTI tx per medicine Monitor hgb, trending down, 8.3 today PT  Discharge instructions placed in Epic

## 2024-09-28 NOTE — Progress Notes (Signed)
 Pt voided post catheter removal, unable to measure amount d/t urine soiling the bed. Purwik not working properly. Pt cleaned and new Purwik placed.

## 2024-09-28 NOTE — TOC Initial Note (Signed)
 Transition of Care [facility]) - Initial/Assessment Note    Patient Details  Name: Brittany Mahoney MRN: [id number] Date of Birth: [birth year]  Transition of [facility]) CM/SW Contact:    Jeoffrey LITTIE [name] Phone Number: 09/28/2024, 2:51 PM  Clinical Narrative:                 Pt admitted from home due to mechanical fall. Pt currently Ox1- CSW contacted pt POA, Jama Caldron, who is agreeable to SNF following hospital DC. Jama Caldron stated her first choice would be [facility]. CSW completed Fl2 and sent out SNF referrals. CSW will follow up to provide bed offers.  Expected Discharge Plan: Skilled Nursing Facility Barriers to Discharge: Continued Medical Work up, SNF Pending bed offer   Patient Goals and CMS Choice Patient states their goals for this hospitalization and ongoing recovery are:: SNF          Expected Discharge Plan and Services       Living arrangements for the past 2 months: Single Family Home                                      Prior Living Arrangements/Services Living arrangements for the past 2 months: Single Family Home Lives with:: Self Patient language and need for interpreter reviewed:: Yes Do you feel safe going back to the place where you live?: Yes      Need for Family Participation in Patient Care: Yes (Comment) Care giver support system in place?: Yes (comment)   Criminal Activity/Legal Involvement Pertinent to Current Situation/Hospitalization: No - Comment as needed  Activities of Daily Living   ADL Screening (condition at time of admission) Independently performs ADLs?: No Does the patient have a NEW difficulty with bathing/dressing/toileting/self-feeding that is expected to last >3 days?: Yes (Initiates electronic notice to provider for possible OT consult) Does the patient have a NEW difficulty with getting in/out of bed, walking, or climbing stairs that is expected to last >3 days?: Yes (Initiates electronic notice to provider for possible PT  consult) Does the patient have a NEW difficulty with communication that is expected to last >3 days?: No Is the patient deaf or have difficulty hearing?: Yes Does the patient have difficulty seeing, even when wearing glasses/contacts?: No Does the patient have difficulty concentrating, remembering, or making decisions?: Yes  Permission Sought/Granted Permission sought to share information with : Facility Medical Sales Representative, Family Supports    Share Information with NAME: Jama Caldron  Permission granted to share info w AGENCY: SNFs     Permission granted to share info w Contact Information: [phone]  Emotional Assessment Appearance:: Appears stated age Attitude/Demeanor/Rapport: Unable to Assess Affect (typically observed): Unable to Assess Orientation: : Oriented to Self Alcohol / Substance Use: Not Applicable Psych Involvement: No (comment)  Admission diagnosis:  Epidural hematoma (HCC) [S06.4XAA] Closed left femoral fracture (HCC) [S72.92XA] Closed fracture of left hip, initial encounter (HCC) [S72.002A] Patient Active Problem List   Diagnosis Date Noted   Closed left femoral fracture (HCC) 09/26/2024   Low back pain 08/13/2024   Transaminasemia 06/20/2024   UTI (urinary tract infection) 06/19/2024   B12 deficiency 03/19/2024   Lumbar compression fracture (HCC) 03/10/2024   Current mild episode of major depressive disorder 11/05/2022   Physical deconditioning 11/03/2021   OA (osteoarthritis) of knee 06/23/2021   Osteoporosis    Other abnormalities of gait and mobility    Dementia (  HCC) with other behavioral disturbance    Personal history of (healed) traumatic fracture    Repeated falls    Tremor, unspecified    History of uterine cancer 04/01/2017   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   PCP:  [name], MD Pharmacy:   [facility] [address], [place] - [address] [place] [address] [place] [address] [place] [address] Phone: [phone] Fax:  [phone]     Social Drivers of Health (SDOH) Social History: SDOH Screenings   Food Insecurity: Patient Unable To Answer (09/27/2024)  Housing: Unknown (09/27/2024)  Transportation Needs: Patient Unable To Answer (09/27/2024)  Utilities: Patient Unable To Answer (09/27/2024)  Alcohol Screen: Low Risk (10/23/2023)  Depression (PHQ2-9): Medium Risk (08/13/2024)  Financial Resource Strain: Low Risk (10/23/2023)  Physical Activity: Inactive (10/23/2023)  Social Connections: Patient Unable To Answer (09/27/2024)  Stress: No Stress Concern Present (10/23/2023)  Tobacco Use: Low Risk (09/27/2024)  Health Literacy: Adequate Health Literacy (10/23/2023)   SDOH Interventions:     Readmission Risk Interventions    03/12/2024   11:07 AM  Readmission Risk Prevention Plan  Post Dischage Appt Complete  Medication Screening Complete  Transportation Screening Complete

## 2024-09-28 NOTE — Progress Notes (Signed)
 " PROGRESS NOTE  Brittany Mahoney FMW:[id number] DOB: [birth year] DOA: 09/26/2024 PCP: [name], MD   LOS: 2 days   Brief narrative:  89 y.o. female with past medical history significant for recurrent UTIs, mild dementia, chronic bilateral low back pain without sciatica, presented to the ED after mechanical fall at home.  Vitals were stable.  CBC showed WBC at 13.0.  Hemoglobin of 9.3.  X-ray of the hips showed revealed left acute comminuted and displaced intertrochanteric fracture of the left femur with varus angulation.  UA positive for pyuria. Non-contrast head CT showing small lenticular shaped extra-axial hyperdense hemorrhage in the posterior right parietal region, possibly epidural or subdural measuring 5 mm in thickness without overlying fracture, mass effect, or midline shift..  ED provider discussed the case with neurosurgery recommended conservative management with observation    Assessment/Plan: Principal Problem:   Closed left femoral fracture (HCC)   Closed left femoral neck fracture, post mechanical fall Status post orthopedic surgical repair on 09/27/2024.  Further care as per orthopedics.  Continue PT OT evaluation.  Presumed urinary tract infection, POA Follow urine culture and sensitivity.  Continue IV Rocephin .  Complete 3-day course.  Will discontinue Foley catheter.   Stable small right parietal 4 mm extraaxial hemorrhage  Neurosurgery was consulted during hospitalization and CT head scan was repeated which remained stable so patient was considered stable for orthopedic surgery.  Anticoagulation and antiplatelets on hold.  Hemoglobin of 9.3   Alzheimer dementia Continue delirium precautions.  Fall precautions.  DVT prophylaxis: SCDs Start: 09/26/24 2138   Disposition: PT recommends skilled nursing facility placement.  Status is: Inpatient Remains inpatient appropriate because: Status post surgery, need for rehabitation    Code Status:     Code Status:  Limited: Do not attempt resuscitation (DNR) -DNR-LIMITED -Do Not Intubate/DNI   Family Communication: Spoke with the patient's neighbor at bedside.  Consultants: Orthopedics  Procedures: Orthopedic surgical repair 09/27/2024  Anti-infectives:  Cefazolin   Anti-infectives (From admission, onward)    Start     Dose/Rate Route Frequency Ordered Stop   09/27/24 1515  ceFAZolin  (ANCEF ) IVPB 1 g/50 mL premix  Status:  Discontinued        1 g 100 mL/hr over 30 Minutes Intravenous Every 8 hours 09/27/24 1418 09/28/24 0837   09/27/24 1030  ceFAZolin  (ANCEF ) IVPB 2g/100 mL premix  Status:  Discontinued        2 g 200 mL/hr over 30 Minutes Intravenous On call to O.R. 09/27/24 1021 09/27/24 1411   09/26/24 2359  cefTRIAXone  (ROCEPHIN ) 2 g in sodium chloride  0.9 % 100 mL IVPB        2 g 200 mL/hr over 30 Minutes Intravenous Every 24 hours 09/26/24 2337         Subjective: Today, patient was seen and examined at bedside.  Complains of pain especially on movement of the hip.  Had bowel movement 2 days back.  Objective: Vitals:   09/27/24 2217 09/28/24 0526  BP: 110/63 128/63  Pulse: (!) 104 97  Resp: 16 15  Temp: 98.1 F (36.7 C) 97.9 F (36.6 C)  SpO2: 97% 99%    Intake/Output Summary (Last 24 hours) at 09/28/2024 1157 Last data filed at 09/27/2024 2227 Gross per 24 hour  Intake 1335.57 ml  Output 850 ml  Net 485.57 ml   Filed Weights   09/26/24 1558  Weight: 64.4 kg   Body mass index is 22.92 kg/m.   Physical Exam:  GENERAL: Patient is alert awake  and oriented. Not in obvious distress. HENT: No scleral pallor or icterus. Pupils equally reactive to light. Oral mucosa is moist NECK: is supple, no gross swelling noted. CHEST: Clear to auscultation. No crackles or wheezes.  CVS: S1 and S2 heard, no murmur. Regular rate and rhythm.  ABDOMEN: Soft, non-tender, bowel sounds are present. EXTREMITIES: Left lower extremity with tenderness on palpation of the hip with  dressing. CNS: Cranial nerves are intact. No focal motor deficits. SKIN: warm and dry without rashes.  Dressing in place.  Data Review: I have personally reviewed the following laboratory data and studies,  CBC: Recent Labs  Lab 09/26/24 1903 09/27/24 0524 09/28/24 0417  WBC 15.7* 11.0* 13.0*  NEUTROABS 13.4*  --   --   HGB 13.0 12.2 9.3*  HCT 39.6 35.6* 27.5*  MCV 93.0 91.5 92.9  PLT 263 233 189   Basic Metabolic Panel: Recent Labs  Lab 09/26/24 1903 09/27/24 0524 09/28/24 0417  NA 141 140 137  K 3.8 3.9 4.1  CL 106 107 104  CO2 21* 21* 24  GLUCOSE 136* 123* 114*  BUN 19 21 20   CREATININE 1.00 0.80 0.90  CALCIUM 9.4 9.0 8.9  MG  --  2.0  --   PHOS  --  2.8  --    Liver Function Tests: No results for input(s): AST, ALT, ALKPHOS, BILITOT, PROT, ALBUMIN in the last 168 hours. No results for input(s): LIPASE, AMYLASE in the last 168 hours. No results for input(s): AMMONIA in the last 168 hours. Cardiac Enzymes: Recent Labs  Lab 09/26/24 1903  CKTOTAL 89   BNP (last 3 results) No results for input(s): BNP in the last 8760 hours.  ProBNP (last 3 results) No results for input(s): PROBNP in the last 8760 hours.  CBG: No results for input(s): GLUCAP in the last 168 hours. No results found for this or any previous visit (from the past 240 hours).   Studies: DG FEMUR MIN 2 VIEWS LEFT Result Date: 09/27/2024 CLINICAL DATA:  Elective surgery. EXAM: LEFT FEMUR 2 VIEWS COMPARISON:  Radiograph dated 09/26/2024. FINDINGS: Multiple intraoperative fluoroscopic spot images of the left hip provided. The total fluoroscopic time is 163.5 seconds with a cumulative air Karma of 37.7 mGy. Status post ORIF of left femoral neck fracture. IMPRESSION: Status post ORIF of left femoral neck fracture. Electronically Signed   By: Vanetta Chou M.D.   On: 09/27/2024 17:32   DG C-Arm 1-60 Min-No Report Result Date: 09/27/2024 Fluoroscopy was utilized by the  requesting physician.  No radiographic interpretation.   DG C-Arm 1-60 Min-No Report Result Date: 09/27/2024 Fluoroscopy was utilized by the requesting physician.  No radiographic interpretation.   CT HEAD WO CONTRAST ( ) Result Date: 09/27/2024 EXAM: CT HEAD WITHOUT CONTRAST 09/27/2024 05:09:00 AM TECHNIQUE: CT of the head was performed without the administration of intravenous contrast. Automated exposure control, iterative reconstruction, and/or weight based adjustment of the mA/kV was utilized to reduce the radiation dose to as low as reasonably achievable. COMPARISON: 09/26/2024 CLINICAL HISTORY: Head trauma, minor (Age >= 65y) Head trauma, minor (Age >= 65y) Head trauma, minor (Age >= 65y) FINDINGS: BRAIN AND VENTRICLES: Stable small right parietal 4 mm extraaxial hemorrhage. Prominence of sulci and ventricles compatible with cerebral volume loss. Nonspecific hypoattenuation within periventricular and subcortical white matter, likely representing sequela of chronic microvascular ischemic change. No evidence of acute infarct. No mass effect or midline shift. ORBITS: No acute abnormality. SINUSES: Right mastoid effusion. SOFT TISSUES AND SKULL: No acute soft tissue  abnormality. No skull fracture. IMPRESSION: 1. Stable small right parietal 4 mm extraaxial hemorrhage. 2. Prominence of sulci and ventricles compatible with cerebral volume loss. 3. Nonspecific hypoattenuation within periventricular and subcortical white matter, likely representing sequela of chronic microvascular ischemic change. Electronically signed by: Evalene Coho MD 09/27/2024 05:34 AM EST RP Workstation: [id number]   DG Chest Portable 1 View Result Date: 09/26/2024 EXAM: 1 VIEW(S) XRAY OF THE CHEST 09/26/2024 08:25:54 PM COMPARISON: None available. CLINICAL HISTORY: fall fall fall fall FINDINGS: LUNGS AND PLEURA: No focal pulmonary opacity. No pleural effusion. No pneumothorax. HEART AND MEDIASTINUM: No acute abnormality of the  cardiac and mediastinal silhouettes. BONES AND SOFT TISSUES: No acute osseous abnormality. IMPRESSION: 1. No acute process. Electronically signed by: Greig Pique MD 09/26/2024 08:26 PM EST RP Workstation: [id number]   DG Shoulder Left Result Date: 09/26/2024 EXAM: 1 VIEW(S) XRAY OF THE LEFT SHOULDER 09/26/2024 06:37:08 PM COMPARISON: None available. CLINICAL HISTORY: fall fall fall FINDINGS: BONES AND JOINTS: Glenohumeral joint is normally aligned. No acute fracture. No malalignment. The [facility] joint is unremarkable. SOFT TISSUES: No abnormal calcifications. Visualized lung is unremarkable. IMPRESSION: 1. No evidence of acute injury. Electronically signed by: Greig Pique MD 09/26/2024 06:51 PM EST RP Workstation: [id number]   DG Hip Unilat W or Wo Pelvis 2-3 Views Left Result Date: 09/26/2024 EXAM: 2 or 3 VIEW(S) XRAY OF THE BILATERAL HIP 09/26/2024 06:37:08 PM COMPARISON: 03/10/2024 CLINICAL HISTORY: fall fall fall fall fall FINDINGS: BONES AND JOINTS: Acute comminuted and displaced intertrochanteric fracture of the left femur with varus angulation. Partially visualized intramedullary nail fixation of the right femur in place. Old posttraumatic and postsurgical changes of the right femur. LUMBAR SPINE: Degenerative changes of the lower lumbar spine. SOFT TISSUES: Unremarkable. IMPRESSION: 1. Acute comminuted and displaced intertrochanteric fracture of the left femur with varus angulation. 2. Old posttraumatic and postsurgical changes of the right femur with partially visualized intramedullary nail fixation in place. Electronically signed by: Greig Pique MD 09/26/2024 06:51 PM EST RP Workstation: [id number]   CT Cervical Spine Wo Contrast Result Date: 09/26/2024 EXAM: CT CERVICAL SPINE WITHOUT CONTRAST 09/26/2024 06:16:37 PM TECHNIQUE: CT of the cervical spine was performed without the administration of intravenous contrast. Multiplanar reformatted images are provided for review. Automated exposure control,  iterative reconstruction, and/or weight based adjustment of the mA/kV was utilized to reduce the radiation dose to as low as reasonably achievable. COMPARISON: 06/19/2024 CLINICAL HISTORY: Neck trauma (Age >= 65y) FINDINGS: BONES AND ALIGNMENT: No acute fracture. Slight anterolisthesis of C4 on C5 and retrolisthesis of C6 on C7. DEGENERATIVE CHANGES: Advanced degenerative disc disease from C4-C5 through C6-C7. Advanced bilateral degenerative facet disease diffusely. SOFT TISSUES: No prevertebral soft tissue swelling. IMPRESSION: 1. No evidence of acute traumatic injury. 2. Degenerative disc and facet disease as above. Electronically signed by: Franky Crease MD 09/26/2024 06:27 PM EST RP Workstation: [id number]   CT Head Wo Contrast Result Date: 09/26/2024 EXAM: CT HEAD WITHOUT CONTRAST 09/26/2024 06:16:37 PM TECHNIQUE: CT of the head was performed without the administration of intravenous contrast. Automated exposure control, iterative reconstruction, and/or weight based adjustment of the mA/kV was utilized to reduce the radiation dose to as low as reasonably achievable. COMPARISON: 06/19/2024 CLINICAL HISTORY: Head trauma, minor (Age >= 65y) FINDINGS: BRAIN AND VENTRICLES: Small lenticular-shaped extraaxial hyperdense hemorrhage noted in the posterior right parietal region measuring 5 mm in thickness. This could reflect a small epidural or subdural hemorrhage. No mass effect or midline shift. Atrophy and chronic small vessel disease throughout the deep white  matter. No evidence of acute infarct. No hydrocephalus. ORBITS: No acute abnormality. SINUSES: No acute abnormality. SOFT TISSUES AND SKULL: No acute soft tissue abnormality. No overlying fracture. IMPRESSION: 1. Small lenticular-shaped extraaxial hyperdense hemorrhage in the posterior right parietal region, possibly epidural or subdural, measuring 5 mm in thickness, without overlying fracture, mass effect, or midline shift. 2. These results were called to Dr.  Birdena at the time of interpretation. Electronically signed by: Franky Crease MD 09/26/2024 06:25 PM EST RP Workstation: [id number]      Vernal Alstrom, MD  [facility] 09/28/2024  If 7PM-7AM, please contact night-coverage       "

## 2024-09-28 NOTE — Progress Notes (Signed)
 Mobility Specialist Progress Note:   09/28/24 1201  Mobility  Activity Pivoted/transferred from chair to bed  Level of Assistance Minimal assist, patient does 75% or more (+2)  Assistive Device Other (Comment) (HHA)  Distance Ambulated (ft) 3 ft  LLE Weight Bearing Per Provider Order WBAT  Activity Response Tolerated well  Mobility Referral Yes  Mobility visit 1 Mobility  Mobility Specialist Start Time (ACUTE ONLY) 1149  Mobility Specialist Stop Time (ACUTE ONLY) 1201  Mobility Specialist Time Calculation (min) (ACUTE ONLY) 12 min   Received pt in chair and agreeable to mobility. Pt required MinA +2 STS and pivot to bed. Pt c/o LLE pain, otherwise tolerated well. Left pt in bed with alarm on. Personal belongings and call light within reach. All needs met.  Lavanda Pollack Mobility Specialist  Please contact via [employer] or  Rehab Office [phone]

## 2024-09-28 NOTE — Plan of Care (Signed)
  Problem: Pain Managment: Goal: General experience of comfort will improve and/or be controlled Outcome: Progressing   Problem: Safety: Goal: Ability to remain free from injury will improve Outcome: Progressing   Problem: Skin Integrity: Goal: Risk for impaired skin integrity will decrease Outcome: Progressing

## 2024-09-28 NOTE — Plan of Care (Signed)

## 2024-09-28 NOTE — Evaluation (Signed)
 Physical Therapy Evaluation Patient Details Name: Brittany Mahoney MRN: [id number] DOB: December 23, 1934 Today's Date: 09/28/2024  History of Present Illness  Pt is a 89 y.o. F who presents to [facility] ER after a fall with left intertrochanteric hip fracture now s/p IM fixation 09/27/2024. Significant PMH: uterine cancer, CKD stage 1, closed right hip fx 2017, osteoporosis.  Clinical Impression  Pt admitted with left intertrochanteric hip fx s/p IM fixation. Pt is pleasant and motivated to participate in physical therapy evaluation. PTA, pt is a limited household ambulator using a RW and has neighbors assist with IADL's daily. Pt presents with LLE weakness, pain, impaired standing balance. Pt requiring modA + 2 for bed mobility and pivotal transfers from bed to chair using RW. Gait deferred this session due to pt reporting nausea, but suspect will be able to initiate gait training soon. Patient will benefit from continued inpatient follow up therapy, <3 hours/day to address deficits and maximize functional mobility.       If plan is discharge home, recommend the following: A lot of help with walking and/or transfers;A lot of help with bathing/dressing/bathroom   Can travel by private vehicle   No    Equipment Recommendations None recommended by PT  Recommendations for Other Services       Functional Status Assessment Patient has had a recent decline in their functional status and demonstrates the ability to make significant improvements in function in a reasonable and predictable amount of time.     Precautions / Restrictions Precautions Precautions: Fall Restrictions Weight Bearing Restrictions Per Provider Order: Yes LLE Weight Bearing Per Provider Order: Weight bearing as tolerated      Mobility  Bed Mobility Overal bed mobility: Needs Assistance Bed Mobility: Supine to Sit     Supine to sit: Mod assist, +2 for physical assistance     General bed mobility comments: Pt initiating  well, but slowly, requiring assist to progress LLE off edge of bed, verbal cues for reaching with RUE for rail, use of bed pad to pivot hips to edge    Transfers Overall transfer level: Needs assistance Equipment used: Rolling walker (2 wheels) Transfers: Sit to/from Stand, Bed to chair/wheelchair/BSC Sit to Stand: Mod assist, +2 physical assistance   Step pivot transfers: Mod assist, +2 physical assistance       General transfer comment: ModA + 2 to power up to stand, RUE on walker, LUE pushing up from bed. Limited weightbearing through LLE. Increased time for weight shifting and difficulty moving LLE. Increased trunk/hip flexion    Ambulation/Gait                  Stairs            Wheelchair Mobility     Tilt Bed    Modified Rankin (Stroke Patients Only)       Balance Overall balance assessment: Needs assistance Sitting-balance support: Feet supported Sitting balance-Leahy Scale: Fair     Standing balance support: Bilateral upper extremity supported, Reliant on assistive device for balance, During functional activity Standing balance-Leahy Scale: Poor Standing balance comment: Reliant on RW                             Pertinent Vitals/Pain Pain Assessment Pain Assessment: Faces Faces Pain Scale: Hurts even more Pain Location: L hip Pain Descriptors / Indicators: Operative site guarding, Discomfort, Grimacing Pain Intervention(s): Limited activity within patient's tolerance, Monitored during session, Premedicated before session  Home Living Family/patient expects to be discharged to:: Private residence Living Arrangements: Alone Available Help at Discharge: Neighbor;Available PRN/intermittently Type of Home: House Home Access: Level entry       Home Layout: One level Home Equipment: [employer] (2 wheels);Wheelchair - manual;Grab bars - tub/shower;Shower seat      Prior Function Prior Level of Function : Needs assist              Mobility Comments: spends a lot of time in the [facility] at home, can get around in it all around the house ADLs Comments: pt neighbors check on her frequently, bring breakfast, help wash from waist down     Extremity/Trunk Assessment   Upper Extremity Assessment Upper Extremity Assessment: Defer to OT evaluation    Lower Extremity Assessment Lower Extremity Assessment: Generalized weakness    Cervical / Trunk Assessment Cervical / Trunk Assessment: Kyphotic  Communication   Communication Communication: No apparent difficulties    Cognition Arousal: Alert Behavior During Therapy: WFL for tasks assessed/performed   PT - Cognitive impairments: No family/caregiver present to determine baseline, Memory                       PT - Cognition Comments: Pleasant Following commands: Impaired Following commands impaired: Follows one step commands with increased time     Cueing Cueing Techniques: Verbal cues     General Comments General comments (skin integrity, edema, etc.): HR peak 108    Exercises     Assessment/Plan    PT Assessment Patient needs continued PT services  PT Problem List Decreased strength;Decreased activity tolerance;Decreased mobility;Decreased balance;Pain       PT Treatment Interventions DME instruction;Gait training;Functional mobility training;Therapeutic activities;Therapeutic exercise;Balance training;Patient/family education    PT Goals (Current goals can be found in the Care Plan section)  Acute Rehab PT Goals Patient Stated Goal: To get out of bed PT Goal Formulation: With patient Time For Goal Achievement: 10/12/24 Potential to Achieve Goals: Good    Frequency Min 2X/week     Co-evaluation PT/OT/SLP Co-Evaluation/Treatment: Yes Reason for Co-Treatment: For patient/therapist safety;To address functional/ADL transfers PT goals addressed during session: Mobility/safety with mobility         AM-PAC PT 6 Clicks  Mobility  Outcome Measure Help needed turning from your back to your side while in a flat bed without using bedrails?: A Little Help needed moving from lying on your back to sitting on the side of a flat bed without using bedrails?: A Lot Help needed moving to and from a bed to a chair (including a wheelchair)?: A Lot Help needed standing up from a chair using your arms (e.g., wheelchair or bedside chair)?: A Lot Help needed to walk in hospital room?: Total Help needed climbing 3-5 steps with a railing? : Total 6 Click Score: 11    End of Session Equipment Utilized During Treatment: Gait belt Activity Tolerance: Patient tolerated treatment well Patient left: in chair;with call bell/phone within reach;with chair alarm set Nurse Communication: Mobility status PT Visit Diagnosis: Unsteadiness on feet (R26.81);History of falling (Z91.81);Muscle weakness (generalized) (M62.81);Difficulty in walking, not elsewhere classified (R26.2);Pain Pain - Right/Left: Left Pain - part of body: Hip    Time: 0820-0850 PT Time Calculation (min) (ACUTE ONLY): 30 min   Charges:   PT Evaluation $PT Eval Low Complexity: 1 Low   PT General Charges $$ ACUTE PT VISIT: 1 Visit         Aleck Daring, PT, DPT Acute Rehabilitation Services  Office [phone]   Aleck ONEIDA Daring 09/28/2024, 9:02 AM

## 2024-09-28 NOTE — Evaluation (Signed)
 Occupational Therapy Evaluation Patient Details Name: Brittany Mahoney MRN: [id number] DOB: 09-29-1934 Today's Date: 09/28/2024   History of Present Illness   Pt is a 89 y.o. F who presents to [facility] ER after a fall with left intertrochanteric hip fracture now s/p IM fixation 09/27/2024. Significant PMH: uterine cancer, CKD stage 1, closed right hip fx 2017, osteoporosis.     Clinical Impressions Pt lives alone and reports her neighbors bring her breakfast every morning and assist her with ADLs (lower body) and IADLs. She functions primarily from a w/c level. Pt presents with impaired cognition, post operative pain, generalized weakness and impaired standing balance. She needs +2 assist for all mobility and set up to total assist for ADLs. Patient will benefit from continued inpatient follow up therapy, <3 hours/day.     If plan is discharge home, recommend the following:   Two people to help with walking and/or transfers;A lot of help with bathing/dressing/bathroom;Assistance with cooking/housework;Direct supervision/assist for medications management;Direct supervision/assist for financial management;Assist for transportation;Help with stairs or ramp for entrance     Functional Status Assessment   Patient has had a recent decline in their functional status and demonstrates the ability to make significant improvements in function in a reasonable and predictable amount of time.     Equipment Recommendations   Other (comment) (defer)     Recommendations for Other Services         Precautions/Restrictions   Precautions Precautions: Fall Restrictions Weight Bearing Restrictions Per Provider Order: Yes LLE Weight Bearing Per Provider Order: Weight bearing as tolerated     Mobility Bed Mobility Overal bed mobility: Needs Assistance Bed Mobility: Supine to Sit     Supine to sit: Mod assist, +2 for physical assistance     General bed mobility comments: Pt initiating  well, but slowly, requiring assist to progress LLE off edge of bed, verbal cues for reaching with RUE for rail, use of bed pad to pivot hips to edge    Transfers Overall transfer level: Needs assistance Equipment used: Rolling walker (2 wheels) Transfers: Sit to/from Stand, Bed to chair/wheelchair/BSC Sit to Stand: Mod assist, +2 physical assistance     Step pivot transfers: Mod assist, +2 physical assistance     General transfer comment: assist to rise and steady, pt placing minimal weight through L LE with step pivot to chair, increased time and verbal cues to sequence      Balance Overall balance assessment: Needs assistance Sitting-balance support: Feet supported Sitting balance-Leahy Scale: Fair     Standing balance support: Bilateral upper extremity supported, Reliant on assistive device for balance, During functional activity Standing balance-Leahy Scale: Poor Standing balance comment: Reliant on RW and external assist                           ADL either performed or assessed with clinical judgement   ADL Overall ADL's : Needs assistance/impaired Eating/Feeding: Independent;Bed level   Grooming: Brushing hair;Sitting;Set up   Upper Body Bathing: Minimal assistance;Sitting   Lower Body Bathing: +2 for physical assistance;Total assistance;Sit to/from stand   Upper Body Dressing : Minimal assistance;Sitting   Lower Body Dressing: +2 for physical assistance;Total assistance;Sit to/from stand       Toileting- Architect and Hygiene: +2 for physical assistance;Total assistance;Sit to/from stand               Vision Baseline Vision/History: 1 Wears glasses Ability to See in Adequate Light: 0 Adequate  Patient Visual Report: No change from baseline       Perception         Praxis         Pertinent Vitals/Pain Pain Assessment Pain Assessment: Faces Faces Pain Scale: Hurts even more Pain Location: L hip Pain Descriptors /  Indicators: Operative site guarding, Discomfort, Grimacing Pain Intervention(s): Monitored during session, Repositioned     Extremity/Trunk Assessment Upper Extremity Assessment Upper Extremity Assessment: Generalized weakness;Right hand dominant   Lower Extremity Assessment Lower Extremity Assessment: Defer to PT evaluation   Cervical / Trunk Assessment Cervical / Trunk Assessment: Kyphotic;Other exceptions (weakness)   Communication Communication Communication: No apparent difficulties   Cognition Arousal: Alert Behavior During Therapy: WFL for tasks assessed/performed Cognition: No family/caregiver present to determine baseline             OT - Cognition Comments: poor memory, tangential                 Following commands: Impaired Following commands impaired: Follows one step commands with increased time     Cueing  General Comments   Cueing Techniques: Verbal cues  HR peak 108   Exercises     Shoulder Instructions      Home Living Family/patient expects to be discharged to:: Private residence Living Arrangements: Alone Available Help at Discharge: Neighbor;Available PRN/intermittently Type of Home: House Home Access: Level entry     Home Layout: One level     Bathroom Shower/Tub: Chief Strategy Officer: Standard Bathroom Accessibility: Yes   Home Equipment: Agricultural Consultant (2 wheels);Wheelchair - manual;Grab bars - tub/shower;Shower seat          Prior Functioning/Environment Prior Level of Function : Needs assist             Mobility Comments: spends a lot of time in the [facility] at home, can get around in it all around the house ADLs Comments: pt neighbors check on her frequently, bring breakfast, help wash from waist down    OT Problem List: Decreased strength;Impaired balance (sitting and/or standing);Decreased activity tolerance;Decreased cognition;Decreased knowledge of use of DME or AE;Decreased knowledge of  precautions;Pain   OT Treatment/Interventions: Self-care/ADL training;DME and/or AE instruction;Therapeutic activities;Patient/family education;Balance training      OT Goals(Current goals can be found in the care plan section)   Acute Rehab OT Goals OT Goal Formulation: With patient Time For Goal Achievement: 10/12/24 Potential to Achieve Goals: Fair ADL Goals Pt Will Perform Upper Body Bathing: with supervision;sitting Pt Will Perform Upper Body Dressing: with supervision;sitting Pt Will Transfer to Toilet: with min assist;ambulating;bedside commode Pt Will Perform Toileting - Clothing Manipulation and hygiene: with mod assist;sit to/from stand Additional ADL Goal #1: Pt will complete bed mobility with mod assist in preparation for ADLs.   OT Frequency:  Min 2X/week    Co-evaluation PT/OT/SLP Co-Evaluation/Treatment: Yes Reason for Co-Treatment: For patient/therapist safety;To address functional/ADL transfers PT goals addressed during session: Mobility/safety with mobility OT goals addressed during session: ADL's and self-care      AM-PAC OT 6 Clicks Daily Activity     Outcome Measure Help from another person eating meals?: None Help from another person taking care of personal grooming?: A Little Help from another person toileting, which includes using toliet, bedpan, or urinal?: Total Help from another person bathing (including washing, rinsing, drying)?: A Lot Help from another person to put on and taking off regular upper body clothing?: A Little Help from another person to put on and taking off regular  lower body clothing?: Total 6 Click Score: 14   End of Session Equipment Utilized During Treatment: Gait belt;Rolling walker (2 wheels)  Activity Tolerance: Patient tolerated treatment well Patient left: in chair;with call bell/phone within reach;with chair alarm set  OT Visit Diagnosis: Unsteadiness on feet (R26.81);Other abnormalities of gait and mobility  (R26.89);Pain;Muscle weakness (generalized) (M62.81);Other symptoms and signs involving cognitive function                Time: 0822-0851 OT Time Calculation (min): 29 min Charges:  OT General Charges $OT Visit: 1 Visit OT Evaluation $OT Eval Moderate Complexity: 1 Mod  Mliss HERO, OTR/L Acute Rehabilitation Services Office: [phone]   Kennth Mliss Helling 09/28/2024, 10:54 AM

## 2024-09-28 NOTE — Progress Notes (Signed)
 Foley catheter has been removed per order, tolerated well. Pt states she is incontinent and wears briefs at home, pt informed we do not have briefs here on this floor but that her family if able could bring them from home. MD made aware of incontinence and request for external catheter.

## 2024-09-28 NOTE — NC FL2 (Signed)
 " [place]  MEDICAID FL2 LEVEL OF CARE FORM     IDENTIFICATION  Patient Name: Brittany Mahoney Birthdate: May 12, 1935 Sex: female Admission Date (Current Location): 09/26/2024  [facility] and [place] Number:  [employer] and Address:  [facility] [place], [address], [place], [place] [address]      Provider Number: [id number]  Attending Physician Name and Address:  Sonjia Held, MD  Relative Name and Phone Number:  Jama Jenkins Sayer [facility])  [phone]    Current Level of Care: Hospital Recommended Level of Care: Skilled Nursing Facility Prior Approval Number:    Date Approved/Denied:   PASRR Number: [id number]  Discharge Plan: SNF    Current Diagnoses: Patient Active Problem List   Diagnosis Date Noted   Closed left femoral fracture (HCC) 09/26/2024   Low back pain 08/13/2024   Transaminasemia 06/20/2024   UTI (urinary tract infection) 06/19/2024   B12 deficiency 03/19/2024   Lumbar compression fracture (HCC) 03/10/2024   Current mild episode of major depressive disorder 11/05/2022   Physical deconditioning 11/03/2021   OA (osteoarthritis) of knee 06/23/2021   Osteoporosis    Other abnormalities of gait and mobility    Dementia (HCC) with other behavioral disturbance    Personal history of (healed) traumatic fracture    Repeated falls    Tremor, unspecified    History of uterine cancer 04/01/2017   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    Orientation RESPIRATION BLADDER Height & Weight     Self  Normal External catheter Weight: 142 lb (64.4 kg) Height:  5' 6 (167.6 cm)  BEHAVIORAL SYMPTOMS/MOOD NEUROLOGICAL BOWEL NUTRITION STATUS      Continent Diet (See DC Summary)  AMBULATORY STATUS COMMUNICATION OF NEEDS Skin   Extensive Assist Verbally Other (Comment) (closed surgicla incision on hip)                       Personal Care Assistance Level of Assistance  Bathing, Feeding, Dressing Bathing Assistance: Maximum assistance Feeding  assistance: Limited assistance Dressing Assistance: Maximum assistance     Functional Limitations Info  Sight, Hearing, Speech Sight Info: Impaired Hearing Info: Impaired Speech Info: Adequate    SPECIAL CARE FACTORS FREQUENCY  PT (By licensed PT), OT (By licensed OT)     PT Frequency: 5x.week OT Frequency: 5x/week            Contractures Contractures Info: Not present    Additional Factors Info  Code Status, Allergies Code Status Info: DNR-Limited Allergies Info: Penicillins; Codeine; Erythromycin; Morphine  & Codeine           Current Medications (09/28/2024):  This is the current hospital active medication list Current Facility-Administered Medications  Medication Dose Route Frequency Provider Last Rate Last Admin   acetaminophen  (TYLENOL ) tablet 650 mg  650 mg Oral Q6H PRN Germaine Redbird, MD       cefTRIAXone  (ROCEPHIN ) 2 g in sodium chloride  0.9 % 100 mL IVPB  2 g Intravenous Q24H Germaine Redbird, MD 200 mL/hr at 09/28/24 0058 2 g at 09/28/24 0058   Chlorhexidine  Gluconate Cloth 2 % PADS 6 each  6 each Topical Daily Johnson, Clanford L, MD   6 each at 09/28/24 1043   feeding supplement (ENSURE PLUS HIGH PROTEIN) liquid 237 mL  237 mL Oral BID BM Mdala-Gausi, Masiku Agatha, MD   237 mL at 09/28/24 1324   hydrALAZINE  (APRESOLINE ) injection 10 mg  10 mg Intravenous Q4H PRN Germaine Redbird, MD   10 mg at  09/26/24 2105   HYDROmorphone  (DILAUDID ) injection 0.5 mg  0.5 mg Intravenous Q4H PRN Germaine Redbird, MD   0.5 mg at 09/27/24 9062   Oral care mouth rinse  15 mL Mouth Rinse PRN Johnson, Clanford L, MD       oxyCODONE  (Oxy IR/ROXICODONE ) immediate release tablet 5-10 mg  5-10 mg Oral Q4H PRN Germaine Redbird, MD   5 mg at 09/28/24 1401   polyethylene glycol (MIRALAX  / GLYCOLAX ) packet 17 g  17 g Oral Daily PRN Germaine Redbird, MD       prochlorperazine  (COMPAZINE ) injection 5 mg  5 mg Intravenous Q6H PRN Germaine Redbird, MD         Discharge Medications: Please see discharge  summary for a list of discharge medications.  Relevant Imaging Results:  Relevant Lab Results:   Additional Information SSN: [id number]  Jeoffrey LITTIE Moose, LCSWA     "

## 2024-09-29 DIAGNOSIS — S7292XA Unspecified fracture of left femur, initial encounter for closed fracture: Secondary | ICD-10-CM | POA: Diagnosis not present

## 2024-09-29 LAB — BASIC METABOLIC PANEL WITH GFR
Anion gap: 8 (ref 5–15)
BUN: 19 mg/dL (ref 8–23)
CO2: 25 mmol/L (ref 22–32)
Calcium: 8.6 mg/dL — ABNORMAL LOW (ref 8.9–10.3)
Chloride: 105 mmol/L (ref 98–111)
Creatinine, Ser: 0.87 mg/dL (ref 0.44–1.00)
GFR, Estimated: >60 mL/min
Glucose, Bld: 107 mg/dL — ABNORMAL HIGH (ref 70–99)
Potassium: 3.9 mmol/L (ref 3.5–5.1)
Sodium: 138 mmol/L (ref 135–145)

## 2024-09-29 LAB — CBC
HCT: 25 % — ABNORMAL LOW (ref 36.0–46.0)
Hemoglobin: 8.3 g/dL — ABNORMAL LOW (ref 12.0–15.0)
MCH: 31 pg (ref 26.0–34.0)
MCHC: 33.2 g/dL (ref 30.0–36.0)
MCV: 93.3 fL (ref 80.0–100.0)
Platelets: 180 K/uL (ref 150–400)
RBC: 2.68 MIL/uL — ABNORMAL LOW (ref 3.87–5.11)
RDW: 12.8 % (ref 11.5–15.5)
WBC: 11 K/uL — ABNORMAL HIGH (ref 4.0–10.5)
nRBC: 0 % (ref 0.0–0.2)

## 2024-09-29 NOTE — Plan of Care (Signed)
  Problem: Clinical Measurements: Goal: Ability to maintain clinical measurements within normal limits will improve Outcome: Progressing Goal: Respiratory complications will improve Outcome: Progressing   Problem: Activity: Goal: Risk for activity intolerance will decrease Outcome: Progressing   Problem: Elimination: Goal: Will not experience complications related to urinary retention Outcome: Progressing   Problem: Safety: Goal: Ability to remain free from injury will improve Outcome: Progressing   

## 2024-09-29 NOTE — Progress Notes (Signed)
 Physical Therapy Treatment Patient Details Name: Brittany Mahoney MRN: [id number] DOB: 09/01/1935 Today's Date: 09/29/2024   History of Present Illness Pt is a 89 y.o. F who presents to [facility] ER after a fall with left intertrochanteric hip fracture now s/p IM fixation 09/27/2024. Significant PMH: uterine cancer, CKD stage 1, closed right hip fx 2017, osteoporosis.    PT Comments  Pt premedicated prior to session. Overall, she is progressing appropriately. Pt performed warm up exercises for LLE prior to transitioning to edge of bed. Requiring modA for transfers and ambulating ~5 ft with RW and slowed step to pattern with multimodal cueing and chair follow. Fatigues quickly and pain remains limitation. Patient will benefit from continued inpatient follow up therapy, <3 hours/day.    If plan is discharge home, recommend the following: A lot of help with walking and/or transfers;A lot of help with bathing/dressing/bathroom   Can travel by private vehicle     No  Equipment Recommendations  None recommended by PT    Recommendations for Other Services       Precautions / Restrictions Precautions Precautions: Fall Restrictions Weight Bearing Restrictions Per Provider Order: Yes LLE Weight Bearing Per Provider Order: Weight bearing as tolerated     Mobility  Bed Mobility Overal bed mobility: Needs Assistance Bed Mobility: Supine to Sit     Supine to sit: Max assist, +2 for safety/equipment     General bed mobility comments: Decreased initiation today due to pain, ultimately requiring assist to bring BLE's out of bed and trunk elevation to sit up.    Transfers Overall transfer level: Needs assistance Equipment used: Rolling walker (2 wheels) Transfers: Sit to/from Stand, Bed to chair/wheelchair/BSC Sit to Stand: Mod assist, +2 safety/equipment                Ambulation/Gait Ambulation/Gait assistance: Min assist, +2 safety/equipment Gait Distance (Feet): 5  Feet Assistive device: Rolling walker (2 wheels) Gait Pattern/deviations: Step-to pattern, Decreased stance time - left, Decreased dorsiflexion - left, Decreased weight shift to left Gait velocity: decreased Gait velocity interpretation: <1.31 ft/sec, indicative of household ambulator   General Gait Details: Max multimodal cues for sequencing/technique and stepping initiation, utilized visual cue of step to my foot, with LLE, slowed step to pattern with heavy reliance on arms   Stairs             Wheelchair Mobility     Tilt Bed    Modified Rankin (Stroke Patients Only)       Balance Overall balance assessment: Needs assistance Sitting-balance support: Feet supported Sitting balance-Leahy Scale: Fair     Standing balance support: Bilateral upper extremity supported, Reliant on assistive device for balance, During functional activity Standing balance-Leahy Scale: Poor Standing balance comment: Reliant on RW                            Communication Communication Communication: No apparent difficulties  Cognition Arousal: Alert Behavior During Therapy: WFL for tasks assessed/performed   PT - Cognitive impairments: No family/caregiver present to determine baseline, Memory                       PT - Cognition Comments: Pleasant Following commands: Impaired Following commands impaired: Follows one step commands with increased time    Cueing Cueing Techniques: Verbal cues  Exercises General Exercises - Lower Extremity Quad Sets: AROM, Left, 5 reps, Supine Heel Slides: AAROM, Left, 5 reps, Supine  Hip ABduction/ADduction: AAROM, Left, 5 reps, Supine    General Comments        Pertinent Vitals/Pain Pain Assessment Pain Assessment: Faces Faces Pain Scale: Hurts whole lot Pain Location: L hip Pain Descriptors / Indicators: Operative site guarding, Discomfort, Grimacing Pain Intervention(s): Limited activity within patient's tolerance,  Monitored during session, Premedicated before session    Home Living                          Prior Function            PT Goals (current goals can now be found in the care plan section) Acute Rehab PT Goals Patient Stated Goal: To get out of bed PT Goal Formulation: With patient Time For Goal Achievement: 10/12/24 Potential to Achieve Goals: Good Progress towards PT goals: Progressing toward goals    Frequency    Min 2X/week      PT Plan      Co-evaluation              AM-PAC PT 6 Clicks Mobility   Outcome Measure  Help needed turning from your back to your side while in a flat bed without using bedrails?: A Lot Help needed moving from lying on your back to sitting on the side of a flat bed without using bedrails?: A Lot Help needed moving to and from a bed to a chair (including a wheelchair)?: A Lot Help needed standing up from a chair using your arms (e.g., wheelchair or bedside chair)?: A Lot Help needed to walk in hospital room?: Total Help needed climbing 3-5 steps with a railing? : Total 6 Click Score: 10    End of Session Equipment Utilized During Treatment: Gait belt Activity Tolerance: Patient tolerated treatment well Patient left: in chair;with call bell/phone within reach;with chair alarm set Nurse Communication: Mobility status PT Visit Diagnosis: Unsteadiness on feet (R26.81);History of falling (Z91.81);Muscle weakness (generalized) (M62.81);Difficulty in walking, not elsewhere classified (R26.2);Pain Pain - Right/Left: Left Pain - part of body: Hip     Time: 9092-9073 PT Time Calculation (min) (ACUTE ONLY): 19 min  Charges:    $Therapeutic Activity: 8-22 mins PT General Charges $$ ACUTE PT VISIT: 1 Visit                     Brittany Mahoney, PT, DPT Acute Rehabilitation Services Office [phone]    Brittany ONEIDA Mahoney 09/29/2024, 10:11 AM

## 2024-09-29 NOTE — Plan of Care (Signed)
  Problem: Pain Managment: Goal: General experience of comfort will improve and/or be controlled Outcome: Progressing   Problem: Safety: Goal: Ability to remain free from injury will improve Outcome: Progressing   Problem: Skin Integrity: Goal: Risk for impaired skin integrity will decrease Outcome: Progressing

## 2024-09-29 NOTE — TOC Progression Note (Addendum)
 Transition of Care [facility]) - Progression Note    Patient Details  Name: Brittany Mahoney MRN: [id number] Date of Birth: [birth year]  Transition of Care [facility]) CM/SW Contact  Avina Eberle LITTIE Moose, [place] Phone Number: 09/29/2024, 9:14 AM  Clinical Narrative:    CSW contacted pt POA, Jama Caldron, to provide SNF bed offers but did not receive a response. CSW left a voicemail and provided contact information. CSW will continue to follow.  12:29 PM- CSW spoke with pt POA, Jama Caldron, over the phone. Jama Caldron stated she would like to pursue [facility]. CSW confirmed bed availability with facility, pt can admit following hospital DC. CSW will continue to follow.   Expected Discharge Plan: Skilled Nursing Facility Barriers to Discharge: Continued Medical Work up, SNF Pending bed offer               Expected Discharge Plan and Services       Living arrangements for the past 2 months: Single Family Home                                       Social Drivers of Health (SDOH) Interventions SDOH Screenings   Food Insecurity: Patient Unable To Answer (09/27/2024)  Housing: Unknown (09/27/2024)  Transportation Needs: Patient Unable To Answer (09/27/2024)  Utilities: Patient Unable To Answer (09/27/2024)  Alcohol Screen: Low Risk (10/23/2023)  Depression (PHQ2-9): Medium Risk (08/13/2024)  Financial Resource Strain: Low Risk (10/23/2023)  Physical Activity: Inactive (10/23/2023)  Social Connections: Patient Unable To Answer (09/27/2024)  Stress: No Stress Concern Present (10/23/2023)  Tobacco Use: Low Risk (09/27/2024)  Health Literacy: Adequate Health Literacy (10/23/2023)    Readmission Risk Interventions    03/12/2024   11:07 AM  Readmission Risk Prevention Plan  Post Dischage Appt Complete  Medication Screening Complete  Transportation Screening Complete

## 2024-09-29 NOTE — Progress Notes (Signed)
 " PROGRESS NOTE  Brittany Mahoney FMW:[id number] DOB: Oct 04, 1934 DOA: 09/26/2024 PCP: [name], MD   LOS: 3 days   Brief narrative:  89 y.o. female with past medical history significant for recurrent UTIs, mild dementia, chronic bilateral low back pain without sciatica, presented to the ED after mechanical fall at home.  Vitals were stable.  CBC showed WBC at 13.0.  Hemoglobin of 9.3.  X-ray of the hips showed revealed left acute comminuted and displaced intertrochanteric fracture of the left femur with varus angulation.  UA positive for pyuria. Non-contrast head CT showing small lenticular shaped extra-axial hyperdense hemorrhage in the posterior right parietal region, possibly epidural or subdural measuring 5 mm in thickness without overlying fracture, mass effect, or midline shift..  ED provider discussed the case with neurosurgery recommended conservative management with observation    Assessment/Plan: Principal Problem:   Closed left femoral fracture (HCC)   Closed left femoral neck fracture, post mechanical fall Status post orthopedic surgical repair on 09/27/2024.  Further care as per orthopedics.  Continue PT OT evaluation.  Patient is weightbearing as tolerated plan for skilled nursing facility placement at this time.  Presumed urinary tract infection, POA Completed 3-day course of Rocephin .  Off Foley catheter.   Stable small right parietal 4 mm extraaxial hemorrhage  Neurosurgery was consulted during hospitalization and CT head scan was repeated which remained stable so patient was considered stable for orthopedic surgery.  Anticoagulation and antiplatelets on hold.  Hemoglobin of 8.3 from 9.3   Alzheimer dementia Continue delirium precautions.  Fall precautions.  DVT prophylaxis: SCDs Start: 09/26/24 2138   Disposition: PT recommends skilled nursing facility placement.  Status is: Inpatient Remains inpatient appropriate because: Status post surgery, need for  rehabitation    Code Status:     Code Status: Limited: Do not attempt resuscitation (DNR) -DNR-LIMITED -Do Not Intubate/DNI   Family Communication: Spoke with the patient's neighbor on 09/28/2024  Consultants: Orthopedics  Procedures: Orthopedic surgical repair 09/27/2024  Anti-infectives:  Cefazolin  IV  Anti-infectives (From admission, onward)    Start     Dose/Rate Route Frequency Ordered Stop   09/27/24 1515  ceFAZolin  (ANCEF ) IVPB 1 g/50 mL premix  Status:  Discontinued        1 g 100 mL/hr over 30 Minutes Intravenous Every 8 hours 09/27/24 1418 09/28/24 0837   09/27/24 1030  ceFAZolin  (ANCEF ) IVPB 2g/100 mL premix  Status:  Discontinued        2 g 200 mL/hr over 30 Minutes Intravenous On call to O.R. 09/27/24 1021 09/27/24 1411   09/26/24 2359  cefTRIAXone  (ROCEPHIN ) 2 g in sodium chloride  0.9 % 100 mL IVPB        2 g 200 mL/hr over 30 Minutes Intravenous Every 24 hours 09/26/24 2337         Subjective: Today, patient was seen and examined at bedside.  Patient complains of pain while movement of the hip.  Has had bowel movements today  Objective: Vitals:   09/29/24 0552 09/29/24 1001  BP: (!) 114/56 (!) 111/46  Pulse: (!) 108 (!) 102  Resp: 17   Temp: 98 F (36.7 C) 98.1 F (36.7 C)  SpO2: 100% 99%    Intake/Output Summary (Last 24 hours) at 09/29/2024 1112 Last data filed at 09/29/2024 1000 Gross per 24 hour  Intake 960 ml  Output 400 ml  Net 560 ml   Filed Weights   09/26/24 1558  Weight: 64.4 kg   Body mass index is 22.92  kg/m.   Physical Exam:  GENERAL: Patient is alert awake and oriented. Not in obvious distress. HENT: No scleral pallor or icterus. Pupils equally reactive to light. Oral mucosa is moist NECK: is supple, no gross swelling noted. CHEST: Clear to auscultation. No crackles or wheezes.  CVS: S1 and S2 heard, no murmur. Regular rate and rhythm.  ABDOMEN: Soft, non-tender, bowel sounds are present. EXTREMITIES: Surgical site  tenderness with dressing. CNS: Cranial nerves are intact. No focal motor deficits. SKIN: warm and dry without rashes.  Dressing in place.  Data Review: I have personally reviewed the following laboratory data and studies,  CBC: Recent Labs  Lab 09/26/24 1903 09/27/24 0524 09/28/24 0417 09/29/24 0443  WBC 15.7* 11.0* 13.0* 11.0*  NEUTROABS 13.4*  --   --   --   HGB 13.0 12.2 9.3* 8.3*  HCT 39.6 35.6* 27.5* 25.0*  MCV 93.0 91.5 92.9 93.3  PLT 263 233 189 180   Basic Metabolic Panel: Recent Labs  Lab 09/26/24 1903 09/27/24 0524 09/28/24 0417 09/29/24 0443  NA 141 140 137 138  K 3.8 3.9 4.1 3.9  CL 106 107 104 105  CO2 21* 21* 24 25  GLUCOSE 136* 123* 114* 107*  BUN 19 21 20 19   CREATININE 1.00 0.80 0.90 0.87  CALCIUM 9.4 9.0 8.9 8.6*  MG  --  2.0  --   --   PHOS  --  2.8  --   --    Liver Function Tests: No results for input(s): AST, ALT, ALKPHOS, BILITOT, PROT, ALBUMIN in the last 168 hours. No results for input(s): LIPASE, AMYLASE in the last 168 hours. No results for input(s): AMMONIA in the last 168 hours. Cardiac Enzymes: Recent Labs  Lab 09/26/24 1903  CKTOTAL 89   BNP (last 3 results) No results for input(s): BNP in the last 8760 hours.  ProBNP (last 3 results) No results for input(s): PROBNP in the last 8760 hours.  CBG: No results for input(s): GLUCAP in the last 168 hours. No results found for this or any previous visit (from the past 240 hours).   Studies: DG FEMUR MIN 2 VIEWS LEFT Result Date: 09/27/2024 CLINICAL DATA:  Elective surgery. EXAM: LEFT FEMUR 2 VIEWS COMPARISON:  Radiograph dated 09/26/2024. FINDINGS: Multiple intraoperative fluoroscopic spot images of the left hip provided. The total fluoroscopic time is 163.5 seconds with a cumulative air Karma of 37.7 mGy. Status post ORIF of left femoral neck fracture. IMPRESSION: Status post ORIF of left femoral neck fracture. Electronically Signed   By: Vanetta Chou  M.D.   On: 09/27/2024 17:32   DG C-Arm 1-60 Min-No Report Result Date: 09/27/2024 Fluoroscopy was utilized by the requesting physician.  No radiographic interpretation.   DG C-Arm 1-60 Min-No Report Result Date: 09/27/2024 Fluoroscopy was utilized by the requesting physician.  No radiographic interpretation.      Evadne Ose, MD  [facility] 09/29/2024  If 7PM-7AM, please contact night-coverage       "

## 2024-09-30 ENCOUNTER — Telehealth: Payer: Self-pay | Admitting: Internal Medicine

## 2024-09-30 DIAGNOSIS — S7292XA Unspecified fracture of left femur, initial encounter for closed fracture: Secondary | ICD-10-CM | POA: Diagnosis not present

## 2024-09-30 LAB — BASIC METABOLIC PANEL WITH GFR
Anion gap: 7 (ref 5–15)
BUN: 14 mg/dL (ref 8–23)
CO2: 27 mmol/L (ref 22–32)
Calcium: 8.4 mg/dL — ABNORMAL LOW (ref 8.9–10.3)
Chloride: 104 mmol/L (ref 98–111)
Creatinine, Ser: 0.68 mg/dL (ref 0.44–1.00)
GFR, Estimated: >60 mL/min
Glucose, Bld: 101 mg/dL — ABNORMAL HIGH (ref 70–99)
Potassium: 3.8 mmol/L (ref 3.5–5.1)
Sodium: 138 mmol/L (ref 135–145)

## 2024-09-30 LAB — CBC
HCT: 22.6 % — ABNORMAL LOW (ref 36.0–46.0)
Hemoglobin: 7.7 g/dL — ABNORMAL LOW (ref 12.0–15.0)
MCH: 31.8 pg (ref 26.0–34.0)
MCHC: 34.1 g/dL (ref 30.0–36.0)
MCV: 93.4 fL (ref 80.0–100.0)
Platelets: 173 K/uL (ref 150–400)
RBC: 2.42 MIL/uL — ABNORMAL LOW (ref 3.87–5.11)
RDW: 12.6 % (ref 11.5–15.5)
WBC: 8.9 K/uL (ref 4.0–10.5)
nRBC: 0 % (ref 0.0–0.2)

## 2024-09-30 MED ORDER — OXYCODONE HCL 5 MG PO TABS: 5.0000 mg | ORAL_TABLET | ORAL | 0 refills | Status: DC | PRN

## 2024-09-30 MED ORDER — ASPIRIN 81 MG PO TBEC: 81.0000 mg | DELAYED_RELEASE_TABLET | Freq: Two times a day (BID) | ORAL | Status: AC

## 2024-09-30 MED ORDER — DOCUSATE SODIUM 100 MG PO CAPS
100.0000 mg | ORAL_CAPSULE | Freq: Two times a day (BID) | ORAL | Status: DC
  Administered 2024-09-30: 100 mg via ORAL
  Filled 2024-09-30: qty 1

## 2024-09-30 MED ORDER — ASPIRIN 81 MG PO TBEC: 81.0000 mg | DELAYED_RELEASE_TABLET | Freq: Two times a day (BID) | ORAL | Status: DC

## 2024-09-30 MED ORDER — DOCUSATE SODIUM 100 MG PO CAPS: 100.0000 mg | ORAL_CAPSULE | Freq: Two times a day (BID) | ORAL | Status: DC

## 2024-09-30 MED ORDER — POLYETHYLENE GLYCOL 3350 17 G PO PACK: 17.0000 g | PACK | Freq: Every day | ORAL | Status: DC | PRN

## 2024-09-30 MED ORDER — ENSURE PLUS HIGH PROTEIN PO LIQD: 237.0000 mL | Freq: Two times a day (BID) | ORAL | Status: AC

## 2024-09-30 MED ORDER — POLYETHYLENE GLYCOL 3350 17 G PO PACK: 17.0000 g | PACK | Freq: Every day | ORAL | Status: AC | PRN

## 2024-09-30 NOTE — Progress Notes (Signed)
 Report called to [facility] at [facility]

## 2024-09-30 NOTE — Progress Notes (Signed)
 " PROGRESS NOTE  Brittany Mahoney FMW:[id number] DOB: [birth year] DOA: 09/26/2024 PCP: [name], MD   LOS: 4 days   Brief narrative:  89 y.o. female with past medical history significant for recurrent UTIs, mild dementia, chronic bilateral low back pain without sciatica, presented to the ED after mechanical fall at home.  Vitals were stable.  CBC showed WBC at 13.0.  Hemoglobin of 9.3.  X-ray of the hips showed revealed left acute comminuted and displaced intertrochanteric fracture of the left femur with varus angulation.  UA positive for pyuria. Non-contrast head CT showing small lenticular shaped extra-axial hyperdense hemorrhage in the posterior right parietal region, possibly epidural or subdural measuring 5 mm in thickness without overlying fracture, mass effect, or midline shift..  ED provider discussed the case with neurosurgery recommended conservative management with observation    Assessment/Plan: Principal Problem:   Closed left femoral fracture (HCC)   Closed left femoral neck fracture, post mechanical fall Status post  IM nailing of left intertrochanteric hip fracture on 09/27/2024.  Further care as per orthopedics.  Continue PT OT evaluation.  Patient is weightbearing as tolerated plan for skilled nursing facility placement at this time.  Still complains of pain in the surgical site.  Presumed urinary tract infection, POA Completed 3-day course of Rocephin .  Off Foley catheter.   Stable small right parietal 4 mm extraaxial hemorrhage  Neurosurgery was consulted during hospitalization and CT head scan was repeated which remained stable so patient was considered stable for orthopedic surgery.  Anticoagulation and antiplatelets on hold.  Hemoglobin of 7.7 from initial  9.3.  Denies any headache nausea vomit   Alzheimer dementia Continue delirium precautions.  Fall precautions.  DVT prophylaxis: SCDs Start: 09/26/24 2138   Disposition: PT recommends skilled nursing facility  placement.  Patient is medically stable for disposition.  Status is: Inpatient Remains inpatient appropriate because: Status post surgery, need for rehabitation    Code Status:     Code Status: Limited: Do not attempt resuscitation (DNR) -DNR-LIMITED -Do Not Intubate/DNI   Family Communication: Spoke with the patient's neighbor on 09/28/2024  Consultants: Orthopedics  Procedures: IM nailing of left intertrochanteric hip fracture on 09/27/2024.   Anti-infectives:  None currently  Subjective: Today, patient was seen and examined at bedside.  Patient complains of mild pain over the left hip especially on movement.  Denies shortness of breath dyspnea chest pain fever chills or rigor.    Objective: Vitals:   09/29/24 1932 09/30/24 0451  BP: (!) 113/54 (!) 119/57  Pulse: (!) 106 98  Resp: 17   Temp: 98.1 F (36.7 C) 98 F (36.7 C)  SpO2: 99% 95%    Intake/Output Summary (Last 24 hours) at 09/30/2024 1007 Last data filed at 09/30/2024 0755 Gross per 24 hour  Intake 480 ml  Output 500 ml  Net -20 ml   Filed Weights   09/26/24 1558  Weight: 64.4 kg   Body mass index is 22.92 kg/m.   Physical Exam:  General:  Average built, not in obvious distress, Communicative, elderly female, HENT:   No scleral pallor or icterus noted. Oral mucosa is moist.  Chest:  Clear breath sounds.  . No crackles or wheezes.  CVS: S1 &S2 heard. No murmur.  Regular rate and rhythm. Abdomen: Soft, nontender, nondistended.  Bowel sounds are heard.   Extremities: No cyanosis, clubbing or edema.  Peripheral pulses are palpable.  Surgical dressing over the left hip with some tenderness on palpation. Psych: Alert, awake and oriented,  normal mood CNS:  No cranial nerve deficits.  Moves extremities Skin: Warm and dry.  Dressing in place   Data Review: I have personally reviewed the following laboratory data and studies,  CBC: Recent Labs  Lab 09/26/24 1903 09/27/24 0524 09/28/24 0417  09/29/24 0443 09/30/24 0554  WBC 15.7* 11.0* 13.0* 11.0* 8.9  NEUTROABS 13.4*  --   --   --   --   HGB 13.0 12.2 9.3* 8.3* 7.7*  HCT 39.6 35.6* 27.5* 25.0* 22.6*  MCV 93.0 91.5 92.9 93.3 93.4  PLT 263 233 189 180 173   Basic Metabolic Panel: Recent Labs  Lab 09/26/24 1903 09/27/24 0524 09/28/24 0417 09/29/24 0443 09/30/24 0554  NA 141 140 137 138 138  K 3.8 3.9 4.1 3.9 3.8  CL 106 107 104 105 104  CO2 21* 21* 24 25 27   GLUCOSE 136* 123* 114* 107* 101*  BUN 19 21 20 19 14   CREATININE 1.00 0.80 0.90 0.87 0.68  CALCIUM 9.4 9.0 8.9 8.6* 8.4*  MG  --  2.0  --   --   --   PHOS  --  2.8  --   --   --    Liver Function Tests: No results for input(s): AST, ALT, ALKPHOS, BILITOT, PROT, ALBUMIN in the last 168 hours. No results for input(s): LIPASE, AMYLASE in the last 168 hours. No results for input(s): AMMONIA in the last 168 hours. Cardiac Enzymes: Recent Labs  Lab 09/26/24 1903  CKTOTAL 89   BNP (last 3 results) No results for input(s): BNP in the last 8760 hours.  ProBNP (last 3 results) No results for input(s): PROBNP in the last 8760 hours.  CBG: No results for input(s): GLUCAP in the last 168 hours. No results found for this or any previous visit (from the past 240 hours).   Studies: No results found.     Manraj Yeo, MD  [facility] 09/30/2024  If 7PM-7AM, please contact night-coverage       "

## 2024-09-30 NOTE — Telephone Encounter (Signed)
 Copied from CRM #[id number]. Topic: General - Other >> Sep 30, 2024  8:27 AM Eva FALCON wrote: Reason for CRM: Kate with [facility] home [facility] was calling in to report that patient is going to miss PT this week. She is currently admitted into the hospital with a hip fracture due to a fall. Any other questions please reach Kate at [phone].

## 2024-09-30 NOTE — Plan of Care (Signed)
" °  Problem: Education: Goal: Knowledge of General Education information will improve Description: Including pain rating scale, medication(s)/side effects and non-pharmacologic comfort measures Outcome: Adequate for Discharge   Problem: Health Behavior/Discharge Planning: Goal: Ability to manage health-related needs will improve Outcome: Adequate for Discharge   Problem: Clinical Measurements: Goal: Ability to maintain clinical measurements within normal limits will improve Outcome: Adequate for Discharge Goal: Will remain free from infection Outcome: Adequate for Discharge Goal: Diagnostic test results will improve Outcome: Adequate for Discharge Goal: Respiratory complications will improve Outcome: Adequate for Discharge Goal: Cardiovascular complication will be avoided Outcome: Adequate for Discharge   Problem: Activity: Goal: Risk for activity intolerance will decrease Outcome: Adequate for Discharge   Problem: Nutrition: Goal: Adequate nutrition will be maintained Outcome: Adequate for Discharge   Problem: Coping: Goal: Level of anxiety will decrease Outcome: Adequate for Discharge   Problem: Elimination: Goal: Will not experience complications related to bowel motility Outcome: Adequate for Discharge Goal: Will not experience complications related to urinary retention Outcome: Adequate for Discharge   Problem: Pain Managment: Goal: General experience of comfort will improve and/or be controlled Outcome: Adequate for Discharge   Problem: Safety: Goal: Ability to remain free from injury will improve Outcome: Adequate for Discharge   Problem: Skin Integrity: Goal: Risk for impaired skin integrity will decrease Outcome: Adequate for Discharge   Problem: Education: Goal: Knowledge of the prescribed therapeutic regimen will improve Outcome: Adequate for Discharge   Problem: Bowel/Gastric: Goal: Gastrointestinal status for postoperative course will  improve Outcome: Adequate for Discharge   Problem: Cardiac: Goal: Ability to maintain an adequate cardiac output Outcome: Adequate for Discharge Goal: Will show no evidence of cardiac arrhythmias Outcome: Adequate for Discharge   Problem: Nutritional: Goal: Will attain and maintain optimal nutritional status Outcome: Adequate for Discharge   Problem: Neurological: Goal: Will regain or maintain usual level of consciousness Outcome: Adequate for Discharge   Problem: Clinical Measurements: Goal: Ability to maintain clinical measurements within normal limits Outcome: Adequate for Discharge Goal: Postoperative complications will be avoided or minimized Outcome: Adequate for Discharge   Problem: Respiratory: Goal: Will regain and/or maintain adequate ventilation Outcome: Adequate for Discharge Goal: Respiratory status will improve Outcome: Adequate for Discharge   Problem: Skin Integrity: Goal: Demonstrates signs of wound healing without infection Outcome: Adequate for Discharge   Problem: Urinary Elimination: Goal: Will remain free from infection Outcome: Adequate for Discharge Goal: Ability to achieve and maintain adequate urine output Outcome: Adequate for Discharge   "

## 2024-09-30 NOTE — TOC Transition Note (Signed)
 Transition of Care [facility]) - Discharge Note   Patient Details  Name: Brittany Mahoney MRN: [id number] Date of Birth: Nov 16, 1934  Transition of Care [facility]) CM/SW Contact:  Brittany Mahoney [name] Phone Number: 09/30/2024, 12:08 PM   Clinical Narrative:    Patient will DC to: [facility] Anticipated DC date: 10-01-23 Family notified: Yes Transport by: [facility]   Per MD patient ready for DC to [facility]. RN to call report prior to discharge [phone]. RN, patient, patient's family, and facility notified of DC. Discharge Summary and FL2 sent to facility. DC packet on chart. Ambulance transport requested for patient.   CSW will sign off for now as social work intervention is no longer needed. Please consult us  again if new needs arise.     Final next level of care: Skilled Nursing Facility Barriers to Discharge: Barriers Resolved   Patient Goals and CMS Choice Patient states their goals for this hospitalization and ongoing recovery are:: SNF          Discharge Placement   Existing PASRR number confirmed : 09/30/24          Patient chooses bed at: [facility] Patient to be transferred to facility by: [facility] Name of family member notified: Brittany Mahoney Patient and family notified of of transfer: 09/30/24  Discharge Plan and Services Additional resources added to the After Visit Summary for                                       Social Drivers of Health (SDOH) Interventions SDOH Screenings   Food Insecurity: Patient Unable To Answer (09/27/2024)  Housing: Unknown (09/27/2024)  Transportation Needs: Patient Unable To Answer (09/27/2024)  Utilities: Patient Unable To Answer (09/27/2024)  Alcohol Screen: Low Risk (10/23/2023)  Depression (PHQ2-9): Medium Risk (08/13/2024)  Financial Resource Strain: Low Risk (10/23/2023)  Physical Activity: Inactive (10/23/2023)  Social Connections: Patient Unable To Answer (09/27/2024)  Stress: No Stress Concern Present (10/23/2023)   Tobacco Use: Low Risk (09/27/2024)  Health Literacy: Adequate Health Literacy (10/23/2023)     Readmission Risk Interventions    03/12/2024   11:07 AM  Readmission Risk Prevention Plan  Post Dischage Appt Complete  Medication Screening Complete  Transportation Screening Complete

## 2024-09-30 NOTE — Discharge Summary (Signed)
 "  Physician Discharge Summary  Brittany Mahoney FMW:[id number] DOB: June 03, 1935 DOA: 09/26/2024  PCP: [name], MD  Admit date: 09/26/2024 Discharge date: 09/30/2024  Admitted From: Home  Discharge disposition: Skilled nursing facility   Recommendations for Outpatient Follow-Up:   Follow up with your primary care provider at the skilled nursing facility in 3 to 5 days. Check CBC, BMP, magnesium in the next visit Follow-up with orthopedics in 1 week for postoperative surgical follow-up  Patient will start aspirin  81 mg twice daily from 10/02/2024   Discharge Diagnosis:   Principal Problem:   Closed left femoral fracture [facility])  Discharge Condition: Improved.  Diet recommendation: Regular.  Wound care: None.  Code status:  DNR   History of Present Illness:   89 y.o. female with past medical history significant for recurrent UTIs, mild dementia, chronic bilateral low back pain without sciatica, presented to the ED after mechanical fall at home.  Vitals were stable.  CBC showed WBC at 13.0.  Hemoglobin of 9.3.  X-ray of the hips showed revealed left acute comminuted and displaced intertrochanteric fracture of the left femur with varus angulation.  UA positive for pyuria. Non-contrast head CT showing small lenticular shaped extra-axial hyperdense hemorrhage in the posterior right parietal region, possibly epidural or subdural measuring 5 mm in thickness without overlying fracture, mass effect, or midline shift..  ED provider discussed the case with neurosurgery recommended conservative management with observation   Hospital Course:   Following conditions were addressed during hospitalization as listed below,  Closed left femoral neck fracture, post mechanical fall Status post  IM nailing of left intertrochanteric hip fracture on 09/27/2024.  Further care as per orthopedics.  Continue PT OT evaluation.  Patient is weightbearing as tolerated plan for skilled nursing facility  placement at this time.  Still complains of pain in the surgical site.  Patient will start aspirin  81 mg twice daily from 10/02/2024 as per neurosurgery recommendation   Presumed urinary tract infection, POA Completed 3-day course of Rocephin .  Off Foley catheter.   Stable small right parietal 4 mm extraaxial hemorrhage  Neurosurgery was consulted during hospitalization and CT head scan was repeated which remained stable so patient was considered stable for orthopedic surgery.   Hemoglobin of 7.7 from initial  9.3.  Denies any headache nausea vomit.   Alzheimer dementia Continue delirium precautions.  Fall precautions.   Disposition.  At this time, patient is stable for disposition to skilled nursing facility with outpatient orthopedics follow-up  Medical Consultants:   Orthopedics Neurosurgery  Procedures:    IM nailing of left intertrochanteric hip fracture on 09/27/2024 Subjective:   Today, patient was seen and examined at bedside. Patient complains of mild pain over the left hip especially on movement.  Denies shortness of breath dyspnea chest pain fever chills or rigor.   Discharge Exam:   Vitals:   09/30/24 0451 09/30/24 1028  BP: (!) 119/57 (!) 120/47  Pulse: 98 95  Resp:  18  Temp: 98 F (36.7 C) 98.1 F (36.7 C)  SpO2: 95% 99%   Vitals:   09/29/24 1627 09/29/24 1932 09/30/24 0451 09/30/24 1028  BP: 119/60 (!) 113/54 (!) 119/57 (!) 120/47  Pulse: (!) 108 (!) 106 98 95  Resp:  17  18  Temp: 98.1 F (36.7 C) 98.1 F (36.7 C) 98 F (36.7 C) 98.1 F (36.7 C)  TempSrc: Oral Oral Oral Oral  SpO2: 99% 99% 95% 99%  Weight:      Height:  General:  Average built, not in obvious distress, Communicative, elderly female, HENT:   No scleral pallor or icterus noted. Oral mucosa is moist.  Chest:  Clear breath sounds.  . No crackles or wheezes.  CVS: S1 &S2 heard. No murmur.  Regular rate and rhythm. Abdomen: Soft, nontender, nondistended.  Bowel sounds are heard.    Extremities: No cyanosis, clubbing or edema.  Peripheral pulses are palpable.  Surgical dressing over the left hip with some tenderness on palpation. Psych: Alert, awake and oriented, normal mood CNS:  No cranial nerve deficits.  Moves extremities Skin: Warm and dry.  Dressing in place  The results of significant diagnostics from this hospitalization (including imaging, microbiology, ancillary and laboratory) are listed below for reference.     Diagnostic Studies:   DG FEMUR MIN 2 VIEWS LEFT Result Date: 09/27/2024 CLINICAL DATA:  Elective surgery. EXAM: LEFT FEMUR 2 VIEWS COMPARISON:  Radiograph dated 09/26/2024. FINDINGS: Multiple intraoperative fluoroscopic spot images of the left hip provided. The total fluoroscopic time is 163.5 seconds with a cumulative air Karma of 37.7 mGy. Status post ORIF of left femoral neck fracture. IMPRESSION: Status post ORIF of left femoral neck fracture. Electronically Signed   By: Vanetta Chou M.D.   On: 09/27/2024 17:32   DG C-Arm 1-60 Min-No Report Result Date: 09/27/2024 Fluoroscopy was utilized by the requesting physician.  No radiographic interpretation.   DG C-Arm 1-60 Min-No Report Result Date: 09/27/2024 Fluoroscopy was utilized by the requesting physician.  No radiographic interpretation.   CT HEAD WO CONTRAST ( ) Result Date: 09/27/2024 EXAM: CT HEAD WITHOUT CONTRAST 09/27/2024 05:09:00 AM TECHNIQUE: CT of the head was performed without the administration of intravenous contrast. Automated exposure control, iterative reconstruction, and/or weight based adjustment of the mA/kV was utilized to reduce the radiation dose to as low as reasonably achievable. COMPARISON: 09/26/2024 CLINICAL HISTORY: Head trauma, minor (Age >= 65y) Head trauma, minor (Age >= 65y) Head trauma, minor (Age >= 65y) FINDINGS: BRAIN AND VENTRICLES: Stable small right parietal 4 mm extraaxial hemorrhage. Prominence of sulci and ventricles compatible with cerebral volume loss.  Nonspecific hypoattenuation within periventricular and subcortical white matter, likely representing sequela of chronic microvascular ischemic change. No evidence of acute infarct. No mass effect or midline shift. ORBITS: No acute abnormality. SINUSES: Right mastoid effusion. SOFT TISSUES AND SKULL: No acute soft tissue abnormality. No skull fracture. IMPRESSION: 1. Stable small right parietal 4 mm extraaxial hemorrhage. 2. Prominence of sulci and ventricles compatible with cerebral volume loss. 3. Nonspecific hypoattenuation within periventricular and subcortical white matter, likely representing sequela of chronic microvascular ischemic change. Electronically signed by: Evalene Coho MD 09/27/2024 05:34 AM EST RP Workstation: [id number]   DG Chest Portable 1 View Result Date: 09/26/2024 EXAM: 1 VIEW(S) XRAY OF THE CHEST 09/26/2024 08:25:54 PM COMPARISON: None available. CLINICAL HISTORY: fall fall fall fall FINDINGS: LUNGS AND PLEURA: No focal pulmonary opacity. No pleural effusion. No pneumothorax. HEART AND MEDIASTINUM: No acute abnormality of the cardiac and mediastinal silhouettes. BONES AND SOFT TISSUES: No acute osseous abnormality. IMPRESSION: 1. No acute process. Electronically signed by: Greig Pique MD 09/26/2024 08:26 PM EST RP Workstation: [id number]   DG Shoulder Left Result Date: 09/26/2024 EXAM: 1 VIEW(S) XRAY OF THE LEFT SHOULDER 09/26/2024 06:37:08 PM COMPARISON: None available. CLINICAL HISTORY: fall fall fall FINDINGS: BONES AND JOINTS: Glenohumeral joint is normally aligned. No acute fracture. No malalignment. [facility]: Lambert Rhonda W joint is unremarkable. SOFT TISSUES: No abnormal calcifications. Visualized lung is unremarkable. IMPRESSION: 1. No evidence of acute injury. Electronically  signed by: Greig Pique MD 09/26/2024 06:51 PM EST RP Workstation: [id number]   DG Hip Unilat W or Wo Pelvis 2-3 Views Left Result Date: 09/26/2024 EXAM: 2 or 3 VIEW(S) XRAY OF THE BILATERAL HIP 09/26/2024 06:37:08  PM COMPARISON: 03/10/2024 CLINICAL HISTORY: fall fall fall fall fall FINDINGS: BONES AND JOINTS: Acute comminuted and displaced intertrochanteric fracture of the left femur with varus angulation. Partially visualized intramedullary nail fixation of the right femur in place. Old posttraumatic and postsurgical changes of the right femur. LUMBAR SPINE: Degenerative changes of the lower lumbar spine. SOFT TISSUES: Unremarkable. IMPRESSION: 1. Acute comminuted and displaced intertrochanteric fracture of the left femur with varus angulation. 2. Old posttraumatic and postsurgical changes of the right femur with partially visualized intramedullary nail fixation in place. Electronically signed by: Greig Pique MD 09/26/2024 06:51 PM EST RP Workstation: [id number]   CT Cervical Spine Wo Contrast Result Date: 09/26/2024 EXAM: CT CERVICAL SPINE WITHOUT CONTRAST 09/26/2024 06:16:37 PM TECHNIQUE: CT of the cervical spine was performed without the administration of intravenous contrast. Multiplanar reformatted images are provided for review. Automated exposure control, iterative reconstruction, and/or weight based adjustment of the mA/kV was utilized to reduce the radiation dose to as low as reasonably achievable. COMPARISON: 06/19/2024 CLINICAL HISTORY: Neck trauma (Age >= 65y) FINDINGS: BONES AND ALIGNMENT: No acute fracture. Slight anterolisthesis of C4 on C5 and retrolisthesis of C6 on C7. DEGENERATIVE CHANGES: Advanced degenerative disc disease from C4-C5 through C6-C7. Advanced bilateral degenerative facet disease diffusely. SOFT TISSUES: No prevertebral soft tissue swelling. IMPRESSION: 1. No evidence of acute traumatic injury. 2. Degenerative disc and facet disease as above. Electronically signed by: Franky Crease MD 09/26/2024 06:27 PM EST RP Workstation: [id number]   CT Head Wo Contrast Result Date: 09/26/2024 EXAM: CT HEAD WITHOUT CONTRAST 09/26/2024 06:16:37 PM TECHNIQUE: CT of the head was performed without the  administration of intravenous contrast. Automated exposure control, iterative reconstruction, and/or weight based adjustment of the mA/kV was utilized to reduce the radiation dose to as low as reasonably achievable. COMPARISON: 06/19/2024 CLINICAL HISTORY: Head trauma, minor (Age >= 65y) FINDINGS: BRAIN AND VENTRICLES: Small lenticular-shaped extraaxial hyperdense hemorrhage noted in the posterior right parietal region measuring 5 mm in thickness. This could reflect a small epidural or subdural hemorrhage. No mass effect or midline shift. Atrophy and chronic small vessel disease throughout the deep white matter. No evidence of acute infarct. No hydrocephalus. ORBITS: No acute abnormality. SINUSES: No acute abnormality. SOFT TISSUES AND SKULL: No acute soft tissue abnormality. No overlying fracture. IMPRESSION: 1. Small lenticular-shaped extraaxial hyperdense hemorrhage in the posterior right parietal region, possibly epidural or subdural, measuring 5 mm in thickness, without overlying fracture, mass effect, or midline shift. 2. These results were called to Dr. Birdena at the time of interpretation. Electronically signed by: Franky Crease MD 09/26/2024 06:25 PM EST RP Workstation: [id number]     Labs:   Basic Metabolic Panel: Recent Labs  Lab 09/26/24 1903 09/27/24 0524 09/28/24 0417 09/29/24 0443 09/30/24 0554  NA 141 140 137 138 138  K 3.8 3.9 4.1 3.9 3.8  CL 106 107 104 105 104  CO2 21* 21* 24 25 27   GLUCOSE 136* 123* 114* 107* 101*  BUN 19 21 20 19 14   CREATININE 1.00 0.80 0.90 0.87 0.68  CALCIUM 9.4 9.0 8.9 8.6* 8.4*  MG  --  2.0  --   --   --   PHOS  --  2.8  --   --   --    GFR Estimated  Creatinine Clearance: 46.4 mL/min (by C-G formula based on SCr of 0.68 mg/dL). Liver Function Tests: No results for input(s): AST, ALT, ALKPHOS, BILITOT, PROT, ALBUMIN in the last 168 hours. No results for input(s): LIPASE, AMYLASE in the last 168 hours. No results for input(s):  AMMONIA in the last 168 hours. Coagulation profile Recent Labs  Lab 09/26/24 1903  INR 1.0    CBC: Recent Labs  Lab 09/26/24 1903 09/27/24 0524 09/28/24 0417 09/29/24 0443 09/30/24 0554  WBC 15.7* 11.0* 13.0* 11.0* 8.9  NEUTROABS 13.4*  --   --   --   --   HGB 13.0 12.2 9.3* 8.3* 7.7*  HCT 39.6 35.6* 27.5* 25.0* 22.6*  MCV 93.0 91.5 92.9 93.3 93.4  PLT 263 233 189 180 173   Cardiac Enzymes: Recent Labs  Lab 09/26/24 1903  CKTOTAL 89   BNP: Invalid input(s): POCBNP CBG: No results for input(s): GLUCAP in the last 168 hours. D-Dimer No results for input(s): DDIMER in the last 72 hours. Hgb A1c No results for input(s): HGBA1C in the last 72 hours. Lipid Profile No results for input(s): CHOL, HDL, LDLCALC, TRIG, CHOLHDL, LDLDIRECT in the last 72 hours. Thyroid  function studies No results for input(s): TSH, T4TOTAL, T3FREE, THYROIDAB in the last 72 hours.  Invalid input(s): FREET3 Anemia work up No results for input(s): VITAMINB12, FOLATE, FERRITIN, TIBC, IRON, RETICCTPCT in the last 72 hours. Microbiology No results found for this or any previous visit (from the past 240 hours).   Discharge Instructions:   Discharge Instructions     Call MD for:  redness, tenderness, or signs of infection (pain, swelling, redness, odor or green/yellow discharge around incision site)   Complete by: As directed    Call MD for:  severe uncontrolled pain   Complete by: As directed    Call MD for:  temperature >100.4   Complete by: As directed    Diet general   Complete by: As directed    Discharge instructions   Complete by: As directed    Follow-up with your primary care provider at the skilled nursing facility in 3 to 5 days.  Check blood work at that time.  Follow-up with orthopedics for hip surgery follow-up in 1 week.  weightbearing as tolerated using a walker.  Leave Aquacel in place until postop visit.  Okay to change with  regular dry gauze if needed   Increase activity slowly   Complete by: As directed    No wound care   Complete by: As directed       Allergies as of 09/30/2024       Reactions   Penicillins Hives, Swelling   Has patient had a PCN reaction causing immediate rash, facial/tongue/throat swelling, SOB or lightheadedness with hypotension: Yes Has patient had a PCN reaction causing severe rash involving mucus membranes or skin necrosis: No Has patient had a PCN reaction that required hospitalization No Has patient had a PCN reaction occurring within the last 10 years: No  If all of the above answers are NO, then may proceed with Cephalosporin use.   Codeine    Erythromycin    Morphine  And Codeine Other (See Comments)   loopy        Medication List     STOP taking these medications    acetaminophen  500 MG tablet Commonly known as: TYLENOL    naproxen  500 MG tablet Commonly known as: NAPROSYN        TAKE these medications    aspirin  EC 81 MG  tablet Take 1 tablet (81 mg total) by mouth 2 (two) times daily for 28 days. Swallow whole.   docusate sodium  100 MG capsule Commonly known as: COLACE Take 1 capsule (100 mg total) by mouth 2 (two) times daily.   feeding supplement Liqd Take 237 mLs by mouth 2 (two) times daily between meals for 14 days.   latanoprost  0.005 % ophthalmic solution Commonly known as: XALATAN  Place 1 drop into both eyes daily.   Lumigan  0.01 % Soln Generic drug: bimatoprost  Place 1 drop into both eyes at bedtime.   oxyCODONE  5 MG immediate release tablet Commonly known as: Oxy IR/ROXICODONE  Take 1-2 tablets (5-10 mg total) by mouth every 4 (four) hours as needed for moderate pain (pain score 4-6) or breakthrough pain. IMMEDIATE RELEASE - OxyIR   polyethylene glycol 17 g packet Commonly known as: MIRALAX  / GLYCOLAX  Take 17 g by mouth daily as needed for severe constipation (not helped with stool softner).        Contact information for  follow-up providers     Germaine Redbird, MD Follow up in 1 week(s).   Specialty: Sports Medicine Why: post hip surgery followup Contact information: [address] [place] [address] [phone]              Contact information for after-discharge care     Destination     [facility] .   Service: Skilled Nursing Contact information: [address] [place]  [address] [phone]                      Time coordinating discharge: 39 minutes  Signed:  Caidon Foti  [facility] 09/30/2024, 11:31 AM          "

## 2024-10-01 ENCOUNTER — Other Ambulatory Visit: Payer: Self-pay | Admitting: Adult Health

## 2024-10-01 ENCOUNTER — Non-Acute Institutional Stay (SKILLED_NURSING_FACILITY): Payer: Self-pay | Admitting: Adult Health

## 2024-10-01 DIAGNOSIS — H40053 Ocular hypertension, bilateral: Secondary | ICD-10-CM

## 2024-10-01 DIAGNOSIS — G309 Alzheimer's disease, unspecified: Secondary | ICD-10-CM

## 2024-10-01 DIAGNOSIS — S72002A Fracture of unspecified part of neck of left femur, initial encounter for closed fracture: Secondary | ICD-10-CM | POA: Insufficient documentation

## 2024-10-01 DIAGNOSIS — F02B18 Dementia in other diseases classified elsewhere, moderate, with other behavioral disturbance: Secondary | ICD-10-CM

## 2024-10-01 DIAGNOSIS — S72002S Fracture of unspecified part of neck of left femur, sequela: Secondary | ICD-10-CM | POA: Diagnosis not present

## 2024-10-01 MED ORDER — OXYCODONE HCL 5 MG PO TABS: 5.0000 mg | ORAL_TABLET | ORAL | 0 refills | Status: AC | PRN

## 2024-10-01 NOTE — Addendum Note (Signed)
 Addended by: LANDY BARNIE RAMAN on: 10/01/2024 11:39 AM   Modules accepted: Orders

## 2024-10-01 NOTE — Progress Notes (Addendum)
 " Location:  [facility] Room Number: 132 Place of Service:  SNF (31)   CODE STATUS: dnr   Allergies[1]  Chief Complaint  Patient presents with   Hospitalization Follow-up    HPI:  She is a 89 year old woman who has been hospitalized from 09-26-24 through 09-30-24. Her past medical history includes: recurrent uti; mild dementia; chronic low back pain bilateral without sciatica. She had a mechanical fall and suffered a left femoral neck fracture. She underwent a IM nail on 09-27-24. Her hgb has continued to decline with a d/c reading of 7.7. Her ct of head demonstrated: small lenticular shaped extra-axial hyperdense hemorrhage in the posterior right parietal region, possibly epidural or subdural measuring 5 mm in thickness without overlying fracture, mass effect, or midline shift. Neurosurgery recommended observation. She had a presumed UTI as no culture was performed with rocephin  for 3 days. She is here for short term rehab with her goal to return back home. She will need follow up blood work in the next week. She will continue to be followed for her chronic illnesses including: Moderate dementia unspecified onset with other behavioral disturbance: Closed fracture of left hip sequela:  Increased intraocular pressure bilateral:    Past Medical History:  Diagnosis Date   Age-related osteoporosis without current pathological fracture    Arthritis    Arthropathy, unspecified    Broken hip (HCC)    Cancer (HCC)    uterus   Chronic kidney disease, stage 1    Closed right hip fracture (HCC) 04/09/2016   Endometrial cancer, grade I (HCC) 03/21/2015   Fracture of unspecified part of neck of right femur, sequela    Glaucoma    Headache(784.0)    migraines after periods   Hepatitis    Hx of cancer of endometrium 03/06/2013   Osteoporosis    Other abnormalities of gait and mobility    Other amnesia    Pain in left knee    Pain in right foot    Pain in right shoulder     Personal history of (healed) traumatic fracture    PMB (postmenopausal bleeding)    Repeated falls    Tremor, unspecified     Past Surgical History:  Procedure Laterality Date   ABDOMINAL HYSTERECTOMY  2007   TAH and BSO   COLONOSCOPY  07/31/2006   Dr.Rourk   DILATION AND CURETTAGE OF UTERUS     HIP FRACTURE SURGERY     INTRAMEDULLARY (IM) NAIL INTERTROCHANTERIC Right 04/10/2016   Procedure: INTRAMEDULLARY (IM) NAIL RIGHT HIP FRACTURE;  Surgeon: Lonni CINDERELLA Poli, MD;  Location: [facility] OR;  Service: Orthopedics;  Laterality: Right;   INTRAMEDULLARY (IM) NAIL INTERTROCHANTERIC Left 09/27/2024   Procedure: LEFT HIP FIXATION, FRACTURE, INTERTROCHANTERIC, WITH INTRAMEDULLARY ROD;  Surgeon: Germaine Redbird, MD;  Location: [facility] OR;  Service: Orthopedics;  Laterality: Left;    Social History   Socioeconomic History   Marital status: Widowed    Spouse name: Not on file   Number of children: Not on file   Years of education: Not on file   Highest education level: Not on file  Occupational History   Not on file  Tobacco Use   Smoking status: Never   Smokeless tobacco: Never  Vaping Use   Vaping status: Never Used  Substance and Sexual Activity   Alcohol use: No   Drug use: No   Sexual activity: Not Currently    Birth control/protection: Surgical  Other Topics Concern   Not  on file  Social History Narrative   Widow since 2016,married for 55 years.Lives with daughter.Airline Pilot for [employer] ,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   Social Drivers of Health   Tobacco Use: Low Risk (09/27/2024)   Patient History    Smoking Tobacco Use: Never    Smokeless Tobacco Use: Never    Passive Exposure: Not on file  Financial Resource Strain: Low Risk (10/23/2023)   Overall Financial Resource Strain (CARDIA)    Difficulty of Paying Living Expenses: Not hard at all  Food Insecurity: Patient Unable To Answer (09/27/2024)   Epic    Worried About Programme Researcher, Broadcasting/film/video in the Last Year: Patient  unable to answer    Ran Out of Food in the Last Year: Patient unable to answer  Transportation Needs: Patient Unable To Answer (09/27/2024)   Epic    Lack of Transportation (Medical): Patient unable to answer    Lack of Transportation (Non-Medical): Patient unable to answer  Physical Activity: Inactive (10/23/2023)   Exercise Vital Sign    Days of Exercise per Week: 0 days    Minutes of Exercise per Session: 0 min  Stress: No Stress Concern Present (10/23/2023)   [employer] of Occupational Health - Occupational Stress Questionnaire    Feeling of Stress : Not at all  Social Connections: Patient Unable To Answer (09/27/2024)   Social Connection and Isolation Panel    Frequency of Communication with Friends and Family: Patient unable to answer    Frequency of Social Gatherings with Friends and Family: Patient unable to answer    Attends Religious Services: Patient unable to answer    Active Member of Clubs or Organizations: Patient unable to answer    Attends Banker Meetings: Patient unable to answer    Marital Status: Patient unable to answer  Intimate Partner Violence: Patient Unable To Answer (09/27/2024)   Epic    Fear of Current or Ex-Partner: Patient unable to answer    Emotionally Abused: Patient unable to answer    Physically Abused: Patient unable to answer    Sexually Abused: Patient unable to answer  Depression (PHQ2-9): Medium Risk (08/13/2024)   Depression (PHQ2-9)    PHQ-2 Score: 6  Alcohol Screen: Low Risk (10/23/2023)   Alcohol Screen    Last Alcohol Screening Score (AUDIT): 0  Housing: Unknown (09/27/2024)   Epic    Unable to Pay for Housing in the Last Year: Patient unable to answer    Number of Times Moved in the Last Year: 0    Homeless in the Last Year: Patient unable to answer  Utilities: Patient Unable To Answer (09/27/2024)   Epic    Threatened with loss of utilities: Patient unable to answer  Health Literacy: Adequate Health Literacy  (10/23/2023)   B1300 Health Literacy    Frequency of need for help with medical instructions: Never   Family History  Problem Relation Age of Onset   Stroke Maternal Grandfather    Stroke Paternal Grandfather    Cancer Mother    Cancer Father        lymphatic sarcoma   Early death Sister    Early death Son    Diabetes Son    Heart disease Other    Cancer Other    Tuberculosis Other    Diabetes Other    Thyroid  disease Other    Stroke Other    Heart disease Maternal Aunt    Heart disease Maternal Uncle  VITAL SIGNS BP (!) 109/58   Pulse 96   Temp 98 F (36.7 C)   Resp 20   Ht 5' 6 (1.676 m)   Wt 141 lb 11.2 oz (64.3 kg)   SpO2 96%   BMI 22.87 kg/m   Outpatient Encounter Medications as of 10/01/2024  Medication Sig   [START ON 10/02/2024] aspirin  EC 81 MG tablet Take 1 tablet (81 mg total) by mouth 2 (two) times daily for 28 days. Swallow whole.   docusate sodium  (COLACE) 100 MG capsule Take 1 capsule (100 mg total) by mouth 2 (two) times daily.   feeding supplement (ENSURE PLUS HIGH PROTEIN) LIQD Take 237 mLs by mouth 2 (two) times daily between meals for 14 days.   latanoprost  (XALATAN ) 0.005 % ophthalmic solution Place 1 drop into both eyes daily.   LUMIGAN  0.01 % SOLN Place 1 drop into both eyes at bedtime.   oxyCODONE  (OXY IR/ROXICODONE ) 5 MG immediate release tablet Take 1 tablet (5 mg total) by mouth every 4 (four) hours as needed for breakthrough pain or severe pain (pain score 7-10). IMMEDIATE RELEASE - OxyIR   polyethylene glycol (MIRALAX  / GLYCOLAX ) 17 g packet Take 17 g by mouth daily as needed for severe constipation (not helped with stool softner).   No facility-administered encounter medications on file as of 10/01/2024.     SIGNIFICANT DIAGNOSTIC EXAMS  LABS  09-26-24: wbc 15.2; hgb 13.0; hct 39.6 mcv 93.0 plt 263; glucose 136; bun 19; creat 1.00; k+ 3.8; na++ 141; ca 9.4 gfr 54 09-29-24: wbc 10.0; hgb 8.3; hct 25.0; mcv 93.3 plt 180; glucose  107; bun 19; creat 0.87; k+ 3.9; na++ 138; ca 8.6 gfr >60 09-30-24; wbc 8.9; hgb 7.7; hct 22.6; mcv 93.4 plt 173   Review of Systems  Constitutional:  Negative for malaise/fatigue.  Respiratory:  Negative for cough and shortness of breath.   Cardiovascular:  Negative for chest pain, palpitations and leg swelling.  Gastrointestinal:  Negative for abdominal pain, constipation and heartburn.  Musculoskeletal:  Negative for back pain, joint pain and myalgias.  Skin: Negative.   Neurological:  Negative for dizziness.  Psychiatric/Behavioral:  The patient is not nervous/anxious.     Physical Exam Constitutional:      General: She is not in acute distress.    Appearance: She is well-developed. She is not diaphoretic.  Neck:     Thyroid : No thyromegaly.  Cardiovascular:     Rate and Rhythm: Normal rate and regular rhythm.     Heart sounds: Normal heart sounds.  Pulmonary:     Effort: Pulmonary effort is normal. No respiratory distress.     Breath sounds: Normal breath sounds.  Abdominal:     General: Bowel sounds are normal. There is no distension.     Palpations: Abdomen is soft.     Tenderness: There is no abdominal tenderness.  Musculoskeletal:        General: Normal range of motion.     Cervical back: Neck supple.     Right lower leg: No edema.     Left lower leg: No edema.     Comments: Left femoral neck fracture s/p IM nail 09-27-24  Lymphadenopathy:     Cervical: No cervical adenopathy.  Skin:    General: Skin is warm and dry.  Neurological:     Mental Status: She is alert and oriented to person, place, and time.  Psychiatric:        Mood and Affect: Mood normal.  ASSESSMENT/ PLAN:  TODAY  Moderate dementia unspecified onset with other behavioral disturbance: weight is 141.7 pounds   2. Closed fracture of left hip sequela: will continue therapy to improve upon her level of independence with her adl care. Will follow up with orthopedics; will continue asa 81 mg  twice daily and oxycodone  5 mg every 6 hours as needed   3. Increased intraocular pressure bilateral: will continue latanoprost  to both eyes daily and lumigan  to both eyes nightly  4. ABLA: hgb 7.7 will begin iron three times weekly will repeat labs.    Barnie Seip NP [facility]  call [phone]      [1]  Allergies Allergen Reactions   Penicillins Hives and Swelling    Has patient had a PCN reaction causing immediate rash, facial/tongue/throat swelling, SOB or lightheadedness with hypotension: Yes Has patient had a PCN reaction causing severe rash involving mucus membranes or skin necrosis: No Has patient had a PCN reaction that required hospitalization No Has patient had a PCN reaction occurring within the last 10 years: No  If all of the above answers are NO, then may proceed with Cephalosporin use.    Codeine    Erythromycin    Morphine  And Codeine Other (See Comments)    loopy   "

## 2024-10-01 NOTE — Care Management Important Message (Signed)
 Important Message  Patient Details  Name: Brittany Mahoney MRN: [id number] Date of Birth: 01-12-1935   Important Message Given:  No     [name] 10/01/2024, 2:27 PM

## 2024-10-01 NOTE — Progress Notes (Signed)
 Patient discharged, Important Message mailed to patient.

## 2024-10-02 ENCOUNTER — Non-Acute Institutional Stay (SKILLED_NURSING_FACILITY): Payer: Self-pay | Admitting: Internal Medicine

## 2024-10-02 ENCOUNTER — Encounter: Payer: Self-pay | Admitting: Internal Medicine

## 2024-10-02 DIAGNOSIS — S7292XG Unspecified fracture of left femur, subsequent encounter for closed fracture with delayed healing: Secondary | ICD-10-CM | POA: Diagnosis not present

## 2024-10-02 DIAGNOSIS — N3000 Acute cystitis without hematuria: Secondary | ICD-10-CM | POA: Diagnosis not present

## 2024-10-02 DIAGNOSIS — F02B18 Dementia in other diseases classified elsewhere, moderate, with other behavioral disturbance: Secondary | ICD-10-CM

## 2024-10-02 DIAGNOSIS — D62 Acute posthemorrhagic anemia: Secondary | ICD-10-CM

## 2024-10-02 DIAGNOSIS — G309 Alzheimer's disease, unspecified: Secondary | ICD-10-CM | POA: Diagnosis not present

## 2024-10-02 DIAGNOSIS — F330 Major depressive disorder, recurrent, mild: Secondary | ICD-10-CM | POA: Diagnosis not present

## 2024-10-02 NOTE — Assessment & Plan Note (Signed)
 Monitor for bleeding dyscrasias in the context of low-dose twice daily aspirin  as DVT prophylaxis.  Ferrous sulfate 325 mg 3 times a week.

## 2024-10-02 NOTE — Progress Notes (Unsigned)
 "   NURSING HOME LOCATION:  [facility] ROOM NUMBER: [id number]  CODE STATUS: DNR  PCP: Maurene POUR. Tobie, MD  This is a comprehensive admission note to this SNFperformed on this date less than 30 days from date of admission. Included are preadmission medical/surgical history; reconciled medication list; family history; social history and comprehensive review of systems.  Corrections and additions to the records were documented. Comprehensive physical exam was also performed. Additionally a clinical summary was entered for each active diagnosis pertinent to this admission in the Problem List to enhance continuity of care.  HPI: Patient was hospitalized 1/17 - 09/30/2024 with closed left femoral fracture sustained in mechanical fall.  Imaging revealed left acute comminuted and displaced intertrochanteric fracture of the left femur with varus angulation. IM nailing of the hip fracture was completed 1/18.  WBAT was recommended with 81 mg aspirin  twice daily as DVT prophylaxis. CT of the head without contrast revealed small lenticular shaped extra-axial hyperdense hemorrhage in the posterior right parietal region, possibly epidural vs subdural, measuring 5 mm in thickness.  There was no associated overlying fracture, mass effect, midline shift.  Neurosurgery was consulted and recommended conservative management with monitor. Urinalysis was positive for pyuria.  No urine culture was collected; empirically she received a 3-day course of Rocephin .  Foley which had been placed was able to be removed. Initially she exhibited a leukocytosis with a white count of 15,700 but this normalized and was 8900 at discharge.  Preop H/H was 13/39.6; nadir values were found at discharge with values of 7.7/22.6. Mild hyperglycemia was present with glucoses ranging from 101 up to 136.  Mild hypocalcemia was present with a calcium of 8.4.  Initial GFR was 54 but this improved to a value of greater than 60  indicating CKD stage II. Pt/PT recommended SNF placement for rehab.  Past medical and surgical history: Includes osteoporosis; history of uterine cancer; glaucoma; history of hepatitis; history recurrent UTIs; and mild dementia. Surgical procedures include abdominal hysterectomy; colonoscopy; D&C; and right IM nailing in 2017. Family history: reviewed, non contributory due to advanced age.  Social history: Nondrinker; non-smoker.   Review of systems: Clinical neurocognitive deficits made validity of responses questionable , compromising ROS completion.  When asked what diagnoses had been made during her hospitalization ,her response was I do not really know.  She then went on to say that it was nice to meet you.  She had actually met me previously while her daughter was a resident here.  She added that she broke her right hip compound fracture 2-1/2 months ago. Right hip IM nailing was in 2017. In reference to current fracture, she stated that she was standing in her sunroom and turned and passed out.  She believes she may have had vertigo but no other neurologic or cardiac prodrome.  She states that the hip is still off awfully sore.  She then went on to say that she had fractured the other one 1 month prior to her daughter's death.  She describes slight constipation.  She admits to a little depression as she is the only one left in my family. She does validate bruising easily.  She validates cold intolerance.  Constitutional: No fever, significant weight change, fatigue  Eyes: No redness, discharge, pain, vision change ENT/mouth: No nasal congestion, purulent discharge, earache, change in hearing, sore throat  Cardiovascular: No chest pain, palpitations, paroxysmal nocturnal dyspnea, claudication, edema  Respiratory: No cough, sputum production, hemoptysis, DOE, significant snoring, apnea  Gastrointestinal: No heartburn, dysphagia, abdominal pain, nausea /vomiting, rectal bleeding,  melena Genitourinary: No dysuria, hematuria, pyuria, incontinence, nocturia Dermatologic: No rash, pruritus, change in appearance of skin Neurologic: No dizziness, headache, seizures, numbness, tingling Psychiatric: No insomnia, anorexia Endocrine: No change in hair/skin/nails, excessive thirst, excessive hunger, excessive urination  Hematologic/lymphatic: No lymphadenopathy, abnormal bleeding Allergy/immunology: No itchy/watery eyes, significant sneezing, urticaria, angioedema  Physical exam:  Pertinent or positive findings: There is faint erythema of the upper and lower lids especially of the right upper lid medially w/o clinical cellulitis.  There is ptosis , greater on the left compared to the right.  There is a small ecchymotic lesion over the lower lip.  Slight tachycardia is noted.  Trace edema is noted at the sock line.  Pedal pulses are not palpable.  She has marked osteoarthritic changes of the fingers.  Interosseous wasting is present.  She has scattered bruising of the left forearm.  General appearance: no acute distress, increased work of breathing is present.   Lymphatic: No lymphadenopathy about the head, neck, axilla. Eyes: No conjunctival inflammation or lid edema is present. There is no scleral icterus. Ears:  External ear exam shows no significant lesions or deformities.   Nose:  External nasal examination shows no deformity or inflammation. Nasal mucosa are pink and moist without lesions, exudates Oral exam: Lips and gums are healthy appearing.There is no oropharyngeal erythema or exudate. Neck:  No thyromegaly, masses, tenderness noted.    Heart:  No murmur, click, rub.  Lungs: Chest clear to auscultation without wheezes, rhonchi, rales, rubs. Abdomen: Bowel sounds are normal.  Abdomen is soft and nontender with no organomegaly, hernias, masses. GU: Deferred  Extremities:  No cyanosis, clubbing. Neurologic exam: Balance, Rhomberg, finger to nose testing could not be  completed due to clinical state Skin: Warm & dry w/o tenting. No significant rash.  See clinical summary under each active problem in the Problem List with associated updated therapeutic plan :  Closed left femoral fracture [facility]) Orthopedic follow-up as scheduled.  PT/OT at SNF as tolerated.  Acute blood loss as cause of postoperative anemia Monitor for bleeding dyscrasias in the context of low-dose twice daily aspirin  as DVT prophylaxis.  Ferrous sulfate 325 mg 3 times a week.  UTI (urinary tract infection) CNS will be completed should she have symptoms of UTI.  Current mild episode of major depressive disorder She validates depression in the context of losing her daughter.  She made a statement I am the only one of my family left.  If symptoms progress; psychotropic medication adjustment will be assessed.  Dementia (HCC) with other behavioral disturbance She made the statement that she had broken her right hip as a compound fracture 2-1/2 months ago. It was actually in 2017.  No behavioral issues reported.  Continue to monitor.  "

## 2024-10-02 NOTE — Assessment & Plan Note (Addendum)
 She validates depression in the context of losing her daughter.  She made a statement I am the only one of my family left.  If symptoms progress; psychotropic medication adjustment will be assessed.

## 2024-10-02 NOTE — Assessment & Plan Note (Signed)
Orthopedic follow-up as scheduled.  PT/OT at SNF as tolerated.

## 2024-10-02 NOTE — Patient Instructions (Signed)
 See assessment and plan under each diagnosis in the problem list and acutely for this visit

## 2024-10-02 NOTE — Assessment & Plan Note (Signed)
 CNS will be completed should she have symptoms of UTI.

## 2024-10-02 NOTE — Assessment & Plan Note (Addendum)
 She made the statement that she had broken her right hip as a compound fracture 2-1/2 months ago. It was actually in 2017.  No behavioral issues reported.  Continue to monitor.

## 2024-10-05 ENCOUNTER — Other Ambulatory Visit (HOSPITAL_COMMUNITY)
Admission: RE | Admit: 2024-10-05 | Discharge: 2024-10-05 | Disposition: A | Discharge status: Home / Self Care | Source: Skilled Nursing Facility | Attending: Adult Health | Admitting: Adult Health

## 2024-10-05 DIAGNOSIS — S72142D Displaced intertrochanteric fracture of left femur, subsequent encounter for closed fracture with routine healing: Secondary | ICD-10-CM | POA: Insufficient documentation

## 2024-10-05 LAB — CBC
HCT: 25.3 % — ABNORMAL LOW (ref 36.0–46.0)
Hemoglobin: 8.1 g/dL — ABNORMAL LOW (ref 12.0–15.0)
MCH: 30.8 pg (ref 26.0–34.0)
MCHC: 32 g/dL (ref 30.0–36.0)
MCV: 96.2 fL (ref 80.0–100.0)
Platelets: 404 10*3/uL — ABNORMAL HIGH (ref 150–400)
RBC: 2.63 MIL/uL — ABNORMAL LOW (ref 3.87–5.11)
RDW: 13.3 % (ref 11.5–15.5)
WBC: 8.1 10*3/uL (ref 4.0–10.5)
nRBC: 0 % (ref 0.0–0.2)

## 2024-10-05 LAB — COMPREHENSIVE METABOLIC PANEL WITH GFR
ALT: 13 U/L (ref 0–44)
AST: 14 U/L — ABNORMAL LOW (ref 15–41)
Albumin: 3.1 g/dL — ABNORMAL LOW (ref 3.5–5.0)
Alkaline Phosphatase: 90 U/L (ref 38–126)
Anion gap: 9 (ref 5–15)
BUN: 12 mg/dL (ref 8–23)
CO2: 26 mmol/L (ref 22–32)
Calcium: 8.4 mg/dL — ABNORMAL LOW (ref 8.9–10.3)
Chloride: 107 mmol/L (ref 98–111)
Creatinine, Ser: 0.7 mg/dL (ref 0.44–1.00)
GFR, Estimated: >60 mL/min
Glucose, Bld: 85 mg/dL (ref 70–99)
Potassium: 3.6 mmol/L (ref 3.5–5.1)
Sodium: 142 mmol/L (ref 135–145)
Total Bilirubin: 1.6 mg/dL — ABNORMAL HIGH (ref 0.0–1.2)
Total Protein: 5.3 g/dL — ABNORMAL LOW (ref 6.5–8.1)

## 2024-10-05 LAB — MAGNESIUM: Magnesium: 2.3 mg/dL (ref 1.7–2.4)

## 2024-10-16 ENCOUNTER — Encounter: Payer: Self-pay | Admitting: Adult Health

## 2024-10-16 ENCOUNTER — Non-Acute Institutional Stay: Payer: Self-pay | Admitting: Adult Health

## 2024-10-16 DIAGNOSIS — S72002D Fracture of unspecified part of neck of left femur, subsequent encounter for closed fracture with routine healing: Secondary | ICD-10-CM

## 2024-10-16 DIAGNOSIS — D62 Acute posthemorrhagic anemia: Secondary | ICD-10-CM

## 2024-10-16 DIAGNOSIS — G309 Alzheimer's disease, unspecified: Secondary | ICD-10-CM

## 2024-10-16 NOTE — Progress Notes (Signed)
 " Location:  [facility] Room Number: 132 P Place of Service:  SNF (31)   CODE STATUS: DNR  Allergies[1]  Chief Complaint  Patient presents with   Care Plan Meeting    HPI:  We have come together for her care plan meeting. Family present BIMS 12/15 mood 6/30: not sleeping well; little energy SLUMS 20/30. Using wheelchair with no falls. She requires moderate to maximum assist with her adl care. She is frequently incontinent of bladder and bowel. Dietary: regular diet with supplements setup for meals; appetite 51-75%; weight is 146 pounds. Therapy; ambulate 60 feet with rolling walker and contact guard; upper body is setup lower boy: min to contact guard. BRP min to mod assist. She will continue to be followed for her chronic illnesses including: Closed left hip fracture with routine healing subsequent encounter    Moderate alzheimer's disease with other behavioral disturbance unspecified onset    Acute blood loss anemia as a cause of postoperative anemia   Past Medical History:  Diagnosis Date   Age-related osteoporosis without current pathological fracture    Arthritis    Arthropathy, unspecified    Broken hip (HCC)    Cancer (HCC)    uterus   Chronic kidney disease, stage 1    Closed right hip fracture (HCC) 04/09/2016   Endometrial cancer, grade I (HCC) 03/21/2015   Fracture of unspecified part of neck of right femur, sequela    Glaucoma    Headache(784.0)    migraines after periods   Hepatitis    Hx of cancer of endometrium 03/06/2013   Osteoporosis    Other abnormalities of gait and mobility    Other amnesia    Pain in left knee    Pain in right foot    Pain in right shoulder    Personal history of (healed) traumatic fracture    PMB (postmenopausal bleeding)    Repeated falls    Tremor, unspecified     Past Surgical History:  Procedure Laterality Date   ABDOMINAL HYSTERECTOMY  2007   TAH and BSO   COLONOSCOPY  07/31/2006   Dr.Rourk   DILATION  AND CURETTAGE OF UTERUS     HIP FRACTURE SURGERY     INTRAMEDULLARY (IM) NAIL INTERTROCHANTERIC Right 04/10/2016   Procedure: INTRAMEDULLARY (IM) NAIL RIGHT HIP FRACTURE;  Surgeon: Lonni CINDERELLA Poli, MD;  Location: [facility] OR;  Service: Orthopedics;  Laterality: Right;   INTRAMEDULLARY (IM) NAIL INTERTROCHANTERIC Left 09/27/2024   Procedure: LEFT HIP FIXATION, FRACTURE, INTERTROCHANTERIC, WITH INTRAMEDULLARY ROD;  Surgeon: Germaine Redbird, MD;  Location: [facility] OR;  Service: Orthopedics;  Laterality: Left;    Social History   Socioeconomic History   Marital status: Widowed    Spouse name: Not on file   Number of children: Not on file   Years of education: Not on file   Highest education level: Not on file  Occupational History   Not on file  Tobacco Use   Smoking status: Never   Smokeless tobacco: Never  Vaping Use   Vaping status: Never Used  Substance and Sexual Activity   Alcohol use: No   Drug use: No   Sexual activity: Not Currently    Birth control/protection: Surgical  Other Topics Concern   Not on file  Social History Narrative   Widow since 2016,married for 55 years.Lives with daughter.Airline Pilot for [employer] ,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   Social Drivers of Health   Tobacco Use: Low Risk (10/16/2024)   Patient  History    Smoking Tobacco Use: Never    Smokeless Tobacco Use: Never    Passive Exposure: Not on file  Financial Resource Strain: Low Risk (10/23/2023)   Overall Financial Resource Strain (CARDIA)    Difficulty of Paying Living Expenses: Not hard at all  Food Insecurity: Patient Unable To Answer (09/27/2024)   Epic    Worried About Programme Researcher, Broadcasting/film/video in the Last Year: Patient unable to answer    Ran Out of Food in the Last Year: Patient unable to answer  Transportation Needs: Patient Unable To Answer (09/27/2024)   Epic    Lack of Transportation (Medical): Patient unable to answer    Lack of Transportation (Non-Medical): Patient unable to answer   Physical Activity: Inactive (10/23/2023)   Exercise Vital Sign    Days of Exercise per Week: 0 days    Minutes of Exercise per Session: 0 min  Stress: No Stress Concern Present (10/23/2023)   [employer] of Occupational Health - Occupational Stress Questionnaire    Feeling of Stress : Not at all  Social Connections: Patient Unable To Answer (09/27/2024)   Social Connection and Isolation Panel    Frequency of Communication with Friends and Family: Patient unable to answer    Frequency of Social Gatherings with Friends and Family: Patient unable to answer    Attends Religious Services: Patient unable to answer    Active Member of Clubs or Organizations: Patient unable to answer    Attends Banker Meetings: Patient unable to answer    Marital Status: Patient unable to answer  Intimate Partner Violence: Patient Unable To Answer (09/27/2024)   Epic    Fear of Current or Ex-Partner: Patient unable to answer    Emotionally Abused: Patient unable to answer    Physically Abused: Patient unable to answer    Sexually Abused: Patient unable to answer  Depression (PHQ2-9): Medium Risk (08/13/2024)   Depression (PHQ2-9)    PHQ-2 Score: 6  Alcohol Screen: Low Risk (10/23/2023)   Alcohol Screen    Last Alcohol Screening Score (AUDIT): 0  Housing: Unknown (09/27/2024)   Epic    Unable to Pay for Housing in the Last Year: Patient unable to answer    Number of Times Moved in the Last Year: 0    Homeless in the Last Year: Patient unable to answer  Utilities: Patient Unable To Answer (09/27/2024)   Epic    Threatened with loss of utilities: Patient unable to answer  Health Literacy: Adequate Health Literacy (10/23/2023)   B1300 Health Literacy    Frequency of need for help with medical instructions: Never   Family History  Problem Relation Age of Onset   Stroke Maternal Grandfather    Stroke Paternal Grandfather    Cancer Mother    Cancer Father        lymphatic sarcoma   Early  death Sister    Early death Son    Diabetes Son    Heart disease Other    Cancer Other    Tuberculosis Other    Diabetes Other    Thyroid  disease Other    Stroke Other    Heart disease Maternal Aunt    Heart disease Maternal Uncle       VITAL SIGNS BP 126/65   Pulse 95   Temp 97.8 F (36.6 C)   Resp 20   Ht 5' 6 (1.676 m)   Wt 146 lb 4.8 oz (66.4 kg)   SpO2 99%  BMI 23.61 kg/m   Outpatient Encounter Medications as of 10/16/2024  Medication Sig   acetaminophen  (TYLENOL ) 325 MG tablet Take 650 mg by mouth every 6 (six) hours as needed. Special Instructions: Do NOT exceed 3000 MG acetaminophen  total daily from all sources. For pain. Every 6 Hours - PRN PRN 1, PRN 2, PRN 3, PRN 4   aspirin  EC 81 MG tablet Take 1 tablet (81 mg total) by mouth 2 (two) times daily for 28 days. Swallow whole.   ferrous sulfate 324 MG TBEC Take 324 mg by mouth 3 (three) times a week.   latanoprost  (XALATAN ) 0.005 % ophthalmic solution Place 1 drop into both eyes daily.   LUMIGAN  0.01 % SOLN Place 1 drop into both eyes at bedtime.   oxyCODONE  (OXY IR/ROXICODONE ) 5 MG immediate release tablet Take 1 tablet (5 mg total) by mouth every 4 (four) hours as needed for breakthrough pain or severe pain (pain score 7-10). IMMEDIATE RELEASE - OxyIR   polyethylene glycol (MIRALAX  / GLYCOLAX ) 17 g packet Take 17 g by mouth daily as needed for severe constipation (not helped with stool softner).   sennosides-docusate sodium  (SENOKOT-S) 8.6-50 MG tablet Take 1 tablet by mouth in the morning and at bedtime.   docusate sodium  (COLACE) 100 MG capsule Take 1 capsule (100 mg total) by mouth 2 (two) times daily. (Patient not taking: Reported on 10/16/2024)   No facility-administered encounter medications on file as of 10/16/2024.     SIGNIFICANT DIAGNOSTIC EXAMS  LABS  09-26-24: wbc 15.2; hgb 13.0; hct 39.6 mcv 93.0 plt 263; glucose 136; bun 19; creat 1.00; k+ 3.8; na++ 141; ca 9.4 gfr 54 09-29-24: wbc 10.0; hgb 8.3;  hct 25.0; mcv 93.3 plt 180; glucose 107; bun 19; creat 0.87; k+ 3.9; na++ 138; ca 8.6 gfr >60 09-30-24; wbc 8.9; hgb 7.7; hct 22.6; mcv 93.4 plt 173 10-05-24: wbc 8.1; hgb 8.1; hct 25.3; mcv 96.2 plt  404; glucose 85; bun 12; creat 0.70; k+ 3.6; na++ 142; ca 8.4; gfr >60; protein 5.3 albumin 3.1   Review of Systems  Constitutional:  Negative for malaise/fatigue.  Respiratory:  Negative for cough and shortness of breath.   Cardiovascular:  Negative for chest pain, palpitations and leg swelling.  Gastrointestinal:  Negative for abdominal pain, constipation and heartburn.  Musculoskeletal:  Negative for back pain, joint pain and myalgias.  Skin: Negative.   Neurological:  Negative for dizziness.  Psychiatric/Behavioral:  The patient is not nervous/anxious.     Physical Exam Constitutional:      General: She is not in acute distress.    Appearance: She is well-developed. She is not diaphoretic.  Neck:     Thyroid : No thyromegaly.  Cardiovascular:     Rate and Rhythm: Normal rate and regular rhythm.     Heart sounds: Normal heart sounds.  Pulmonary:     Effort: Pulmonary effort is normal. No respiratory distress.     Breath sounds: Normal breath sounds.  Abdominal:     General: Bowel sounds are normal. There is no distension.     Palpations: Abdomen is soft.     Tenderness: There is no abdominal tenderness.  Musculoskeletal:     Cervical back: Neck supple.     Right lower leg: No edema.     Left lower leg: No edema.     Comments:  Left femoral neck fracture s/p IM nail 09-27-24  Lymphadenopathy:     Cervical: No cervical adenopathy.  Skin:    General: Skin  is warm and dry.  Neurological:     Mental Status: She is alert. Mental status is at baseline.  Psychiatric:        Mood and Affect: Mood normal.     ASSESSMENT/ PLAN:  TODAY  Closed left hip fracture with routine healing subsequent encounter  Moderate alzheimer's disease with other behavioral disturbance unspecified  onset  Acute blood loss anemia as a cause of postoperative anemia   Will stop oxycodone  Will begin mobic 7.5 mg twice daily as needed Will check bmp cbc Will continue therapy as directed At this time the goal of her care is assisted living.   Time spent with patient: 40 minutes: medications; therapy; dietary.    Barnie Seip NP [facility]   call [phone]      [1]  Allergies Allergen Reactions   Penicillins Hives and Swelling    Has patient had a PCN reaction causing immediate rash, facial/tongue/throat swelling, SOB or lightheadedness with hypotension: Yes Has patient had a PCN reaction causing severe rash involving mucus membranes or skin necrosis: No Has patient had a PCN reaction that required hospitalization No Has patient had a PCN reaction occurring within the last 10 years: No  If all of the above answers are NO, then may proceed with Cephalosporin use.    Codeine    Erythromycin    Morphine  And Codeine Other (See Comments)    loopy   "

## 2024-10-26 ENCOUNTER — Ambulatory Visit: Payer: Medicare Other

## 2024-12-08 ENCOUNTER — Ambulatory Visit: Admitting: Podiatry

## 2025-01-06 ENCOUNTER — Ambulatory Visit: Admitting: Internal Medicine

## 2025-02-17 ENCOUNTER — Ambulatory Visit: Admitting: Podiatry
